# Patient Record
Sex: Female | Born: 1945
Health system: Southern US, Community
[De-identification: ages and names within clinical notes are randomized; demographics above are authoritative.]

## PROBLEM LIST (undated history)

## (undated) DIAGNOSIS — F29 Unspecified psychosis not due to a substance or known physiological condition: Secondary | ICD-10-CM

## (undated) DIAGNOSIS — M1990 Unspecified osteoarthritis, unspecified site: Secondary | ICD-10-CM

## (undated) DIAGNOSIS — E079 Disorder of thyroid, unspecified: Secondary | ICD-10-CM

## (undated) DIAGNOSIS — F32A Depression, unspecified: Secondary | ICD-10-CM

## (undated) DIAGNOSIS — K219 Gastro-esophageal reflux disease without esophagitis: Secondary | ICD-10-CM

## (undated) DIAGNOSIS — F03911 Unspecified dementia, unspecified severity, with agitation: Secondary | ICD-10-CM

## (undated) DIAGNOSIS — I10 Essential (primary) hypertension: Secondary | ICD-10-CM

## (undated) DIAGNOSIS — J302 Other seasonal allergic rhinitis: Secondary | ICD-10-CM

## (undated) DIAGNOSIS — F0390 Unspecified dementia without behavioral disturbance: Secondary | ICD-10-CM

## (undated) DIAGNOSIS — F419 Anxiety disorder, unspecified: Secondary | ICD-10-CM

## (undated) HISTORY — PX: ABDOMINAL HYSTERECTOMY: SHX81

---

## 2006-05-14 ENCOUNTER — Inpatient Hospital Stay (HOSPITAL_COMMUNITY): Admission: EM | Admit: 2006-05-14 | Discharge: 2006-05-17 | Payer: Self-pay | Admitting: Emergency Medicine

## 2009-02-09 ENCOUNTER — Ambulatory Visit (HOSPITAL_COMMUNITY): Admission: RE | Admit: 2009-02-09 | Discharge: 2009-02-09 | Payer: Self-pay | Admitting: Family Medicine

## 2009-02-11 ENCOUNTER — Ambulatory Visit (HOSPITAL_COMMUNITY): Admission: RE | Admit: 2009-02-11 | Discharge: 2009-02-11 | Payer: Self-pay | Admitting: Family Medicine

## 2010-04-11 ENCOUNTER — Emergency Department (HOSPITAL_COMMUNITY)
Admission: EM | Admit: 2010-04-11 | Discharge: 2010-04-11 | Payer: Self-pay | Source: Home / Self Care | Admitting: Emergency Medicine

## 2010-04-11 ENCOUNTER — Encounter: Payer: Self-pay | Admitting: Orthopedic Surgery

## 2010-04-13 ENCOUNTER — Ambulatory Visit
Admission: RE | Admit: 2010-04-13 | Discharge: 2010-04-13 | Payer: Self-pay | Source: Home / Self Care | Attending: Orthopedic Surgery | Admitting: Orthopedic Surgery

## 2010-04-13 ENCOUNTER — Encounter: Payer: Self-pay | Admitting: Orthopedic Surgery

## 2010-04-13 DIAGNOSIS — S42209A Unspecified fracture of upper end of unspecified humerus, initial encounter for closed fracture: Secondary | ICD-10-CM | POA: Insufficient documentation

## 2010-04-20 ENCOUNTER — Ambulatory Visit
Admission: RE | Admit: 2010-04-20 | Discharge: 2010-04-20 | Payer: Self-pay | Source: Home / Self Care | Attending: Orthopedic Surgery | Admitting: Orthopedic Surgery

## 2010-04-24 ENCOUNTER — Encounter: Payer: Self-pay | Admitting: Family Medicine

## 2010-04-28 ENCOUNTER — Ambulatory Visit
Admission: RE | Admit: 2010-04-28 | Discharge: 2010-04-28 | Payer: Self-pay | Source: Home / Self Care | Attending: Orthopedic Surgery | Admitting: Orthopedic Surgery

## 2010-04-28 ENCOUNTER — Encounter: Payer: Self-pay | Admitting: Orthopedic Surgery

## 2010-05-03 ENCOUNTER — Encounter (HOSPITAL_COMMUNITY)
Admission: RE | Admit: 2010-05-03 | Discharge: 2010-05-03 | Payer: Self-pay | Source: Home / Self Care | Attending: Orthopedic Surgery | Admitting: Orthopedic Surgery

## 2010-05-05 NOTE — Letter (Signed)
Summary: history form   history form   Imported By: Eugenio Hoes 04/13/2010 15:43:46  _____________________________________________________________________  External Attachment:    Type:   Image     Comment:   External Document

## 2010-05-05 NOTE — Medication Information (Signed)
Summary: Tax adviser   Imported By: Cammie Sickle 04/29/2010 10:39:08  _____________________________________________________________________  External Attachment:    Type:   Image     Comment:   External Document

## 2010-05-05 NOTE — Assessment & Plan Note (Signed)
Summary: 1 WK RECK LT SHOULDER XRAY/[facility]   Visit Type:  Follow-up Referring Provider:  [facility]  CC:  fx care shoulder.  History of Present Illness: I saw Paula Andrews in the office today for an initial visit.  She is a 65 years old woman with the complaint of:  left humerus fracture  DOI 04-11-10.  Xrays done at [facility] show left humeral neck and greater tuberosity fractures.  Medications:  Ibuprofen 600 mg, both given by the ER.  Today is one week recheck of the left shoulder with xrays.  Pain level around 2 with the medication.  Radiographs are taken today separately identifiable. X-ray report [facility], lateral, LEFT proximal humerus and proximal humerus fracture, which makes in the right ear for nonoperative treatment.  No change in position from previous x-ray.  Impression stable proximal humerus fracture.  Patient advised to continue sling. He'll come back in a week for x-rays if x-rays look good. Start physical therapy       Allergies: No Known Drug Allergies   Impression & Recommendations:  Problem # 1:  CLOSED FRACTURE UNSPEC PART UPPER END HUMERUS (ICD-812.00) Assessment Improved  Orders: Post-Op Check (16109) Shoulder x-ray,  minimum 2 views (60454)  Patient Instructions: 1)  Please schedule a follow-up appointment in 1 week. 2)  xrays in a week left shoulder    Orders Added: 1)  Post-Op Check [99024] 2)  Shoulder x-ray,  minimum 2 views [73030]

## 2010-05-05 NOTE — Assessment & Plan Note (Signed)
Summary: RE-CK/XRAY 1 WK LT SHOULDER/[facility]   Visit Type:  Follow-up Referring Provider:  [facility]  CC:  fx care left shoulder.  History of Present Illness: I saw Paula Andrews in the office today for a follow up visit.  She is a 65 years old woman with the complaint of:  left humerus fracture  DOI 04-11-10.  Xrays done at [facility] show left humeral neck and greater tuberosity fractures.  Medications:  Ibuprofen 600 mg  Today is one week recheck of the left shoulder with xrays after being in sling.  No pain other than with movement.  Separate and Identifiable X-Ray report      2 views, LEFT shoulder.  LEFT shoulder proximal humerus fracture.  Nondisplaced LEFT proximal humerus fractures in good position with excellent alignment.  Impression healing LEFT proximal humerus fracture, stable        Allergies (verified): No Known Drug Allergies   Impression & Recommendations:  Problem # 1:  CLOSED FRACTURE UNSPEC PART UPPER END HUMERUS (ICD-812.00) Assessment Improved  START PT   RETURN IN 6 WEEKS   Orders: Post-Op Check (14782) Shoulder x-ray,  minimum 2 views (95621)  Patient Instructions: 1)  START PT  2)  SLING AS NEEDED FOR PAIN CONTROL  3)  RETURN IN 6 WEEKS    Orders Added: 1)  Post-Op Check [99024] 2)  Shoulder x-ray,  minimum 2 views [73030]

## 2010-05-05 NOTE — Assessment & Plan Note (Signed)
Summary: [facility] FOL/UP/FX LT HUMERUS/XRAY [facility] 04/11/10/[facility]   Visit Type:  new patient Referring Provider:  [facility]  CC:  Left humeral fracture.  History of Present Illness: I saw Paula Andrews in the office today for an initial visit.  She is a 65 years old woman with the complaint of:  left humerus fracture  DOI 04-11-10.  Xrays done at [facility] show left humeral neck and greater tuberosity fractures.  Medications: Percocet 5 mg, Ibuprofen 600 mg, both given by the ER. She is scared to take the medication because the Morphine the hospital gave her made her sick.  Quality of pain, sharp, scale of 1-10 6. Timing intermittent onset secondary to injury. No specific improving factors, worse with movement. Other signs and symptoms of swelling and bruising  Allergies (verified): No Known Drug Allergies  Review of Systems Constitutional:  Denies weight loss, weight gain, fever, chills, and fatigue. Cardiovascular:  Denies chest pain, palpitations, fainting, and murmurs. Respiratory:  Complains of snoring; denies short of breath, wheezing, couch, tightness, pain on inspiration, and snoring . Gastrointestinal:  Denies heartburn, nausea, vomiting, diarrhea, constipation, and blood in your stools. Genitourinary:  Denies frequency, urgency, difficulty urinating, painful urination, flank pain, and bleeding in urine. Neurologic:  Denies numbness, tingling, unsteady gait, dizziness, tremors, and seizure. Musculoskeletal:  Complains of joint pain and swelling; denies instability, stiffness, redness, heat, and muscle pain. Endocrine:  Denies excessive thirst, exessive urination, and heat or cold intolerance. Psychiatric:  Complains of nervousness; denies depression, anxiety, and hallucinations. Skin:  Denies changes in the skin, poor healing, rash, itching, and redness. HEENT:  Denies blurred or double vision, eye pain, redness, and watering. Immunology:  Denies seasonal allergies, sinus problems,  and allergic to bee stings. Hemoatologic:  Denies easy bleeding and brusing.  Physical Exam  Additional Exam:  GEN: well developed, well nourished, normal grooming and hygiene, no deformity and normal body habitus.   CDV: pulses are normal, no edema, no erythema. no tenderness  Lymph: normal lymph nodes   Skin: no rashes, skin lesions or open sores   NEURO: normal coordination, reflexes, sensation.   Psyche: awake, alert and oriented. Mood normal   Gait: normal ambulation.  Inspection reveals normal hand function in terms of opening and closing the hand. Good capillary refill normal grip strength. The shoulder. Stability was deferred because of the fracture. She is tender in the proximal humeral area.  Her RIGHT upper extremity is normal with no malalignment normal range of motion muscle tone and no subluxation of the joint  Inspection    Impression & Recommendations:  Problem # 1:  CLOSED FRACTURE UNSPEC PART UPPER END HUMERUS (ICD-812.00)  The x-rays were done at [facility]. The report and the films have been reviewed. There is a proximal humerus fracture with a transverse fracture component, and a vertical fracture component of the greater tuberosity. Neither fracture fragment is greater than 1 cm displaced or 45 angulated. This meets the near criteria for nonoperative treatment.  The patient is encouraged to keep the sling on and keep the arm close to her body. No external rotation, abduction or forward elevation.  Return in one week repeat radiographs. If the radiographs show no change in the position of the fractured and open treatment internal fixation would be needed. This was explained  Orders: New Patient Level III (45409) Humeral Neck Fx (81191)  Patient Instructions: 1)  xrays left shoulder in 1 week [proximal humerus fracture]   Orders Added: 1)  New Patient  Level III [99203] 2)  Humeral Neck Fx [23600]

## 2010-05-05 NOTE — Miscellaneous (Signed)
Summary: Physical therapy order  Physical therapy order   Imported By: Cammie Sickle 04/29/2010 10:39:56  _____________________________________________________________________  External Attachment:    Type:   Image     Comment:   External Document

## 2010-05-06 ENCOUNTER — Ambulatory Visit (HOSPITAL_COMMUNITY)
Admission: RE | Admit: 2010-05-06 | Discharge: 2010-05-06 | Disposition: A | Discharge status: Home / Self Care | Payer: 59 | Source: Ambulatory Visit | Attending: Orthopedic Surgery | Admitting: Orthopedic Surgery

## 2010-05-06 DIAGNOSIS — M6281 Muscle weakness (generalized): Secondary | ICD-10-CM | POA: Insufficient documentation

## 2010-05-06 DIAGNOSIS — M25619 Stiffness of unspecified shoulder, not elsewhere classified: Secondary | ICD-10-CM | POA: Insufficient documentation

## 2010-05-06 DIAGNOSIS — M25629 Stiffness of unspecified elbow, not elsewhere classified: Secondary | ICD-10-CM | POA: Insufficient documentation

## 2010-05-06 DIAGNOSIS — IMO0001 Reserved for inherently not codable concepts without codable children: Secondary | ICD-10-CM | POA: Insufficient documentation

## 2010-05-06 DIAGNOSIS — M25519 Pain in unspecified shoulder: Secondary | ICD-10-CM | POA: Insufficient documentation

## 2010-05-09 ENCOUNTER — Ambulatory Visit (HOSPITAL_COMMUNITY)
Admission: RE | Admit: 2010-05-09 | Discharge: 2010-05-09 | Disposition: A | Discharge status: Home / Self Care | Payer: 59 | Source: Ambulatory Visit | Attending: Orthopedic Surgery | Admitting: Orthopedic Surgery

## 2010-05-09 DIAGNOSIS — M25519 Pain in unspecified shoulder: Secondary | ICD-10-CM | POA: Insufficient documentation

## 2010-05-09 DIAGNOSIS — M25629 Stiffness of unspecified elbow, not elsewhere classified: Secondary | ICD-10-CM | POA: Insufficient documentation

## 2010-05-09 DIAGNOSIS — M6281 Muscle weakness (generalized): Secondary | ICD-10-CM | POA: Insufficient documentation

## 2010-05-09 DIAGNOSIS — IMO0001 Reserved for inherently not codable concepts without codable children: Secondary | ICD-10-CM | POA: Insufficient documentation

## 2010-05-09 DIAGNOSIS — M25619 Stiffness of unspecified shoulder, not elsewhere classified: Secondary | ICD-10-CM | POA: Insufficient documentation

## 2010-05-11 ENCOUNTER — Ambulatory Visit (HOSPITAL_COMMUNITY)
Admission: RE | Admit: 2010-05-11 | Discharge: 2010-05-11 | Disposition: A | Discharge status: Home / Self Care | Payer: 59 | Source: Ambulatory Visit | Attending: Orthopedic Surgery | Admitting: Orthopedic Surgery

## 2010-05-11 DIAGNOSIS — M25529 Pain in unspecified elbow: Secondary | ICD-10-CM | POA: Insufficient documentation

## 2010-05-11 DIAGNOSIS — M25629 Stiffness of unspecified elbow, not elsewhere classified: Secondary | ICD-10-CM | POA: Insufficient documentation

## 2010-05-11 DIAGNOSIS — IMO0001 Reserved for inherently not codable concepts without codable children: Secondary | ICD-10-CM | POA: Insufficient documentation

## 2010-05-11 DIAGNOSIS — M25619 Stiffness of unspecified shoulder, not elsewhere classified: Secondary | ICD-10-CM | POA: Insufficient documentation

## 2010-05-11 DIAGNOSIS — M6281 Muscle weakness (generalized): Secondary | ICD-10-CM | POA: Insufficient documentation

## 2010-05-13 ENCOUNTER — Ambulatory Visit (HOSPITAL_COMMUNITY)
Admission: RE | Admit: 2010-05-13 | Discharge: 2010-05-13 | Disposition: A | Discharge status: Home / Self Care | Payer: 59 | Source: Ambulatory Visit | Admitting: Occupational Therapy

## 2010-05-16 ENCOUNTER — Encounter (HOSPITAL_COMMUNITY)
Admission: RE | Admit: 2010-05-16 | Discharge: 2010-05-16 | Disposition: A | Discharge status: Home / Self Care | Payer: 59 | Source: Ambulatory Visit | Attending: Orthopedic Surgery | Admitting: Orthopedic Surgery

## 2010-05-16 DIAGNOSIS — S42309D Unspecified fracture of shaft of humerus, unspecified arm, subsequent encounter for fracture with routine healing: Secondary | ICD-10-CM | POA: Insufficient documentation

## 2010-05-16 DIAGNOSIS — M6281 Muscle weakness (generalized): Secondary | ICD-10-CM | POA: Insufficient documentation

## 2010-05-16 DIAGNOSIS — IMO0001 Reserved for inherently not codable concepts without codable children: Secondary | ICD-10-CM | POA: Insufficient documentation

## 2010-05-16 DIAGNOSIS — M25519 Pain in unspecified shoulder: Secondary | ICD-10-CM | POA: Insufficient documentation

## 2010-05-18 ENCOUNTER — Ambulatory Visit (HOSPITAL_COMMUNITY)
Admission: RE | Admit: 2010-05-18 | Discharge: 2010-05-18 | Disposition: A | Discharge status: Home / Self Care | Payer: 59 | Source: Ambulatory Visit | Attending: Orthopedic Surgery | Admitting: Orthopedic Surgery

## 2010-05-20 ENCOUNTER — Ambulatory Visit (HOSPITAL_COMMUNITY)
Admission: RE | Admit: 2010-05-20 | Discharge: 2010-05-20 | Disposition: A | Discharge status: Home / Self Care | Payer: 59 | Source: Ambulatory Visit | Admitting: Specialist

## 2010-05-23 ENCOUNTER — Ambulatory Visit (HOSPITAL_COMMUNITY)
Admission: RE | Admit: 2010-05-23 | Discharge: 2010-05-23 | Disposition: A | Discharge status: Home / Self Care | Payer: 59 | Source: Ambulatory Visit | Attending: *Deleted | Admitting: *Deleted

## 2010-05-23 ENCOUNTER — Encounter: Payer: Self-pay | Admitting: Orthopedic Surgery

## 2010-05-25 ENCOUNTER — Ambulatory Visit (HOSPITAL_COMMUNITY)
Admission: RE | Admit: 2010-05-25 | Discharge: 2010-05-25 | Disposition: A | Discharge status: Home / Self Care | Payer: 59 | Source: Ambulatory Visit | Attending: *Deleted | Admitting: *Deleted

## 2010-05-27 ENCOUNTER — Ambulatory Visit (HOSPITAL_COMMUNITY)
Admission: RE | Admit: 2010-05-27 | Discharge: 2010-05-27 | Disposition: A | Discharge status: Home / Self Care | Payer: 59 | Source: Ambulatory Visit | Attending: *Deleted | Admitting: *Deleted

## 2010-05-30 ENCOUNTER — Ambulatory Visit (HOSPITAL_COMMUNITY)
Admission: RE | Admit: 2010-05-30 | Discharge: 2010-05-30 | Disposition: A | Discharge status: Home / Self Care | Payer: 59 | Source: Ambulatory Visit | Attending: *Deleted | Admitting: *Deleted

## 2010-05-31 NOTE — Miscellaneous (Signed)
Summary: OT clinical evaluation  OT clinical evaluation   Imported By: Jacklynn Ganong 05/24/2010 14:58:11  _____________________________________________________________________  External Attachment:    Type:   Image     Comment:   External Document

## 2010-06-01 ENCOUNTER — Encounter: Payer: Self-pay | Admitting: Orthopedic Surgery

## 2010-06-01 ENCOUNTER — Ambulatory Visit (HOSPITAL_COMMUNITY)
Admission: RE | Admit: 2010-06-01 | Discharge: 2010-06-01 | Disposition: A | Discharge status: Home / Self Care | Payer: 59 | Source: Ambulatory Visit | Attending: Orthopedic Surgery | Admitting: Orthopedic Surgery

## 2010-06-01 DIAGNOSIS — M6281 Muscle weakness (generalized): Secondary | ICD-10-CM | POA: Insufficient documentation

## 2010-06-01 DIAGNOSIS — IMO0001 Reserved for inherently not codable concepts without codable children: Secondary | ICD-10-CM | POA: Insufficient documentation

## 2010-06-01 DIAGNOSIS — M25619 Stiffness of unspecified shoulder, not elsewhere classified: Secondary | ICD-10-CM | POA: Insufficient documentation

## 2010-06-01 DIAGNOSIS — M25519 Pain in unspecified shoulder: Secondary | ICD-10-CM | POA: Insufficient documentation

## 2010-06-01 DIAGNOSIS — M25629 Stiffness of unspecified elbow, not elsewhere classified: Secondary | ICD-10-CM | POA: Insufficient documentation

## 2010-06-03 ENCOUNTER — Ambulatory Visit (HOSPITAL_COMMUNITY)
Admission: RE | Admit: 2010-06-03 | Discharge: 2010-06-03 | Disposition: A | Discharge status: Home / Self Care | Payer: 59 | Source: Ambulatory Visit | Attending: Orthopedic Surgery | Admitting: Orthopedic Surgery

## 2010-06-03 DIAGNOSIS — M25619 Stiffness of unspecified shoulder, not elsewhere classified: Secondary | ICD-10-CM | POA: Insufficient documentation

## 2010-06-03 DIAGNOSIS — M6281 Muscle weakness (generalized): Secondary | ICD-10-CM | POA: Insufficient documentation

## 2010-06-03 DIAGNOSIS — M25519 Pain in unspecified shoulder: Secondary | ICD-10-CM | POA: Insufficient documentation

## 2010-06-03 DIAGNOSIS — M25629 Stiffness of unspecified elbow, not elsewhere classified: Secondary | ICD-10-CM | POA: Insufficient documentation

## 2010-06-03 DIAGNOSIS — IMO0001 Reserved for inherently not codable concepts without codable children: Secondary | ICD-10-CM | POA: Insufficient documentation

## 2010-06-06 ENCOUNTER — Ambulatory Visit (HOSPITAL_COMMUNITY)
Admission: RE | Admit: 2010-06-06 | Discharge: 2010-06-06 | Disposition: A | Discharge status: Home / Self Care | Payer: 59 | Source: Ambulatory Visit | Attending: *Deleted | Admitting: *Deleted

## 2010-06-08 ENCOUNTER — Ambulatory Visit (HOSPITAL_COMMUNITY)
Admission: RE | Admit: 2010-06-08 | Discharge: 2010-06-08 | Disposition: A | Discharge status: Home / Self Care | Payer: 59 | Source: Ambulatory Visit | Attending: Orthopedic Surgery | Admitting: Orthopedic Surgery

## 2010-06-10 ENCOUNTER — Ambulatory Visit (HOSPITAL_COMMUNITY)
Admission: RE | Admit: 2010-06-10 | Discharge: 2010-06-10 | Disposition: A | Discharge status: Home / Self Care | Payer: 59 | Source: Ambulatory Visit | Admitting: Occupational Therapy

## 2010-06-13 ENCOUNTER — Ambulatory Visit (HOSPITAL_COMMUNITY)
Admission: RE | Admit: 2010-06-13 | Discharge: 2010-06-13 | Disposition: A | Discharge status: Home / Self Care | Payer: 59 | Source: Ambulatory Visit | Attending: Orthopedic Surgery | Admitting: Orthopedic Surgery

## 2010-06-14 ENCOUNTER — Encounter: Payer: Self-pay | Admitting: Orthopedic Surgery

## 2010-06-14 ENCOUNTER — Ambulatory Visit (INDEPENDENT_AMBULATORY_CARE_PROVIDER_SITE_OTHER): Payer: 59 | Admitting: Orthopedic Surgery

## 2010-06-14 DIAGNOSIS — S42209A Unspecified fracture of upper end of unspecified humerus, initial encounter for closed fracture: Secondary | ICD-10-CM

## 2010-06-14 NOTE — Miscellaneous (Signed)
Summary: OT Progress note  OT Progress note   Imported By: Jacklynn Ganong 06/07/2010 08:44:54  _____________________________________________________________________  External Attachment:    Type:   Image     Comment:   External Document

## 2010-06-15 ENCOUNTER — Ambulatory Visit (HOSPITAL_COMMUNITY): Payer: 59 | Admitting: Occupational Therapy

## 2010-06-17 ENCOUNTER — Ambulatory Visit (HOSPITAL_COMMUNITY): Payer: 59 | Admitting: Occupational Therapy

## 2010-06-21 NOTE — Assessment & Plan Note (Signed)
Summary: 6 WK RE-CK LT SHOULDER FOL'G PT/[facility]   Visit Type:  Follow-up Referring Provider:  [facility]  CC:  left shoulder pain.  History of Present Illness:   This is a 65 year old female who is following up for a fractured LEFT proximal humerus.  Fracture care followup chief complaint LEFT shoulder pain/fracture  Problem # 1:  CLOSED FRACTURE UNSPEC PART UPPER END HUMERUS (ICD-812.00) Assessment Improved  Date of injury:1.9.2012  Today is 6 week recheck shoulder after therapy.  She is doing better.  Has finished PT, ROM is better, still not full.  Pain level is 1 today.  No pain med needed.    Allergies: No Known Drug Allergies   Shoulder/Elbow Exam  General:    Well-developed, well-nourished, normal body habitus; no deformities, normal grooming.    Skin:    Intact, no scars, lesions, rashes, cafe au lait spots or bruising.    Inspection:    Inspection is normal.    Shoulder Exam:    Left:    Range of Motion:       Flexion-Active: 80       Flexion-Passive: 110       External Rotation : 20   Impression & Recommendations:  Problem # 1:  CLOSED FRACTURE UNSPEC PART UPPER END HUMERUS (ICD-812.00) Assessment Improved  Orders: Post-Op Check (57846)  Patient Instructions: 1)  Continue exercises at home. 2)  add pulley  3)  return in 6 weks for ROM check    Orders Added: 1)  Post-Op Check [96295]

## 2010-06-21 NOTE — Miscellaneous (Signed)
Summary: OT progress notes  OT progress notes   Imported By: Jacklynn Ganong 06/17/2010 11:05:02  _____________________________________________________________________  External Attachment:    Type:   Image     Comment:   External Document

## 2010-07-27 ENCOUNTER — Ambulatory Visit (INDEPENDENT_AMBULATORY_CARE_PROVIDER_SITE_OTHER): Payer: 59 | Admitting: Orthopedic Surgery

## 2010-07-27 DIAGNOSIS — S42209A Unspecified fracture of upper end of unspecified humerus, initial encounter for closed fracture: Secondary | ICD-10-CM

## 2010-07-27 NOTE — Patient Instructions (Signed)
continue pulley exercises

## 2010-07-27 NOTE — Progress Notes (Signed)
65 years old followup LEFT proximal humerus fracture.  Date of injury January 9  The patient is on a home exercise program after 6 weeks of physical therapy.  She exhibits 110 of forward elevation no pain  She is encouraged to continue pulley exercises and follow up as needed

## 2010-08-19 NOTE — H&P (Signed)
NAMEHARVEEN, FLESCH                ACCOUNT NO.:  [id number]   MEDICAL RECORD NO.:  [id number]          PATIENT TYPE:  INP   LOCATION:  A328                          FACILITY:  [facility]   PHYSICIAN:  Kingsley Callander. Ouida Sills, MD       DATE OF BIRTH:  Mar 05, 1946   DATE OF ADMISSION:  05/14/2006  DATE OF DISCHARGE:  LH                              HISTORY & PHYSICAL   CHIEF COMPLAINT:  Difficulty breathing.   HISTORY OF PRESENT ILLNESS:  This patient is a 65 year old white female  patient of Dr. Renard Matter who presented to the emergency room with  increased difficulty breathing today.  She had been experiencing a  nonproductive cough.  She had begun wheezing.  Her symptoms started 5  days ago.  She has developed a low-grade fever, she states.  On  presentation to the emergency room, she was found to be hypoxemic with  an oxygen saturation of 68%.  She was treated with albuterol and  prednisone prior to my arrival.  She denies any history of asthma or  emphysema.  She smokes a half-pack of cigarettes per day.   PAST MEDICAL HISTORY:  1. Hysterectomy.  2. Status post benign left breast lumpectomy.   MEDICATIONS:  Xanax 0.125 mg every night.   ALLERGIES:  None.   SOCIAL HISTORY:  She smokes a half pack of cigarettes per day.  She does  not consume alcohol.  She works in [place] and actually worked earlier  today.   FAMILY HISTORY:  Her mother had Alzheimer's disease.  Her father had  heart disease.   REVIEW OF SYSTEMS:  No chest pain, abdominal pain, change in bowel  habits, difficulty voiding.  She had an influenza vaccine this fall.   PHYSICAL EXAMINATION:  Temperature 98.4, pulse 102, respirations 20,  blood pressure 173/96.  GENERAL:  Minimally dyspneic white female.  HEENT:  TMs, eyes, nose and pharynx unremarkable.  NECK:  Supple without adenopathy or thyromegaly.  LUNGS:  Diminished breath sounds with bilateral wheezes.  HEART:  Regular with no murmurs.  ABDOMEN:  Nontender.  No  hepatosplenomegaly.  EXTREMITIES: No cyanosis, clubbing or edema.  NEUROLOGICAL:  Grossly intact.   LABORATORY DATA:  White count 8.9, hemoglobin 12.6, platelets 301,000,  73 segs, 17 lymphs.  Sodium 132, potassium 3.4, bicarb 35, glucose 142,  BUN 3, creatinine 0.46, calcium 8.6.  Her ABG on 2 liters of oxygen  revealed a pH of 7.44, pCO2 57 and pO2 of 61 with bicarb of 38.  Her  chest x-ray revealed moderate to severe changes of asthma and/or  bronchitis without localized consolidation per the radiologist.  Her EKG  revealed sinus tachycardia at 132 beats per minute with poor R-wave  progression.   IMPRESSION:  1. Chronic obstructive pulmonary disease exacerbation.  She has been      hypoxic.  She will require hospitalization for treatment with      Ventolin and Atrovent nebulizer treatments, IV Solu-Medrol,      Rocephin and supplemental oxygen.  2. Hypokalemia.  Will supplement orally.  3. Hypertension.  Will follow her  blood pressures and begin      antihypertensive therapy if systolic pressures remain above 140.  4. Hyponatremia.      Kingsley Callander. Ouida Sills, MD  Electronically Signed     ROF/MEDQ  D:  05/14/2006  T:  05/15/2006  Job:  [id number]   cc:   Angus G. Renard Matter, MD  Fax: [phone]

## 2010-08-19 NOTE — Group Therapy Note (Signed)
Paula Andrews                ACCOUNT NO.:  [id number]   MEDICAL RECORD NO.:  [id number]          PATIENT TYPE:  INP   LOCATION:  A328                          FACILITY:  [facility]   PHYSICIAN:  Angus G. Renard Matter, MD   DATE OF BIRTH:  1945-11-05   DATE OF PROCEDURE:  05/17/2006  DATE OF DISCHARGE:                                 PROGRESS NOTE   This patient was admitted with exacerbation of asthma, bronchitis, and  COPD.  She is still having some dyspnea and cough, but is feeling  generally better. She remains on IV antibiotics.   OBJECTIVE:  VITAL SIGNS:  Blood pressure 154/88, respirations 20, pulse  130, temperature 98.  LUNGS:  Rhonchi over lower lung field.  HEART:  Tachycardic.  ABDOMEN:  No palpable organs or masses.   ASSESSMENT:  The patient was admitted with bronchitis and chronic  obstructive pulmonary disease.   PLAN:  To continue current regimen.  Will change nebulizer treatments to  Xopenex.      Angus G. Renard Matter, MD  Electronically Signed     AGM/MEDQ  D:  05/17/2006  T:  05/17/2006  Job:  [id number]

## 2010-08-19 NOTE — Group Therapy Note (Signed)
NAMETACHE, BOBST                ACCOUNT NO.:  [id number]   MEDICAL RECORD NO.:  [id number]          PATIENT TYPE:  INP   LOCATION:  A328                          FACILITY:  [facility]   PHYSICIAN:  Angus G. Renard Matter, MD   DATE OF BIRTH:  [birth year]   DATE OF PROCEDURE:  DATE OF DISCHARGE:                                 PROGRESS NOTE   This patient was admitted through the ED with exacerbation of asthma and  bronchitis.  She is feeling some better but still having some dyspnea  and cough.   OBJECTIVE:  VITAL SIGNS:  Blood pressure 186/81, respirations 22, pulse  115, temp 97.7.  LUNGS:  Coarse breath sounds.  HEART:  Regular rhythm.  ABDOMEN:  No palpable organs or masses.   The patient's blood gas showed a pH of 7.440 with a pCO2 of 57, pO2 of  61.  Chemistry shows a sodium of 132, potassium 3.4, chloride 91, CO2  35, glucose 142, BUN 3.1.   X-ray of chest showed moderate severe changes of asthma.   ASSESSMENT:  The patient was admitted with exacerbation of chronic  obstructive pulmonary disease and asthma.   PLAN:  To continue current IV antibiotics, continue Solu-Medrol and neb  treatments.      Angus G. Renard Matter, MD  Electronically Signed     AGM/MEDQ  D:  05/15/2006  T:  05/15/2006  Job:  [id number]

## 2010-08-19 NOTE — Discharge Summary (Signed)
Paula Andrews, Paula Andrews                ACCOUNT NO.:  [id number]   MEDICAL RECORD NO.:  [id number]          PATIENT TYPE:  INP   LOCATION:  A328                          FACILITY:  [facility]   PHYSICIAN:  Angus G. Renard Matter, MD   DATE OF BIRTH:  09/02/1945   DATE OF ADMISSION:  05/14/2006  DATE OF DISCHARGE:  02/14/2008LH                               DISCHARGE SUMMARY   A 65 year old white female admitted 05/14/06 discharged 05/17/06, three  days' hospitalization.   DIAGNOSIS:  Exacerbation of COPD, hypokalemia, hypertension, hypoxemia.  The patient's condition stable at the time of her discharge.  This 3-  year-old white female presented to the ED with difficulty breathing. She  was experiencing nonproductive cough and some wheezing. Had developed a  low grade fever. On presentation to the emergency room she was found be  hypoxemic with oxygen saturation 68%. Was treated with albuterol and  prednisone prior to arrival. Does smoke half a pack of cigarettes per  day.   PHYSICAL EXAMINATION:  GENERAL:  Dyspneic white female with blood  pressure 173/96, respiration 20, pulse 102, temperature 98.4.  HEENT:  Eyes: PERRLA.  TMs negative.  Oropharynx benign.  NECK:  Supple.  No JVD  or thyroid abnormalities.  LUNGS:  Diminished breath sounds and wheezes  bilaterally.  HEART: Regular rhythm. No murmurs.  ABDOMEN: No palpable  organs or masses.  No hepatosplenomegaly.  EXTREMITIES:  Free of edema.  NEUROLOGICAL: Grossly intact.   LABORATORY DATA:  Admission CBC, WBC _____, hemoglobin 12.6, hematocrit  37.6, blood gases pH 7.440 with pCO2 57, pO2 61.0,  sodium 3.4, chloride  91, CO2 35, glucose 142, BUN 3, creatinine 0.46. X-rays, portable chest  x-ray moderate to severe changes of asthma, bronchitis without localized  consolidation.   HOSPITAL COURSE:  The patient at time of admission was placed on IV  Rocephin 1 gram daily, Solu-Medrol 125 mg every 6 hours. Neb treatments  every 4 hours, oxygen 2  liters. Regular diet.  Xanax 0.125 mg h.s.  [facility] standing orders. Habitrol patch was applied 21 mg daily.  The  patient slowly improved during hospitalization with neb treatments and  antibiotics.  She was less dyspneic by 05/17/06.  X-rays did show  evidence of asthma, chronic bronchitis. The patient was able to be  discharged on the third hospital day. The patient was discharged on  Xopenex HFA inhaler 2 puffs every 4 hours p.r.n., Levaquin 750 mg daily,  Medrol Dosepak 4 mg daily for 6 days. Mucinex 1200 mg b.i.d.  Codimol DH  1 teaspoon every 4 h p.r.n. for cough.  Xanax 0.125 mg p.r.n. The  patient was asked to return to the doctor's office for follow-up.      Angus G. Renard Matter, MD  Electronically Signed     AGM/MEDQ  D:  06/08/2006  T:  06/08/2006  Job:  [id number]

## 2010-08-19 NOTE — Group Therapy Note (Signed)
NAMESIERRAH, Paula Andrews                ACCOUNT NO.:  [id number]   MEDICAL RECORD NO.:  [id number]          PATIENT TYPE:  INP   LOCATION:  A328                          FACILITY:  [facility]   PHYSICIAN:  Angus G. Renard Matter, MD   DATE OF BIRTH:  1945-04-23   DATE OF PROCEDURE:  05/16/2006  DATE OF DISCHARGE:                                 PROGRESS NOTE   This patient was admitted through ED with exacerbation of asthma and  bronchitis, COPD.  She is feeling some better but still has some dyspnea  and cough.   OBJECTIVE:  VITAL SIGNS:  Blood pressure 166/83, respirations 22, pulse  103, temp 98.4, O2 sats 87%.  HEART:  Tachycardia intermittently.  LUNGS:  Diminished breath sounds.  Occasional rhonchus.  ABDOMEN:  No palpable organs or masses.   ASSESSMENT:  Patient was admitted with bronchitis, asthma, chronic  obstructive pulmonary disease.   PLAN:  To continue current IV antibiotics.  Continue neb treatments.      Angus G. Renard Matter, MD  Electronically Signed     AGM/MEDQ  D:  05/16/2006  T:  05/16/2006  Job:  [id number]

## 2011-07-12 ENCOUNTER — Emergency Department (HOSPITAL_COMMUNITY)
Admission: EM | Admit: 2011-07-12 | Discharge: 2011-07-12 | Disposition: A | Discharge status: Home / Self Care | Payer: Medicare Other | Attending: Emergency Medicine | Admitting: Emergency Medicine

## 2011-07-12 ENCOUNTER — Encounter (HOSPITAL_COMMUNITY): Payer: Self-pay | Admitting: *Deleted

## 2011-07-12 ENCOUNTER — Emergency Department (HOSPITAL_COMMUNITY): Payer: Medicare Other

## 2011-07-12 DIAGNOSIS — L02419 Cutaneous abscess of limb, unspecified: Secondary | ICD-10-CM | POA: Insufficient documentation

## 2011-07-12 DIAGNOSIS — J3489 Other specified disorders of nose and nasal sinuses: Secondary | ICD-10-CM | POA: Insufficient documentation

## 2011-07-12 DIAGNOSIS — R21 Rash and other nonspecific skin eruption: Secondary | ICD-10-CM | POA: Insufficient documentation

## 2011-07-12 DIAGNOSIS — M542 Cervicalgia: Secondary | ICD-10-CM | POA: Insufficient documentation

## 2011-07-12 DIAGNOSIS — R609 Edema, unspecified: Secondary | ICD-10-CM | POA: Insufficient documentation

## 2011-07-12 DIAGNOSIS — L0390 Cellulitis, unspecified: Secondary | ICD-10-CM

## 2011-07-12 DIAGNOSIS — L989 Disorder of the skin and subcutaneous tissue, unspecified: Secondary | ICD-10-CM | POA: Diagnosis not present

## 2011-07-12 DIAGNOSIS — M7989 Other specified soft tissue disorders: Secondary | ICD-10-CM | POA: Diagnosis not present

## 2011-07-12 DIAGNOSIS — L03119 Cellulitis of unspecified part of limb: Secondary | ICD-10-CM | POA: Diagnosis not present

## 2011-07-12 HISTORY — DX: Disorder of thyroid, unspecified: E07.9

## 2011-07-12 LAB — BASIC METABOLIC PANEL
BUN: 12 mg/dL (ref 6–23)
Chloride: 99 mEq/L (ref 96–112)
GFR calc Af Amer: >90 mL/min (ref >90)
Glucose, Bld: 112 mg/dL — ABNORMAL HIGH (ref 70–99)

## 2011-07-12 LAB — CBC
HCT: 54.1 % — ABNORMAL HIGH (ref 36.0–46.0)
Hemoglobin: 16.1 g/dL — ABNORMAL HIGH (ref 12.0–15.0)
MCH: 25.4 pg — ABNORMAL LOW (ref 26.0–34.0)
MCHC: 29.8 g/dL — ABNORMAL LOW (ref 30.0–36.0)
MCV: 85.5 fL (ref 78.0–100.0)
Platelets: 269 10*3/uL (ref 150–400)
RBC: 6.33 MIL/uL — ABNORMAL HIGH (ref 3.87–5.11)
WBC: 9 10*3/uL (ref 4.0–10.5)

## 2011-07-12 MED ORDER — SODIUM CHLORIDE 0.9 % IV BOLUS (SEPSIS)
250.0000 mL | Freq: Once | INTRAVENOUS | Status: AC
  Administered 2011-07-12: 250 mL via INTRAVENOUS

## 2011-07-12 MED ORDER — VANCOMYCIN HCL IN DEXTROSE 1-5 GM/200ML-% IV SOLN
1000.0000 mg | Freq: Once | INTRAVENOUS | Status: AC
  Administered 2011-07-12: 1000 mg via INTRAVENOUS
  Filled 2011-07-12: qty 200

## 2011-07-12 MED ORDER — DOXYCYCLINE HYCLATE 100 MG PO CAPS: 100.0000 mg | ORAL_CAPSULE | Freq: Two times a day (BID) | ORAL | Status: AC

## 2011-07-12 MED ORDER — SODIUM CHLORIDE 0.9 % IV SOLN: INTRAVENOUS | Status: DC

## 2011-07-12 NOTE — Discharge Instructions (Signed)
Cellulitis Cellulitis is an infection of the tissue under the skin. The infected area is usually red and tender. This is caused by germs. These germs enter the body through cuts or sores. This usually happens in the arms or lower legs. HOME CARE   Take your medicine as told. Finish it even if you start to feel better.   If the infection is on the arm or leg, keep it raised (elevated).   Use a warm cloth on the infected area several times a day.   See your doctor for a follow-up visit as told.  GET HELP RIGHT AWAY IF:   You are tired or confused.   You throw up (vomit).   You have watery poop (diarrhea).   You feel ill and have muscle aches.   You have a fever.  MAKE SURE YOU:   Understand these instructions.   Will watch your condition.   Will get help right away if you are not doing well or get worse.  Document Released: 09/06/2007 Document Revised: 03/09/2011 Document Reviewed: 02/19/2009 [facility] Patient Information 2012 [place], [place].  Elevate legs as much as possible. Take antibiotic as directed. Make appointment to followup with Dr. Megan Mans in the next 2 days. Return if worse. Return for any new or worse symptoms.

## 2011-07-12 NOTE — ED Notes (Signed)
Swelling of both legs for over a week and states she is sleeping more than usual

## 2011-07-12 NOTE — ED Provider Notes (Signed)
History    This chart was scribed for Paula Jakes, MD by Brooks Sailors. The patient was seen in room APA04/APA04. Patient's care was started at 1201.  CSN: [id number]  Arrival date & time 07/12/11  1201   First MD Initiated Contact with Patient 07/12/11 1244      Chief Complaint  Patient presents with  . Leg Swelling    (Consider location/radiation/quality/duration/timing/severity/associated sxs/prior treatment) HPI  Paula Andrews is a 66 y.o. female who presents to the Emergency Department complaining of constant severe swelling and redness of the BLE and persistent since. Onset of swelling was a 3 weeks ago and redness a few days ago. Patient notes multiple abrasions on both legs from new puppy, before it was up to date on shots. Patient denies a similar history of pain. Patient with h/o of thyroid disease.     Past Medical History  Diagnosis Date  . Thyroid disease     Past Surgical History  Procedure Date  . Abdominal hysterectomy     No family history on file.  History  Substance Use Topics  . Smoking status: Never Smoker   . Smokeless tobacco: Not on file  . Alcohol Use: No    OB History    Grav Para Term Preterm Abortions TAB SAB Ect Mult Living                  Review of Systems  Constitutional: Negative for fever and chills.  HENT: Positive for congestion and neck pain. Negative for sore throat.   Respiratory: Positive for cough. Negative for shortness of breath.   Cardiovascular: Positive for leg swelling (bilateral). Negative for chest pain.  Gastrointestinal: Negative for nausea, vomiting, abdominal pain and diarrhea.  Genitourinary: Negative for frequency.  Musculoskeletal: Negative for back pain.  Skin: Positive for rash (Lower extremities).  All other systems reviewed and are negative.    Allergies  Review of patient's allergies indicates no known allergies.  Home Medications   Current Outpatient Rx  Name Route Sig Dispense  Refill  . VITAMIN C 1000 MG PO TABS Oral Take 1,000 mg by mouth daily.    Marland Kitchen CALCIUM-VITAMIN D 600-200 MG-UNIT PO TABS Oral Take 1 tablet by mouth daily.    . FENOFIBRATE 145 MG PO TABS Oral Take 145 mg by mouth daily.    . IBUPROFEN 200 MG PO TABS Oral Take 200 mg by mouth every 6 (six) hours as needed. Pain    . LEVOTHYROXINE SODIUM 25 MCG PO TABS Oral Take 25 mcg by mouth daily.    Marland Kitchen OVER THE COUNTER MEDICATION Both Eyes Place 1 drop into both eyes daily as needed. Eye Drop used for allergies    . DOXYCYCLINE HYCLATE 100 MG PO CAPS Oral Take 1 capsule (100 mg total) by mouth 2 (two) times daily. 14 capsule 0    BP 168/75  Pulse 74  Temp(Src) 97.8 F (36.6 C) (Oral)  Resp 18  Ht 5\' 4"  (1.626 m)  Wt 169 lb (76.658 kg)  BMI 29.01 kg/m2  Physical Exam  Nursing note and vitals reviewed. Constitutional: She is oriented to person, place, and time. She appears well-developed and well-nourished. No distress.  HENT:  Head: Normocephalic and atraumatic.  Eyes: EOM are normal. Pupils are equal, round, and reactive to light.  Neck: Neck supple. No tracheal deviation present.  Cardiovascular: Normal rate, regular rhythm and normal heart sounds.  Exam reveals no gallop and no friction rub.   No murmur  heard. Pulses:      Dorsalis pedis pulses are 2+ on the right side, and 2+ on the left side.  Pulmonary/Chest: Effort normal. No respiratory distress. She has no wheezes. She has no rales.  Abdominal: Soft. Bowel sounds are normal. She exhibits no distension. There is no tenderness.  Musculoskeletal: Normal range of motion.  Neurological: She is alert and oriented to person, place, and time. No cranial nerve deficit or sensory deficit. She exhibits normal muscle tone. Coordination normal.  Skin: Skin is warm and dry. Rash noted.       Cellulitis of the lower extremities 2/3s up the leg with swelling, Multiple bilateral skin lesions. Cap refill strong, 1+ pitting edema, Increased warmness.     Psychiatric: She has a normal mood and affect. Her behavior is normal.    ED Course  Procedures (including critical care time) DIAGNOSTIC STUDIES:   COORDINATION OF CARE: 1:15PM- Patient informed of current plan for treatment and evaluation and agrees with plan at this time.     Labs Reviewed  CBC - Abnormal; Notable for the following:    RBC 6.33 (*)    Hemoglobin 16.1 (*)    HCT 54.1 (*)    MCH 25.4 (*)    MCHC 29.8 (*)    RDW 16.0 (*)    All other components within normal limits  BASIC METABOLIC PANEL - Abnormal; Notable for the following:    Glucose, Bld 112 (*)    All other components within normal limits   Dg Chest 2 View  07/12/2011  *RADIOLOGY REPORT*  Clinical Data: Swelling of the extremities  CHEST - 2 VIEW  Comparison: 05/14/2006  Findings: The heart is enlarged.  There is calcification of the thoracic aorta.  There is bronchial thickening and there are prominent interstitial markings in the lower lungs, probably related chronic bronchitis.  There is no evidence of pulmonary edema or pleural effusion.  No focal lesion.  No significant bony finding.  IMPRESSION: No evidence of heart failure by chest radiography.  Prominent bronchial thickening and lower lobe interstitial markings probably indicating chronic bronchitis.  Original Report Authenticated By: Thomasenia Sales, M.D.     1. Cellulitis     Results for orders placed during the hospital encounter of 07/12/11  CBC      Component Value Range   WBC 9.0  4.0 - 10.5 (K/uL)   RBC 6.33 (*) 3.87 - 5.11 (MIL/uL)   Hemoglobin 16.1 (*) 12.0 - 15.0 (g/dL)   HCT 16.1 (*) 09.6 - 46.0 (%)   MCV 85.5  78.0 - 100.0 (fL)   MCH 25.4 (*) 26.0 - 34.0 (pg)   MCHC 29.8 (*) 30.0 - 36.0 (g/dL)   RDW 04.5 (*) 40.9 - 15.5 (%)   Platelets 269  150 - 400 (K/uL)  BASIC METABOLIC PANEL      Component Value Range   Sodium 139  135 - 145 (mEq/L)   Potassium 4.1  3.5 - 5.1 (mEq/L)   Chloride 99  96 - 112 (mEq/L)   CO2 32  19 - 32  (mEq/L)   Glucose, Bld 112 (*) 70 - 99 (mg/dL)   BUN 12  6 - 23 (mg/dL)   Creatinine, Ser 8.11  0.50 - 1.10 (mg/dL)   Calcium 9.2  8.4 - 91.4 (mg/dL)   GFR calc non Af Amer >90  >90 (mL/min)   GFR calc Af Amer >90  >90 (mL/min)      MDM  Cellulitis of both lower legs  these being treated with vancomycin IV in the emergency apartment based on patient's labs chest x-ray no other significant findings no evidence of any renal insufficiency or sniffing and leukocytosis patient will follow primary care provider next 2 days will take doxycycline antibiotic at home. Patient will return for any new or worse symptoms.      I personally performed the services described in this documentation, which was scribed in my presence. The recorded information has been reviewed and considered.     Paula Jakes, MD 07/12/11 1425

## 2011-07-12 NOTE — ED Notes (Signed)
States she stopped taking her medication recently. States it was not helping her

## 2011-07-12 NOTE — ED Notes (Signed)
Pt not ready to discharge d/t med administration of Vanc x1 hour.

## 2011-07-12 NOTE — ED Notes (Signed)
Pt c/o swelling and redness to lower ext bilat. Pt states she was placed on cholesterol and thyroid meds about 3 months ago and a month ago her feet began to swell. Pt states she stopped taking her meds about a week later and has seen no improvement with swelling. Pt states she has also been having trouble staying awake. She says this has been going on for a year with no improvement.

## 2011-07-14 DIAGNOSIS — R0902 Hypoxemia: Secondary | ICD-10-CM | POA: Diagnosis not present

## 2011-07-14 DIAGNOSIS — L0291 Cutaneous abscess, unspecified: Secondary | ICD-10-CM | POA: Diagnosis not present

## 2011-07-15 ENCOUNTER — Encounter (HOSPITAL_COMMUNITY): Payer: Self-pay | Admitting: *Deleted

## 2011-07-15 ENCOUNTER — Ambulatory Visit (HOSPITAL_COMMUNITY)
Admission: RE | Admit: 2011-07-15 | Discharge: 2011-07-15 | Disposition: A | Discharge status: Home / Self Care | Payer: Medicare Other | Source: Ambulatory Visit | Attending: Family Medicine | Admitting: Family Medicine

## 2011-07-15 ENCOUNTER — Emergency Department (HOSPITAL_COMMUNITY)
Admission: EM | Admit: 2011-07-15 | Discharge: 2011-07-15 | Disposition: A | Discharge status: Home / Self Care | Payer: Medicare Other | Attending: Emergency Medicine | Admitting: Emergency Medicine

## 2011-07-15 ENCOUNTER — Emergency Department (HOSPITAL_COMMUNITY): Payer: Medicare Other

## 2011-07-15 DIAGNOSIS — E7800 Pure hypercholesterolemia, unspecified: Secondary | ICD-10-CM | POA: Diagnosis not present

## 2011-07-15 DIAGNOSIS — J984 Other disorders of lung: Secondary | ICD-10-CM | POA: Diagnosis not present

## 2011-07-15 DIAGNOSIS — Z79899 Other long term (current) drug therapy: Secondary | ICD-10-CM | POA: Diagnosis not present

## 2011-07-15 DIAGNOSIS — R0902 Hypoxemia: Secondary | ICD-10-CM | POA: Insufficient documentation

## 2011-07-15 DIAGNOSIS — I2510 Atherosclerotic heart disease of native coronary artery without angina pectoris: Secondary | ICD-10-CM | POA: Diagnosis not present

## 2011-07-15 DIAGNOSIS — E079 Disorder of thyroid, unspecified: Secondary | ICD-10-CM | POA: Insufficient documentation

## 2011-07-15 LAB — BLOOD GAS, ARTERIAL
Bicarbonate: 30.7 mEq/L — ABNORMAL HIGH (ref 20.0–24.0)
O2 Saturation: 82.9 %
O2 Saturation: 80.5 %
Patient temperature: 37
Patient temperature: 37
TCO2: 26.4 mmol/L (ref 0–100)
TCO2: 27.3 mmol/L (ref 0–100)
pCO2 arterial: 53.6 mmHg — ABNORMAL HIGH (ref 35.0–45.0)

## 2011-07-15 MED ORDER — ALBUTEROL SULFATE HFA 108 (90 BASE) MCG/ACT IN AERS
2.0000 | INHALATION_SPRAY | Freq: Four times a day (QID) | RESPIRATORY_TRACT | Status: DC
  Administered 2011-07-15: 2 via RESPIRATORY_TRACT
  Filled 2011-07-15: qty 6.7

## 2011-07-15 MED ORDER — CLONIDINE HCL 0.1 MG PO TABS
0.1000 mg | ORAL_TABLET | Freq: Once | ORAL | Status: AC
  Administered 2011-07-15: 0.1 mg via ORAL
  Filled 2011-07-15: qty 1

## 2011-07-15 MED ORDER — SODIUM CHLORIDE 0.9 % IV SOLN
INTRAVENOUS | Status: DC
  Administered 2011-07-15: 14:00:00 via INTRAVENOUS

## 2011-07-15 MED ORDER — IOHEXOL 350 MG/ML SOLN
100.0000 mL | Freq: Once | INTRAVENOUS | Status: AC | PRN
  Administered 2011-07-15: 100 mL via INTRAVENOUS

## 2011-07-15 MED ORDER — AEROCHAMBER Z-STAT PLUS/MEDIUM MISC
1.0000 | Freq: Once | Status: AC
  Administered 2011-07-15: 1

## 2011-07-15 NOTE — ED Notes (Signed)
Pt came in for outpatient blood gas this afternoon. Pt sent to ED to be evaluated for PO2 50.

## 2011-07-15 NOTE — ED Notes (Signed)
[facility] to come and set pt up with portable o2, family aware and no complaints voiced at this time.

## 2011-07-15 NOTE — Discharge Instructions (Signed)
Wear the oxygen at 2 liters per minute nasal canula. Let Dr Renard Matter know on Monday that you were put on the oxygen this weekend. Return to the ED if you feel worse.   Hypoxemia Hypoxemia occurs when your blood does not have enough oxygen. The body cannot work well when it does not have enough oxygen because every part of your body needs oxygen. Oxygen travels to all parts of the body through your blood. Hypoxemia can develop suddenly or can come on slowly. CAUSES  Long-term (chronic) lung diseases (chronic obstructive pulmonary disease [COPD], pulmonary fibrosis, or interstitial lung disease).   A condition in which there is a pause in your breathing (sleep apnea).   Fluid buildup in your lungs (pulmonary edema).   Lung infection (pneumonia).   Lung or throat cancer.   Certain diseasesthat affect nerves or muscles.   A collapsed lung (pneumothorax).   A blood clot in the lungs (pulmonary embolus).   Low levels of red blood cells (anemia).   Poor circulation.   Slow or shallow breathing (hypoventilation).   Certain medicines.   High altitudes.   Toxic chemicals and gases.  SYMPTOMS Symptoms vary greatly and depend on the cause. How fast the hypoxemia develops matters, too. Symptoms are often very clear when it comes on quickly. It can be hard to notice symptoms when hypoxemia develops very slowly. Symptoms can include:  Shortness of breath (dyspnea).   Bluish color of the skin, lips, or nail beds.   Breathing that is fast, noisy, or shallow.   A fast heartbeat.   Feeling tired or sleepy.   Being confused or feeling anxious.  DIAGNOSIS To decide if you have hypoxemia, your caregiver may perform:  A physical exam.   Blood tests.   A pulse oximetry. A sensor will be put on your finger, toe, or earlobe to measure the percent of oxygen in your blood.   Imaging tests (X-rays, CT scans).   An electrocardiogram (EKG).   An echocardiogram.  TREATMENT Treatment  depends on what is causing your condition. You may be put on oxygen therapy which gives you extra oxygen. Oxygen therapy may last just until the cause can be found and then other treatment would begin. For some people, however, oxygen is needed for a long time. HOME CARE INSTRUCTIONS What you need to do at home will vary from person to person. It will depend on your treatment plan, but everyone with hypoxemia should follow these directions.  Take all medicines directed by your caregiver. Only take medicines that are approved by your caregiver.   Follow oxygen safety measures.   Always have a backup supply of oxygen.   Do not smoke around oxygen.   Handle the oxygen tanks carefully and as instructed.   If you smoke, quit. Stay away from people who smoke.   Eat a healthy diet. Try eating more times a day, but eat less each time.   Prevent infections by getting routine vaccinations, avoiding those who are ill, and following good hygiene practices.   Get plenty of sleep.   Look for ways to save your energy.   Plan activities for the time of day when your energy level is high.   Arrange for help with daily activities as needed.   Pay attention to your mental health. Manage stress and get help if you feel anxious or depressed.   Keep all follow-up appointments with your caregivers.  SEEK MEDICAL CARE IF:  You have any questions or concerns  about your oxygen therapy.   You have questions about your treatment.   You still have trouble breathing.   You become short of breath when you exercise.   You are tired when you wake up.   You have a headache when you wake up.  SEEK IMMEDIATE MEDICAL CARE IF:   Your breathing gets worse.   You have shortness of breath with normal activity.   You have a bluish color of the skin, lips, or nail beds.   You feel very tired.   You become confused.   You cough up dark mucus.   You have chest pain.   You have a fever.  Document  Released: 10/03/2010 Document Revised: 03/09/2011 Document Reviewed: 10/03/2010 [facility] Patient Information 2012 [place], [place].

## 2011-07-15 NOTE — ED Notes (Signed)
Resp called and advised critical ABG values as follows PH 7.357, PCO2 57.9, PO2 46.6, BiCarb 31.7, O2 Sat 80.5%.   MD aware

## 2011-07-15 NOTE — ED Notes (Signed)
Family at bedside. Patient is comfortable at this time. 

## 2011-07-15 NOTE — ED Provider Notes (Cosign Needed)
History     CSN: [id number]  Arrival date & time 07/15/11  1302   First MD Initiated Contact with Patient 07/15/11 1303      Chief Complaint  Patient presents with  . Abnormal Lab    (Consider location/radiation/quality/duration/timing/severity/associated sxs/prior treatment) HPI  Patient reports she was seen in the ER 3 days ago for cellulitis of her legs. They relate to have a new puppy and believe he scratched her legs which started the cellulitis.  She relates at that time they were in the ED they noted her pulse ox was 88% on room air. She denies shortness of breath, dyspnea on exertion, cough, or chest pain. She was seen yesterday by Dr. Renard Matter for followup and he ordered a arterial blood gas to be done today. When those results returned she was told to come to the emergency department.  PCP Dr. Renard Matter  Past Medical History  Diagnosis Date  . Thyroid disease   high cholesterol  Past Surgical History  Procedure Date  . Abdominal hysterectomy     History reviewed. No pertinent family history.  History  Substance Use Topics  . Smoking status: Quit smoking in 2006   . Smokeless tobacco: Not on file  . Alcohol Use: No  lives at home Lives with spouse  OB History    Grav Para Term Preterm Abortions TAB SAB Ect Mult Living                  Review of Systems  All other systems reviewed and are negative.    Allergies  Review of patient's allergies indicates no known allergies.  Home Medications   Current Outpatient Rx  Name Route Sig Dispense Refill  . VITAMIN C 1000 MG PO TABS Oral Take 1,000 mg by mouth daily.    Marland Kitchen CALCIUM-VITAMIN D 600-200 MG-UNIT PO TABS Oral Take 1 tablet by mouth daily.    Marland Kitchen DOXYCYCLINE HYCLATE 100 MG PO CAPS Oral Take 1 capsule (100 mg total) by mouth 2 (two) times daily. 14 capsule 0  . FENOFIBRATE 145 MG PO TABS Oral Take 145 mg by mouth daily.    . IBUPROFEN 200 MG PO TABS Oral Take 200 mg by mouth every 6 (six) hours as needed.  Pain    . LEVOTHYROXINE SODIUM 25 MCG PO TABS Oral Take 25 mcg by mouth daily.    Marland Kitchen OVER THE COUNTER MEDICATION Both Eyes Place 1 drop into both eyes daily as needed. Eye Drop used for allergies      BP 187/97  Pulse 72  Temp(Src) 97.5 F (36.4 C) (Oral)  Resp 22  Ht 5' 4.5" (1.638 m)  Wt 159 lb (72.122 kg)  BMI 26.87 kg/m2  SpO2 88%  Vital signs normal except hypotensive, hypoxic   Physical Exam  Nursing note and vitals reviewed. Constitutional: She is oriented to person, place, and time. She appears well-developed and well-nourished.  Non-toxic appearance. She does not appear ill. No distress.  HENT:  Head: Normocephalic and atraumatic.  Right Ear: External ear normal.  Left Ear: External ear normal.  Nose: Nose normal. No mucosal edema or rhinorrhea.  Mouth/Throat: Oropharynx is clear and moist and mucous membranes are normal. No dental abscesses or uvula swelling.  Eyes: Conjunctivae and EOM are normal. Pupils are equal, round, and reactive to light.  Neck: Normal range of motion and full passive range of motion without pain. Neck supple.  Cardiovascular: Normal rate, regular rhythm and normal heart sounds.  Exam reveals no  gallop and no friction rub.   No murmur heard. Pulmonary/Chest: Effort normal and breath sounds normal. No respiratory distress. She has no wheezes. She has no rhonchi. She has no rales. She exhibits no tenderness and no crepitus.  Abdominal: Soft. Normal appearance and bowel sounds are normal. She exhibits no distension. There is no tenderness. There is no rebound and no guarding.  Musculoskeletal: Normal range of motion. She exhibits edema. She exhibits no tenderness.       Moves all extremities well. Patient has firm edema of both lower legs. She has mild diffuse redness of the left lower leg. There is mild warmness of her legs.  Neurological: She is alert and oriented to person, place, and time. She has normal strength. No cranial nerve deficit.  Skin:  Skin is warm, dry and intact. No rash noted. No erythema. No pallor.       Patient's color is normal with redness of her palms, her cheeks are reddened and her lips are red they are not dusky or bluish tinted.  Psychiatric: She has a normal mood and affect. Her speech is normal and behavior is normal. Her mood appears not anxious.    ED Course  Procedures (including critical care time)  I checked patient's pulse ox and it was 86-88% even when I checked it on her toes. I used myself as a control and I was 99-100%. With the patient's swollen legs and hypoxia I felt we needed to pursue a CT scan of her chest to rule out PE  Tech reports her Pulse ox was 91% and when she ambulated it dropped to 89%.  When I went into the room after her using the albuterol inhaler her pulse ox dropped to 79% on RA and her BP elevated to 200 systolic. She was given clonidine for her BP.   16:49 Dr Janna Arch, states she doesn't meet criteria for home oxygen, pulse ox needs to be below 88%. Put on inhaler and have her follow-up with Dr Renard Matter this week.   18:30 Dr Janna Arch states to contact [facility] or [facility] to get her started on oxygen this weekend.   PT received portable oxygen in the ED and she will get oxygen in her home tonight.   Results for orders placed during the hospital encounter of 07/15/11  BLOOD GAS, ARTERIAL      Component Value Range   FIO2 0.21     pH, Arterial 7.357  7.350 - 7.400    pCO2 arterial 57.9 (*) 35.0 - 45.0 (mmHg)   pO2, Arterial 46.6 (*) 80.0 - 100.0 (mmHg)   Bicarbonate 31.7 (*) 20.0 - 24.0 (mEq/L)   TCO2 27.3  0 - 100 (mmol/L)   Acid-Base Excess 6.3 (*) 0.0 - 2.0 (mmol/L)   O2 Saturation 80.5     Patient temperature 37.0     Collection site RIGHT RADIAL     Drawn by COLLECTED BY RT     Sample type ARTERIAL     Allens test (pass/fail) PASS  PASS    Laboratory interpretation hypoxia.  ABG done as outpatient showed pH 7.377, pCP253.6, p02 50.5, pO2  50.5   Dg Chest 2 View  07/12/2011  *RADIOLOGY REPORT*  Clinical Data: Swelling of the extremities  CHEST - 2 VIEW  Comparison: 05/14/2006  Findings: The heart is enlarged.  There is calcification of the thoracic aorta.  There is bronchial thickening and there are prominent interstitial markings in the lower lungs, probably related chronic bronchitis.  There is no evidence of pulmonary edema or pleural effusion.  No focal lesion.  No significant bony finding.  IMPRESSION: No evidence of heart failure by chest radiography.  Prominent bronchial thickening and lower lobe interstitial markings probably indicating chronic bronchitis.  Original Report Authenticated By: Thomasenia Sales, M.D.      1. Hypoxia    Plan discharge to get home oxygen  Devoria Albe, MD, FACEP    MDM          Ward Givens, MD 07/15/11 2133

## 2011-07-17 ENCOUNTER — Encounter (HOSPITAL_COMMUNITY): Payer: Self-pay | Admitting: *Deleted

## 2011-07-17 ENCOUNTER — Emergency Department (HOSPITAL_COMMUNITY)
Admission: EM | Admit: 2011-07-17 | Discharge: 2011-07-17 | Disposition: A | Discharge status: Home / Self Care | Payer: Medicare Other | Attending: Emergency Medicine | Admitting: Emergency Medicine

## 2011-07-17 DIAGNOSIS — L539 Erythematous condition, unspecified: Secondary | ICD-10-CM | POA: Insufficient documentation

## 2011-07-17 DIAGNOSIS — R0902 Hypoxemia: Secondary | ICD-10-CM | POA: Insufficient documentation

## 2011-07-17 DIAGNOSIS — R5381 Other malaise: Secondary | ICD-10-CM | POA: Insufficient documentation

## 2011-07-17 DIAGNOSIS — R0689 Other abnormalities of breathing: Secondary | ICD-10-CM

## 2011-07-17 DIAGNOSIS — R5383 Other fatigue: Secondary | ICD-10-CM | POA: Insufficient documentation

## 2011-07-17 DIAGNOSIS — J449 Chronic obstructive pulmonary disease, unspecified: Secondary | ICD-10-CM | POA: Diagnosis not present

## 2011-07-17 DIAGNOSIS — R0609 Other forms of dyspnea: Secondary | ICD-10-CM | POA: Diagnosis not present

## 2011-07-17 LAB — BLOOD GAS, ARTERIAL
Patient temperature: 37
TCO2: 30.8 mmol/L (ref 0–100)
pCO2 arterial: 66.4 mmHg (ref 35.0–45.0)
pH, Arterial: 7.348 — ABNORMAL LOW (ref 7.350–7.400)

## 2011-07-17 NOTE — ED Notes (Signed)
Pt brought to er today by family members, from pmd office, has been weak for several months, increasingly worse over the last week, was in er sat. Night and started on home oxygen, is on 2l, sats at that time were in the 70"s, currently 94%. Pt alert and able to answer all ?'s, mild swelling noted to bil  Lower legs, redness noted to below knees.

## 2011-07-17 NOTE — ED Notes (Signed)
Pt resting in bed, awaiting return call from pmd office.

## 2011-07-17 NOTE — ED Provider Notes (Addendum)
History   This chart was scribed for Donnetta Hutching, MD by Brooks Sailors. The patient was seen in room APA01/APA01. Patient's care was started at 1128.   CSN: [id number]  Arrival date & time 07/17/11  1128   First MD Initiated Contact with Patient 07/17/11 1204      Chief Complaint  Patient presents with  . Weakness    (Consider location/radiation/quality/duration/timing/severity/associated sxs/prior treatment) HPI Paula Andrews is a 66 y.o. female who presents to the Emergency Department sent in to the ER by Dr. Renard Matter for weakness and low O2 onset this weekend and persistent since. Patient says her oxygen saturation was 84% at the office. Patient was put on oxygen this weekend and is currently at 2L. Patient's husband additionally notes she has had significantly increased sleep for the past month and has been lethargic and nonresponsive around the house.  Denies injury, chest pain, SOB.   Past Medical History  Diagnosis Date  . Thyroid disease     Past Surgical History  Procedure Date  . Abdominal hysterectomy     History reviewed. No pertinent family history.  History  Substance Use Topics  . Smoking status: Never Smoker   . Smokeless tobacco: Not on file  . Alcohol Use: No    OB History    Grav Para Term Preterm Abortions TAB SAB Ect Mult Living                  Review of Systems  All other systems reviewed and are negative.    Allergies  Review of patient's allergies indicates no known allergies.  Home Medications   Current Outpatient Rx  Name Route Sig Dispense Refill  . VITAMIN C 1000 MG PO TABS Oral Take 1,000 mg by mouth daily.    Marland Kitchen CALCIUM-VITAMIN D 600-200 MG-UNIT PO TABS Oral Take 1 tablet by mouth daily.    Marland Kitchen DOXYCYCLINE HYCLATE 100 MG PO CAPS Oral Take 1 capsule (100 mg total) by mouth 2 (two) times daily. 14 capsule 0  . IBUPROFEN 200 MG PO TABS Oral Take 200 mg by mouth every 6 (six) hours as needed. Pain    . OVER THE COUNTER MEDICATION  Both Eyes Place 1 drop into both eyes daily as needed. Eye Drop used for allergies      BP 153/82  Pulse 76  Temp(Src) 99.3 F (37.4 C) (Oral)  Resp 20  Ht 5\' 4"  (1.626 m)  Wt 149 lb (67.586 kg)  BMI 25.58 kg/m2  SpO2 74%  Physical Exam  Nursing note and vitals reviewed. Constitutional: She is oriented to person, place, and time. She appears well-developed and well-nourished.  HENT:  Head: Normocephalic and atraumatic.  Eyes: Conjunctivae and EOM are normal. Pupils are equal, round, and reactive to light.  Neck: Normal range of motion. Neck supple.  Cardiovascular: Normal rate and regular rhythm.   Pulmonary/Chest: Effort normal and breath sounds normal.  Abdominal: Soft. Bowel sounds are normal.  Musculoskeletal: Normal range of motion.       Area of erythema, 2.5cm diameter distal medical ankle.   Neurological: She is alert and oriented to person, place, and time.  Skin: Skin is warm and dry.  Psychiatric: She has a normal mood and affect.    ED Course  Procedures (including critical care time) DIAGNOSTIC STUDIES: Pulse ox on 2 L 95%. Interpretation normal  COORDINATION OF CARE: 1:02PM Going to take ABG, consult Dr. Megan Mans for care.    Labs Reviewed - No data  to display Ct Angio Chest W/cm &/or Wo Cm  07/15/2011  *RADIOLOGY REPORT*  Clinical Data: Leg swelling and hypoxia.  Question pulmonary embolism.  CT ANGIOGRAPHY CHEST  Technique:  Multidetector CT imaging of the chest using the standard protocol during bolus administration of intravenous contrast. Multiplanar reconstructed images including MIPs were obtained and reviewed to evaluate the vascular anatomy.  Contrast: OMNIPAQUE IOHEXOL 350 MG/ML SOLN  Comparison: Chest radiographs 07/12/2011.  Findings: The pulmonary arteries are well opacified with contrast. There is no evidence of acute pulmonary embolism.  There is aortic and coronary artery atherosclerosis.  No aneurysm or dissection is demonstrated.  No  enlarged mediastinal or hilar lymph nodes are seen.  There is no pleural or pericardial effusion.  There is mild chronic lung disease with minimal emphysema.  No confluent airspace opacity, endobronchial lesion or suspicious pulmonary nodule is identified.  The visualized upper abdomen is notable for probable small cysts anteriorly in the liver.  There is no adrenal mass.  There are no acute or suspicious osseous findings.  IMPRESSION:  1.  No evidence of acute pulmonary embolism or other acute chest process. 2.  Aortic and coronary artery atherosclerosis.  Original Report Authenticated By: Gerrianne Scale, M.D.     No diagnosis found.  Results for orders placed during the hospital encounter of 07/15/11  BLOOD GAS, ARTERIAL      Component Value Range   FIO2 0.21     pH, Arterial 7.357  7.350 - 7.400    pCO2 arterial 57.9 (*) 35.0 - 45.0 (mmHg)   pO2, Arterial 46.6 (*) 80.0 - 100.0 (mmHg)   Bicarbonate 31.7 (*) 20.0 - 24.0 (mEq/L)   TCO2 27.3  0 - 100 (mmol/L)   Acid-Base Excess 6.3 (*) 0.0 - 2.0 (mmol/L)   O2 Saturation 80.5     Patient temperature 37.0     Collection site RIGHT RADIAL     Drawn by COLLECTED BY RT     Sample type ARTERIAL     Allens test (pass/fail) PASS  PASS      MDM  Patient is in no acute distress. Hypoxemia and hypercarbia noted. Good color. Pulse ox 95% in ED.  Do not agree with noted SpO2.  Discussed with Dr. Renard Matter.  Referral made to Dr. Juanetta Gosling. Appointment made for this Thursday.   Discussed with family and patient      I personally performed the services described in this documentation, which was scribed in my presence. The recorded information has been reviewed and considered.    Donnetta Hutching, MD 07/17/11 1553  Donnetta Hutching, MD 07/17/11 [phone]

## 2011-07-17 NOTE — ED Notes (Signed)
Seen by Dr Renard Matter this am and told to come here to have ABG.  Recent cellulitis.  Weakness, swelling of  Legs,  Sleeping more than usual.

## 2011-07-17 NOTE — Discharge Instructions (Signed)
Continue home oxygen. Appointment with Dr. Juanetta Gosling on Thursday, April 18 at 10 AM.  Call office to confirm.  I discussed your case with Dr. Megan Mans.  CT scan of your chest was reviewed from the other day. No acute findings were noted.

## 2011-08-22 DIAGNOSIS — I10 Essential (primary) hypertension: Secondary | ICD-10-CM | POA: Diagnosis not present

## 2011-08-22 DIAGNOSIS — E039 Hypothyroidism, unspecified: Secondary | ICD-10-CM | POA: Diagnosis not present

## 2011-08-22 DIAGNOSIS — R5383 Other fatigue: Secondary | ICD-10-CM | POA: Diagnosis not present

## 2011-08-22 DIAGNOSIS — R5381 Other malaise: Secondary | ICD-10-CM | POA: Diagnosis not present

## 2011-09-19 DIAGNOSIS — R7301 Impaired fasting glucose: Secondary | ICD-10-CM | POA: Diagnosis not present

## 2011-09-19 DIAGNOSIS — E785 Hyperlipidemia, unspecified: Secondary | ICD-10-CM | POA: Diagnosis not present

## 2011-09-19 DIAGNOSIS — I10 Essential (primary) hypertension: Secondary | ICD-10-CM | POA: Diagnosis not present

## 2011-10-29 ENCOUNTER — Emergency Department (HOSPITAL_COMMUNITY)
Admission: EM | Admit: 2011-10-29 | Discharge: 2011-10-29 | Disposition: A | Discharge status: Home / Self Care | Payer: Medicare Other | Attending: Emergency Medicine | Admitting: Emergency Medicine

## 2011-10-29 ENCOUNTER — Encounter (HOSPITAL_COMMUNITY): Payer: Self-pay | Admitting: *Deleted

## 2011-10-29 DIAGNOSIS — E119 Type 2 diabetes mellitus without complications: Secondary | ICD-10-CM | POA: Insufficient documentation

## 2011-10-29 DIAGNOSIS — E079 Disorder of thyroid, unspecified: Secondary | ICD-10-CM | POA: Insufficient documentation

## 2011-10-29 DIAGNOSIS — L0390 Cellulitis, unspecified: Secondary | ICD-10-CM

## 2011-10-29 DIAGNOSIS — I10 Essential (primary) hypertension: Secondary | ICD-10-CM | POA: Diagnosis not present

## 2011-10-29 DIAGNOSIS — IMO0002 Reserved for concepts with insufficient information to code with codable children: Secondary | ICD-10-CM | POA: Diagnosis not present

## 2011-10-29 HISTORY — DX: Essential (primary) hypertension: I10

## 2011-10-29 LAB — CBC WITH DIFFERENTIAL/PLATELET
Basophils Absolute: 0 10*3/uL (ref 0.0–0.1)
Basophils Relative: 0 % (ref 0–1)
Eosinophils Relative: 0 % (ref 0–5)
HCT: 53.8 % — ABNORMAL HIGH (ref 36.0–46.0)
MCHC: 31.2 g/dL (ref 30.0–36.0)
MCV: 81.8 fL (ref 78.0–100.0)
Monocytes Absolute: 1 10*3/uL (ref 0.1–1.0)
Neutro Abs: 14.1 10*3/uL — ABNORMAL HIGH (ref 1.7–7.7)
Platelets: 232 10*3/uL (ref 150–400)
RDW: 17.1 % — ABNORMAL HIGH (ref 11.5–15.5)

## 2011-10-29 LAB — BASIC METABOLIC PANEL
Calcium: 9.5 mg/dL (ref 8.4–10.5)
Creatinine, Ser: 0.71 mg/dL (ref 0.50–1.10)
GFR calc Af Amer: >90 mL/min (ref >90)
Sodium: 132 mEq/L — ABNORMAL LOW (ref 135–145)

## 2011-10-29 MED ORDER — CLINDAMYCIN HCL 150 MG PO CAPS: 300.0000 mg | ORAL_CAPSULE | Freq: Four times a day (QID) | ORAL | Status: AC

## 2011-10-29 MED ORDER — CLINDAMYCIN PHOSPHATE 600 MG/50ML IV SOLN
600.0000 mg | Freq: Once | INTRAVENOUS | Status: AC
  Administered 2011-10-29: 600 mg via INTRAVENOUS
  Filled 2011-10-29: qty 50

## 2011-10-29 MED ORDER — TETANUS-DIPHTH-ACELL PERTUSSIS 5-2.5-18.5 LF-MCG/0.5 IM SUSP
0.5000 mL | Freq: Once | INTRAMUSCULAR | Status: AC
  Administered 2011-10-29: 0.5 mL via INTRAMUSCULAR
  Filled 2011-10-29: qty 0.5

## 2011-10-29 MED ORDER — CLINDAMYCIN HCL 150 MG PO CAPS
300.0000 mg | ORAL_CAPSULE | Freq: Four times a day (QID) | ORAL | Status: DC
  Administered 2011-10-29: 300 mg via ORAL
  Filled 2011-10-29 (×2): qty 2

## 2011-10-29 MED ORDER — SODIUM CHLORIDE 0.9 % IV BOLUS (SEPSIS)
500.0000 mL | Freq: Once | INTRAVENOUS | Status: AC
  Administered 2011-10-29: 17:00:00 via INTRAVENOUS

## 2011-10-29 NOTE — ED Notes (Signed)
Marked around the redness of the rt forearm.

## 2011-10-29 NOTE — ED Provider Notes (Addendum)
History     CSN: [id number]  Arrival date & time 10/29/11  1534   First MD Initiated Contact with Patient 10/29/11 1650      Chief Complaint  Patient presents with  . Insect Bite    (Consider location/radiation/quality/duration/timing/severity/associated sxs/prior treatment) HPI Pt reports she has noticed some increasing redness and swelling to her R arm for the last 2 days. Thinks she might have been bitten by something but not sure. She has not had any fevers or drainage. She is a diabetic.   Past Medical History  Diagnosis Date  . Thyroid disease   . Hypertension   . Diabetes mellitus     Past Surgical History  Procedure Date  . Abdominal hysterectomy     No family history on file.  History  Substance Use Topics  . Smoking status: Never Smoker   . Smokeless tobacco: Not on file  . Alcohol Use: No    OB History    Grav Para Term Preterm Abortions TAB SAB Ect Mult Living                  Review of Systems All other systems reviewed and are negative except as noted in HPI.   Allergies  Review of patient's allergies indicates no known allergies.  Home Medications   Current Outpatient Rx  Name Route Sig Dispense Refill  . VITAMIN C 1000 MG PO TABS Oral Take 1,000 mg by mouth daily.    Marland Kitchen CALCIUM-VITAMIN D 600-200 MG-UNIT PO TABS Oral Take 1 tablet by mouth daily.    . IBUPROFEN 200 MG PO TABS Oral Take 200 mg by mouth every 6 (six) hours as needed. Pain    . LEVOTHYROXINE SODIUM 25 MCG PO TABS Oral Take 25 mcg by mouth daily.    Marland Kitchen LISINOPRIL-HYDROCHLOROTHIAZIDE 20-25 MG PO TABS Oral Take 1 tablet by mouth daily.    Marland Kitchen METFORMIN HCL 500 MG PO TABS Oral Take 500 mg by mouth 2 (two) times daily.    Marland Kitchen OVER THE COUNTER MEDICATION Both Eyes Place 1 drop into both eyes daily as needed. Eye Drop used for allergies      BP 136/58  Pulse 77  Temp 98.1 F (36.7 C) (Oral)  Resp 18  Ht 5' (1.524 m)  Wt 150 lb (68.04 kg)  BMI 29.30 kg/m2  SpO2 94%  Physical  Exam  Nursing note and vitals reviewed. Constitutional: She is oriented to person, place, and time. She appears well-developed and well-nourished.  HENT:  Head: Normocephalic and atraumatic.  Eyes: EOM are normal. Pupils are equal, round, and reactive to light.  Neck: Normal range of motion. Neck supple.  Cardiovascular: Normal rate, normal heart sounds and intact distal pulses.   Pulmonary/Chest: Effort normal and breath sounds normal.  Abdominal: Bowel sounds are normal. She exhibits no distension. There is no tenderness.  Musculoskeletal: Normal range of motion. She exhibits no edema and no tenderness.  Neurological: She is alert and oriented to person, place, and time. She has normal strength. No cranial nerve deficit or sensory deficit.  Skin: Skin is warm and dry. Rash noted. There is erythema.       Cellulitis of R forearm, extends to medial R elbow and upper arm, no abscess; pt also has abrasions on L forearm from dog bite that is unrelated. Dog is family's and has had rabies vaccinations  Psychiatric: She has a normal mood and affect.    ED Course  Procedures (including critical care time)  Labs Reviewed  CBC WITH DIFFERENTIAL - Abnormal; Notable for the following:    WBC 16.4 (*)     RBC 6.58 (*)     Hemoglobin 16.8 (*)     HCT 53.8 (*)     MCH 25.5 (*)     RDW 17.1 (*)     Neutrophils Relative 86 (*)     Neutro Abs 14.1 (*)     Lymphocytes Relative 8 (*)     All other components within normal limits  BASIC METABOLIC PANEL - Abnormal; Notable for the following:    Sodium 132 (*)     Chloride 92 (*)     Glucose, Bld 130 (*)     GFR calc non Af Amer 89 (*)     All other components within normal limits   No results found.   No diagnosis found.    MDM  Diabetic with cellulitis. Will check labs, give a dose of IV Clindamycin and reassess.   6:26 PM Pt sleeping in room. Labs as above. Given 2 doses of Clindamycin PO to take home to get through the night and then  get Rx filled tomorrow. PCP or ED followup in 24hrs for recheck.       Charles B. Bernette Mayers, MD 10/29/11 1826  Note, patient's SpO2 on discharge is low. She is asymptomatic with walking. She states has had similar in the past without etiology determined by PCP. She is comfortably going home and following with PCP tomorrow.   Charles B. Bernette Mayers, MD 10/29/11 1191

## 2011-10-29 NOTE — ED Notes (Signed)
Pt o2 sat was 87% at discharge and EDP aware. Pt was given 2 doses of clindamycin per EDP for use tonight and in the am.

## 2011-10-29 NOTE — ED Notes (Signed)
C/o insect bite to middle of right arm on Friday, arm has started swelling from the hand area to right elbow area. Redness noted to same extent. Pt states that the arm has gotten worse over the past day or so. Cms intact distal

## 2011-10-30 DIAGNOSIS — IMO0002 Reserved for concepts with insufficient information to code with codable children: Secondary | ICD-10-CM | POA: Diagnosis not present

## 2011-10-31 DIAGNOSIS — IMO0002 Reserved for concepts with insufficient information to code with codable children: Secondary | ICD-10-CM | POA: Diagnosis not present

## 2011-10-31 DIAGNOSIS — I10 Essential (primary) hypertension: Secondary | ICD-10-CM | POA: Diagnosis not present

## 2011-10-31 DIAGNOSIS — E039 Hypothyroidism, unspecified: Secondary | ICD-10-CM | POA: Diagnosis not present

## 2011-10-31 DIAGNOSIS — Z79899 Other long term (current) drug therapy: Secondary | ICD-10-CM | POA: Diagnosis not present

## 2011-12-05 DIAGNOSIS — Z23 Encounter for immunization: Secondary | ICD-10-CM | POA: Diagnosis not present

## 2011-12-20 DIAGNOSIS — E119 Type 2 diabetes mellitus without complications: Secondary | ICD-10-CM | POA: Diagnosis not present

## 2011-12-20 DIAGNOSIS — I10 Essential (primary) hypertension: Secondary | ICD-10-CM | POA: Diagnosis not present

## 2011-12-20 DIAGNOSIS — E039 Hypothyroidism, unspecified: Secondary | ICD-10-CM | POA: Diagnosis not present

## 2011-12-20 DIAGNOSIS — E785 Hyperlipidemia, unspecified: Secondary | ICD-10-CM | POA: Diagnosis not present

## 2011-12-28 DIAGNOSIS — I10 Essential (primary) hypertension: Secondary | ICD-10-CM | POA: Diagnosis not present

## 2012-01-16 DIAGNOSIS — I10 Essential (primary) hypertension: Secondary | ICD-10-CM | POA: Diagnosis not present

## 2012-01-16 DIAGNOSIS — K219 Gastro-esophageal reflux disease without esophagitis: Secondary | ICD-10-CM | POA: Diagnosis not present

## 2012-01-28 ENCOUNTER — Encounter (HOSPITAL_COMMUNITY): Payer: Self-pay | Admitting: Emergency Medicine

## 2012-01-28 ENCOUNTER — Emergency Department (HOSPITAL_COMMUNITY): Payer: Medicare Other

## 2012-01-28 ENCOUNTER — Emergency Department (HOSPITAL_COMMUNITY)
Admission: EM | Admit: 2012-01-28 | Discharge: 2012-01-28 | Disposition: A | Discharge status: Home / Self Care | Payer: Medicare Other | Attending: Emergency Medicine | Admitting: Emergency Medicine

## 2012-01-28 DIAGNOSIS — S52133A Displaced fracture of neck of unspecified radius, initial encounter for closed fracture: Secondary | ICD-10-CM | POA: Insufficient documentation

## 2012-01-28 DIAGNOSIS — S42409A Unspecified fracture of lower end of unspecified humerus, initial encounter for closed fracture: Secondary | ICD-10-CM

## 2012-01-28 DIAGNOSIS — I10 Essential (primary) hypertension: Secondary | ICD-10-CM | POA: Insufficient documentation

## 2012-01-28 DIAGNOSIS — E079 Disorder of thyroid, unspecified: Secondary | ICD-10-CM | POA: Insufficient documentation

## 2012-01-28 DIAGNOSIS — Y9301 Activity, walking, marching and hiking: Secondary | ICD-10-CM | POA: Insufficient documentation

## 2012-01-28 DIAGNOSIS — E119 Type 2 diabetes mellitus without complications: Secondary | ICD-10-CM | POA: Insufficient documentation

## 2012-01-28 DIAGNOSIS — Y92410 Unspecified street and highway as the place of occurrence of the external cause: Secondary | ICD-10-CM | POA: Insufficient documentation

## 2012-01-28 DIAGNOSIS — Z79899 Other long term (current) drug therapy: Secondary | ICD-10-CM | POA: Insufficient documentation

## 2012-01-28 DIAGNOSIS — R296 Repeated falls: Secondary | ICD-10-CM | POA: Insufficient documentation

## 2012-01-28 MED ORDER — HYDROCODONE-ACETAMINOPHEN 5-325 MG PO TABS: 1.0000 | ORAL_TABLET | Freq: Four times a day (QID) | ORAL | Status: DC | PRN

## 2012-01-28 NOTE — ED Notes (Signed)
Pt c/o left arm pain after falling while walking her dog on Friday.

## 2012-01-28 NOTE — ED Provider Notes (Signed)
History   This chart was scribed for Benny Lennert, MD by Melba Coon. The patient was seen in room APAH2/APAH2 and the patient's care was started at 3:43PM.    CSN: [id number]  Arrival date & time 01/28/12  1427   First MD Initiated Contact with Patient 01/28/12 1531      Chief Complaint  Patient presents with  . Arm Pain    (Consider location/radiation/quality/duration/timing/severity/associated sxs/prior treatment) Patient is a 66 y.o. female presenting with arm injury. The history is provided by the patient. No language interpreter was used.  Arm Injury  The incident occurred more than 2 days ago. The incident occurred in the street (walking her dog). The injury mechanism was a fall. Context: fell on left elbow. The wounds were not self-inflicted. No protective equipment was used. She came to the ER via personal transport. There is an injury to the left elbow. The pain is moderate. It is unlikely that a foreign body is present. Pertinent negatives include no chest pain, no abdominal pain, no headaches, no loss of consciousness, no seizures and no cough. She has been behaving normally.  Moving her left arm aggravates the pain. No known allergies. No other pertinent medical symptoms.   Past Medical History  Diagnosis Date  . Thyroid disease   . Hypertension   . Diabetes mellitus     Past Surgical History  Procedure Date  . Abdominal hysterectomy     No family history on file.  History  Substance Use Topics  . Smoking status: Never Smoker   . Smokeless tobacco: Not on file  . Alcohol Use: No    OB History    Grav Para Term Preterm Abortions TAB SAB Ect Mult Living                  Review of Systems  Constitutional: Negative for fatigue.  HENT: Negative for congestion, sinus pressure and ear discharge.   Eyes: Negative for discharge.  Respiratory: Negative for cough.   Cardiovascular: Negative for chest pain.  Gastrointestinal: Negative for abdominal pain  and diarrhea.  Genitourinary: Negative for frequency and hematuria.  Musculoskeletal: Positive for arthralgias (left elbow). Negative for back pain.  Skin: Negative for rash.  Neurological: Negative for seizures, loss of consciousness and headaches.  Hematological: Negative.   Psychiatric/Behavioral: Negative for hallucinations.   10 Systems reviewed and all are negative for acute change except as noted in the HPI.   Allergies  Review of patient's allergies indicates no known allergies.  Home Medications   Current Outpatient Rx  Name Route Sig Dispense Refill  . VITAMIN C 1000 MG PO TABS Oral Take 1,000 mg by mouth daily.    Marland Kitchen CALCIUM-VITAMIN D 600-200 MG-UNIT PO TABS Oral Take 1 tablet by mouth daily.    . IBUPROFEN 200 MG PO TABS Oral Take 200 mg by mouth every 6 (six) hours as needed. Pain    . LEVOTHYROXINE SODIUM 25 MCG PO TABS Oral Take 25 mcg by mouth daily.    Marland Kitchen LISINOPRIL-HYDROCHLOROTHIAZIDE 20-25 MG PO TABS Oral Take 1 tablet by mouth daily.    Marland Kitchen METFORMIN HCL 500 MG PO TABS Oral Take 500 mg by mouth 2 (two) times daily.    Marland Kitchen OVER THE COUNTER MEDICATION Both Eyes Place 1 drop into both eyes daily as needed. Eye Drop used for allergies      BP 160/68  Pulse 62  Temp 98.4 F (36.9 C)  Resp 18  Ht 5\' 4"  (1.626 m)  Wt 152 lb (68.947 kg)  BMI 26.09 kg/m2  SpO2 93%  Physical Exam  Nursing note and vitals reviewed. Constitutional:       Awake, alert, nontoxic appearance.  HENT:  Head: Atraumatic.  Eyes: Right eye exhibits no discharge. Left eye exhibits no discharge.  Neck: Neck supple.  Cardiovascular: Normal rate, regular rhythm and normal heart sounds.   Pulmonary/Chest: Effort normal. She exhibits no tenderness.  Abdominal: Soft. There is no tenderness. There is no rebound.  Musculoskeletal: She exhibits tenderness (left elbow pain that is worse with extension or external rotation).       Baseline ROM, no obvious new focal weakness.  Neurological:        Mental status and motor strength appears baseline for patient and situation.  Skin: No rash noted.  Psychiatric: She has a normal mood and affect.    ED Course  Procedures (including critical care time)  DIAGNOSTIC STUDIES: Oxygen Saturation is 93% on room air, low by my interpretation.    COORDINATION OF CARE:  3:47PM - imaging reviewed; she has a nondisplaced radial neck fracture in her left arm.   Labs Reviewed - No data to display Dg Elbow Complete Left  01/28/2012  *RADIOLOGY REPORT*  Clinical Data: 66 year old female with left elbow pain and swelling following injury.  LEFT ELBOW - COMPLETE 3+ VIEW  Comparison: 04/11/2010 humerus radiographs  Findings: A nondisplaced radial neck fracture is present with joint effusion. No other fractures are identified. There is no evidence of subluxation or dislocation. No focal bony lesions are present.  IMPRESSION: Nondisplaced radial neck fracture with elbow effusion.   Original Report Authenticated By: Rosendo Gros, M.D.      No diagnosis found.    MDM    The chart was scribed for me under my direct supervision.  I personally performed the history, physical, and medical decision making and all procedures in the evaluation of this patient.Benny Lennert, MD 01/28/12 2249

## 2012-01-29 ENCOUNTER — Ambulatory Visit (INDEPENDENT_AMBULATORY_CARE_PROVIDER_SITE_OTHER): Payer: Medicare Other | Admitting: Orthopedic Surgery

## 2012-01-29 ENCOUNTER — Encounter: Payer: Self-pay | Admitting: Orthopedic Surgery

## 2012-01-29 VITALS — BP 112/60 | Ht 64.0 in | Wt 152.0 lb

## 2012-01-29 DIAGNOSIS — S52133A Displaced fracture of neck of unspecified radius, initial encounter for closed fracture: Secondary | ICD-10-CM

## 2012-01-29 DIAGNOSIS — S52132A Displaced fracture of neck of left radius, initial encounter for closed fracture: Secondary | ICD-10-CM | POA: Insufficient documentation

## 2012-01-29 NOTE — Progress Notes (Signed)
  Subjective:    Patient ID: Paula Andrews, female    DOB: 04-16-1945, 66 y.o.   MRN: [id number]  Chief Complaint  Patient presents with  . Elbow Injury    left radial neck fracture, DOI 01/26/12    HPI this 22 female fell while attending to her dog on October 25 area she had x-rays which showed radial neck fracture nondisplaced her pain level is 1/10 she has no numbness or tingling she does have some swelling and stiffness with pain worsening with attempted motion and better with swelling. Review of systems negative except for musculoskeletal system  Medical history recorded and reviewed    Review of Systems Normal see history and physical    Objective:   Physical Exam  Constitutional: She is oriented to person, place, and time. She appears well-developed and well-nourished.  HENT:  Head: Normocephalic.  Eyes: Pupils are equal, round, and reactive to light.  Cardiovascular: Normal rate and intact distal pulses.   Musculoskeletal:       Right elbow: Normal.      Left elbow: Radial head tenderness noted.       Lower extremities without contracture subluxation atrophy or tremor.  Lymphadenopathy:    She has no cervical adenopathy.  Neurological: She is alert and oriented to person, place, and time. She has normal reflexes.  Skin: Skin is warm and dry. No rash noted. No erythema. No pallor.  Psychiatric: She has a normal mood and affect. Her behavior is normal. Judgment and thought content normal.   Left Elbow Exam   Tenderness  The patient is experiencing tenderness in the radial head and radiocapitellar joint.  Range of Motion  Extension:  20 Flexion:      110 Pronation:   20 Supination: 30  Right Elbow Exam  Right elbow exam is normal.     X-rays show a radial neck fracture     Assessment & Plan:  Nondisplaced radial neck fracture recommend active assisted range of motion exercises with an x-ray in 3 weeks

## 2012-01-29 NOTE — Patient Instructions (Signed)
3 weeks follow up

## 2012-02-20 ENCOUNTER — Encounter: Payer: Self-pay | Admitting: Orthopedic Surgery

## 2012-02-20 ENCOUNTER — Ambulatory Visit (INDEPENDENT_AMBULATORY_CARE_PROVIDER_SITE_OTHER): Payer: Medicare Other

## 2012-02-20 ENCOUNTER — Ambulatory Visit (INDEPENDENT_AMBULATORY_CARE_PROVIDER_SITE_OTHER): Payer: Medicare Other | Admitting: Orthopedic Surgery

## 2012-02-20 DIAGNOSIS — S42409A Unspecified fracture of lower end of unspecified humerus, initial encounter for closed fracture: Secondary | ICD-10-CM | POA: Diagnosis not present

## 2012-02-20 NOTE — Patient Instructions (Signed)
activities as tolerated 

## 2012-02-20 NOTE — Progress Notes (Signed)
Patient ID: Paula Andrews, female   DOB: [birth year], 66 y.o.   MRN: [id number] Chief Complaint  Patient presents with  . Elbow Problem    radial head fracture 10/25/    Fracture care xrays   Left ebow ROM 20-125  xrays fracture healed anatomically   Shower her exercises to do   activities as tolerated

## 2012-04-16 DIAGNOSIS — I10 Essential (primary) hypertension: Secondary | ICD-10-CM | POA: Diagnosis not present

## 2012-04-16 DIAGNOSIS — E039 Hypothyroidism, unspecified: Secondary | ICD-10-CM | POA: Diagnosis not present

## 2012-04-16 DIAGNOSIS — E119 Type 2 diabetes mellitus without complications: Secondary | ICD-10-CM | POA: Diagnosis not present

## 2012-05-15 DIAGNOSIS — E785 Hyperlipidemia, unspecified: Secondary | ICD-10-CM | POA: Diagnosis not present

## 2012-05-15 DIAGNOSIS — I10 Essential (primary) hypertension: Secondary | ICD-10-CM | POA: Diagnosis not present

## 2012-05-15 DIAGNOSIS — E119 Type 2 diabetes mellitus without complications: Secondary | ICD-10-CM | POA: Diagnosis not present

## 2012-05-15 DIAGNOSIS — E039 Hypothyroidism, unspecified: Secondary | ICD-10-CM | POA: Diagnosis not present

## 2012-06-19 DIAGNOSIS — D45 Polycythemia vera: Secondary | ICD-10-CM | POA: Diagnosis not present

## 2012-09-06 DIAGNOSIS — E039 Hypothyroidism, unspecified: Secondary | ICD-10-CM | POA: Diagnosis not present

## 2012-09-06 DIAGNOSIS — E119 Type 2 diabetes mellitus without complications: Secondary | ICD-10-CM | POA: Diagnosis not present

## 2012-09-06 DIAGNOSIS — E785 Hyperlipidemia, unspecified: Secondary | ICD-10-CM | POA: Diagnosis not present

## 2012-09-06 DIAGNOSIS — I10 Essential (primary) hypertension: Secondary | ICD-10-CM | POA: Diagnosis not present

## 2012-10-29 DIAGNOSIS — R42 Dizziness and giddiness: Secondary | ICD-10-CM | POA: Diagnosis not present

## 2012-12-03 DIAGNOSIS — Z23 Encounter for immunization: Secondary | ICD-10-CM | POA: Diagnosis not present

## 2013-03-14 DIAGNOSIS — R7309 Other abnormal glucose: Secondary | ICD-10-CM | POA: Diagnosis not present

## 2013-03-14 DIAGNOSIS — E119 Type 2 diabetes mellitus without complications: Secondary | ICD-10-CM | POA: Diagnosis not present

## 2013-03-14 DIAGNOSIS — I10 Essential (primary) hypertension: Secondary | ICD-10-CM | POA: Diagnosis not present

## 2013-03-14 DIAGNOSIS — E039 Hypothyroidism, unspecified: Secondary | ICD-10-CM | POA: Diagnosis not present

## 2013-03-14 DIAGNOSIS — E785 Hyperlipidemia, unspecified: Secondary | ICD-10-CM | POA: Diagnosis not present

## 2013-07-16 DIAGNOSIS — E119 Type 2 diabetes mellitus without complications: Secondary | ICD-10-CM | POA: Diagnosis not present

## 2013-07-16 DIAGNOSIS — E039 Hypothyroidism, unspecified: Secondary | ICD-10-CM | POA: Diagnosis not present

## 2013-07-16 DIAGNOSIS — I10 Essential (primary) hypertension: Secondary | ICD-10-CM | POA: Diagnosis not present

## 2013-07-16 DIAGNOSIS — E785 Hyperlipidemia, unspecified: Secondary | ICD-10-CM | POA: Diagnosis not present

## 2013-07-18 DIAGNOSIS — E119 Type 2 diabetes mellitus without complications: Secondary | ICD-10-CM | POA: Diagnosis not present

## 2013-07-18 DIAGNOSIS — E039 Hypothyroidism, unspecified: Secondary | ICD-10-CM | POA: Diagnosis not present

## 2013-07-18 DIAGNOSIS — E785 Hyperlipidemia, unspecified: Secondary | ICD-10-CM | POA: Diagnosis not present

## 2013-07-18 DIAGNOSIS — I10 Essential (primary) hypertension: Secondary | ICD-10-CM | POA: Diagnosis not present

## 2013-11-13 DIAGNOSIS — E785 Hyperlipidemia, unspecified: Secondary | ICD-10-CM | POA: Diagnosis not present

## 2013-11-13 DIAGNOSIS — I10 Essential (primary) hypertension: Secondary | ICD-10-CM | POA: Diagnosis not present

## 2013-11-13 DIAGNOSIS — E039 Hypothyroidism, unspecified: Secondary | ICD-10-CM | POA: Diagnosis not present

## 2013-11-13 DIAGNOSIS — E119 Type 2 diabetes mellitus without complications: Secondary | ICD-10-CM | POA: Diagnosis not present

## 2013-11-17 DIAGNOSIS — E785 Hyperlipidemia, unspecified: Secondary | ICD-10-CM | POA: Diagnosis not present

## 2013-11-17 DIAGNOSIS — E039 Hypothyroidism, unspecified: Secondary | ICD-10-CM | POA: Diagnosis not present

## 2013-11-17 DIAGNOSIS — I10 Essential (primary) hypertension: Secondary | ICD-10-CM | POA: Diagnosis not present

## 2013-11-17 DIAGNOSIS — E119 Type 2 diabetes mellitus without complications: Secondary | ICD-10-CM | POA: Diagnosis not present

## 2013-12-17 DIAGNOSIS — Z23 Encounter for immunization: Secondary | ICD-10-CM | POA: Diagnosis not present

## 2014-02-17 DIAGNOSIS — I10 Essential (primary) hypertension: Secondary | ICD-10-CM | POA: Diagnosis not present

## 2014-02-17 DIAGNOSIS — E039 Hypothyroidism, unspecified: Secondary | ICD-10-CM | POA: Diagnosis not present

## 2014-02-17 DIAGNOSIS — E119 Type 2 diabetes mellitus without complications: Secondary | ICD-10-CM | POA: Diagnosis not present

## 2014-02-17 DIAGNOSIS — E785 Hyperlipidemia, unspecified: Secondary | ICD-10-CM | POA: Diagnosis not present

## 2014-02-20 DIAGNOSIS — E1165 Type 2 diabetes mellitus with hyperglycemia: Secondary | ICD-10-CM | POA: Diagnosis not present

## 2014-02-20 DIAGNOSIS — E782 Mixed hyperlipidemia: Secondary | ICD-10-CM | POA: Diagnosis not present

## 2014-02-20 DIAGNOSIS — I10 Essential (primary) hypertension: Secondary | ICD-10-CM | POA: Diagnosis not present

## 2014-02-20 DIAGNOSIS — E039 Hypothyroidism, unspecified: Secondary | ICD-10-CM | POA: Diagnosis not present

## 2014-06-24 DIAGNOSIS — E1165 Type 2 diabetes mellitus with hyperglycemia: Secondary | ICD-10-CM | POA: Diagnosis not present

## 2014-06-24 DIAGNOSIS — I10 Essential (primary) hypertension: Secondary | ICD-10-CM | POA: Diagnosis not present

## 2014-06-24 DIAGNOSIS — E039 Hypothyroidism, unspecified: Secondary | ICD-10-CM | POA: Diagnosis not present

## 2014-07-08 DIAGNOSIS — E1165 Type 2 diabetes mellitus with hyperglycemia: Secondary | ICD-10-CM | POA: Diagnosis not present

## 2014-07-08 DIAGNOSIS — E039 Hypothyroidism, unspecified: Secondary | ICD-10-CM | POA: Diagnosis not present

## 2014-07-08 DIAGNOSIS — I10 Essential (primary) hypertension: Secondary | ICD-10-CM | POA: Diagnosis not present

## 2014-07-08 DIAGNOSIS — R0902 Hypoxemia: Secondary | ICD-10-CM | POA: Diagnosis not present

## 2014-07-14 ENCOUNTER — Other Ambulatory Visit (HOSPITAL_COMMUNITY): Payer: Self-pay | Admitting: Radiology

## 2014-07-14 DIAGNOSIS — R0902 Hypoxemia: Secondary | ICD-10-CM

## 2014-10-06 DIAGNOSIS — L0390 Cellulitis, unspecified: Secondary | ICD-10-CM | POA: Diagnosis not present

## 2014-11-06 DIAGNOSIS — E782 Mixed hyperlipidemia: Secondary | ICD-10-CM | POA: Diagnosis not present

## 2014-11-06 DIAGNOSIS — I10 Essential (primary) hypertension: Secondary | ICD-10-CM | POA: Diagnosis not present

## 2014-11-06 DIAGNOSIS — E1165 Type 2 diabetes mellitus with hyperglycemia: Secondary | ICD-10-CM | POA: Diagnosis not present

## 2014-11-10 DIAGNOSIS — E1165 Type 2 diabetes mellitus with hyperglycemia: Secondary | ICD-10-CM | POA: Diagnosis not present

## 2014-11-10 DIAGNOSIS — G3184 Mild cognitive impairment, so stated: Secondary | ICD-10-CM | POA: Diagnosis not present

## 2014-11-10 DIAGNOSIS — E782 Mixed hyperlipidemia: Secondary | ICD-10-CM | POA: Diagnosis not present

## 2014-11-10 DIAGNOSIS — I10 Essential (primary) hypertension: Secondary | ICD-10-CM | POA: Diagnosis not present

## 2014-11-10 DIAGNOSIS — E039 Hypothyroidism, unspecified: Secondary | ICD-10-CM | POA: Diagnosis not present

## 2015-03-02 DIAGNOSIS — I10 Essential (primary) hypertension: Secondary | ICD-10-CM | POA: Diagnosis not present

## 2015-03-02 DIAGNOSIS — E1165 Type 2 diabetes mellitus with hyperglycemia: Secondary | ICD-10-CM | POA: Diagnosis not present

## 2015-03-02 DIAGNOSIS — E039 Hypothyroidism, unspecified: Secondary | ICD-10-CM | POA: Diagnosis not present

## 2015-03-08 ENCOUNTER — Other Ambulatory Visit (HOSPITAL_COMMUNITY): Payer: Self-pay | Admitting: Internal Medicine

## 2015-03-08 DIAGNOSIS — E119 Type 2 diabetes mellitus without complications: Secondary | ICD-10-CM | POA: Diagnosis not present

## 2015-03-08 DIAGNOSIS — J449 Chronic obstructive pulmonary disease, unspecified: Secondary | ICD-10-CM | POA: Diagnosis not present

## 2015-03-08 DIAGNOSIS — Z1231 Encounter for screening mammogram for malignant neoplasm of breast: Secondary | ICD-10-CM

## 2015-03-08 DIAGNOSIS — G3184 Mild cognitive impairment, so stated: Secondary | ICD-10-CM | POA: Diagnosis not present

## 2015-03-08 DIAGNOSIS — I10 Essential (primary) hypertension: Secondary | ICD-10-CM | POA: Diagnosis not present

## 2015-03-08 DIAGNOSIS — D582 Other hemoglobinopathies: Secondary | ICD-10-CM | POA: Diagnosis not present

## 2015-03-08 DIAGNOSIS — E782 Mixed hyperlipidemia: Secondary | ICD-10-CM | POA: Diagnosis not present

## 2015-03-12 ENCOUNTER — Ambulatory Visit (HOSPITAL_COMMUNITY)
Admission: RE | Admit: 2015-03-12 | Discharge: 2015-03-12 | Disposition: A | Discharge status: Home / Self Care | Payer: Medicare Other | Source: Ambulatory Visit | Attending: Internal Medicine | Admitting: Internal Medicine

## 2015-03-12 DIAGNOSIS — Z1231 Encounter for screening mammogram for malignant neoplasm of breast: Secondary | ICD-10-CM | POA: Diagnosis not present

## 2015-06-04 DIAGNOSIS — I10 Essential (primary) hypertension: Secondary | ICD-10-CM | POA: Diagnosis not present

## 2015-06-04 DIAGNOSIS — L600 Ingrowing nail: Secondary | ICD-10-CM | POA: Diagnosis not present

## 2015-06-17 DIAGNOSIS — M79674 Pain in right toe(s): Secondary | ICD-10-CM | POA: Diagnosis not present

## 2015-06-17 DIAGNOSIS — L03031 Cellulitis of right toe: Secondary | ICD-10-CM | POA: Diagnosis not present

## 2015-06-17 DIAGNOSIS — L600 Ingrowing nail: Secondary | ICD-10-CM | POA: Diagnosis not present

## 2015-06-17 DIAGNOSIS — M79671 Pain in right foot: Secondary | ICD-10-CM | POA: Diagnosis not present

## 2015-06-21 DIAGNOSIS — I10 Essential (primary) hypertension: Secondary | ICD-10-CM | POA: Diagnosis not present

## 2015-07-01 DIAGNOSIS — M79671 Pain in right foot: Secondary | ICD-10-CM | POA: Diagnosis not present

## 2015-07-01 DIAGNOSIS — L600 Ingrowing nail: Secondary | ICD-10-CM | POA: Diagnosis not present

## 2015-07-01 DIAGNOSIS — M79674 Pain in right toe(s): Secondary | ICD-10-CM | POA: Diagnosis not present

## 2015-07-01 DIAGNOSIS — L03031 Cellulitis of right toe: Secondary | ICD-10-CM | POA: Diagnosis not present

## 2015-07-08 DIAGNOSIS — E039 Hypothyroidism, unspecified: Secondary | ICD-10-CM | POA: Diagnosis not present

## 2015-07-08 DIAGNOSIS — E1165 Type 2 diabetes mellitus with hyperglycemia: Secondary | ICD-10-CM | POA: Diagnosis not present

## 2015-07-08 DIAGNOSIS — I10 Essential (primary) hypertension: Secondary | ICD-10-CM | POA: Diagnosis not present

## 2015-07-13 DIAGNOSIS — R4181 Age-related cognitive decline: Secondary | ICD-10-CM | POA: Diagnosis not present

## 2015-07-13 DIAGNOSIS — D582 Other hemoglobinopathies: Secondary | ICD-10-CM | POA: Diagnosis not present

## 2015-07-13 DIAGNOSIS — E1165 Type 2 diabetes mellitus with hyperglycemia: Secondary | ICD-10-CM | POA: Diagnosis not present

## 2015-07-13 DIAGNOSIS — E039 Hypothyroidism, unspecified: Secondary | ICD-10-CM | POA: Diagnosis not present

## 2015-12-13 DIAGNOSIS — Z23 Encounter for immunization: Secondary | ICD-10-CM | POA: Diagnosis not present

## 2016-01-13 DIAGNOSIS — E1165 Type 2 diabetes mellitus with hyperglycemia: Secondary | ICD-10-CM | POA: Diagnosis not present

## 2016-01-13 DIAGNOSIS — E039 Hypothyroidism, unspecified: Secondary | ICD-10-CM | POA: Diagnosis not present

## 2016-01-13 DIAGNOSIS — R7301 Impaired fasting glucose: Secondary | ICD-10-CM | POA: Diagnosis not present

## 2016-01-13 DIAGNOSIS — E785 Hyperlipidemia, unspecified: Secondary | ICD-10-CM | POA: Diagnosis not present

## 2016-01-13 DIAGNOSIS — I10 Essential (primary) hypertension: Secondary | ICD-10-CM | POA: Diagnosis not present

## 2016-01-13 DIAGNOSIS — E119 Type 2 diabetes mellitus without complications: Secondary | ICD-10-CM | POA: Diagnosis not present

## 2016-01-13 DIAGNOSIS — E782 Mixed hyperlipidemia: Secondary | ICD-10-CM | POA: Diagnosis not present

## 2016-01-13 DIAGNOSIS — I482 Chronic atrial fibrillation: Secondary | ICD-10-CM | POA: Diagnosis not present

## 2016-01-18 DIAGNOSIS — I10 Essential (primary) hypertension: Secondary | ICD-10-CM | POA: Diagnosis not present

## 2016-01-18 DIAGNOSIS — E782 Mixed hyperlipidemia: Secondary | ICD-10-CM | POA: Diagnosis not present

## 2016-01-18 DIAGNOSIS — Z23 Encounter for immunization: Secondary | ICD-10-CM | POA: Diagnosis not present

## 2016-01-18 DIAGNOSIS — Z0000 Encounter for general adult medical examination without abnormal findings: Secondary | ICD-10-CM | POA: Diagnosis not present

## 2016-01-18 DIAGNOSIS — Z6827 Body mass index (BMI) 27.0-27.9, adult: Secondary | ICD-10-CM | POA: Diagnosis not present

## 2016-01-18 DIAGNOSIS — E1165 Type 2 diabetes mellitus with hyperglycemia: Secondary | ICD-10-CM | POA: Diagnosis not present

## 2016-02-17 DIAGNOSIS — M79671 Pain in right foot: Secondary | ICD-10-CM | POA: Diagnosis not present

## 2016-02-17 DIAGNOSIS — M79674 Pain in right toe(s): Secondary | ICD-10-CM | POA: Diagnosis not present

## 2016-02-17 DIAGNOSIS — L600 Ingrowing nail: Secondary | ICD-10-CM | POA: Diagnosis not present

## 2016-02-17 DIAGNOSIS — L03031 Cellulitis of right toe: Secondary | ICD-10-CM | POA: Diagnosis not present

## 2016-03-02 DIAGNOSIS — L600 Ingrowing nail: Secondary | ICD-10-CM | POA: Diagnosis not present

## 2016-03-02 DIAGNOSIS — L03031 Cellulitis of right toe: Secondary | ICD-10-CM | POA: Diagnosis not present

## 2016-03-02 DIAGNOSIS — M79671 Pain in right foot: Secondary | ICD-10-CM | POA: Diagnosis not present

## 2016-03-02 DIAGNOSIS — M79674 Pain in right toe(s): Secondary | ICD-10-CM | POA: Diagnosis not present

## 2016-07-18 DIAGNOSIS — I10 Essential (primary) hypertension: Secondary | ICD-10-CM | POA: Diagnosis not present

## 2016-07-18 DIAGNOSIS — E039 Hypothyroidism, unspecified: Secondary | ICD-10-CM | POA: Diagnosis not present

## 2016-07-18 DIAGNOSIS — E1165 Type 2 diabetes mellitus with hyperglycemia: Secondary | ICD-10-CM | POA: Diagnosis not present

## 2016-07-21 DIAGNOSIS — I10 Essential (primary) hypertension: Secondary | ICD-10-CM | POA: Diagnosis not present

## 2016-07-21 DIAGNOSIS — E782 Mixed hyperlipidemia: Secondary | ICD-10-CM | POA: Diagnosis not present

## 2016-07-21 DIAGNOSIS — G3184 Mild cognitive impairment, so stated: Secondary | ICD-10-CM | POA: Diagnosis not present

## 2016-07-21 DIAGNOSIS — Z6827 Body mass index (BMI) 27.0-27.9, adult: Secondary | ICD-10-CM | POA: Diagnosis not present

## 2016-07-21 DIAGNOSIS — E1165 Type 2 diabetes mellitus with hyperglycemia: Secondary | ICD-10-CM | POA: Diagnosis not present

## 2016-10-10 DIAGNOSIS — R0902 Hypoxemia: Secondary | ICD-10-CM | POA: Diagnosis not present

## 2016-10-10 DIAGNOSIS — Z87891 Personal history of nicotine dependence: Secondary | ICD-10-CM | POA: Diagnosis not present

## 2016-10-10 DIAGNOSIS — Z23 Encounter for immunization: Secondary | ICD-10-CM | POA: Diagnosis not present

## 2016-10-10 DIAGNOSIS — R5383 Other fatigue: Secondary | ICD-10-CM | POA: Diagnosis not present

## 2016-10-24 ENCOUNTER — Other Ambulatory Visit: Payer: Self-pay

## 2016-12-20 DIAGNOSIS — Z23 Encounter for immunization: Secondary | ICD-10-CM | POA: Diagnosis not present

## 2017-01-18 DIAGNOSIS — E1165 Type 2 diabetes mellitus with hyperglycemia: Secondary | ICD-10-CM | POA: Diagnosis not present

## 2017-01-18 DIAGNOSIS — E039 Hypothyroidism, unspecified: Secondary | ICD-10-CM | POA: Diagnosis not present

## 2017-01-18 DIAGNOSIS — I10 Essential (primary) hypertension: Secondary | ICD-10-CM | POA: Diagnosis not present

## 2017-01-22 DIAGNOSIS — E039 Hypothyroidism, unspecified: Secondary | ICD-10-CM | POA: Diagnosis not present

## 2017-01-22 DIAGNOSIS — Z0000 Encounter for general adult medical examination without abnormal findings: Secondary | ICD-10-CM | POA: Diagnosis not present

## 2017-01-22 DIAGNOSIS — I10 Essential (primary) hypertension: Secondary | ICD-10-CM | POA: Diagnosis not present

## 2017-01-22 DIAGNOSIS — E119 Type 2 diabetes mellitus without complications: Secondary | ICD-10-CM | POA: Diagnosis not present

## 2017-01-22 DIAGNOSIS — E785 Hyperlipidemia, unspecified: Secondary | ICD-10-CM | POA: Diagnosis not present

## 2017-01-22 DIAGNOSIS — G3184 Mild cognitive impairment, so stated: Secondary | ICD-10-CM | POA: Diagnosis not present

## 2017-01-30 ENCOUNTER — Other Ambulatory Visit (HOSPITAL_COMMUNITY): Payer: Self-pay | Admitting: Internal Medicine

## 2017-01-30 DIAGNOSIS — Z1231 Encounter for screening mammogram for malignant neoplasm of breast: Secondary | ICD-10-CM

## 2017-01-30 DIAGNOSIS — Z780 Asymptomatic menopausal state: Secondary | ICD-10-CM

## 2017-02-08 ENCOUNTER — Ambulatory Visit (HOSPITAL_COMMUNITY): Payer: Medicare Other

## 2017-02-08 ENCOUNTER — Encounter (HOSPITAL_COMMUNITY): Payer: Self-pay

## 2017-02-08 ENCOUNTER — Other Ambulatory Visit (HOSPITAL_COMMUNITY): Payer: Medicare Other

## 2017-02-09 ENCOUNTER — Ambulatory Visit (HOSPITAL_COMMUNITY): Payer: Medicare Other

## 2017-05-15 DIAGNOSIS — F0391 Unspecified dementia with behavioral disturbance: Secondary | ICD-10-CM | POA: Diagnosis not present

## 2017-05-15 DIAGNOSIS — G3184 Mild cognitive impairment, so stated: Secondary | ICD-10-CM | POA: Diagnosis not present

## 2017-05-16 ENCOUNTER — Encounter (HOSPITAL_COMMUNITY): Payer: Self-pay

## 2017-05-16 ENCOUNTER — Inpatient Hospital Stay (HOSPITAL_COMMUNITY): Payer: Medicare Other

## 2017-05-16 ENCOUNTER — Inpatient Hospital Stay (HOSPITAL_COMMUNITY)
Admission: EM | Admit: 2017-05-16 | Discharge: 2017-05-18 | DRG: 641 | Disposition: A | Discharge status: Home / Self Care | Payer: Medicare Other | Attending: Family Medicine | Admitting: Family Medicine

## 2017-05-16 ENCOUNTER — Other Ambulatory Visit: Payer: Self-pay

## 2017-05-16 DIAGNOSIS — R531 Weakness: Secondary | ICD-10-CM | POA: Diagnosis not present

## 2017-05-16 DIAGNOSIS — R4182 Altered mental status, unspecified: Secondary | ICD-10-CM | POA: Diagnosis not present

## 2017-05-16 DIAGNOSIS — E860 Dehydration: Secondary | ICD-10-CM | POA: Diagnosis present

## 2017-05-16 DIAGNOSIS — R0683 Snoring: Secondary | ICD-10-CM | POA: Diagnosis present

## 2017-05-16 DIAGNOSIS — E039 Hypothyroidism, unspecified: Secondary | ICD-10-CM | POA: Diagnosis present

## 2017-05-16 DIAGNOSIS — F0390 Unspecified dementia without behavioral disturbance: Secondary | ICD-10-CM | POA: Diagnosis present

## 2017-05-16 DIAGNOSIS — R0602 Shortness of breath: Secondary | ICD-10-CM | POA: Diagnosis not present

## 2017-05-16 DIAGNOSIS — I10 Essential (primary) hypertension: Secondary | ICD-10-CM | POA: Diagnosis present

## 2017-05-16 DIAGNOSIS — D899 Disorder involving the immune mechanism, unspecified: Secondary | ICD-10-CM | POA: Diagnosis present

## 2017-05-16 DIAGNOSIS — D849 Immunodeficiency, unspecified: Secondary | ICD-10-CM | POA: Diagnosis not present

## 2017-05-16 DIAGNOSIS — Z79899 Other long term (current) drug therapy: Secondary | ICD-10-CM | POA: Diagnosis not present

## 2017-05-16 DIAGNOSIS — E871 Hypo-osmolality and hyponatremia: Principal | ICD-10-CM

## 2017-05-16 DIAGNOSIS — Z79890 Hormone replacement therapy (postmenopausal): Secondary | ICD-10-CM

## 2017-05-16 DIAGNOSIS — Z90710 Acquired absence of both cervix and uterus: Secondary | ICD-10-CM

## 2017-05-16 DIAGNOSIS — Z7984 Long term (current) use of oral hypoglycemic drugs: Secondary | ICD-10-CM | POA: Diagnosis not present

## 2017-05-16 DIAGNOSIS — E1151 Type 2 diabetes mellitus with diabetic peripheral angiopathy without gangrene: Secondary | ICD-10-CM | POA: Diagnosis present

## 2017-05-16 HISTORY — DX: Unspecified dementia, unspecified severity, without behavioral disturbance, psychotic disturbance, mood disturbance, and anxiety: F03.90

## 2017-05-16 LAB — URINALYSIS, ROUTINE W REFLEX MICROSCOPIC
Bilirubin Urine: NEGATIVE
GLUCOSE, UA: NEGATIVE mg/dL
Hgb urine dipstick: NEGATIVE
KETONES UR: NEGATIVE mg/dL
Leukocytes, UA: NEGATIVE
Nitrite: NEGATIVE
PROTEIN: NEGATIVE mg/dL
Specific Gravity, Urine: 1.008 (ref 1.005–1.030)
pH: 6 (ref 5.0–8.0)

## 2017-05-16 LAB — BASIC METABOLIC PANEL
Anion gap: 14 (ref 5–15)
BUN: 16 mg/dL (ref 6–20)
CHLORIDE: 82 mmol/L — AB (ref 101–111)
CO2: 26 mmol/L (ref 22–32)
CREATININE: 0.82 mg/dL (ref 0.44–1.00)
Calcium: 8.9 mg/dL (ref 8.9–10.3)
GFR calc Af Amer: >60 mL/min (ref >60)
GFR calc non Af Amer: >60 mL/min (ref >60)
GLUCOSE: 97 mg/dL (ref 65–99)
Potassium: 3.7 mmol/L (ref 3.5–5.1)
SODIUM: 122 mmol/L — AB (ref 135–145)

## 2017-05-16 LAB — HEPATIC FUNCTION PANEL
ALT: 14 U/L (ref 14–54)
AST: 18 U/L (ref 15–41)
Albumin: 4.4 g/dL (ref 3.5–5.0)
Alkaline Phosphatase: 71 U/L (ref 38–126)
BILIRUBIN DIRECT: 0.1 mg/dL (ref 0.1–0.5)
BILIRUBIN INDIRECT: 0.3 mg/dL (ref 0.3–0.9)
Total Bilirubin: 0.4 mg/dL (ref 0.3–1.2)
Total Protein: 7.1 g/dL (ref 6.5–8.1)

## 2017-05-16 LAB — CBC
HCT: 44.6 % (ref 36.0–46.0)
Hemoglobin: 14.4 g/dL (ref 12.0–15.0)
MCH: 30.2 pg (ref 26.0–34.0)
MCHC: 32.3 g/dL (ref 30.0–36.0)
MCV: 93.5 fL (ref 78.0–100.0)
PLATELETS: 282 10*3/uL (ref 150–400)
RBC: 4.77 MIL/uL (ref 3.87–5.11)
RDW: 12.5 % (ref 11.5–15.5)
WBC: 9.9 10*3/uL (ref 4.0–10.5)

## 2017-05-16 LAB — TSH: TSH: 2.224 u[IU]/mL (ref 0.350–4.500)

## 2017-05-16 LAB — TROPONIN I: Troponin I: <0.03 ng/mL (ref <0.03)

## 2017-05-16 MED ORDER — ACETAMINOPHEN 325 MG PO TABS
650.0000 mg | ORAL_TABLET | Freq: Four times a day (QID) | ORAL | Status: DC | PRN
  Administered 2017-05-17: 650 mg via ORAL
  Filled 2017-05-16: qty 2

## 2017-05-16 MED ORDER — METFORMIN HCL 500 MG PO TABS
500.0000 mg | ORAL_TABLET | Freq: Two times a day (BID) | ORAL | Status: DC
  Administered 2017-05-17 – 2017-05-18 (×3): 500 mg via ORAL
  Filled 2017-05-16 (×3): qty 1

## 2017-05-16 MED ORDER — HYDROCODONE-ACETAMINOPHEN 5-325 MG PO TABS: 1.0000 | ORAL_TABLET | Freq: Four times a day (QID) | ORAL | Status: DC | PRN

## 2017-05-16 MED ORDER — SODIUM CHLORIDE 0.9 % IV SOLN
Freq: Once | INTRAVENOUS | Status: AC
  Administered 2017-05-16: 22:00:00 via INTRAVENOUS

## 2017-05-16 MED ORDER — ACETAMINOPHEN 650 MG RE SUPP: 650.0000 mg | Freq: Four times a day (QID) | RECTAL | Status: DC | PRN

## 2017-05-16 MED ORDER — HYDRALAZINE HCL 20 MG/ML IJ SOLN: 10.0000 mg | Freq: Four times a day (QID) | INTRAMUSCULAR | Status: DC | PRN

## 2017-05-16 MED ORDER — ENOXAPARIN SODIUM 30 MG/0.3ML ~~LOC~~ SOLN: 30.0000 mg | SUBCUTANEOUS | Status: DC

## 2017-05-16 MED ORDER — NEBIVOLOL HCL 10 MG PO TABS
5.0000 mg | ORAL_TABLET | Freq: Every day | ORAL | Status: DC
  Administered 2017-05-17 – 2017-05-18 (×3): 5 mg via ORAL
  Filled 2017-05-16 (×6): qty 1

## 2017-05-16 MED ORDER — LEVOTHYROXINE SODIUM 25 MCG PO TABS
25.0000 ug | ORAL_TABLET | Freq: Every day | ORAL | Status: DC
  Administered 2017-05-17 – 2017-05-18 (×2): 25 ug via ORAL
  Filled 2017-05-16 (×2): qty 1

## 2017-05-16 MED ORDER — SODIUM CHLORIDE 0.9 % IV SOLN
INTRAVENOUS | Status: DC
  Administered 2017-05-16 – 2017-05-17 (×2): via INTRAVENOUS

## 2017-05-16 MED ORDER — ENOXAPARIN SODIUM 40 MG/0.4ML ~~LOC~~ SOLN
40.0000 mg | SUBCUTANEOUS | Status: DC
  Administered 2017-05-17: 40 mg via SUBCUTANEOUS
  Filled 2017-05-16 (×2): qty 0.4

## 2017-05-16 MED ORDER — LEVOTHYROXINE SODIUM 25 MCG PO TABS: 25.0000 ug | ORAL_TABLET | Freq: Every day | ORAL | Status: DC

## 2017-05-16 MED ORDER — METFORMIN HCL 500 MG PO TABS: 500.0000 mg | ORAL_TABLET | Freq: Two times a day (BID) | ORAL | Status: DC

## 2017-05-16 NOTE — ED Triage Notes (Addendum)
Patient reports of generalized weakness, increased sleeping and hypoxia per family. Patient has had medication changes per family. Sats 98% on RA. NAD noted.

## 2017-05-16 NOTE — H&P (Addendum)
TRH H&P   Patient Demographics:    Paula Andrews, is a 72 y.o. female  MRN: [id number]   DOB - 14-Oct-1945  Admit Date - 05/16/2017  Outpatient Primary MD for the patient is Celene Squibb, MD  Referring MD/NP/PA:  Rod Holler  Outpatient Specialists:      Patient coming from: home  Chief Complaint  Patient presents with  . Weakness      HPI:    Paula Andrews  is a 72 y.o. female, w dementia, hypertension, apparently has had generalized weakness per family. Increasing over the past few weeks.   Pt is unable to provide meaningful history due to dementia.    In Ed,  CXR pending  Na 122, K 3.7, Bun 0.82  Wbc 9.9, Hgb 14.4, Plt 282  Urinalysis pending Trop pending LFT pending Serum osm pending, cortisol pending, tsh pending Urine sodium and urine osm pending.   CT brain pending  Pt will be admitted for weakness and hyponatremia    Review of systems:    In addition to the HPI above,  Pt unable to provide due to dementia   With Past History of the following :    Past Medical History:  Diagnosis Date  . Dementia   . Diabetes mellitus   . Hypertension   . Thyroid disease       Past Surgical History:  Procedure Laterality Date  . ABDOMINAL HYSTERECTOMY        Social History:     Social History   Tobacco Use  . Smoking status: Never Smoker  . Smokeless tobacco: Never Used  Substance Use Topics  . Alcohol use: No     Lives - at home  Mobility - walks by self   Family History :     Family History  Problem Relation Age of Onset  . Diabetes Unknown       Home Medications:   Prior to Admission medications   Medication Sig Start Date End Date Taking? Authorizing Provider  Ascorbic Acid (VITAMIN C) 1000 MG tablet Take 1,000 mg by mouth daily.    [provider]  Calcium-Vitamin D 600-200 MG-UNIT per tablet Take 1 tablet by  mouth daily.    [provider]  HYDROcodone-acetaminophen (NORCO/VICODIN) 5-325 MG per tablet Take 1 tablet by mouth every 6 (six) hours as needed for pain. 01/28/12   Milton Ferguson, MD  ibuprofen (ADVIL,MOTRIN) 200 MG tablet Take 200 mg by mouth every 6 (six) hours as needed. Pain    [provider]  levothyroxine (SYNTHROID, LEVOTHROID) 25 MCG tablet Take 25 mcg by mouth daily.    [provider]  losartan-hydrochlorothiazide (HYZAAR) 100-25 MG per tablet Take 1 tablet by mouth daily.    [provider]  metFORMIN (GLUCOPHAGE) 500 MG tablet Take 500 mg by mouth 2 (two) times daily.    [provider]  nebivolol (BYSTOLIC) 5 MG tablet Take 5 mg by mouth daily.    [provider]  OVER THE COUNTER MEDICATION Place 1 drop into both eyes daily as needed. Eye Drop used for allergies    [provider]     Allergies:    No Known Allergies   Physical Exam:   Vitals  Blood pressure (!) 149/71, pulse 67, temperature 97.8 F (36.6 C), temperature source Oral, resp. rate 17, height 5\' 5"  (1.651 m), weight 68.9 kg (152 lb), SpO2 90 %.   1. General  lying in bed in NAD,   2. Normal affect and insight, Not Suicidal or Homicidal, Awake Alert, Oriented X 3.  3. No F.N deficits, ALL C.Nerves Intact, Strength 5/5 all 4 extremities, Sensation intact all 4 extremities, Plantars down going.  4. Ears and Eyes appear Normal, Conjunctivae clear, PERRLA. Moist Oral Mucosa.  5. Supple Neck, No JVD, No cervical lymphadenopathy appriciated, No Carotid Bruits.  6. Symmetrical Chest wall movement, Good air movement bilaterally, CTAB.  7. RRR, No Gallops, Rubs or Murmurs, No Parasternal Heave.  8. Positive Bowel Sounds, Abdomen Soft, No tenderness, No organomegaly appriciated,No rebound -guarding or rigidity.  9.  No Cyanosis, Normal Skin Turgor, No Skin Rash or Bruise.  10. Good muscle tone,  joints appear normal , no effusions, Normal  ROM.  11. No Palpable Lymph Nodes in Neck or Axillae     Data Review:    CBC Recent Labs  Lab 05/16/17 1747  WBC 9.9  HGB 14.4  HCT 44.6  PLT 282  MCV 93.5  MCH 30.2  MCHC 32.3  RDW 12.5   ------------------------------------------------------------------------------------------------------------------  Chemistries  Recent Labs  Lab 05/16/17 1747  NA 122*  K 3.7  CL 82*  CO2 26  GLUCOSE 97  BUN 16  CREATININE 0.82  CALCIUM 8.9   ------------------------------------------------------------------------------------------------------------------ estimated creatinine clearance is 61.4 mL/min (by C-G formula based on SCr of 0.82 mg/dL). ------------------------------------------------------------------------------------------------------------------ No results for input(s): TSH, T4TOTAL, T3FREE, THYROIDAB in the last 72 hours.  Invalid input(s): FREET3  Coagulation profile No results for input(s): INR, PROTIME in the last 168 hours. ------------------------------------------------------------------------------------------------------------------- No results for input(s): DDIMER in the last 72 hours. -------------------------------------------------------------------------------------------------------------------  Cardiac Enzymes No results for input(s): CKMB, TROPONINI, MYOGLOBIN in the last 168 hours.  Invalid input(s): CK ------------------------------------------------------------------------------------------------------------------ No results found for: BNP   ---------------------------------------------------------------------------------------------------------------  Urinalysis No results found for: COLORURINE, APPEARANCEUR, LABSPEC, [place], GLUCOSEU, HGBUR, BILIRUBINUR, KETONESUR, PROTEINUR, UROBILINOGEN, NITRITE, LEUKOCYTESUR  ----------------------------------------------------------------------------------------------------------------   Imaging  Results:    No results found.     Assessment & Plan:    Principal Problem:   Hyponatremia    Hyponatremia Check serum osm, cortisol, tsh Check urine osm, urine sodium STOP losartan/hydrochlorothiazide Hydrate with ns iv  Weakness Check CT brain Check CXR Check ua  ? Snoring Check sleep study as outpatient, suspect OSA  Hypertension Cont bystolic  Hypothyroidism Cont levothyroxine  Dm2 Cont Metformin fsbs ac and qhs, ISS   DVT Prophylaxis   Lovenox - SCDs  AM Labs Ordered, also please review Full Orders  Family Communication: Admission, patients condition and plan of care including tests being ordered have been discussed with the patient who indicate understanding and agree with the plan and Code Status.  Code Status FULL CODE  Likely DC to  home  Condition GUARDED  Consults called: none  Admission status: inpatient   Time spent in minutes : 45   Jani Gravel M.D on 05/16/2017 at 10:08 PM  Between 7am  to 7pm - Pager - [phone]  . After 7pm go to [email or web address] - password [facility]  [facility] - Office  [phone]

## 2017-05-16 NOTE — ED Notes (Signed)
Report given to 300 at this time.  

## 2017-05-16 NOTE — ED Provider Notes (Signed)
[facility] EMERGENCY DEPARTMENT Provider Note   CSN: [id number] Arrival date & time: 05/16/17  1659     History   Chief Complaint Chief Complaint  Patient presents with  . Weakness   Level 5 caveat dementia history is obtained from patient and from patient's daughter and husband who accompany her HPI Paula Andrews is a 72 y.o. female.  He has been sleeping more over the past 2 days.  Her dementia medicines were recently adjusted as she was somewhat agitated last week, getting in the car drive.  Patient is presently asymptomatic alert and states she is hungry.  She denies pain anywhere.  Denies shortness of breath.  Her daughter reports that her pulse oximetry was low at doctor's office earlier today.  No fever.  No other associated symptoms patient denies pain anywhere.  HPI  Past Medical History:  Diagnosis Date  . Dementia   . Diabetes mellitus   . Hypertension   . Thyroid disease     Patient Active Problem List   Diagnosis Date Noted  . Fracture of radial neck, left, closed 01/29/2012  . CLOSED FRACTURE UNSPEC PART UPPER END HUMERUS 04/13/2010    Past Surgical History:  Procedure Laterality Date  . ABDOMINAL HYSTERECTOMY      OB History    No data available       Home Medications    Prior to Admission medications   Medication Sig Start Date End Date Taking? Authorizing Provider  Ascorbic Acid (VITAMIN C) 1000 MG tablet Take 1,000 mg by mouth daily.    [provider]  Calcium-Vitamin D 600-200 MG-UNIT per tablet Take 1 tablet by mouth daily.    [provider]  HYDROcodone-acetaminophen (NORCO/VICODIN) 5-325 MG per tablet Take 1 tablet by mouth every 6 (six) hours as needed for pain. 01/28/12   Milton Ferguson, MD  ibuprofen (ADVIL,MOTRIN) 200 MG tablet Take 200 mg by mouth every 6 (six) hours as needed. Pain    [provider]  levothyroxine (SYNTHROID, LEVOTHROID) 25 MCG tablet Take 25 mcg by mouth daily.    [provider]  losartan-hydrochlorothiazide (HYZAAR) 100-25 MG per tablet Take 1 tablet by mouth daily.    [provider]  metFORMIN (GLUCOPHAGE) 500 MG tablet Take 500 mg by mouth 2 (two) times daily.    [provider]  nebivolol (BYSTOLIC) 5 MG tablet Take 5 mg by mouth daily.    [provider]  OVER THE COUNTER MEDICATION Place 1 drop into both eyes daily as needed. Eye Drop used for allergies    [provider]    Family History Family History  Problem Relation Age of Onset  . Diabetes Unknown     Social History Social History   Tobacco Use  . Smoking status: Never Smoker  . Smokeless tobacco: Never Used  Substance Use Topics  . Alcohol use: No  . Drug use: No     Allergies   Patient has no known allergies.   Review of Systems Review of Systems  Unable to perform ROS: Dementia  Allergic/Immunologic: Positive for immunocompromised state.       Diabetic  Neurological: Positive for weakness.       Generalized weakness, sleeping more     Physical Exam Updated Vital Signs BP (!) 149/71   Pulse 67   Temp 97.8 F (36.6 C) (Oral)   Resp 17   Ht 5\' 5"  (1.651 m)   Wt 68.9 kg (152 lb)   SpO2 90%  BMI 25.29 kg/m   Physical Exam  Constitutional: She appears well-developed and well-nourished. No distress.  HENT:  Head: Normocephalic and atraumatic.  No facial asymmetry  Eyes: Conjunctivae are normal. Pupils are equal, round, and reactive to light.  Neck: Neck supple. No tracheal deviation present. No thyromegaly present.  Cardiovascular: Normal rate and regular rhythm.  No murmur heard. Pulmonary/Chest: Effort normal and breath sounds normal.  Abdominal: Soft. Bowel sounds are normal. She exhibits no distension. There is no tenderness.  Musculoskeletal: Normal range of motion. She exhibits no edema or tenderness.  4 extremities without redness swelling or tenderness neurovascular intact  Neurological: She is alert. No cranial nerve  deficit. Coordination normal.  Alert awake moves all extremities well cranial nerves II through XII grossly intact gait normal motor motor strength 5/5 overall  Skin: Skin is warm and dry. No rash noted.  Psychiatric: She has a normal mood and affect.  Nursing note and vitals reviewed.    ED Treatments / Results  Labs (all labs ordered are listed, but only abnormal results are displayed) Labs Reviewed  BASIC METABOLIC PANEL - Abnormal; Notable for the following components:      Result Value   Sodium 122 (*)    Chloride 82 (*)    All other components within normal limits  CBC  URINALYSIS, ROUTINE W REFLEX MICROSCOPIC  CBC WITH DIFFERENTIAL/PLATELET  TROPONIN I    EKG  EKG Interpretation None       Radiology No results found.  Procedures Procedures (including critical care time)  Medications Ordered in ED Medications - No data to display Results for orders placed or performed during the hospital encounter of 82/95/62  Basic metabolic panel  Result Value Ref Range   Sodium 122 (L) 135 - 145 mmol/L   Potassium 3.7 3.5 - 5.1 mmol/L   Chloride 82 (L) 101 - 111 mmol/L   CO2 26 22 - 32 mmol/L   Glucose, Bld 97 65 - 99 mg/dL   BUN 16 6 - 20 mg/dL   Creatinine, Ser 0.82 0.44 - 1.00 mg/dL   Calcium 8.9 8.9 - 10.3 mg/dL   GFR calc non Af Amer >60 >60 mL/min   GFR calc Af Amer >60 >60 mL/min   Anion gap 14 5 - 15  CBC  Result Value Ref Range   WBC 9.9 4.0 - 10.5 K/uL   RBC 4.77 3.87 - 5.11 MIL/uL   Hemoglobin 14.4 12.0 - 15.0 g/dL   HCT 44.6 36.0 - 46.0 %   MCV 93.5 78.0 - 100.0 fL   MCH 30.2 26.0 - 34.0 pg   MCHC 32.3 30.0 - 36.0 g/dL   RDW 12.5 11.5 - 15.5 %   Platelets 282 150 - 400 K/uL   No results found.  Initial Impression / Assessment and Plan / ED Course  I have reviewed the triage vital signs and the nursing notes.  Pertinent labs & imaging results that were available during my care of the patient were reviewed by me and considered in my medical  decision making (see chart for details).     10:05 PM patient sitting up in bed eating a meal.  Resting comfortably.  Alert and appears in no distress. IV hydration with normal saline ordered by me to correct hyponatremia.  Dr. Maudie Mercury consulted and will arrange for overnight stay.  Final Clinical Impressions(s) / ED Diagnoses  Diagnoses #1 hyponatremia #2 generalized weakness Final diagnoses:  None    ED Discharge Orders  None       Orlie Dakin, MD 05/16/17 2213

## 2017-05-17 ENCOUNTER — Inpatient Hospital Stay (HOSPITAL_COMMUNITY): Payer: Medicare Other

## 2017-05-17 DIAGNOSIS — E860 Dehydration: Secondary | ICD-10-CM

## 2017-05-17 DIAGNOSIS — E871 Hypo-osmolality and hyponatremia: Principal | ICD-10-CM

## 2017-05-17 DIAGNOSIS — I10 Essential (primary) hypertension: Secondary | ICD-10-CM

## 2017-05-17 LAB — CBC
HCT: 43.4 % (ref 36.0–46.0)
Hemoglobin: 14.1 g/dL (ref 12.0–15.0)
MCH: 30.3 pg (ref 26.0–34.0)
MCHC: 32.5 g/dL (ref 30.0–36.0)
MCV: 93.3 fL (ref 78.0–100.0)
PLATELETS: 257 10*3/uL (ref 150–400)
RBC: 4.65 MIL/uL (ref 3.87–5.11)
RDW: 12.7 % (ref 11.5–15.5)
WBC: 7.8 10*3/uL (ref 4.0–10.5)

## 2017-05-17 LAB — COMPREHENSIVE METABOLIC PANEL
ALT: 12 U/L — AB (ref 14–54)
AST: 16 U/L (ref 15–41)
Albumin: 3.8 g/dL (ref 3.5–5.0)
Alkaline Phosphatase: 68 U/L (ref 38–126)
Anion gap: 13 (ref 5–15)
BUN: 16 mg/dL (ref 6–20)
CALCIUM: 8.9 mg/dL (ref 8.9–10.3)
CO2: 25 mmol/L (ref 22–32)
CREATININE: 0.75 mg/dL (ref 0.44–1.00)
Chloride: 92 mmol/L — ABNORMAL LOW (ref 101–111)
Glucose, Bld: 122 mg/dL — ABNORMAL HIGH (ref 65–99)
Potassium: 3.5 mmol/L (ref 3.5–5.1)
Sodium: 130 mmol/L — ABNORMAL LOW (ref 135–145)
Total Bilirubin: 0.4 mg/dL (ref 0.3–1.2)
Total Protein: 6.4 g/dL — ABNORMAL LOW (ref 6.5–8.1)

## 2017-05-17 LAB — OSMOLALITY: Osmolality: 275 mOsm/kg (ref 275–295)

## 2017-05-17 LAB — CORTISOL: CORTISOL PLASMA: 8.8 ug/dL

## 2017-05-17 MED ORDER — MEMANTINE HCL ER 7 MG PO CP24
21.0000 mg | ORAL_CAPSULE | Freq: Once | ORAL | Status: AC
  Administered 2017-05-17: 21 mg via ORAL
  Filled 2017-05-17: qty 1

## 2017-05-17 MED ORDER — AMLODIPINE BESYLATE 5 MG PO TABS
5.0000 mg | ORAL_TABLET | Freq: Every day | ORAL | Status: DC
  Administered 2017-05-17 – 2017-05-18 (×2): 5 mg via ORAL
  Filled 2017-05-17 (×2): qty 1

## 2017-05-17 NOTE — Progress Notes (Signed)
PROGRESS NOTE    Paula Andrews  FGH:[id number]  DOB: 04-01-1946  DOA: 05/16/2017 PCP: Celene Squibb, MD   Brief Admission Hx: Paula Andrews  is a 72 y.o. female, w dementia, hypertension, apparently has had generalized weakness per family. Increasing over the past few weeks.  The patient was admitted with dehydration and hyponatremia with a sodium of 122.  MDM/Assessment & Plan:   1. Hyponatremia-suspect secondary to dehydration and losartan hydrochlorothiazide which has been discontinued.  Continue to hydrate with normal saline IV.  The patient is clinically improving. 2. Generalized weakness-multifactorial but likely exacerbated by hyponatremia and dehydration.  She seems to be improving we will ask for a PT evaluation. 3. Essential hypertension-Bystolic and amlodipine, holding losartan HCT. 4. Hypothyroidism-continue home levothyroxine dosing. 5. Type 2 diabetes mellitus- she was restarted on metformin and sliding scale coverage as ordered as well. 6. Snoring- needs an outpatient sleep study to evaluate for OSA. 7. Dementia - family remaining at bedside to assist with orienting patient.   DVT prophylaxis: SCDs and Lovenox Code Status: Full Family Communication: Daughter bedside Disposition Plan: Home tomorrow if stable  Subjective: Patient sitting up and wanting to eat breakfast.  She says she is feeling better.  She is ambulating in the room.  Objective: Vitals:   05/16/17 2324 05/16/17 2326 05/17/17 0500 05/17/17 0609  BP:  (!) 139/49  (!) 155/75  Pulse:  80  77  Resp:  18  18  Temp:  (!) 97.5 F (36.4 C)  97.7 F (36.5 C)  TempSrc:  Oral  Oral  SpO2:  91%  92%  Weight: 73.2 kg (161 lb 6 oz)  73.2 kg (161 lb 6 oz)   Height: 5\' 5"  (1.651 m)       Intake/Output Summary (Last 24 hours) at 05/17/2017 1016 Last data filed at 05/17/2017 0301 Gross per 24 hour  Intake 353.75 ml  Output -  Net 353.75 ml   Filed Weights   05/16/17 1716 05/16/17 2324 05/17/17 0500    Weight: 68.9 kg (152 lb) 73.2 kg (161 lb 6 oz) 73.2 kg (161 lb 6 oz)     REVIEW OF SYSTEMS  As per history otherwise all reviewed and reported negative  Exam:  General exam: awake, alert, NAD, cooperative.  Respiratory system: Clear. No increased work of breathing. Cardiovascular system: S1 & S2 heard, RRR. No JVD, murmurs, gallops, clicks or pedal edema. Gastrointestinal system: Abdomen is nondistended, soft and nontender. Normal bowel sounds heard. Central nervous system: Alert and oriented x2. No focal neurological deficits. Extremities: no CCE.  Data Reviewed: Basic Metabolic Panel: Recent Labs  Lab 05/16/17 1747 05/17/17 0414  NA 122* 130*  K 3.7 3.5  CL 82* 92*  CO2 26 25  GLUCOSE 97 122*  BUN 16 16  CREATININE 0.82 0.75  CALCIUM 8.9 8.9   Liver Function Tests: Recent Labs  Lab 05/16/17 2150 05/17/17 0414  AST 18 16  ALT 14 12*  ALKPHOS 71 68  BILITOT 0.4 0.4  PROT 7.1 6.4*  ALBUMIN 4.4 3.8   No results for input(s): LIPASE, AMYLASE in the last 168 hours. No results for input(s): AMMONIA in the last 168 hours. CBC: Recent Labs  Lab 05/16/17 1747 05/17/17 0414  WBC 9.9 7.8  HGB 14.4 14.1  HCT 44.6 43.4  MCV 93.5 93.3  PLT 282 257   Cardiac Enzymes: Recent Labs  Lab 05/16/17 2150  TROPONINI <0.03   CBG (last 3)  No results for input(s): GLUCAP in  the last 72 hours. No results found for this or any previous visit (from the past 240 hour(s)).   Studies: Dg Chest 2 View  Result Date: 05/16/2017 CLINICAL DATA:  72 year old female with hyponatremia, weakness, shortness of breath. EXAM: CHEST  2 VIEW COMPARISON:  Chest CTA 07/15/2011 and earlier. FINDINGS: Upright AP and lateral views of the chest. Mediastinal contours remain normal. Calcified aortic atherosclerosis. Visualized tracheal air column is within normal limits. Stable lung volumes and mild diffuse increased pulmonary interstitial markings. No pneumothorax, pulmonary edema, pleural  effusion or acute pulmonary opacity. No acute osseous abnormality identified. Negative visible bowel gas pattern. IMPRESSION: No acute cardiopulmonary abnormality. Electronically Signed   By: Genevie Ann M.D.   On: 05/16/2017 23:13   Ct Head Wo Contrast  Result Date: 05/16/2017 CLINICAL DATA:  Generalized weakness and altered mental status for a few weeks. History of dementia, hypertension, diabetes. EXAM: CT HEAD WITHOUT CONTRAST TECHNIQUE: Contiguous axial images were obtained from the base of the skull through the vertex without intravenous contrast. COMPARISON:  None. FINDINGS: BRAIN: No intraparenchymal hemorrhage, mass effect nor midline shift. The ventricles and sulci are normal for age. Confluent supratentorial white matter hypodensities. Small area LEFT mesial occipital lobe encephalomalacia. No acute large vascular territory infarcts. No abnormal extra-axial fluid collections. Basal cisterns are patent. Empty sella. VASCULAR: Moderate calcific atherosclerosis of the carotid siphons. SKULL: No skull fracture. No significant scalp soft tissue swelling. SINUSES/ORBITS: The mastoid air-cells and included paranasal sinuses are well-aerated.The included ocular globes and orbital contents are non-suspicious. OTHER: None. IMPRESSION: 1. No acute intracranial process. 2. 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 3. Moderate to severe chronic small vessel ischemic disease. Electronically Signed   By: Elon Alas M.D.   On: 05/16/2017 22:53     Scheduled Meds: . amLODipine  5 mg Oral Daily  . enoxaparin (LOVENOX) injection  40 mg Subcutaneous Q24H  . levothyroxine  25 mcg Oral QAC breakfast  . metFORMIN  500 mg Oral BID WC  . nebivolol  5 mg Oral Daily   Continuous Infusions: . sodium chloride 50 mL/hr at 05/17/17 0932    Principal Problem:   Hyponatremia   Time spent:   Irwin Brakeman, MD, FAAFP [facility] Pager [phone]  If 7PM-7AM, please contact  night-coverage [email or web address] Password [email or web address] 05/17/2017, 10:16 AM    LOS: 1 day

## 2017-05-17 NOTE — Progress Notes (Signed)
Husband checked 02 sat with his pulse ox and said was 80%.  Dinapmap read 92% and paitent was alert and oriented at the time and denied sob.  Viitals 98  137/52  71  16  92% on room air

## 2017-05-17 NOTE — Evaluation (Signed)
Occupational Therapy Evaluation Patient Details Name: Paula Andrews MRN: [id number] DOB: 1945/09/23 Today's Date: 05/17/2017    History of Present Illness Paula Andrews  is a 72 y.o. female, w dementia, hypertension, apparently has had generalized weakness per family. Increasing over the past few weeks.    Clinical Impression   Pt received supine in bed, daughter present, agreeable to evaluation. Pt in early stages of dementia, daughter able to confirm pt reports for PLOF. PTA pt independent with B/IADLs and mobility, no DME at home. Pt demonstrates independence with B/ADLs during evaluation, as well as independence with bed mobility and transfers. Daughter lives close by and is able to assist if/when necessary. No further OT services required at this time.     Follow Up Recommendations  No OT follow up;Supervision - Intermittent    Equipment Recommendations  None recommended by OT       Precautions / Restrictions Precautions Precautions: None Restrictions Weight Bearing Restrictions: No      Mobility Bed Mobility Overal bed mobility: Modified Independent                Transfers Overall transfer level: Modified independent Equipment used: None                      ADL either performed or assessed with clinical judgement   ADL Overall ADL's : Modified independent;At baseline                                       General ADL Comments: No difficulty with seated or standing tasks     Vision Baseline Vision/History: Wears glasses Wears Glasses: At all times Patient Visual Report: No change from baseline Vision Assessment?: No apparent visual deficits            Pertinent Vitals/Pain Pain Assessment: No/denies pain     Hand Dominance Right   Extremity/Trunk Assessment Upper Extremity Assessment Upper Extremity Assessment: Overall WFL for tasks assessed   Lower Extremity Assessment Lower Extremity Assessment: Defer to PT  evaluation   Cervical / Trunk Assessment Cervical / Trunk Assessment: Normal   Communication Communication Communication: No difficulties   Cognition Arousal/Alertness: Awake/alert Behavior During Therapy: WFL for tasks assessed/performed Overall Cognitive Status: History of cognitive impairments - at baseline                                                Home Living Family/patient expects to be discharged to:: Private residence Living Arrangements: Spouse/significant other Available Help at Discharge: Family;Available 24 hours/day Type of Home: House Home Access: Stairs to enter [employer] of Steps: 3 Entrance Stairs-Rails: None Home Layout: One level     Bathroom Shower/Tub: Teacher, early years/pre: Standard     Home Equipment: None          Prior Functioning/Environment Level of Independence: Independent                 OT Problem List: Decreased cognition       End of Session    Activity Tolerance: Patient tolerated treatment well Patient left: in bed;with call bell/phone within reach;with family/visitor present  OT Visit Diagnosis: Muscle weakness (generalized) (M62.81)  Time: 5868-2574 OT Time Calculation (min): 14 min Charges:  OT General Charges $OT Visit: 1 Visit OT Evaluation $OT Eval Low Complexity: [place], OTR/L  [phone] 05/17/2017, 8:44 AM

## 2017-05-17 NOTE — Progress Notes (Signed)
Walked with PT and diid stairs.  Denied dizziness.  Alert and oriented x 3.  Daughter at bedside.

## 2017-05-17 NOTE — Evaluation (Signed)
Physical Therapy Evaluation Patient Details Name: Paula Andrews MRN: [id number] DOB: May 17, 1945 Today's Date: 05/17/2017   History of Present Illness  Paula Andrews  is a 72 y.o. female, w dementia, hypertension, apparently has had generalized weakness per family. Increasing over the past few weeks.     Clinical Impression  Patient functioning at baseline for functional mobility and gait.  PLAN: patient discharged from physical therapy to care of nursing for ambulation daily as tolerated for length of stay.    Follow Up Recommendations No PT follow up    Equipment Recommendations  None recommended by PT    Recommendations for Other Services       Precautions / Restrictions Precautions Precautions: None Restrictions Weight Bearing Restrictions: No      Mobility  Bed Mobility Overal bed mobility: Modified Independent                Transfers Overall transfer level: Modified independent Equipment used: None                Ambulation/Gait Ambulation/Gait assistance: Independent Ambulation Distance (Feet): 150 Feet Assistive device: None Gait Pattern/deviations: WFL(Within Functional Limits)   Gait velocity interpretation: at or above normal speed for age/gender    Stairs Stairs: Yes Stairs assistance: Supervision Stair Management: One rail Left;Alternating pattern Number of Stairs: 4 General stair comments: Patient demonstrates good return for going up/down 4 steps using left siderail without loss of balance  Wheelchair Mobility    Modified Rankin (Stroke Patients Only)       Balance Overall balance assessment: No apparent balance deficits (not formally assessed)                                           Pertinent Vitals/Pain Pain Assessment: No/denies pain    Home Living Family/patient expects to be discharged to:: Private residence Living Arrangements: Spouse/significant other Available Help at Discharge:  Family;Available 24 hours/day Type of Home: House Home Access: Stairs to enter Entrance Stairs-Rails: None Entrance Stairs-Number of Steps: 4 (back entrance)(has side entrace with 3 steps and bilateral siderails that patient can reach both) Home Layout: One level Home Equipment: Cane - single point;Other (comment) Additional Comments: has access to RW that her daughter owns    Prior Function Level of Independence: Independent               Hand Dominance   Dominant Hand: Right    Extremity/Trunk Assessment   Upper Extremity Assessment Upper Extremity Assessment: Defer to OT evaluation    Lower Extremity Assessment Lower Extremity Assessment: Overall WFL for tasks assessed    Cervical / Trunk Assessment Cervical / Trunk Assessment: Normal  Communication   Communication: No difficulties  Cognition Arousal/Alertness: Awake/alert Behavior During Therapy: WFL for tasks assessed/performed Overall Cognitive Status: Within Functional Limits for tasks assessed                                        General Comments      Exercises     Assessment/Plan    PT Assessment Patent does not need any further PT services  PT Problem List         PT Treatment Interventions      PT Goals (Current goals can be found in the Care Plan section)  Acute  Rehab PT Goals Patient Stated Goal: return home PT Goal Formulation: With patient/family Time For Goal Achievement: 05-21-2017 Potential to Achieve Goals: Good    Frequency     Barriers to discharge        Co-evaluation               AM-PAC PT "6 Clicks" Daily Activity  Outcome Measure Difficulty turning over in bed (including adjusting bedclothes, sheets and blankets)?: None Difficulty moving from lying on back to sitting on the side of the bed? : None Difficulty sitting down on and standing up from a chair with arms (e.g., wheelchair, bedside commode, etc,.)?: None Help needed moving to and from a  bed to chair (including a wheelchair)?: None Help needed walking in hospital room?: None Help needed climbing 3-5 steps with a railing? : None 6 Click Score: 24    End of Session   Activity Tolerance: Patient tolerated treatment well Patient left: in bed;with family/visitor present;with call bell/phone within reach;with nursing/sitter in room(seated at bedside) Nurse Communication: Mobility status PT Visit Diagnosis: Unsteadiness on feet (R26.81);Other abnormalities of gait and mobility (R26.89);Muscle weakness (generalized) (M62.81)    Time: 6333-5456 PT Time Calculation (min) (ACUTE ONLY): 24 min   Charges:   PT Evaluation $PT Eval Low Complexity: 1 Low PT Treatments $Therapeutic Activity: 23-37 mins   PT G Codes:        11:07 AM, 2017-05-21 Lonell Grandchild, MPT Physical Therapist with [facility] [phone] office [phone] mobile phone

## 2017-05-18 DIAGNOSIS — R531 Weakness: Secondary | ICD-10-CM

## 2017-05-18 DIAGNOSIS — F0390 Unspecified dementia without behavioral disturbance: Secondary | ICD-10-CM

## 2017-05-18 LAB — CBC
HCT: 43 % (ref 36.0–46.0)
Hemoglobin: 13.6 g/dL (ref 12.0–15.0)
MCH: 30 pg (ref 26.0–34.0)
MCHC: 31.6 g/dL (ref 30.0–36.0)
MCV: 94.7 fL (ref 78.0–100.0)
PLATELETS: 232 10*3/uL (ref 150–400)
RBC: 4.54 MIL/uL (ref 3.87–5.11)
RDW: 12.8 % (ref 11.5–15.5)
WBC: 5.8 10*3/uL (ref 4.0–10.5)

## 2017-05-18 LAB — BASIC METABOLIC PANEL
Anion gap: 9 (ref 5–15)
BUN: 13 mg/dL (ref 6–20)
CALCIUM: 9 mg/dL (ref 8.9–10.3)
CO2: 28 mmol/L (ref 22–32)
Chloride: 101 mmol/L (ref 101–111)
Creatinine, Ser: 0.67 mg/dL (ref 0.44–1.00)
GFR calc non Af Amer: >60 mL/min (ref >60)
Glucose, Bld: 126 mg/dL — ABNORMAL HIGH (ref 65–99)
Potassium: 4.1 mmol/L (ref 3.5–5.1)
SODIUM: 138 mmol/L (ref 135–145)

## 2017-05-18 MED ORDER — AMLODIPINE BESYLATE 5 MG PO TABS: 5.0000 mg | ORAL_TABLET | Freq: Every day | ORAL | 0 refills | Status: DC

## 2017-05-18 MED ORDER — LISINOPRIL 10 MG PO TABS: 10.0000 mg | ORAL_TABLET | Freq: Every day | ORAL | 0 refills | Status: DC

## 2017-05-18 NOTE — Discharge Summary (Signed)
Physician Discharge Summary  Paula Andrews TIR:[id number] DOB: 1945-10-07 DOA: 05/16/2017  PCP: Celene Squibb, MD  Admit date: 05/16/2017 Discharge date: 05/18/2017  Admitted From: Home  Disposition: Home  Recommendations for Outpatient Follow-up:  1. Follow up with PCP in 1 weeks 2. Please obtain BMP/CBC in one week  Discharge Condition: STABLE   CODE STATUS: FULL    Brief Hospitalization Summary: Please see all hospital notes, images, labs for full details of the hospitalization.  Brief Admission Hx: AnnieMilleris a22 y.o.female,w dementia, hypertension, apparently has had generalized weakness per family.Increasing over the past few weeks.  The patient was admitted with dehydration and hyponatremia with a sodium of 122.  MDM/Assessment & Plan:   1. Hyponatremia-suspect secondary to dehydration and lisinopril hydrochlorothiazide which has been discontinued.  Continue to hydrate with normal saline IV.  The patient is clinically improving. 2. Generalized weakness-multifactorial but likely exacerbated by hyponatremia and dehydration.  She seems to be improving we will ask for a PT evaluation and PT recommended no further follow up needs.  CT and MRI brain no acute findings.   3. Essential hypertension-added amlodipine 5 mg, lisinopril, DC HCTZ.  Follow up with PCP for recheck.  4. Hypothyroidism-continue home levothyroxine dosing. 5. Type 2 diabetes mellitus- she was restarted on metformin and sliding scale coverage as ordered as well. 6. Snoring- needs an outpatient sleep study to evaluate for OSA. 7. Dementia - family remaining at bedside to assist with orienting patient. Resume home medications.   DVT prophylaxis: SCDs and Lovenox Code Status: Full Family Communication: Daughter / Husband bedside Disposition Plan: Home   Discharge Diagnoses:  Principal Problem:   Hyponatremia  Discharge Instructions: Discharge Instructions    Call MD for:  extreme fatigue   Complete  by:  As directed    Call MD for:  persistant dizziness or light-headedness   Complete by:  As directed    Call MD for:  persistant nausea and vomiting   Complete by:  As directed    Increase activity slowly   Complete by:  As directed      Allergies as of 05/18/2017   No Known Allergies     Medication List    STOP taking these medications   lisinopril-hydrochlorothiazide 20-25 MG tablet Commonly known as:  PRINZIDE,ZESTORETIC     TAKE these medications   amLODipine 5 MG tablet Commonly known as:  NORVASC Take 1 tablet (5 mg total) by mouth daily. Start taking on:  05/19/2017   Calcium-Vitamin D 600-200 MG-UNIT tablet Take 1 tablet by mouth daily.   levothyroxine 75 MCG tablet Commonly known as:  SYNTHROID, LEVOTHROID Take 75 mcg by mouth daily.   lisinopril 10 MG tablet Commonly known as:  PRINIVIL,ZESTRIL Take 1 tablet (10 mg total) by mouth daily.   Memantine HCl ER 21 MG Cp24 Take 1 capsule by mouth daily.   metFORMIN 500 MG tablet Commonly known as:  GLUCOPHAGE Take 1,000 mg by mouth 2 (two) times daily.   metoprolol succinate 25 MG 24 hr tablet Commonly known as:  TOPROL-XL Take 25 mg by mouth every morning.   mirtazapine 15 MG tablet Commonly known as:  REMERON Take 7.5 mg by mouth at bedtime.   pravastatin 20 MG tablet Commonly known as:  PRAVACHOL Take 20 mg by mouth at bedtime.   vitamin C 1000 MG tablet Take 1,000 mg by mouth daily.      Follow-up Information    Celene Squibb, MD. Schedule an appointment as soon as  possible for a visit in 1 week(s).   Specialty:  Internal Medicine Contact information: [place] [address] [phone]          No Known Allergies Allergies as of 05/18/2017   No Known Allergies     Medication List    STOP taking these medications   lisinopril-hydrochlorothiazide 20-25 MG tablet Commonly known as:  PRINZIDE,ZESTORETIC     TAKE these medications   amLODipine 5 MG  tablet Commonly known as:  NORVASC Take 1 tablet (5 mg total) by mouth daily. Start taking on:  05/19/2017   Calcium-Vitamin D 600-200 MG-UNIT tablet Take 1 tablet by mouth daily.   levothyroxine 75 MCG tablet Commonly known as:  SYNTHROID, LEVOTHROID Take 75 mcg by mouth daily.   lisinopril 10 MG tablet Commonly known as:  PRINIVIL,ZESTRIL Take 1 tablet (10 mg total) by mouth daily.   Memantine HCl ER 21 MG Cp24 Take 1 capsule by mouth daily.   metFORMIN 500 MG tablet Commonly known as:  GLUCOPHAGE Take 1,000 mg by mouth 2 (two) times daily.   metoprolol succinate 25 MG 24 hr tablet Commonly known as:  TOPROL-XL Take 25 mg by mouth every morning.   mirtazapine 15 MG tablet Commonly known as:  REMERON Take 7.5 mg by mouth at bedtime.   pravastatin 20 MG tablet Commonly known as:  PRAVACHOL Take 20 mg by mouth at bedtime.   vitamin C 1000 MG tablet Take 1,000 mg by mouth daily.       Procedures/Studies: Dg Chest 2 View  Result Date: 05/16/2017 CLINICAL DATA:  72 year old female with hyponatremia, weakness, shortness of breath. EXAM: CHEST  2 VIEW COMPARISON:  Chest CTA 07/15/2011 and earlier. FINDINGS: Upright AP and lateral views of the chest. Mediastinal contours remain normal. Calcified aortic atherosclerosis. Visualized tracheal air column is within normal limits. Stable lung volumes and mild diffuse increased pulmonary interstitial markings. No pneumothorax, pulmonary edema, pleural effusion or acute pulmonary opacity. No acute osseous abnormality identified. Negative visible bowel gas pattern. IMPRESSION: No acute cardiopulmonary abnormality. Electronically Signed   By: Genevie Ann M.D.   On: 05/16/2017 23:13   Ct Head Wo Contrast  Result Date: 05/16/2017 CLINICAL DATA:  Generalized weakness and altered mental status for a few weeks. History of dementia, hypertension, diabetes. EXAM: CT HEAD WITHOUT CONTRAST TECHNIQUE: Contiguous axial images were obtained from the  base of the skull through the vertex without intravenous contrast. COMPARISON:  None. FINDINGS: BRAIN: No intraparenchymal hemorrhage, mass effect nor midline shift. The ventricles and sulci are normal for age. Confluent supratentorial white matter hypodensities. Small area LEFT mesial occipital lobe encephalomalacia. No acute large vascular territory infarcts. No abnormal extra-axial fluid collections. Basal cisterns are patent. Empty sella. VASCULAR: Moderate calcific atherosclerosis of the carotid siphons. SKULL: No skull fracture. No significant scalp soft tissue swelling. SINUSES/ORBITS: The mastoid air-cells and included paranasal sinuses are well-aerated.The included ocular globes and orbital contents are non-suspicious. OTHER: None. IMPRESSION: 1. No acute intracranial process. 2. 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 3. Moderate to severe chronic small vessel ischemic disease. Electronically Signed   By: Elon Alas M.D.   On: 05/16/2017 22:53   Mr Brain Wo Contrast  Result Date: 05/17/2017 CLINICAL DATA:  Altered mental status.  Dementia. EXAM: MRI HEAD WITHOUT CONTRAST TECHNIQUE: Multiplanar, multiecho pulse sequences of the brain and surrounding structures were obtained without intravenous contrast. COMPARISON:  CT brain 05/16/2017. FINDINGS: Brain: No evidence for acute infarction, hemorrhage, mass lesion, hydrocephalus,  or extra-axial fluid. Generalized atrophy. Advanced T2 and FLAIR hyperintensities throughout the white matter, consistent with small vessel disease. Chronic LEFT occipital infarct. Tiny chronic LEFT cerebellar infarcts. Vascular: Normal flow voids. Skull and upper cervical spine: Normal marrow signal. Partial empty sella. Sinuses/Orbits: Negative. Other: None. IMPRESSION: Atrophy and small vessel disease.  No acute intracranial findings. Electronically Signed   By: Staci Righter M.D.   On: 05/17/2017 14:32     Subjective: Pt says she feels much better, she  has been ambulating in room and feels much better.  No more confusion.   Discharge Exam: Vitals:   05/17/17 2159 05/18/17 0554  BP: (!) 120/50 (!) 154/73  Pulse: 76 79  Resp: 18 18  Temp: 98.2 F (36.8 C) 97.9 F (36.6 C)  SpO2: 92% 92%   Vitals:   05/17/17 1515 05/17/17 1617 05/17/17 2159 05/18/17 0554  BP: (!) 137/52 (!) 131/59 (!) 120/50 (!) 154/73  Pulse: 71 65 76 79  Resp: 16 18 18 18   Temp: 98 F (36.7 C) 98.5 F (36.9 C) 98.2 F (36.8 C) 97.9 F (36.6 C)  TempSrc: Oral Oral Oral Oral  SpO2: 92% 94% 92% 92%  Weight:    73.2 kg (161 lb 6.4 oz)  Height:       General: Pt is alert, awake, not in acute distress Cardiovascular: RRR, S1/S2 +, no rubs, no gallops Respiratory: CTA bilaterally, no wheezing, no rhonchi Abdominal: Soft, NT, ND, bowel sounds + Extremities: no edema, no cyanosis   The results of significant diagnostics from this hospitalization (including imaging, microbiology, ancillary and laboratory) are listed below for reference.     Microbiology: No results found for this or any previous visit (from the past 240 hour(s)).   Labs: BNP (last 3 results) No results for input(s): BNP in the last 8760 hours. Basic Metabolic Panel: Recent Labs  Lab 05/16/17 1747 05/17/17 0414 05/18/17 0620  NA 122* 130* 138  K 3.7 3.5 4.1  CL 82* 92* 101  CO2 26 25 28   GLUCOSE 97 122* 126*  BUN 16 16 13   CREATININE 0.82 0.75 0.67  CALCIUM 8.9 8.9 9.0   Liver Function Tests: Recent Labs  Lab 05/16/17 2150 05/17/17 0414  AST 18 16  ALT 14 12*  ALKPHOS 71 68  BILITOT 0.4 0.4  PROT 7.1 6.4*  ALBUMIN 4.4 3.8   No results for input(s): LIPASE, AMYLASE in the last 168 hours. No results for input(s): AMMONIA in the last 168 hours. CBC: Recent Labs  Lab 05/16/17 1747 05/17/17 0414 05/18/17 0620  WBC 9.9 7.8 5.8  HGB 14.4 14.1 13.6  HCT 44.6 43.4 43.0  MCV 93.5 93.3 94.7  PLT 282 257 232   Cardiac Enzymes: Recent Labs  Lab 05/16/17 2150  TROPONINI  <0.03   BNP: Invalid input(s): POCBNP CBG: No results for input(s): GLUCAP in the last 168 hours. D-Dimer No results for input(s): DDIMER in the last 72 hours. Hgb A1c No results for input(s): HGBA1C in the last 72 hours. Lipid Profile No results for input(s): CHOL, HDL, LDLCALC, TRIG, CHOLHDL, LDLDIRECT in the last 72 hours. Thyroid function studies Recent Labs    05/16/17 1746  TSH 2.224   Anemia work up No results for input(s): VITAMINB12, FOLATE, FERRITIN, TIBC, IRON, RETICCTPCT in the last 72 hours. Urinalysis    Component Value Date/Time   COLORURINE YELLOW 05/16/2017 1718   APPEARANCEUR CLEAR 05/16/2017 1718   LABSPEC 1.008 05/16/2017 1718   PHURINE 6.0 05/16/2017 1718  GLUCOSEU NEGATIVE 05/16/2017 1718   HGBUR NEGATIVE 05/16/2017 1718   BILIRUBINUR NEGATIVE 05/16/2017 1718   KETONESUR NEGATIVE 05/16/2017 1718   PROTEINUR NEGATIVE 05/16/2017 1718   NITRITE NEGATIVE 05/16/2017 1718   LEUKOCYTESUR NEGATIVE 05/16/2017 1718   Sepsis Labs Invalid input(s): PROCALCITONIN,  WBC,  LACTICIDVEN Microbiology No results found for this or any previous visit (from the past 240 hour(s)).  Time coordinating discharge: 33 minutes  SIGNED:  Irwin Brakeman, MD  [facility] 05/18/2017, 9:17 AM Pager [phone]  If 7PM-7AM, please contact night-coverage [email or web address] Password [email or web address]

## 2017-05-18 NOTE — Discharge Instructions (Signed)
Follow with Primary MD  Celene Squibb, MD  and other consultant's as instructed your Hospitalist MD  Please get a complete blood count and chemistry panel checked by your Primary MD at your next visit, and again as instructed by your Primary MD.  Get Medicines reviewed and adjusted: Please take all your medications with you for your next visit with your Primary MD  Laboratory/radiological data: Please request your Primary MD to go over all hospital tests and procedure/radiological results at the follow up, please ask your Primary MD to get all Hospital records sent to his/her office.  In some cases, they will be blood work, cultures and biopsy results pending at the time of your discharge. Please request that your primary care M.D. follows up on these results.  Also Note the following: If you experience worsening of your admission symptoms, develop shortness of breath, life threatening emergency, suicidal or homicidal thoughts you must seek medical attention immediately by calling 911 or calling your MD immediately  if symptoms less severe.  You must read complete instructions/literature along with all the possible adverse reactions/side effects for all the Medicines you take and that have been prescribed to you. Take any new Medicines after you have completely understood and accpet all the possible adverse reactions/side effects.   Do not drive when taking Pain medications or sleeping medications (Benzodaizepines)  Do not take more than prescribed Pain, Sleep and Anxiety Medications. It is not advisable to combine anxiety,sleep and pain medications without talking with your primary care practitioner  Special Instructions: If you have smoked or chewed Tobacco  in the last 2 yrs please stop smoking, stop any regular Alcohol  and or any Recreational drug use.  Wear Seat belts while driving.  Please note: You were cared for by a hospitalist during your hospital stay. Once you are discharged,  your primary care physician will handle any further medical issues. Please note that NO REFILLS for any discharge medications will be authorized once you are discharged, as it is imperative that you return to your primary care physician (or establish a relationship with a primary care physician if you do not have one) for your post hospital discharge needs so that they can reassess your need for medications and monitor your lab values.       Hyponatremia Hyponatremia is when the amount of salt (sodium) in your blood is too low. When salt levels are low, your cells absorb extra water and they swell. The swelling happens throughout the body, but it mostly affects the brain. Follow these instructions at home:  Take medicines only as told by your doctor. Many medicines can make this condition worse. Talk with your doctor about any medicines that you are currently taking.  Carefully follow a recommended diet as told by your doctor.  Carefully follow instructions from your doctor about fluid restrictions.  Keep all follow-up visits as told by your doctor. This is important.  Do not drink alcohol. Contact a doctor if:  You feel sicker to your stomach (nauseous).  You feel more confused.  You feel more tired (fatigued).  Your headache gets worse.  You feel weaker.  Your symptoms go away and then they come back.  You have trouble following the diet instructions. Get help right away if:  You start to twitch and shake (have a seizure).  You pass out (faint).  You keep having watery poop (diarrhea).  You keep throwing up (vomiting). This information is not intended to replace advice given  to you by your health care provider. Make sure you discuss any questions you have with your health care provider. Document Released: 11/30/2010 Document Revised: 08/26/2015 Document Reviewed: 03/16/2014 Elsevier Interactive Patient Education  2018 [place].  Sodium (Na) Test Sodium (Na) is  an important mineral (electrolyte) in your body. You need a normal amount of sodium to balance the amount of fluid you have in your body. If your sodium level is too high (hypernatremia), you can retain too much fluid. If your sodium level is too low (hyponatremia), you may lose too much fluid. Sodium is also important for proper nerve and muscle function. You get sodium from the salt in your diet. Chemical messengers (hormones) made in glands located above your kidneys (adrenal glands) control how much sodium your body keeps or gets rid of in your urine. This test requires a blood sample taken from a vein in your hand or arm. You may have this test along with tests for other electrolytes as part of routine blood work. You may also have a sodium test if:  You have signs of hypernatremia. These include: ? Being thirsty. ? Having reduced urination. ? Having muscle twitching.  You have signs of hyponatremia, such as weakness or confusion.  You are given fluids through a vein (intravenously).  You take water pills (diuretics).  Your health care provider needs to monitor a condition that can cause your fluids to be unbalanced, such as: ? High blood pressure. ? Heart disease. ? Kidney disease. ? Liver disease.  How do I prepare for this test? Many medicines can interfere with your sodium level. Tell your health care provider about all the medicines you take. You may need to stop taking some medicines before having this test. These can include:  Certain antibiotics.  Certain high blood pressure drugs.  Diuretics.  Birth control pills.  What do the results mean? It is your responsibility to obtain your test results. Ask the lab or department performing the test when and how you will get your results. Contact your health care provider to discuss any questions you have about your results. Results of a sodium test will be a range of values. Sodium is measured in milliequivalents per liter  (mEq/L). Range of Normal Values Ranges for normal values may vary among different labs and hospitals. You should always check with your health care provider after having lab work or other tests done to discuss whether your values are considered within normal limits. The normal ranges for sodium are:  Adult/elderly: 136-145 mEq/L or 136/145 mmol/L (SI units).  Child: 136-145 mEq/L.  Infant: 134-150 mEq/L.  Newborn: 134-144 mEq/L.  Meaning of Results Outside Normal Value Ranges Many different factors can cause a high or low sodium level. Common causes of hypernatremia include:  Making (secreting) too many hormones.  Not drinking enough fluids (dehydration).  Having too much salt in your diet.  Common causes of hyponatremia include:  Undersecreting certain hormones.  Drinking too much water.  Losing sodium through sweating, vomiting, or diarrhea.  Taking a diuretic.  Having kidney disease.  Discuss the meaning of your results with your health care provider. You may need to have additional tests to measure your sodium level. This information is not intended to replace advice given to you by your health care provider. Make sure you discuss any questions you have with your health care provider. Document Released: 04/22/2004 Document Revised: 08/26/2015 Document Reviewed: 07/10/2013 Elsevier Interactive Patient Education  2018 [employer].   Weakness  Weakness is a lack of strength. You may feel weak all over your body (generalized), or you may feel weak in one specific part of your body (focal). Common causes of weakness include:  Infection and immune system disorders.  Physical exhaustion.  Internal bleeding or other blood loss that results in a lack of red blood cells (anemia).  Dehydration.  An imbalance in mineral (electrolyte) levels, such as potassium.  Heart disease, circulation problems, or stroke.  Other causes include:  Some medicines or cancer  treatment.  Stress, anxiety, or depression.  Nervous system disorders.  Thyroid disorders.  Loss of muscle strength because of age or inactivity.  Poor sleep quality or sleep disorders.  The cause of your weakness may not be known. Some causes of weakness can be serious, so it is important to see your health care provider. Follow these instructions at home:  Rest as needed.  Try to get enough sleep. Talk to your health care provider about how much sleep you need each night.  Take over-the-counter and prescription medicines only as told by your health care provider.  Eat a healthy, well-balanced diet. This includes: ? Proteins to build muscles, such as lean meats and fish. ? Fresh fruits and vegetables. ? Carbohydrates to boost energy, such as whole grains.  Drink enough fluid to keep your urine clear or pale yellow.  Do strength exercises, such as arm curls and leg raises, for 30 minutes at least 2 days a week or as told by your health care provider.  Consider working with a physical therapist or trainer who can develop an exercise plan to help you gain muscle strength.  Keep all follow-up visits as told by your health care provider. This is important. Contact a health care provider if:  Your weakness does not improve or gets worse.  Your weakness affects your ability to think clearly.  Your weakness affects your ability to do your normal daily activities. Get help right away if:  You develop sudden weakness.  You have trouble breathing or shortness of breath.  You have problems with your vision.  You have trouble talking or swallowing.  You have trouble standing or walking.  You have chest pain.  You are light-headed or lose consciousness. This information is not intended to replace advice given to you by your health care provider. Make sure you discuss any questions you have with your health care provider. Document Released: 03/20/2005 Document Revised:  04/15/2015 Document Reviewed: 01/08/2015 Elsevier Interactive Patient Education  [employer].

## 2017-05-18 NOTE — Care Management Important Message (Signed)
Important Message  Patient Details  Name: Paula Andrews MRN: [id number] Date of Birth: [birth year]   Medicare Important Message Given:  N/A - LOS <3 / Initial given by admissions    Acel Natzke, Chauncey Reading, RN 05/18/2017, 10:19 AM

## 2017-05-18 NOTE — Progress Notes (Signed)
Removed IV-clean, dry, intact. Reviewed d/c paperwork with pt and family. Reviewed new and home meds. Answered all questions. Wheeled stable pt and belongings to main entrance where she got in her daughter's car.

## 2017-05-22 ENCOUNTER — Other Ambulatory Visit: Payer: Self-pay | Admitting: *Deleted

## 2017-05-22 DIAGNOSIS — E871 Hypo-osmolality and hyponatremia: Secondary | ICD-10-CM | POA: Diagnosis not present

## 2017-05-22 DIAGNOSIS — F0390 Unspecified dementia without behavioral disturbance: Secondary | ICD-10-CM | POA: Diagnosis not present

## 2017-05-22 DIAGNOSIS — R0681 Apnea, not elsewhere classified: Secondary | ICD-10-CM | POA: Diagnosis not present

## 2017-05-22 DIAGNOSIS — Z6827 Body mass index (BMI) 27.0-27.9, adult: Secondary | ICD-10-CM | POA: Diagnosis not present

## 2017-05-22 DIAGNOSIS — F0391 Unspecified dementia with behavioral disturbance: Secondary | ICD-10-CM | POA: Diagnosis not present

## 2017-05-22 DIAGNOSIS — Z712 Person consulting for explanation of examination or test findings: Secondary | ICD-10-CM | POA: Diagnosis not present

## 2017-05-22 DIAGNOSIS — N644 Mastodynia: Secondary | ICD-10-CM | POA: Diagnosis not present

## 2017-05-22 DIAGNOSIS — I10 Essential (primary) hypertension: Secondary | ICD-10-CM | POA: Diagnosis not present

## 2017-05-22 DIAGNOSIS — G3184 Mild cognitive impairment, so stated: Secondary | ICD-10-CM | POA: Diagnosis not present

## 2017-05-22 DIAGNOSIS — R5383 Other fatigue: Secondary | ICD-10-CM | POA: Diagnosis not present

## 2017-05-22 NOTE — Patient Outreach (Signed)
[place] [facility]) Care Management  05/22/2017  QIANA LANDGREBE 1946-01-03 [id number]  EMMI-General Discharge RED ON EMMI ALERT DAY#: 05/21/17 DATE 1: RED ALERT: Read discharge paperwork? No Know who to call about changes in condition? No Scheduled follow-up? No  Outreach attempt #1 to patient. No answer. RN CM left HIPAA compliant message along with contact info.    Plan: RN CM will contact patient within one week.    [name], RN, BSN, MHA/MSL, [place] Telephonic Care Manager Coordinator [facility] Direct Phone: [phone] Toll Free: [phone] Fax: [phone]

## 2017-05-23 ENCOUNTER — Encounter: Payer: Self-pay | Admitting: *Deleted

## 2017-05-23 NOTE — Patient Outreach (Signed)
[place] [facility]) Care Management  05/22/17  Paula Andrews 07-10-1945 [id number]  EMMI-General Discharge RED ON EMMI ALERT DAY#: 05/21/17 DATE 1: RED ALERT: Read discharge paperwork? No Know who to call about changes in condition? No Scheduled follow-up? No  Incoming telephone call from patient. HIPAA verified with patient. Patient reported, her husband received, read, and understood her hospital discharge instructions. Patient stated, she was in the hospital due to having problems with her memory. Patient stated her sodium level was low, which caused her confusion. Patient stated, her memory is better now. She stated, she is not sure to call about changes in her condition. Her husband knows who to contact if she has changes in her condition. Patient gave RN CM permission to speak with her speak with her spouse. Patient spouse Paula Andrews) stated, patient had a follow-up hospital discharge visit with her primary MD today. Per Paula Andrews, the MD visit was good. Per Paula Andrews, the MD discontinued patient's Namenda and started her on Namazaric. Paula Andrews reported, patient is taking her medications as prescribed  and she can afford her medications. Per spouse, patient is not active with any home health services. He stated, she doesn't need any home health services. Paula Andrews stated, patient DM is controlled and she takes Metformin for her DM.  Patient and spouse had no further questions or concerns. [facility] services and benefits explained to patient.   Plan: RN CM will send patient EMMI educational materials.  RM CM will send successful outreach letter to patient with introductory. RN CM will notify [facility] Case Management Assistant regarding case closure. RN CM advised patient to contact RNCM for any needs or concerns.  [name], RN, BSN, MHA/MSL, [place] Telephonic Care Manager Coordinator [facility] Direct Phone: [phone] Cell Phone: [phone] Toll Free: [phone] Fax:  [phone]

## 2017-05-25 ENCOUNTER — Other Ambulatory Visit (HOSPITAL_BASED_OUTPATIENT_CLINIC_OR_DEPARTMENT_OTHER): Payer: Self-pay

## 2017-05-25 ENCOUNTER — Other Ambulatory Visit (HOSPITAL_COMMUNITY): Payer: Self-pay | Admitting: Internal Medicine

## 2017-05-25 ENCOUNTER — Other Ambulatory Visit: Payer: Self-pay | Admitting: *Deleted

## 2017-05-25 DIAGNOSIS — Z1231 Encounter for screening mammogram for malignant neoplasm of breast: Secondary | ICD-10-CM

## 2017-05-25 DIAGNOSIS — G4730 Sleep apnea, unspecified: Secondary | ICD-10-CM

## 2017-05-25 DIAGNOSIS — R5383 Other fatigue: Secondary | ICD-10-CM

## 2017-05-25 NOTE — Patient Outreach (Signed)
[place] [facility]) Care Management  05/25/2017  Paula Andrews 09-04-1945 [id number]   EMMI- General Discharge RED ON EMMI ALERT DAY#: 4 DATE: 05/24/17 RED ALERT: Lost interest in things? Yes   Outreach attempt #1 to patient. No answer. RN CM left HIPAA compliant message along with contact info.    Plan: RN CM will contact patient within one week.    [name], RN, BSN, MHA/MSL, [place] Telephonic Care Manager Coordinator [facility] Direct Phone: [phone] Toll Free: [phone] Fax: [phone]

## 2017-05-26 DIAGNOSIS — G3184 Mild cognitive impairment, so stated: Secondary | ICD-10-CM | POA: Diagnosis not present

## 2017-05-26 DIAGNOSIS — I10 Essential (primary) hypertension: Secondary | ICD-10-CM | POA: Diagnosis not present

## 2017-05-26 DIAGNOSIS — N390 Urinary tract infection, site not specified: Secondary | ICD-10-CM | POA: Diagnosis not present

## 2017-05-26 DIAGNOSIS — R1111 Vomiting without nausea: Secondary | ICD-10-CM | POA: Diagnosis not present

## 2017-05-26 DIAGNOSIS — F0390 Unspecified dementia without behavioral disturbance: Secondary | ICD-10-CM | POA: Diagnosis not present

## 2017-05-28 ENCOUNTER — Ambulatory Visit: Payer: Self-pay | Admitting: *Deleted

## 2017-05-28 ENCOUNTER — Other Ambulatory Visit: Payer: Self-pay | Admitting: *Deleted

## 2017-05-28 NOTE — Patient Outreach (Signed)
[facility]) Care Management  05/28/2017  Paula Andrews 1945/06/11 [id number]   EMMI- General Discharge RED ON EMMI ALERT DAY#: 4 DATE: 05/24/17 RED ALERT: Lost interest in things? Yes  Outgoing telephone call to patient. HIPAA verified with patient's spouse, Paula Andrews. Patient gave permission to speak with Paula Andrews during previous telephone call. Spoke with caregiver. Reviewed and addressed red alert. Patient was not available. Her daughter took her to [employer], per [place]. Per Paula Andrews, he is having a difficult time assisting patient with her medical care due to her memory loss. Paula Andrews reported, he called the MD's office and attempted to speak with someone, but he had to leave a message. He stated, patient doesn't believe he is her husband any longer. She believes he is a caregiver. Paula Andrews reported, he suggested to the patient this morning about taking her medications, bathing, and washing clothes. Per Paula Andrews, patient became very upset with his suggestions and made several unpleasant remarks. One of the remarks was that she had been cleaning this house everyday, per spouse. Spouse stated, patient hasn't touch the house within the last 7 to 8 months.   Paula Andrews reported, "their son called and asked why does remember the rest of the family and doesn't remember you"? Paula Andrews stated, "I don't know why she doesn't remember me. I promise that I have always been good to this lady". RN CM and Paula Andrews discussed short term and long term memory  Paula Andrews discussed how patient has complained about his oxygen machine making to much noise. He stated, patient's parents had to be placed in a facility related to memory loss. Paula Andrews verbalized that patient said, she wouldn't do well in a facility. Paula Andrews stated, he doesn't want to have to go that route with the patient and he is seeking assistance. [facility] services and benefits explained to [place] and he agreed to services.   Plan: RN CM advised patient to contact RNCM  for any needs or concerns. RN CM advised patient to alert MD for any changes in conditions.  RN CM will send [facility] SW referral for possible assistance with possible depression, memory, and psychological evaluation.  [name], RN, BSN, MHA/MSL, [place] Telephonic Care Manager Coordinator [facility] Direct Phone: [phone] Cell Phone: [phone] Toll Free: [phone] Fax: [phone]

## 2017-05-29 ENCOUNTER — Emergency Department (HOSPITAL_COMMUNITY): Payer: Medicare Other

## 2017-05-29 ENCOUNTER — Encounter (HOSPITAL_COMMUNITY): Payer: Self-pay | Admitting: Emergency Medicine

## 2017-05-29 ENCOUNTER — Inpatient Hospital Stay (HOSPITAL_COMMUNITY)
Admission: EM | Admit: 2017-05-29 | Discharge: 2017-06-01 | DRG: 189 | Disposition: A | Discharge status: Home Health | Payer: Medicare Other | Attending: Internal Medicine | Admitting: Internal Medicine

## 2017-05-29 DIAGNOSIS — G4733 Obstructive sleep apnea (adult) (pediatric): Secondary | ICD-10-CM | POA: Diagnosis present

## 2017-05-29 DIAGNOSIS — Z79890 Hormone replacement therapy (postmenopausal): Secondary | ICD-10-CM

## 2017-05-29 DIAGNOSIS — E871 Hypo-osmolality and hyponatremia: Secondary | ICD-10-CM | POA: Diagnosis present

## 2017-05-29 DIAGNOSIS — R0902 Hypoxemia: Secondary | ICD-10-CM | POA: Diagnosis not present

## 2017-05-29 DIAGNOSIS — G3184 Mild cognitive impairment, so stated: Secondary | ICD-10-CM | POA: Diagnosis not present

## 2017-05-29 DIAGNOSIS — G9349 Other encephalopathy: Secondary | ICD-10-CM | POA: Diagnosis not present

## 2017-05-29 DIAGNOSIS — F4325 Adjustment 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: Secondary | ICD-10-CM

## 2017-05-29 DIAGNOSIS — R0602 Shortness of breath: Secondary | ICD-10-CM

## 2017-05-29 DIAGNOSIS — J9611 Chronic respiratory failure with hypoxia: Secondary | ICD-10-CM

## 2017-05-29 DIAGNOSIS — R4182 Altered mental status, unspecified: Secondary | ICD-10-CM | POA: Diagnosis not present

## 2017-05-29 DIAGNOSIS — E039 Hypothyroidism, unspecified: Secondary | ICD-10-CM | POA: Diagnosis present

## 2017-05-29 DIAGNOSIS — R1111 Vomiting without nausea: Secondary | ICD-10-CM | POA: Diagnosis not present

## 2017-05-29 DIAGNOSIS — F0390 Unspecified dementia without behavioral disturbance: Secondary | ICD-10-CM | POA: Diagnosis not present

## 2017-05-29 DIAGNOSIS — Z8673 Personal history of transient ischemic attack (TIA), and cerebral infarction without residual deficits: Secondary | ICD-10-CM

## 2017-05-29 DIAGNOSIS — R0681 Apnea, not elsewhere classified: Secondary | ICD-10-CM | POA: Diagnosis not present

## 2017-05-29 DIAGNOSIS — J9621 Acute and chronic respiratory failure with hypoxia: Secondary | ICD-10-CM | POA: Diagnosis present

## 2017-05-29 DIAGNOSIS — N644 Mastodynia: Secondary | ICD-10-CM | POA: Diagnosis not present

## 2017-05-29 DIAGNOSIS — E119 Type 2 diabetes mellitus without complications: Secondary | ICD-10-CM

## 2017-05-29 DIAGNOSIS — I10 Essential (primary) hypertension: Secondary | ICD-10-CM | POA: Diagnosis present

## 2017-05-29 DIAGNOSIS — R410 Disorientation, unspecified: Secondary | ICD-10-CM

## 2017-05-29 DIAGNOSIS — Z712 Person consulting for explanation of examination or test findings: Secondary | ICD-10-CM | POA: Diagnosis not present

## 2017-05-29 DIAGNOSIS — J9602 Acute respiratory failure with hypercapnia: Secondary | ICD-10-CM | POA: Diagnosis not present

## 2017-05-29 DIAGNOSIS — J439 Emphysema, unspecified: Secondary | ICD-10-CM

## 2017-05-29 DIAGNOSIS — Z87891 Personal history of nicotine dependence: Secondary | ICD-10-CM

## 2017-05-29 DIAGNOSIS — J449 Chronic obstructive pulmonary disease, unspecified: Secondary | ICD-10-CM | POA: Diagnosis present

## 2017-05-29 DIAGNOSIS — N390 Urinary tract infection, site not specified: Secondary | ICD-10-CM | POA: Diagnosis not present

## 2017-05-29 DIAGNOSIS — Z7984 Long term (current) use of oral hypoglycemic drugs: Secondary | ICD-10-CM

## 2017-05-29 DIAGNOSIS — E1151 Type 2 diabetes mellitus with diabetic peripheral angiopathy without gangrene: Secondary | ICD-10-CM | POA: Diagnosis present

## 2017-05-29 DIAGNOSIS — Z833 Family history of diabetes mellitus: Secondary | ICD-10-CM

## 2017-05-29 DIAGNOSIS — J9601 Acute respiratory failure with hypoxia: Secondary | ICD-10-CM | POA: Diagnosis present

## 2017-05-29 DIAGNOSIS — J9622 Acute and chronic respiratory failure with hypercapnia: Secondary | ICD-10-CM | POA: Diagnosis not present

## 2017-05-29 DIAGNOSIS — I119 Hypertensive heart disease without heart failure: Secondary | ICD-10-CM | POA: Diagnosis present

## 2017-05-29 DIAGNOSIS — E785 Hyperlipidemia, unspecified: Secondary | ICD-10-CM | POA: Diagnosis present

## 2017-05-29 DIAGNOSIS — Z6827 Body mass index (BMI) 27.0-27.9, adult: Secondary | ICD-10-CM | POA: Diagnosis not present

## 2017-05-29 DIAGNOSIS — J9612 Chronic respiratory failure with hypercapnia: Secondary | ICD-10-CM

## 2017-05-29 DIAGNOSIS — Z90710 Acquired absence of both cervix and uterus: Secondary | ICD-10-CM

## 2017-05-29 DIAGNOSIS — Z79899 Other long term (current) drug therapy: Secondary | ICD-10-CM

## 2017-05-29 DIAGNOSIS — F0391 Unspecified dementia with behavioral disturbance: Secondary | ICD-10-CM | POA: Diagnosis not present

## 2017-05-29 DIAGNOSIS — R5383 Other fatigue: Secondary | ICD-10-CM | POA: Diagnosis not present

## 2017-05-29 DIAGNOSIS — E118 Type 2 diabetes mellitus with unspecified complications: Secondary | ICD-10-CM | POA: Diagnosis not present

## 2017-05-29 LAB — CBC
HEMATOCRIT: 43.4 % (ref 36.0–46.0)
Hemoglobin: 13.9 g/dL (ref 12.0–15.0)
MCH: 30.7 pg (ref 26.0–34.0)
MCHC: 32 g/dL (ref 30.0–36.0)
MCV: 95.8 fL (ref 78.0–100.0)
Platelets: 300 10*3/uL (ref 150–400)
RBC: 4.53 MIL/uL (ref 3.87–5.11)
RDW: 13 % (ref 11.5–15.5)
WBC: 8.1 10*3/uL (ref 4.0–10.5)

## 2017-05-29 LAB — URINALYSIS, ROUTINE W REFLEX MICROSCOPIC
BILIRUBIN URINE: NEGATIVE
Glucose, UA: NEGATIVE mg/dL
Hgb urine dipstick: NEGATIVE
KETONES UR: 5 mg/dL — AB
Nitrite: NEGATIVE
PH: 6 (ref 5.0–8.0)
PROTEIN: NEGATIVE mg/dL
Specific Gravity, Urine: 1.014 (ref 1.005–1.030)

## 2017-05-29 LAB — RAPID URINE DRUG SCREEN, HOSP PERFORMED
Amphetamines: NOT DETECTED
BARBITURATES: NOT DETECTED
BENZODIAZEPINES: NOT DETECTED
COCAINE: NOT DETECTED
OPIATES: NOT DETECTED
Tetrahydrocannabinol: NOT DETECTED

## 2017-05-29 LAB — COMPREHENSIVE METABOLIC PANEL
ALBUMIN: 4.6 g/dL (ref 3.5–5.0)
ALT: 16 U/L (ref 14–54)
ANION GAP: 11 (ref 5–15)
AST: 21 U/L (ref 15–41)
Alkaline Phosphatase: 60 U/L (ref 38–126)
BUN: 10 mg/dL (ref 6–20)
CO2: 30 mmol/L (ref 22–32)
Calcium: 9.5 mg/dL (ref 8.9–10.3)
Chloride: 97 mmol/L — ABNORMAL LOW (ref 101–111)
Creatinine, Ser: 0.7 mg/dL (ref 0.44–1.00)
GFR calc Af Amer: >60 mL/min (ref >60)
GFR calc non Af Amer: >60 mL/min (ref >60)
GLUCOSE: 92 mg/dL (ref 65–99)
Potassium: 4 mmol/L (ref 3.5–5.1)
SODIUM: 138 mmol/L (ref 135–145)
TOTAL PROTEIN: 7.2 g/dL (ref 6.5–8.1)
Total Bilirubin: 0.7 mg/dL (ref 0.3–1.2)

## 2017-05-29 LAB — CBG MONITORING, ED
GLUCOSE-CAPILLARY: 128 mg/dL — AB (ref 65–99)
Glucose-Capillary: 89 mg/dL (ref 65–99)

## 2017-05-29 LAB — ACETAMINOPHEN LEVEL

## 2017-05-29 LAB — ETHANOL: Alcohol, Ethyl (B): <10 mg/dL (ref <10)

## 2017-05-29 LAB — SALICYLATE LEVEL

## 2017-05-29 MED ORDER — ONDANSETRON HCL 4 MG PO TABS: 4.0000 mg | ORAL_TABLET | Freq: Four times a day (QID) | ORAL | Status: DC | PRN

## 2017-05-29 MED ORDER — INSULIN ASPART 100 UNIT/ML ~~LOC~~ SOLN
0.0000 [IU] | Freq: Three times a day (TID) | SUBCUTANEOUS | Status: DC
  Administered 2017-05-30: 1 [IU] via SUBCUTANEOUS

## 2017-05-29 MED ORDER — MEMANTINE HCL ER 7 MG PO CP24
21.0000 mg | ORAL_CAPSULE | Freq: Every day | ORAL | Status: DC
  Administered 2017-05-30 – 2017-06-01 (×3): 21 mg via ORAL
  Filled 2017-05-29 (×3): qty 3

## 2017-05-29 MED ORDER — METOPROLOL SUCCINATE ER 25 MG PO TB24
25.0000 mg | ORAL_TABLET | Freq: Every morning | ORAL | Status: DC
  Administered 2017-05-30 – 2017-06-01 (×3): 25 mg via ORAL
  Filled 2017-05-29 (×3): qty 1

## 2017-05-29 MED ORDER — SODIUM CHLORIDE 0.9 % IJ SOLN
INTRAMUSCULAR | Status: AC
  Filled 2017-05-29: qty 50

## 2017-05-29 MED ORDER — ACETAMINOPHEN 650 MG RE SUPP: 650.0000 mg | Freq: Four times a day (QID) | RECTAL | Status: DC | PRN

## 2017-05-29 MED ORDER — PRAVASTATIN SODIUM 20 MG PO TABS
20.0000 mg | ORAL_TABLET | Freq: Every day | ORAL | Status: DC
  Administered 2017-05-30 – 2017-05-31 (×2): 20 mg via ORAL
  Filled 2017-05-29 (×3): qty 1

## 2017-05-29 MED ORDER — IOPAMIDOL (ISOVUE-370) INJECTION 76%
INTRAVENOUS | Status: AC
  Administered 2017-05-29: 100 mL
  Filled 2017-05-29: qty 100

## 2017-05-29 MED ORDER — IPRATROPIUM-ALBUTEROL 0.5-2.5 (3) MG/3ML IN SOLN: 3.0000 mL | Freq: Four times a day (QID) | RESPIRATORY_TRACT | Status: DC | PRN

## 2017-05-29 MED ORDER — LISINOPRIL 5 MG PO TABS
5.0000 mg | ORAL_TABLET | Freq: Every day | ORAL | Status: DC
  Administered 2017-05-30 – 2017-06-01 (×3): 5 mg via ORAL
  Filled 2017-05-29 (×3): qty 1

## 2017-05-29 MED ORDER — LEVOTHYROXINE SODIUM 75 MCG PO TABS
75.0000 ug | ORAL_TABLET | Freq: Every day | ORAL | Status: DC
  Administered 2017-05-30 – 2017-06-01 (×3): 75 ug via ORAL
  Filled 2017-05-29 (×3): qty 1

## 2017-05-29 MED ORDER — ONDANSETRON HCL 4 MG/2ML IJ SOLN: 4.0000 mg | Freq: Four times a day (QID) | INTRAMUSCULAR | Status: DC | PRN

## 2017-05-29 MED ORDER — ACETAMINOPHEN 325 MG PO TABS: 650.0000 mg | ORAL_TABLET | Freq: Four times a day (QID) | ORAL | Status: DC | PRN

## 2017-05-29 MED ORDER — ENOXAPARIN SODIUM 40 MG/0.4ML ~~LOC~~ SOLN
40.0000 mg | Freq: Every day | SUBCUTANEOUS | Status: DC
  Administered 2017-05-30 – 2017-05-31 (×3): 40 mg via SUBCUTANEOUS
  Filled 2017-05-29 (×3): qty 0.4

## 2017-05-29 NOTE — ED Notes (Signed)
ED Provider at bedside.  Sofia pa

## 2017-05-29 NOTE — ED Provider Notes (Signed)
[place] DEPT Provider Note   CSN: [id number] Arrival date & time: 05/29/17  1641     History   Chief Complaint Chief Complaint  Patient presents with  . Altered Mental Status  . Low Oxygen    HPI Paula Andrews is a 72 y.o. female.  The history is provided by the patient. No language interpreter was used.  Altered Mental Status   This is a new problem. The current episode started more than 1 week ago. The problem has not changed since onset.Associated symptoms include confusion. Her past medical history is significant for hypertension. Her past medical history does not include seizures.  Pt has had recent increase in confusion.  Pt's daughters report pt does not know her husband.  Pt was hospitalized at [facility] recently had had some improvement while in the hospital.  Pt has been having increasing shortness of breath at home.  Pt has been using her husbands oxygen.    Past Medical History:  Diagnosis Date  . Dementia   . Diabetes mellitus   . Hypertension   . Thyroid disease     Patient Active Problem List   Diagnosis Date Noted  . Hyponatremia 05/16/2017  . Weakness   . Fracture of radial neck, left, closed 01/29/2012  . CLOSED FRACTURE UNSPEC PART UPPER END HUMERUS 04/13/2010    Past Surgical History:  Procedure Laterality Date  . ABDOMINAL HYSTERECTOMY      OB History    No data available       Home Medications    Prior to Admission medications   Medication Sig Start Date End Date Taking? Authorizing Provider  Ascorbic Acid (VITAMIN C) 1000 MG tablet Take 1,000 mg by mouth daily.   Yes [provider]  Calcium-Vitamin D 600-200 MG-UNIT per tablet Take 1 tablet by mouth daily.   Yes [provider]  levothyroxine (SYNTHROID, LEVOTHROID) 75 MCG tablet Take 75 mcg by mouth daily.    Yes [provider]  lisinopril (PRINIVIL,ZESTRIL) 5 MG tablet Take 5 mg by mouth daily.  05/26/17  Yes [provider]  Memantine HCl ER 21 MG CP24 Take 1 capsule by mouth daily.   Yes [provider]  metFORMIN (GLUCOPHAGE) 500 MG tablet Take 250 mg by mouth 2 (two) times daily with a meal.    Yes [provider]  metoprolol succinate (TOPROL-XL) 25 MG 24 hr tablet Take 25 mg by mouth every morning.   Yes [provider]  nitrofurantoin, macrocrystal-monohydrate, (MACROBID) 100 MG capsule Take 100 mg by mouth 2 (two) times daily.  05/26/17  Yes [provider]  ondansetron (ZOFRAN) 4 MG tablet Take 4 mg by mouth every 8 (eight) hours as needed for nausea or vomiting.  05/26/17  Yes [provider]  pravastatin (PRAVACHOL) 20 MG tablet Take 20 mg by mouth daily.    Yes [provider]  amLODipine (NORVASC) 5 MG tablet Take 1 tablet (5 mg total) by mouth daily. Patient not taking: Reported on 05/29/2017 05/19/17 06/18/17  Irwin Brakeman L, MD  lisinopril (PRINIVIL,ZESTRIL) 10 MG tablet Take 1 tablet (10 mg total) by mouth daily. Patient not taking: Reported on 05/29/2017 05/18/17 06/17/17  Murlean Iba, MD    Family History Family History  Problem Relation Age of Onset  . Diabetes Unknown     Social History Social History   Tobacco Use  . Smoking status: Never Smoker  . Smokeless tobacco: Never Used  Substance Use  Topics  . Alcohol use: No  . Drug use: No     Allergies   Patient has no known allergies.   Review of Systems Review of Systems  Psychiatric/Behavioral: Positive for confusion.  All other systems reviewed and are negative.    Physical Exam Updated Vital Signs BP (!) 158/68 (BP Location: Left Arm)   Pulse 60   Temp 98.4 F (36.9 C) (Oral)   Resp 20   SpO2 92%   Physical Exam  Constitutional: She appears well-developed and well-nourished. No distress.  HENT:  Head: Normocephalic and atraumatic.  Eyes: Conjunctivae are normal.  Neck: Neck supple.  Cardiovascular: Normal rate and regular rhythm.  No  murmur heard. Pulmonary/Chest: Effort normal and breath sounds normal. No respiratory distress.  Abdominal: Soft. There is no tenderness.  Musculoskeletal: She exhibits no edema.  Neurological: She is alert.  Skin: Skin is warm and dry.  Psychiatric:  Confused pleasant  Nursing note and vitals reviewed.    ED Treatments / Results  Labs (all labs ordered are listed, but only abnormal results are displayed) Labs Reviewed  COMPREHENSIVE METABOLIC PANEL - Abnormal; Notable for the following components:      Result Value   Chloride 97 (*)    All other components within normal limits  ACETAMINOPHEN LEVEL - Abnormal; Notable for the following components:   Acetaminophen (Tylenol), Serum <10 (*)    All other components within normal limits  URINALYSIS, ROUTINE W REFLEX MICROSCOPIC - Abnormal; Notable for the following components:   Ketones, ur 5 (*)    Leukocytes, UA SMALL (*)    Bacteria, UA RARE (*)    Squamous Epithelial / LPF 0-5 (*)    All other components within normal limits  ETHANOL  SALICYLATE LEVEL  CBC  RAPID URINE DRUG SCREEN, HOSP PERFORMED  CBG MONITORING, ED    EKG  EKG Interpretation None       Radiology Dg Chest 2 View  Result Date: 05/29/2017 CLINICAL DATA:  Altered mental status. EXAM: CHEST  2 VIEW COMPARISON:  PA and lateral chest 05/16/2017. CT chest 07/15/2011. PA and lateral chest 07/12/2011. FINDINGS: There is cardiomegaly without edema. Aortic atherosclerosis is noted. No pneumothorax or pleural effusion. No acute bony abnormality. IMPRESSION: Cardiomegaly without acute disease. Atherosclerosis. Electronically Signed   By: Inge Rise M.D.   On: 05/29/2017 19:02   Ct Angio Chest Pe W Or Wo Contrast  Result Date: 05/29/2017 CLINICAL DATA:  72 year old female with altered mental status. PE is suspected. EXAM: CT ANGIOGRAPHY CHEST WITH CONTRAST TECHNIQUE: Multidetector CT imaging of the chest was performed using the standard protocol during bolus  administration of intravenous contrast. Multiplanar CT image reconstructions and MIPs were obtained to evaluate the vascular anatomy. CONTRAST:  115mL ISOVUE-370 IOPAMIDOL (ISOVUE-370) INJECTION 76% COMPARISON:  Chest radiograph dated 05/29/2017 FINDINGS: Cardiovascular: Borderline cardiomegaly. No pericardial effusion. Coronary vascular calcification. There is advanced calcified and noncalcified plaque of the thoracic aorta. No aneurysmal dilatation or evidence of dissection. No CT evidence of pulmonary embolism. Mediastinum/Nodes: No hilar or mediastinal adenopathy. The esophagus and the thyroid gland are grossly unremarkable. No mediastinal fluid collection. Lungs/Pleura: Mild centrilobular emphysema. No focal consolidation, pleural effusion, or pneumothorax. The central airways are patent. Upper Abdomen: 2 adjacent small hypodense lesions in the dome of the liver measure up to 9 mm are not characterized on this CT. The visualized upper abdomen is otherwise unremarkable. Musculoskeletal: Degenerative changes of the spine. No acute osseous pathology. Review of the MIP images confirms the above  findings. IMPRESSION: 1. No acute intrathoracic pathology. No CT evidence of pulmonary embolism. 2. Mild cardiomegaly. 3. Aortic Atherosclerosis (ICD10-I70.0) and Emphysema (ICD10-J43.9). Electronically Signed   By: Anner Crete M.D.   On: 05/29/2017 21:48    Procedures Procedures (including critical care time)  Medications Ordered in ED Medications  sodium chloride 0.9 % injection (not administered)  iopamidol (ISOVUE-370) 76 % injection (100 mLs  Contrast Given 05/29/17 2131)     Initial Impression / Assessment and Plan / ED Course  I have reviewed the triage vital signs and the nursing notes.  Pertinent labs & imaging results that were available during my care of the patient were reviewed by me and considered in my medical decision making (see chart for details).     MDM:  Ct chest reviewed, no pe.  Emphysema,  Pt ambulated without 02.  Pt had 02 sats 84-87%.  Pt placed on 02.  Pt sats 94%.  Nurse reports she has significant confusion.   MRI from recent visit reviewed, Pt has chronic changes no acute abnormality    Final Clinical Impressions(s) / ED Diagnoses   Final diagnoses:  Confusion  Hypoxia  Pulmonary emphysema, unspecified emphysema type [facility])    ED Discharge Orders    None    I spoke to Hospitalist who will see for admission.    Sidney Ace 05/29/17 2326    Dorie Rank, MD 06/02/17 [phone]

## 2017-05-29 NOTE — H&P (Addendum)
History and Physical    Paula Andrews PJK:[id number] DOB: 05-06-1945 DOA: 05/29/2017  Referring MD/NP/PA: Marcene Brawn PA-C PCP: Celene Squibb, MD  Patient coming from: home via private vehicle   Chief Complaint: Confusion   I have personally briefly reviewed patient's old medical records in [place]   HPI: Paula Andrews is a 72 y.o. female with medical history significant of HTN, DM type II, dementia, hypothyroidism; who presents with complaints of worsening confusion and shortness of breath. Just recently admitted into [facility] from 2/13-2/15 after presenting with generalized weakness found to have significant hyponatremia secondary to dehydration.  At discharge it appears patient was taken off of her lisinopril/hydrochlorothiazide. Patient was seen at urgent care 3 days ago and diagnosed with a urinary tract infection for which she has been on nitrofurantoin. Since being home family notes that the patient's been more confused, and was unable to recognize her husband at times.  She is more lethargic and reportedly has been sleeping much of the day.  Husband reported O2 saturations as low as 67% 2 days ago while the patient was sleeping.  She has been using her husband's oxygen due to O2 sat dropping and feeling short of breath.  She was scheduled to have a sleep study done at [facility] today,  but presented to the hospital due to worsening symptoms.    ED Course: Upon admission into the emergency department patient was seen to be afebrile, pulse 57-65, respirations 20, blood pressure 158/68,  And O2 saturations 84% on room air improved to 95% on 2 L nasal cannula oxygen.  CBC, CMP, salicylate level, Tylenol level, and UDS were unremarkable.  Chest x-ray showed signs of cardiomegaly without acute disease.  Urinalysis showed rare bacteria, small leukocytes, and 6-30 WBCs.  CT angiogram of the chest was obtained due to patient's hypoxia, but did not reveal any signs of  pulmonary embolus and mild cardiomegaly.  TRH called to admit.  Asked PA-C to obtain ABG and BNP.  Review of Systems  Constitutional: Negative for chills and fever.       Positive for increased lethargy  HENT: Negative for congestion and nosebleeds.   Eyes: Negative for photophobia and pain.  Respiratory: Positive for shortness of breath. Negative for cough.   Cardiovascular: Positive for leg swelling. Negative for palpitations.  Gastrointestinal: Negative for abdominal pain, nausea and vomiting.  Genitourinary: Negative for hematuria and urgency.  Musculoskeletal: Negative for falls and myalgias.  Skin: Negative for itching and rash.  Neurological: Positive for weakness. Negative for focal weakness and loss of consciousness.  Psychiatric/Behavioral: Positive for memory loss. Negative for substance abuse.    Past Medical History:  Diagnosis Date  . Dementia   . Diabetes mellitus   . Hypertension   . Thyroid disease     Past Surgical History:  Procedure Laterality Date  . ABDOMINAL HYSTERECTOMY       reports that  has never smoked. she has never used smokeless tobacco. She reports that she does not drink alcohol or use drugs.  No Known Allergies  Family History  Problem Relation Age of Onset  . Diabetes Unknown     Prior to Admission medications   Medication Sig Start Date End Date Taking? Authorizing Provider  Ascorbic Acid (VITAMIN C) 1000 MG tablet Take 1,000 mg by mouth daily.   Yes [provider]  Calcium-Vitamin D 600-200 MG-UNIT per tablet Take 1 tablet by mouth daily.   Yes [provider]  levothyroxine (SYNTHROID, LEVOTHROID) 75 MCG tablet Take 75 mcg by mouth daily.    Yes [provider]  lisinopril (PRINIVIL,ZESTRIL) 5 MG tablet Take 5 mg by mouth daily.  05/26/17  Yes [provider]  Memantine HCl ER 21 MG CP24 Take 1 capsule by mouth daily.   Yes [provider]  metFORMIN (GLUCOPHAGE) 500 MG tablet Take 250 mg  by mouth 2 (two) times daily with a meal.    Yes [provider]  metoprolol succinate (TOPROL-XL) 25 MG 24 hr tablet Take 25 mg by mouth every morning.   Yes [provider]  nitrofurantoin, macrocrystal-monohydrate, (MACROBID) 100 MG capsule Take 100 mg by mouth 2 (two) times daily.  05/26/17  Yes [provider]  ondansetron (ZOFRAN) 4 MG tablet Take 4 mg by mouth every 8 (eight) hours as needed for nausea or vomiting.  05/26/17  Yes [provider]  pravastatin (PRAVACHOL) 20 MG tablet Take 20 mg by mouth daily.    Yes [provider]  amLODipine (NORVASC) 5 MG tablet Take 1 tablet (5 mg total) by mouth daily. Patient not taking: Reported on 05/29/2017 05/19/17 06/18/17  Irwin Brakeman L, MD  lisinopril (PRINIVIL,ZESTRIL) 10 MG tablet Take 1 tablet (10 mg total) by mouth daily. Patient not taking: Reported on 05/29/2017 05/18/17 06/17/17  Murlean Iba, MD    Physical Exam:  Constitutional: Elderly female in NAD, calm, comfortable Vitals:   05/29/17 1903 05/29/17 2212 05/29/17 2213 05/29/17 2215  BP:      Pulse:  (!) 57 65 61  Resp:      Temp:      TempSrc:      SpO2: 92% 91% (!) 87% 94%   Eyes: PERRL, lids and conjunctivae normal ENMT: Mucous membranes are moist. Posterior pharynx clear of any exudate or lesions. Neck: normal, supple, no masses, no thyromegaly Respiratory: Mildly decreased aeration.  No wheezes or rhonchi appreciated.  Normal respiratory effort. No accessory muscle use.  Cardiovascular: Regular rate and rhythm, no murmurs / rubs / gallops. No extremity edema. 2+ pedal pulses. No carotid bruits.  Abdomen: no tenderness, no masses palpated. No hepatosplenomegaly. Bowel sounds positive.  Musculoskeletal: no clubbing / cyanosis. No joint deformity upper and lower extremities. Good ROM, no contractures. Normal muscle tone.  Skin: no rashes, lesions, ulcers. No induration Neurologic: CN 2-12 grossly intact. Sensation intact,  DTR normal. Strength 5/5 in all 4.  Psychiatric: Dementia alert and oriented x 2.  Anxious mood.     Labs on Admission: I have personally reviewed following labs and imaging studies  CBC: Recent Labs  Lab 05/29/17 1727  WBC 8.1  HGB 13.9  HCT 43.4  MCV 95.8  PLT 093   Basic Metabolic Panel: Recent Labs  Lab 05/29/17 1727  NA 138  K 4.0  CL 97*  CO2 30  GLUCOSE 92  BUN 10  CREATININE 0.70  CALCIUM 9.5   GFR: Estimated Creatinine Clearance: 64.7 mL/min (by C-G formula based on SCr of 0.7 mg/dL). Liver Function Tests: Recent Labs  Lab 05/29/17 1727  AST 21  ALT 16  ALKPHOS 60  BILITOT 0.7  PROT 7.2  ALBUMIN 4.6   No results for input(s): LIPASE, AMYLASE in the last 168 hours. No results for input(s): AMMONIA in the last 168 hours. Coagulation Profile: No results for input(s): INR, PROTIME in the last 168 hours. Cardiac Enzymes: No results for input(s): CKTOTAL, CKMB, CKMBINDEX, TROPONINI in the last 168 hours. BNP (last 3  results) No results for input(s): PROBNP in the last 8760 hours. HbA1C: No results for input(s): HGBA1C in the last 72 hours. CBG: Recent Labs  Lab 05/29/17 1732  GLUCAP 89   Lipid Profile: No results for input(s): CHOL, HDL, LDLCALC, TRIG, CHOLHDL, LDLDIRECT in the last 72 hours. Thyroid Function Tests: No results for input(s): TSH, T4TOTAL, FREET4, T3FREE, THYROIDAB in the last 72 hours. Anemia Panel: No results for input(s): VITAMINB12, FOLATE, FERRITIN, TIBC, IRON, RETICCTPCT in the last 72 hours. Urine analysis:    Component Value Date/Time   COLORURINE YELLOW 05/29/2017 1728   APPEARANCEUR CLEAR 05/29/2017 1728   LABSPEC 1.014 05/29/2017 1728   PHURINE 6.0 05/29/2017 1728   GLUCOSEU NEGATIVE 05/29/2017 1728   HGBUR NEGATIVE 05/29/2017 1728   BILIRUBINUR NEGATIVE 05/29/2017 1728   KETONESUR 5 (A) 05/29/2017 1728   PROTEINUR NEGATIVE 05/29/2017 1728   NITRITE NEGATIVE 05/29/2017 1728   LEUKOCYTESUR SMALL (A) 05/29/2017  1728   Sepsis Labs: No results found for this or any previous visit (from the past 240 hour(s)).   Radiological Exams on Admission: Dg Chest 2 View  Result Date: 05/29/2017 CLINICAL DATA:  Altered mental status. EXAM: CHEST  2 VIEW COMPARISON:  PA and lateral chest 05/16/2017. CT chest 07/15/2011. PA and lateral chest 07/12/2011. FINDINGS: There is cardiomegaly without edema. Aortic atherosclerosis is noted. No pneumothorax or pleural effusion. No acute bony abnormality. IMPRESSION: Cardiomegaly without acute disease. Atherosclerosis. Electronically Signed   By: Inge Rise M.D.   On: 05/29/2017 19:02   Ct Angio Chest Pe W Or Wo Contrast  Result Date: 05/29/2017 CLINICAL DATA:  72 year old female with altered mental status. PE is suspected. EXAM: CT ANGIOGRAPHY CHEST WITH CONTRAST TECHNIQUE: Multidetector CT imaging of the chest was performed using the standard protocol during bolus administration of intravenous contrast. Multiplanar CT image reconstructions and MIPs were obtained to evaluate the vascular anatomy. CONTRAST:  174mL ISOVUE-370 IOPAMIDOL (ISOVUE-370) INJECTION 76% COMPARISON:  Chest radiograph dated 05/29/2017 FINDINGS: Cardiovascular: Borderline cardiomegaly. No pericardial effusion. Coronary vascular calcification. There is advanced calcified and noncalcified plaque of the thoracic aorta. No aneurysmal dilatation or evidence of dissection. No CT evidence of pulmonary embolism. Mediastinum/Nodes: No hilar or mediastinal adenopathy. The esophagus and the thyroid gland are grossly unremarkable. No mediastinal fluid collection. Lungs/Pleura: Mild centrilobular emphysema. No focal consolidation, pleural effusion, or pneumothorax. The central airways are patent. Upper Abdomen: 2 adjacent small hypodense lesions in the dome of the liver measure up to 9 mm are not characterized on this CT. The visualized upper abdomen is otherwise unremarkable. Musculoskeletal: Degenerative changes of the  spine. No acute osseous pathology. Review of the MIP images confirms the above findings. IMPRESSION: 1. No acute intrathoracic pathology. No CT evidence of pulmonary embolism. 2. Mild cardiomegaly. 3. Aortic Atherosclerosis (ICD10-I70.0) and Emphysema (ICD10-J43.9). Electronically Signed   By: Anner Crete M.D.   On: 05/29/2017 21:48    Chest x-ray: Independently reviewed.  Chest x-ray showing mild cardiomegaly no clear signs of edema  Assessment/Plan Acute respiratory failure with hypoxia and hypercapnia, history of emphysema: acute.  Patient presents with complaints of shortness of breath.  Past medical history significant for remote history of tobacco abuse.  O2 saturations noted to be as low as 84% on arrival on room air.   ABG obtained showing normal pH 7.398 with PCO2 of 50.5 and PO2 of 82.7 reported on room air, but patient had been on 2 L of nasal cannula oxygen during most of her time in the ED. On differential  included possibility of pulmonary edema although chest x-ray appeared clear with mild cardiomegaly since patient recently stopped taking hydrochlorothiazide. - Admit to a telemetry bed  - Continuous pulse oximetry overnight with nasal cannula oxygen as needed - DuoNebs prn sob/wheezing - Follow-up ABG and BNP - Strict I&O's and daily weights - May warrant echocardiogram  - Social work consult for need of home oxygen  Acute encephalopathy, history of dementia: Patient presents with worsening confusion.  Initial workup unremarkable.   Worsening periods of hypercapnia and hypoxia likely cause of patient's increased lethargy and confusion.  Also question possibility of underlying infection although patient has been taking nitrofurantoin for UTI vs. recent medication changes vs other.  Patient had been scheduled to have sleep study in [facility] today, but did not make appointment. - Continue Memantine - Patient will benefit from rescheduling sleep study  Urinary tract infection:  Patient has been on nitrofurantoin for the last 4 days.  Urinalysis appears abnormal. - Check urine culture - Hold nitrofurantoin - Rocephin IV x1 dose  Essential hypertension - Continue metoprolol and lisinopril  Hypothyroidism: Last TSH noted to be 2.24 on 05/16/2017. - Continue levothyroxine   Diabetes mellitus type 2: Appears well controlled on home medications of metformin. - Hypoglycemic protocols - Hold metformin   - CBGs with sensitive SSI  Hyperlipidemia - Continue pravastatin    DVT prophylaxis: Lovenox Code Status: Full  Family Communication: Discussed plan of care with the patient and family present at bedside Disposition Plan: TBD Consults called: None  Admission status: Observation  Norval Morton MD [facility] Pager [phone]   If 7PM-7AM, please contact night-coverage [email or web address] Password [facility]  05/29/2017, 11:07 PM

## 2017-05-29 NOTE — ED Notes (Signed)
Spoke with lab technician about performing urinalysis from urine already in lab. Per technician,test will be performed since urine available is sufficient to perform test.

## 2017-05-29 NOTE — ED Notes (Signed)
Bed: WA27 Expected date:  Expected time:  Means of arrival:  Comments: Hold for 15

## 2017-05-29 NOTE — ED Triage Notes (Addendum)
Patient here from home with complaints of altered mental status. Reports increased dementia. Admitted for hyponatremia, family feels that it may be the same. Normally does not wear oxygen. 88% RA.  Family reports that patient has been suicidal and threatening to kill her husband. Walking out in the middle of the road. Patient does not report SI/HI at this moment.

## 2017-05-30 ENCOUNTER — Ambulatory Visit (HOSPITAL_COMMUNITY): Payer: Medicare Other

## 2017-05-30 ENCOUNTER — Other Ambulatory Visit: Payer: Self-pay

## 2017-05-30 DIAGNOSIS — I119 Hypertensive heart disease without heart failure: Secondary | ICD-10-CM | POA: Diagnosis present

## 2017-05-30 DIAGNOSIS — Z8673 Personal history of transient ischemic attack (TIA), and cerebral infarction without residual deficits: Secondary | ICD-10-CM | POA: Diagnosis not present

## 2017-05-30 DIAGNOSIS — R450 Nervousness: Secondary | ICD-10-CM | POA: Diagnosis not present

## 2017-05-30 DIAGNOSIS — E785 Hyperlipidemia, unspecified: Secondary | ICD-10-CM

## 2017-05-30 DIAGNOSIS — R0602 Shortness of breath: Secondary | ICD-10-CM | POA: Diagnosis not present

## 2017-05-30 DIAGNOSIS — I10 Essential (primary) hypertension: Secondary | ICD-10-CM | POA: Diagnosis present

## 2017-05-30 DIAGNOSIS — Z79890 Hormone replacement therapy (postmenopausal): Secondary | ICD-10-CM | POA: Diagnosis not present

## 2017-05-30 DIAGNOSIS — F4325 Adjustment 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: Secondary | ICD-10-CM | POA: Diagnosis not present

## 2017-05-30 DIAGNOSIS — Z810 Family history of intellectual disabilities: Secondary | ICD-10-CM | POA: Diagnosis not present

## 2017-05-30 DIAGNOSIS — E119 Type 2 diabetes mellitus without complications: Secondary | ICD-10-CM

## 2017-05-30 DIAGNOSIS — J9622 Acute and chronic respiratory failure with hypercapnia: Secondary | ICD-10-CM | POA: Diagnosis present

## 2017-05-30 DIAGNOSIS — J9611 Chronic respiratory failure with hypoxia: Secondary | ICD-10-CM | POA: Diagnosis not present

## 2017-05-30 DIAGNOSIS — F0390 Unspecified dementia without behavioral disturbance: Secondary | ICD-10-CM | POA: Diagnosis present

## 2017-05-30 DIAGNOSIS — G4733 Obstructive sleep apnea (adult) (pediatric): Secondary | ICD-10-CM | POA: Diagnosis present

## 2017-05-30 DIAGNOSIS — J9612 Chronic respiratory failure with hypercapnia: Secondary | ICD-10-CM | POA: Diagnosis not present

## 2017-05-30 DIAGNOSIS — Z833 Family history of diabetes mellitus: Secondary | ICD-10-CM | POA: Diagnosis not present

## 2017-05-30 DIAGNOSIS — E039 Hypothyroidism, unspecified: Secondary | ICD-10-CM | POA: Diagnosis present

## 2017-05-30 DIAGNOSIS — Z79899 Other long term (current) drug therapy: Secondary | ICD-10-CM | POA: Diagnosis not present

## 2017-05-30 DIAGNOSIS — Z87891 Personal history of nicotine dependence: Secondary | ICD-10-CM | POA: Diagnosis not present

## 2017-05-30 DIAGNOSIS — E118 Type 2 diabetes mellitus with unspecified complications: Secondary | ICD-10-CM

## 2017-05-30 DIAGNOSIS — R0902 Hypoxemia: Secondary | ICD-10-CM

## 2017-05-30 DIAGNOSIS — N390 Urinary tract infection, site not specified: Secondary | ICD-10-CM | POA: Diagnosis present

## 2017-05-30 DIAGNOSIS — J9602 Acute respiratory failure with hypercapnia: Secondary | ICD-10-CM | POA: Diagnosis not present

## 2017-05-30 DIAGNOSIS — J439 Emphysema, unspecified: Secondary | ICD-10-CM | POA: Diagnosis not present

## 2017-05-30 DIAGNOSIS — R0600 Dyspnea, unspecified: Secondary | ICD-10-CM | POA: Diagnosis not present

## 2017-05-30 DIAGNOSIS — J449 Chronic obstructive pulmonary disease, unspecified: Secondary | ICD-10-CM | POA: Diagnosis present

## 2017-05-30 DIAGNOSIS — E1151 Type 2 diabetes mellitus with diabetic peripheral angiopathy without gangrene: Secondary | ICD-10-CM | POA: Diagnosis present

## 2017-05-30 DIAGNOSIS — J9601 Acute respiratory failure with hypoxia: Secondary | ICD-10-CM | POA: Diagnosis not present

## 2017-05-30 DIAGNOSIS — F419 Anxiety disorder, unspecified: Secondary | ICD-10-CM | POA: Diagnosis not present

## 2017-05-30 DIAGNOSIS — Z7984 Long term (current) use of oral hypoglycemic drugs: Secondary | ICD-10-CM | POA: Diagnosis not present

## 2017-05-30 DIAGNOSIS — J9621 Acute and chronic respiratory failure with hypoxia: Secondary | ICD-10-CM | POA: Diagnosis present

## 2017-05-30 DIAGNOSIS — R410 Disorientation, unspecified: Secondary | ICD-10-CM

## 2017-05-30 DIAGNOSIS — G9349 Other encephalopathy: Secondary | ICD-10-CM | POA: Diagnosis present

## 2017-05-30 DIAGNOSIS — Z90710 Acquired absence of both cervix and uterus: Secondary | ICD-10-CM | POA: Diagnosis not present

## 2017-05-30 DIAGNOSIS — E871 Hypo-osmolality and hyponatremia: Secondary | ICD-10-CM | POA: Diagnosis present

## 2017-05-30 LAB — CBC WITH DIFFERENTIAL/PLATELET
BASOS ABS: 0.1 10*3/uL (ref 0.0–0.1)
BASOS PCT: 1 %
EOS ABS: 0.1 10*3/uL (ref 0.0–0.7)
EOS PCT: 1 %
HCT: 41.7 % (ref 36.0–46.0)
Hemoglobin: 13.4 g/dL (ref 12.0–15.0)
Lymphocytes Relative: 18 %
Lymphs Abs: 1.2 10*3/uL (ref 0.7–4.0)
MCH: 31.2 pg (ref 26.0–34.0)
MCHC: 32.1 g/dL (ref 30.0–36.0)
MCV: 97 fL (ref 78.0–100.0)
Monocytes Absolute: 0.4 10*3/uL (ref 0.1–1.0)
Monocytes Relative: 5 %
Neutro Abs: 5.1 10*3/uL (ref 1.7–7.7)
Neutrophils Relative %: 75 %
PLATELETS: 267 10*3/uL (ref 150–400)
RBC: 4.3 MIL/uL (ref 3.87–5.11)
RDW: 13.1 % (ref 11.5–15.5)
WBC: 6.8 10*3/uL (ref 4.0–10.5)

## 2017-05-30 LAB — BLOOD GAS, ARTERIAL
Acid-Base Excess: 5 mmol/L — ABNORMAL HIGH (ref 0.0–2.0)
Bicarbonate: 30.7 mmol/L — ABNORMAL HIGH (ref 20.0–28.0)
DRAWN BY: 11249
FIO2: 21
O2 SAT: 95.8 %
PO2 ART: 82.7 mmHg — AB (ref 83.0–108.0)
Patient temperature: 97.7
pCO2 arterial: 50.5 mmHg — ABNORMAL HIGH (ref 32.0–48.0)
pH, Arterial: 7.398 (ref 7.350–7.450)

## 2017-05-30 LAB — COMPREHENSIVE METABOLIC PANEL
ALT: 14 U/L (ref 14–54)
AST: 20 U/L (ref 15–41)
Albumin: 3.8 g/dL (ref 3.5–5.0)
Alkaline Phosphatase: 60 U/L (ref 38–126)
Anion gap: 9 (ref 5–15)
BUN: 8 mg/dL (ref 6–20)
CHLORIDE: 96 mmol/L — AB (ref 101–111)
CO2: 31 mmol/L (ref 22–32)
Calcium: 9 mg/dL (ref 8.9–10.3)
Creatinine, Ser: 0.74 mg/dL (ref 0.44–1.00)
GFR calc Af Amer: >60 mL/min (ref >60)
Glucose, Bld: 170 mg/dL — ABNORMAL HIGH (ref 65–99)
POTASSIUM: 4.1 mmol/L (ref 3.5–5.1)
SODIUM: 136 mmol/L (ref 135–145)
Total Bilirubin: 0.6 mg/dL (ref 0.3–1.2)
Total Protein: 6.3 g/dL — ABNORMAL LOW (ref 6.5–8.1)

## 2017-05-30 LAB — CBG MONITORING, ED
GLUCOSE-CAPILLARY: 122 mg/dL — AB (ref 65–99)
Glucose-Capillary: 114 mg/dL — ABNORMAL HIGH (ref 65–99)

## 2017-05-30 LAB — GLUCOSE, CAPILLARY
GLUCOSE-CAPILLARY: 98 mg/dL (ref 65–99)
Glucose-Capillary: 80 mg/dL (ref 65–99)

## 2017-05-30 LAB — PHOSPHORUS: PHOSPHORUS: 4.2 mg/dL (ref 2.5–4.6)

## 2017-05-30 LAB — MAGNESIUM: MAGNESIUM: 1.4 mg/dL — AB (ref 1.7–2.4)

## 2017-05-30 MED ORDER — GUAIFENESIN ER 600 MG PO TB12
1200.0000 mg | ORAL_TABLET | Freq: Two times a day (BID) | ORAL | Status: DC
  Administered 2017-05-31 – 2017-06-01 (×3): 1200 mg via ORAL
  Filled 2017-05-30 (×3): qty 2

## 2017-05-30 MED ORDER — HYDROXYZINE HCL 25 MG PO TABS: 25.0000 mg | ORAL_TABLET | ORAL | Status: AC

## 2017-05-30 MED ORDER — SODIUM CHLORIDE 0.9 % IV SOLN
2.0000 g | INTRAVENOUS | Status: DC
  Administered 2017-05-30 – 2017-05-31 (×2): 2 g via INTRAVENOUS
  Filled 2017-05-30: qty 2
  Filled 2017-05-30: qty 20
  Filled 2017-05-30: qty 2
  Filled 2017-05-30: qty 20

## 2017-05-30 MED ORDER — HYDROXYZINE HCL 10 MG PO TABS
10.0000 mg | ORAL_TABLET | Freq: Once | ORAL | Status: DC
  Administered 2017-05-30: 10 mg via ORAL
  Filled 2017-05-30: qty 1

## 2017-05-30 MED ORDER — HYDROXYZINE HCL 10 MG PO TABS
15.0000 mg | ORAL_TABLET | Freq: Once | ORAL | Status: AC
  Administered 2017-05-30: 15 mg via ORAL
  Filled 2017-05-30: qty 2

## 2017-05-30 MED ORDER — SODIUM CHLORIDE 0.9 % IV SOLN
2.0000 g | Freq: Once | INTRAVENOUS | Status: AC
  Administered 2017-05-30: 2 g via INTRAVENOUS
  Filled 2017-05-30: qty 2

## 2017-05-30 MED ORDER — MAGNESIUM SULFATE 50 % IJ SOLN
3.0000 g | Freq: Once | INTRAMUSCULAR | Status: AC
  Administered 2017-05-30: 3 g via INTRAVENOUS
  Filled 2017-05-30: qty 6

## 2017-05-30 NOTE — Progress Notes (Signed)
Order placed for 15 mg of hydroxyzine (Vistaril) per pharmacy's suggestion since pt had only had 10 mg of a 25 dose ordered by the MD. Hale Bogus.

## 2017-05-30 NOTE — Progress Notes (Signed)
PROGRESS NOTE    Paula Andrews  QQV:[id number] DOB: 1945/11/25 DOA: 05/29/2017 PCP: Celene Squibb, MD  Brief Narrative:   Paula Andrews is a 72 y.o. female with medical history significant of HTN, DM type II, dementia, hypothyroidism; who presented with complaints of worsening confusion and shortness of breath. Just recently admitted into [facility] from 2/13-2/15 after presenting with generalized weakness and found to have significant hyponatremia secondary to dehydration.  At discharge it appears patient was taken off of her lisinopril/hydrochlorothiazide. Patient was seen at urgent care 3 days ago and diagnosed with a urinary tract infection for which she has been on nitrofurantoin. Since being home family notes that the patient's been more confused, and was unable to recognize her husband at times.  She is more lethargic and reportedly has been sleeping much of the day.  Husband reported O2 saturations as low as 67% 2 days ago while the patient was sleeping.  She has been using her husband's oxygen due to O2 sat dropping and feeling short of breath.  She was scheduled to have a sleep study done at [facility] on the day of admission,  but presented to the hospital due to worsening symptoms. Admitted for Acute Respiratory Failure with Hypoxia and Hypercapnia and currently undergoing further workup and evaluation.    Assessment & Plan:   Principal Problem:   Acute respiratory failure with hypoxia and hypercapnia (HCC) Active Problems:   Acute lower UTI   Essential hypertension   Diabetes mellitus type II, controlled ([place])   Hyperlipemia   UTI (urinary tract infection)  Acute Respiratory Failure with hypoxia and hypercapnia in a patient with a History of Emphysema:  -Acute.  Patient presents with complaints of shortness of breath.   -Past medical history significant for remote history of tobacco abuse.   -O2 saturations noted to be as low as 84% on arrival on room air.     -ABG obtained showing normal pH 7.398 with PCO2 of 50.5 and PO2 of 82.7 reported on room air, but patient had been on 2 L of nasal cannula oxygen during most of her time in the ED. On differential included possibility of pulmonary edema, although chest x-ray appeared clear with mild cardiomegaly since patient recently stopped taking hydrochlorothiazide. -Admitted to a Telemetry bed  -Continuous pulse oximetry overnight with nasal cannula oxygen as needed -Maintain O2 Saturations >92%  -DuoNebs q6hprn sob/wheezing -Strict I&O's and Daily Weights -Will Check Echocardiogram and obtain BNP -C/w IV Abx with Ceftriaxone; May escalate if not improving  -CT Scan of Chest to r/o PE showed No acute intrathoracic pathology. No CT evidence of pulmonary embolism, Mild cardiomegaly. Aortic Atherosclerosis and Mild Centrilobular Emphysema, and no focal consolidation, pleural effusion, or Pneumothorax   -Will add Flutter Valve, Incentive Spirometry, and add Guaifenesin 1200 mg po daily -Will do Ambulatory Home O2 Screen in AM  -If not improving will discuss with Pulmonary   Acute Encephalopathy in a Patient with a History of Dementia, improving  -Patient presents with worsening confusion.  Initial workup unremarkable.  -Worsening periods of hypercapnia and hypoxia likely cause of patient's increased lethargy and confusion.  -Also question possibility of underlying infection although patient has been taking nitrofurantoin for UTI vs. recent medication changes vs other.   -Patient had been scheduled to have sleep study in [facility] today, but did not make appointment. -C/w Memantine 21 mg po Daily  -Delirium Precautions  -Recent Head CT 05/16/17 showed No acute intracranial process. 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  Moderate to severe chronic small vessel ischemic disease -Patient will benefit from rescheduling sleep study  Urinary Tract Infection  -Patient has been on nitrofurantoin  for the last 4 days.  Urinalysis appears abnormal. -Check urine culture -Hold nitrofurantoin -Started Empirical IV Ceftriaxone   Essential Hypertension -C/w Metoprolol 25 mg po Daily and Lisinopril 5 mg po Daily   Hypothyroidism -Last TSH noted to be 2.24 on 05/16/2017. -Continue Levothyroxine 75 mcg po Daily    Diabetes Mellitus Type 2 -Appears well controlled on home medications of metformin. -Hypoglycemic protocols -Hold Home Metformin   -C/w Sensitive Novolog SSI AC -CBG's ranging from 80-128  Hyperlipidemia -Continue Pravastatin 20 mg po qHS  Hypomagnesemia -Patient's Mag Level was 1.4 -Replete with IV Mag Sulfate 3 grams -Continue to Monitor and Replete Mag in AM  -Repeat Mag Level in AM   DVT prophylaxis: Enoxaparin 40 mg sq Daily qHS Code Status: FULL CODE Family Communication: No family present at bedside  Disposition Plan: Obtain PT/OT Evaluations and Anticipate D/C in next 24-48 hours if improved   Consultants:   None   Procedures: None  Antimicrobials:  Anti-infectives (From admission, onward)   Start     Dose/Rate Route Frequency Ordered Stop   05/30/17 2200  cefTRIAXone (ROCEPHIN) 2 g in sodium chloride 0.9 % 100 mL IVPB     2 g 200 mL/hr over 30 Minutes Intravenous Every 24 hours 05/30/17 0538     05/30/17 0100  cefTRIAXone (ROCEPHIN) 2 g in sodium chloride 0.9 % 100 mL IVPB     2 g 200 mL/hr over 30 Minutes Intravenous  Once 05/30/17 0057 05/30/17 0243     Subjective: Seen and examined and was feeling a little bit better than yesterday. No CP. SOB stable. No lightheadedness or dizziness.  Objective: Vitals:   05/30/17 0607 05/30/17 0640 05/30/17 1154 05/30/17 1539  BP: (!) 139/55  (!) 130/57 (!) 139/59  Pulse: 74  64 63  Resp: 20 20 18 18   Temp: 97.9 F (36.6 C)  100 F (37.8 C) 98.5 F (36.9 C)  TempSrc: Oral  Oral Oral  SpO2: (!) 87%  90% 91%  Weight:    72.4 kg (159 lb 9.8 oz)  Height:    5\' 5"  (1.651 m)    Intake/Output  Summary (Last 24 hours) at 05/30/2017 1707 Last data filed at 05/30/2017 1029 Gross per 24 hour  Intake 960 ml  Output -  Net 960 ml   Filed Weights   05/30/17 1539  Weight: 72.4 kg (159 lb 9.8 oz)   Examination: Physical Exam:  Constitutional: WN/WD Caucasian female in NAD and appears calm and comfortable Eyes: Lids and conjunctivae normal, sclerae anicteric  ENMT: External Ears, Nose appear normal. Grossly normal hearing. Mucous membranes are moist. Neck: Appears normal, supple, no cervical masses, normal ROM, no appreciable thyromegaly, no JVD Respiratory: Diminished to auscultation bilaterally, no wheezing, rales, rhonchi or crackles appreciated. Normal respiratory effort and patient is not tachypenic. No accessory muscle use.  Cardiovascular: RRR, no murmurs / rubs / gallops. Had no LE edema.  Abdomen: Soft, non-tender, non-distended. No masses palpated. No appreciable hepatosplenomegaly. Bowel sounds positive x4.  GU: Deferred. Musculoskeletal: No clubbing / cyanosis of digits/nails. No joint deformity upper and lower extremities. Good ROM, no contractures. Normal strength and muscle tone.  Skin: No rashes, lesions, ulcers on a limited skin eval. No induration; Warm and dry.  Neurologic: CN 2-12 grossly intact with no focal deficits. Romberg  sign and cerebellar reflexes not assessed.  Psychiatric: Normal judgment and insight. Alert and oriented x 2. Normal and pleasant mood and appropriate affect.   Data Reviewed: I have personally reviewed following labs and imaging studies  CBC: Recent Labs  Lab 05/29/17 1727 05/30/17 1037  WBC 8.1 6.8  NEUTROABS  --  5.1  HGB 13.9 13.4  HCT 43.4 41.7  MCV 95.8 97.0  PLT 300 382   Basic Metabolic Panel: Recent Labs  Lab 05/29/17 1727 05/30/17 1037  NA 138 136  K 4.0 4.1  CL 97* 96*  CO2 30 31  GLUCOSE 92 170*  BUN 10 8  CREATININE 0.70 0.74  CALCIUM 9.5 9.0  MG  --  1.4*  PHOS  --  4.2   GFR: Estimated Creatinine  Clearance: 64.4 mL/min (by C-G formula based on SCr of 0.74 mg/dL). Liver Function Tests: Recent Labs  Lab 05/29/17 1727 05/30/17 1037  AST 21 20  ALT 16 14  ALKPHOS 60 60  BILITOT 0.7 0.6  PROT 7.2 6.3*  ALBUMIN 4.6 3.8   No results for input(s): LIPASE, AMYLASE in the last 168 hours. No results for input(s): AMMONIA in the last 168 hours. Coagulation Profile: No results for input(s): INR, PROTIME in the last 168 hours. Cardiac Enzymes: No results for input(s): CKTOTAL, CKMB, CKMBINDEX, TROPONINI in the last 168 hours. BNP (last 3 results) No results for input(s): PROBNP in the last 8760 hours. HbA1C: No results for input(s): HGBA1C in the last 72 hours. CBG: Recent Labs  Lab 05/29/17 1732 05/30/17 0004 05/30/17 0919 05/30/17 1146 05/30/17 1636  GLUCAP 89 128* 114* 122* 80   Lipid Profile: No results for input(s): CHOL, HDL, LDLCALC, TRIG, CHOLHDL, LDLDIRECT in the last 72 hours. Thyroid Function Tests: No results for input(s): TSH, T4TOTAL, FREET4, T3FREE, THYROIDAB in the last 72 hours. Anemia Panel: No results for input(s): VITAMINB12, FOLATE, FERRITIN, TIBC, IRON, RETICCTPCT in the last 72 hours. Sepsis Labs: No results for input(s): PROCALCITON, LATICACIDVEN in the last 168 hours.  No results found for this or any previous visit (from the past 240 hour(s)).   Radiology Studies: Dg Chest 2 View  Result Date: 05/29/2017 CLINICAL DATA:  Altered mental status. EXAM: CHEST  2 VIEW COMPARISON:  PA and lateral chest 05/16/2017. CT chest 07/15/2011. PA and lateral chest 07/12/2011. FINDINGS: There is cardiomegaly without edema. Aortic atherosclerosis is noted. No pneumothorax or pleural effusion. No acute bony abnormality. IMPRESSION: Cardiomegaly without acute disease. Atherosclerosis. Electronically Signed   By: Inge Rise M.D.   On: 05/29/2017 19:02   Ct Angio Chest Pe W Or Wo Contrast  Result Date: 05/29/2017 CLINICAL DATA:  72 year old female with altered  mental status. PE is suspected. EXAM: CT ANGIOGRAPHY CHEST WITH CONTRAST TECHNIQUE: Multidetector CT imaging of the chest was performed using the standard protocol during bolus administration of intravenous contrast. Multiplanar CT image reconstructions and MIPs were obtained to evaluate the vascular anatomy. CONTRAST:  189mL ISOVUE-370 IOPAMIDOL (ISOVUE-370) INJECTION 76% COMPARISON:  Chest radiograph dated 05/29/2017 FINDINGS: Cardiovascular: Borderline cardiomegaly. No pericardial effusion. Coronary vascular calcification. There is advanced calcified and noncalcified plaque of the thoracic aorta. No aneurysmal dilatation or evidence of dissection. No CT evidence of pulmonary embolism. Mediastinum/Nodes: No hilar or mediastinal adenopathy. The esophagus and the thyroid gland are grossly unremarkable. No mediastinal fluid collection. Lungs/Pleura: Mild centrilobular emphysema. No focal consolidation, pleural effusion, or pneumothorax. The central airways are patent. Upper Abdomen: 2 adjacent small hypodense lesions in the dome of the liver  measure up to 9 mm are not characterized on this CT. The visualized upper abdomen is otherwise unremarkable. Musculoskeletal: Degenerative changes of the spine. No acute osseous pathology. Review of the MIP images confirms the above findings. IMPRESSION: 1. No acute intrathoracic pathology. No CT evidence of pulmonary embolism. 2. Mild cardiomegaly. 3. Aortic Atherosclerosis (ICD10-I70.0) and Emphysema (ICD10-J43.9). Electronically Signed   By: Anner Crete M.D.   On: 05/29/2017 21:48   Scheduled Meds: . enoxaparin (LOVENOX) injection  40 mg Subcutaneous QHS  . hydrOXYzine  25 mg Oral STAT  . insulin aspart  0-9 Units Subcutaneous TID WC  . levothyroxine  75 mcg Oral QAC breakfast  . lisinopril  5 mg Oral Daily  . memantine  21 mg Oral Daily  . metoprolol succinate  25 mg Oral q morning - 10a  . pravastatin  20 mg Oral q1800   Continuous Infusions: . cefTRIAXone  (ROCEPHIN)  IV      LOS: 0 days   Kerney Elbe, DO [facility] Pager [phone]  If 7PM-7AM, please contact night-coverage [email or web address] Password [facility] 05/30/2017, 5:07 PM

## 2017-05-30 NOTE — Progress Notes (Signed)
RN paged NP because pt desatted into the 70s while sleeping on RA, quickly up to 95% on 3L. However, her mental status is more lethargic and difficult to arouse. NP ordered ABG and went to bedside. S: pt unable to participate in ROS due to mental status. Per NT, pt found to have desatted during regular vitals. Pt was sleeping at time and snoring.  Earlier tonight, per RN, pt was talking, alert and watching TV.  O: Chronically ill appearing WF in NAD. Eyes closed and forces them shut when attempt is made to open them by NP. MOE x 4 to tactile stimulation/painful stimuli. When arms lifted, she keeps them from flailing to the bed. Will not follow commands at first, says "uh huh". Says "I don't know" when asked orientation questions. Says "I can't" when asked to follow commands. No facial droop noted. Speech is clear. Quickly goes back to sleep after awake for a short time.  A/P: 1. Desaturation/resp failure-very acute and quickly resolved. pt has empysema and came in with acute resp failure/hypoxia and hypercarbia. NP doubts pt would develop hypercarbia over a 10 min period of time. This presents like sleep apnea. Will leave her on just 2L per [place] and do continuous pulse ox. If desats again, try CPAP.  2. Lethargy-do not see anything that is focal neuro deficit at present. Will watch for now. Probably sleep apnea presentation.  CRITICAL CARE Performed by: Clance Boll   Total critical care time: 35 minutes  Critical care time was exclusive of separately billable procedures and treating other patients.  Critical care was necessary to treat or prevent imminent or life-threatening deterioration.  Critical care was time spent personally by me on the following activities: development of treatment plan with patient and/or surrogate as well as nursing, discussions with consultants, evaluation of patient's response to treatment, examination of patient, obtaining history from patient or surrogate,  ordering and performing treatments and interventions, ordering and review of laboratory studies, ordering and review of radiographic studies, pulse oximetry and re-evaluation of patient's condition.

## 2017-05-30 NOTE — Progress Notes (Signed)
PHARMACY NOTE -  ANTIBIOTIC RENAL DOSE ADJUSTMENT   Request received for Pharmacy to assist with antibiotic renal dose adjustment.  Patient has been initiated on Ceftriaxone 2gm iv q24hr for UTI. SCr 0.7, estimated CrCl 64 ml/min Current dosage is appropriate and need for further dosage adjustment appears unlikely at present. Will sign off at this time.  Please reconsult if a change in clinical status warrants re-evaluation of dosage.

## 2017-05-30 NOTE — ED Notes (Signed)
Report given to Floor RN 

## 2017-05-30 NOTE — Progress Notes (Signed)
Order for suicide sitter placed by admitting MD but no mention in H&P or otherwise. Only mentioned one time by RN. When floor RN received report, ED RN reported that Psych MD "cleared patient" but no note from Psych is charted. Attending MD made aware. Will follow through with suicide precautions.

## 2017-05-31 ENCOUNTER — Inpatient Hospital Stay (HOSPITAL_COMMUNITY): Payer: Medicare Other

## 2017-05-31 DIAGNOSIS — R450 Nervousness: Secondary | ICD-10-CM

## 2017-05-31 DIAGNOSIS — J9612 Chronic respiratory failure with hypercapnia: Secondary | ICD-10-CM

## 2017-05-31 DIAGNOSIS — F4325 Adjustment 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: Secondary | ICD-10-CM

## 2017-05-31 DIAGNOSIS — J9611 Chronic respiratory failure with hypoxia: Secondary | ICD-10-CM

## 2017-05-31 DIAGNOSIS — Z810 Family history of intellectual disabilities: Secondary | ICD-10-CM

## 2017-05-31 DIAGNOSIS — F419 Anxiety disorder, unspecified: Secondary | ICD-10-CM

## 2017-05-31 DIAGNOSIS — R0600 Dyspnea, unspecified: Secondary | ICD-10-CM

## 2017-05-31 LAB — COMPREHENSIVE METABOLIC PANEL
ALT: 12 U/L — AB (ref 14–54)
AST: 17 U/L (ref 15–41)
Albumin: 3.9 g/dL (ref 3.5–5.0)
Alkaline Phosphatase: 56 U/L (ref 38–126)
Anion gap: 9 (ref 5–15)
BUN: 12 mg/dL (ref 6–20)
CALCIUM: 9.1 mg/dL (ref 8.9–10.3)
CO2: 31 mmol/L (ref 22–32)
CREATININE: 0.81 mg/dL (ref 0.44–1.00)
Chloride: 99 mmol/L — ABNORMAL LOW (ref 101–111)
GFR calc non Af Amer: >60 mL/min (ref >60)
Glucose, Bld: 123 mg/dL — ABNORMAL HIGH (ref 65–99)
POTASSIUM: 3.9 mmol/L (ref 3.5–5.1)
SODIUM: 139 mmol/L (ref 135–145)
TOTAL PROTEIN: 6.2 g/dL — AB (ref 6.5–8.1)
Total Bilirubin: 0.5 mg/dL (ref 0.3–1.2)

## 2017-05-31 LAB — CBC WITH DIFFERENTIAL/PLATELET
BASOS ABS: 0.1 10*3/uL (ref 0.0–0.1)
Basophils Relative: 1 %
EOS ABS: 0.2 10*3/uL (ref 0.0–0.7)
EOS PCT: 2 %
HCT: 40.3 % (ref 36.0–46.0)
Hemoglobin: 12.9 g/dL (ref 12.0–15.0)
LYMPHS ABS: 2 10*3/uL (ref 0.7–4.0)
Lymphocytes Relative: 26 %
MCH: 31.1 pg (ref 26.0–34.0)
MCHC: 32 g/dL (ref 30.0–36.0)
MCV: 97.1 fL (ref 78.0–100.0)
MONO ABS: 0.6 10*3/uL (ref 0.1–1.0)
Monocytes Relative: 8 %
Neutro Abs: 4.9 10*3/uL (ref 1.7–7.7)
Neutrophils Relative %: 63 %
Platelets: 301 10*3/uL (ref 150–400)
RBC: 4.15 MIL/uL (ref 3.87–5.11)
RDW: 13.2 % (ref 11.5–15.5)
WBC: 7.7 10*3/uL (ref 4.0–10.5)

## 2017-05-31 LAB — URINE CULTURE: CULTURE: NO GROWTH

## 2017-05-31 LAB — GLUCOSE, CAPILLARY
GLUCOSE-CAPILLARY: 88 mg/dL (ref 65–99)
GLUCOSE-CAPILLARY: 91 mg/dL (ref 65–99)
Glucose-Capillary: 101 mg/dL — ABNORMAL HIGH (ref 65–99)
Glucose-Capillary: 143 mg/dL — ABNORMAL HIGH (ref 65–99)

## 2017-05-31 LAB — MAGNESIUM: Magnesium: 2.2 mg/dL (ref 1.7–2.4)

## 2017-05-31 LAB — ECHOCARDIOGRAM COMPLETE
Height: 65 in
Weight: 2553.81 oz

## 2017-05-31 LAB — BRAIN NATRIURETIC PEPTIDE: B NATRIURETIC PEPTIDE 5: 41.4 pg/mL (ref 0.0–100.0)

## 2017-05-31 LAB — PHOSPHORUS: Phosphorus: 3.7 mg/dL (ref 2.5–4.6)

## 2017-05-31 NOTE — Consult Note (Signed)
Name: Paula Andrews MRN: [id number] DOB: [birth year]    ADMISSION DATE:  05/29/2017 CONSULTATION DATE:  2/28  REFERRING MD :  Alfredia Ferguson  CHIEF COMPLAINT:  Hypoxia   BRIEF PATIENT DESCRIPTION:  72 year old female patient with a significant history of hypertension, diabetes, thyroidism, and some degree of dementia.  Recently discharged after being hospitalized with a working diagnosis of dehydration and resultant hyponatremia, further complicated by thiazide diuretic.  She was treated with IV hydration and instructed to discontinue hydrochlorothiazide.  During that time it was noted she had significant sleep disturbance specifically loud snoring and operation was made for an outpatient sleep study (this did not get completed). He presented to the emergency room on 2/27 with approximately 3-day history of worsening confusion, increased lethargy, and desaturation as low as 67% when sleeping.  Apparently her husband is on oxygen at home, the patient's been using oxygen from time to time.  Initial chest x-ray in the emergency room showed clear chest x-ray without infiltrate.  CT was obtained, was negative for pulmonary embolus or acute pulmonary infiltrates, arterial blood gas on 2 L showed a pH of 7.398, PCO2 of 50.5, PO2 of 82.7.  She was admitted with a working diagnosis of acute encephalopathy, as well as what appears to be relatively chronic hypercapnia.  Treatment to date has included supplemental oxygen, bronchodilators, and CPAP therapy.  Pulmonary asked to see in evaluation in regards to her hypoxia.  SIGNIFICANT EVENTS    STUDIES:  CT chest 2/26IMPRESSION: 1. No acute intrathoracic pathology. No CT evidence of pulmonary embolism. 2. Mild cardiomegaly. 3. Aortic Atherosclerosis (ICD10-I70.0) and Emphysema (ICD10-J43.9). Echocardiogram 2/28>>>  HISTORY OF PRESENT ILLNESS: See above  PAST MEDICAL HISTORY :   has a past medical history of Dementia, Diabetes mellitus, Hypertension, and  Thyroid disease.  has a past surgical history that includes Abdominal hysterectomy. Prior to Admission medications   Medication Sig Start Date End Date Taking? Authorizing Provider  Ascorbic Acid (VITAMIN C) 1000 MG tablet Take 1,000 mg by mouth daily.   Yes [provider]  Calcium-Vitamin D 600-200 MG-UNIT per tablet Take 1 tablet by mouth daily.   Yes [provider]  levothyroxine (SYNTHROID, LEVOTHROID) 75 MCG tablet Take 75 mcg by mouth daily.    Yes [provider]  lisinopril (PRINIVIL,ZESTRIL) 5 MG tablet Take 5 mg by mouth daily.  05/26/17  Yes [provider]  Memantine HCl ER 21 MG CP24 Take 1 capsule by mouth daily.   Yes [provider]  metFORMIN (GLUCOPHAGE) 500 MG tablet Take 250 mg by mouth 2 (two) times daily with a meal.    Yes [provider]  metoprolol succinate (TOPROL-XL) 25 MG 24 hr tablet Take 25 mg by mouth every morning.   Yes [provider]  nitrofurantoin, macrocrystal-monohydrate, (MACROBID) 100 MG capsule Take 100 mg by mouth 2 (two) times daily.  05/26/17  Yes [provider]  ondansetron (ZOFRAN) 4 MG tablet Take 4 mg by mouth every 8 (eight) hours as needed for nausea or vomiting.  05/26/17  Yes [provider]  pravastatin (PRAVACHOL) 20 MG tablet Take 20 mg by mouth daily.    Yes [provider]  amLODipine (NORVASC) 5 MG tablet Take 1 tablet (5 mg total) by mouth daily. Patient not taking: Reported on 05/29/2017 05/19/17 06/18/17  Irwin Brakeman L, MD  lisinopril (PRINIVIL,ZESTRIL) 10 MG tablet Take 1 tablet (10 mg total) by mouth daily. Patient not taking: Reported on 05/29/2017 05/18/17 06/17/17  Wynetta Emery,  Clanford L, MD   No Known Allergies  FAMILY HISTORY:  family history includes Diabetes in her unknown relative. SOCIAL HISTORY:  reports that  has never smoked. she has never used smokeless tobacco. She reports that she does not drink alcohol or use drugs.  REVIEW  OF SYSTEMS:   Not able to obtain given dementia  SUBJECTIVE:  Feels fine His oxygen on VITAL SIGNS: Temp:  [98.5 F (36.9 C)-99.3 F (37.4 C)] 98.8 F (37.1 C) (02/28 0604) Pulse Rate:  [59-65] 59 (02/28 0604) Resp:  [16-18] 16 (02/28 0604) BP: (139-155)/(46-68) 155/68 (02/28 0604) SpO2:  [91 %-96 %] 96 % (02/28 0604) Weight:  [159 lb 9.8 oz (72.4 kg)] 159 lb 9.8 oz (72.4 kg) (02/27 1539)  PHYSICAL EXAMINATION: General: Pleasant slightly confused 72 year old white female sitting up in bed chatting with her husband Neuro: Awake alert oriented to place and time but easily confused HEENT: Normocephalic atraumatic mucous membranes moist no jugular venous distention Cardiovascular: Regular rate and rhythm Lungs: Clear without accessory use Abdomen: Soft nontender Musculoskeletal: Equal strength and bulk Skin: Warm and dry no significant edema  Recent Labs  Lab 05/29/17 1727 05/30/17 1037 05/31/17 0442  NA 138 136 139  K 4.0 4.1 3.9  CL 97* 96* 99*  CO2 30 31 31   BUN 10 8 12   CREATININE 0.70 0.74 0.81  GLUCOSE 92 170* 123*   Recent Labs  Lab 05/29/17 1727 05/30/17 1037 05/31/17 0442  HGB 13.9 13.4 12.9  HCT 43.4 41.7 40.3  WBC 8.1 6.8 7.7  PLT 300 267 301   ABG    Component Value Date/Time   PHART 7.398 05/30/2017 0013   PCO2ART 50.5 (H) 05/30/2017 0013   PO2ART 82.7 (L) 05/30/2017 0013   HCO3 30.7 (H) 05/30/2017 0013   TCO2 30.8 07/17/2011 1351   O2SAT 95.8 05/30/2017 0013   Dg Chest 2 View  Result Date: 05/29/2017 CLINICAL DATA:  Altered mental status. EXAM: CHEST  2 VIEW COMPARISON:  PA and lateral chest 05/16/2017. CT chest 07/15/2011. PA and lateral chest 07/12/2011. FINDINGS: There is cardiomegaly without edema. Aortic atherosclerosis is noted. No pneumothorax or pleural effusion. No acute bony abnormality. IMPRESSION: Cardiomegaly without acute disease. Atherosclerosis. Electronically Signed   By: Inge Rise M.D.   On: 05/29/2017 19:02   Ct Angio  Chest Pe W Or Wo Contrast  Result Date: 05/29/2017 CLINICAL DATA:  72 year old female with altered mental status. PE is suspected. EXAM: CT ANGIOGRAPHY CHEST WITH CONTRAST TECHNIQUE: Multidetector CT imaging of the chest was performed using the standard protocol during bolus administration of intravenous contrast. Multiplanar CT image reconstructions and MIPs were obtained to evaluate the vascular anatomy. CONTRAST:  128mL ISOVUE-370 IOPAMIDOL (ISOVUE-370) INJECTION 76% COMPARISON:  Chest radiograph dated 05/29/2017 FINDINGS: Cardiovascular: Borderline cardiomegaly. No pericardial effusion. Coronary vascular calcification. There is advanced calcified and noncalcified plaque of the thoracic aorta. No aneurysmal dilatation or evidence of dissection. No CT evidence of pulmonary embolism. Mediastinum/Nodes: No hilar or mediastinal adenopathy. The esophagus and the thyroid gland are grossly unremarkable. No mediastinal fluid collection. Lungs/Pleura: Mild centrilobular emphysema. No focal consolidation, pleural effusion, or pneumothorax. The central airways are patent. Upper Abdomen: 2 adjacent small hypodense lesions in the dome of the liver measure up to 9 mm are not characterized on this CT. The visualized upper abdomen is otherwise unremarkable. Musculoskeletal: Degenerative changes of the spine. No acute osseous pathology. Review of the MIP images confirms the above findings. IMPRESSION: 1. No acute intrathoracic pathology. No CT evidence of  pulmonary embolism. 2. Mild cardiomegaly. 3. Aortic Atherosclerosis (ICD10-I70.0) and Emphysema (ICD10-J43.9). Electronically Signed   By: Anner Crete M.D.   On: 05/29/2017 21:48    ASSESSMENT / PLAN:  Chronic hypoxic and hypercarbic respiratory failure Probable COPD Probable obstructive sleep apnea Acute delirium Dementia   Discussion: Her arterial blood gas evaluation demonstrates what appears to be chronic hypercarbic respiratory failure.  I do not think  her presentation is acute.  Her husband notes episodes of desaturation when she sleeps as low as 60%.  I suspect her acute delirium was indeed a consequence of both urinary tract infection and concomitant hypoxia.  Family tells me she is close to baseline currently, however room air saturations when ambulating drop into the 80% range.  I suspect she has some underlying COPD.  Also appears to have obstructive sleep apnea.  Echocardiogram will indeed be helpful to evaluate for evidence of cor pulmonale as well as screening for pulmonary artery hypertension this would further strengthen the argument that this is a chronic process  Plan/recommendations Check full pulmonary function testing Check a.m. arterial blood gas off from CPAP If has significant chronic hypercarbia as well as underlying obstructive airway disease [facility] may be the best option  Erick Colace ACNP-BC [place] Pager # [phone] OR # [phone] if no answer  05/31/2017, 12:20 PM

## 2017-05-31 NOTE — Progress Notes (Signed)
PROGRESS NOTE    Paula Andrews  LNL:[id number] DOB: November 17, 1945 DOA: 05/29/2017 PCP: Celene Squibb, MD  Brief Narrative:   Paula Andrews is a 72 y.o. female with medical history significant of HTN, DM type II, dementia, hypothyroidism; who presented with complaints of worsening confusion and shortness of breath. Just recently admitted into [facility] [place] from 2/13-2/15 after presenting with generalized weakness and found to have significant hyponatremia secondary to dehydration.  At discharge it appears patient was taken off of her lisinopril/hydrochlorothiazide. Patient was seen at urgent care 3 days ago and diagnosed with a urinary tract infection for which she has been on nitrofurantoin. Since being home family notes that the patient's been more confused, and was unable to recognize her husband at times.  She is more lethargic and reportedly has been sleeping much of the day.  Husband reported O2 saturations as low as 67% 2 days ago while the patient was sleeping.  She has been using her husband's oxygen due to O2 sat dropping and feeling short of breath.  She was scheduled to have a sleep study done at [facility] on the day of admission,  but presented to the hospital due to worsening symptoms. Admitted for Acute Respiratory Failure with Hypoxia and Hypercapnia and currently undergoing further workup and evaluation.  Patient had to be placed on CPAP last night because she desaturated significantly into the 70's. Psychiatry was consulted as there was some question SI/HI given grand-daughter's concern but she was cleared by Dr. Mariea Clonts and removed off of Safety and Suicide precautions. Pulmonary was consulted regarding her Hypoxia and are obtaining PFT's and an AM ABG off of CPAP.   Assessment & Plan:   Principal Problem:   Adjustment 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 Active Problems:   Acute respiratory failure with hypoxia and hypercapnia (HCC)   Acute lower  UTI   Essential hypertension   Diabetes mellitus type II, controlled ([place])   Hyperlipemia   UTI (urinary tract infection)  Acute Respiratory Failure with hypoxia and hypercapnia in a patient with a History of Emphysema and suspected Underlying COPD -Acute.  Patient presented with complaints of shortness of breath.   -Past medical history significant for remote history of tobacco abuse.   -O2 saturations noted to be as low as 84% on arrival on room air.   -ABG obtained showing normal pH 7.398 with PCO2 of 50.5 and PO2 of 82.7 reported on room air, but patient had been on 2 L of nasal cannula oxygen during most of her time in the ED. On differential included possibility of pulmonary edema, although chest x-ray appeared clear with mild cardiomegaly since patient recently stopped taking hydrochlorothiazide. -Admitted to a Telemetry bed  -Continuous pulse oximetry overnight with nasal cannula oxygen as needed -Maintain O2 Saturations >92%  -DuoNebs q6hprn sob/wheezing -Strict I&O's and Daily Weights -Checked Echocardiogram and showed EF of 65-70% and Grade 1 DD and  BNP still pending  -C/w IV Abx with Ceftriaxone; May escalate if not improving  -CT Scan of Chest to r/o PE showed No acute intrathoracic pathology. No CT evidence of pulmonary embolism, Mild cardiomegaly. Aortic Atherosclerosis and Mild Centrilobular Emphysema, and no focal consolidation, pleural effusion, or Pneumothorax   -Will add Flutter Valve, Incentive Spirometry, and add Guaifenesin 1200 mg po daily -Will do Ambulatory Home O2 Screen in AM and patient Desaturated into the 80's -Had to be placed on CPAP overnight and autotitrated -Obtaining Official Pulmonary Consultation  -Pulmonary obtaining  PFT's and checking AM ABG off of CPAP and if has significant chronic hypercarbia and underlying obstructive airway disease, they are recommending Trilogy  Acute Encephalopathy in a Patient with a History of Dementia, improving  -Patient  presented with worsening confusion.  Initial workup unremarkable.  -Worsening periods of hypercapnia and hypoxia likely cause of patient's increased lethargy and confusion. -Also question possibility of underlying infection although patient has been taking nitrofurantoin for UTI vs. recent medication changes vs other.   -Patient had been scheduled to have sleep study in [facility], but did not make appointment. -C/w Memantine 21 mg po Daily  -Delirium Precautions  -Recent Head CT 05/16/17 showed No acute intracranial process. 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  Moderate to severe chronic small vessel ischemic disease -Patient will benefit from rescheduling sleep study  Urinary Tract Infection  -Patient has been on nitrofurantoin for the last 4 days.  Urinalysis appears abnormal. -Checked Urine Culture and was Negative -Hold Nitrofurantoin -Started Empirical IV Ceftriaxone   Essential Hypertension -C/w Metoprolol 25 mg po Daily and Lisinopril 5 mg po Daily   Hypothyroidism -Last TSH noted to be 2.24 on 05/16/2017. -Continue Levothyroxine 75 mcg po Daily    Diabetes Mellitus Type 2 -Appears well controlled on home medications of metformin. -Hypoglycemic protocols -Hold Home Metformin   -C/w Sensitive Novolog SSI AC -CBG's ranging from 91-122  Hyperlipidemia -Continue Pravastatin 20 mg po qHS  Hypomagnesemia -Patient's Mag Level was 1.4 and improved to 2.2 -Replete with IV Mag Sulfate 3 grams yesterday -Continue to Monitor and Replete Mag in AM  -Repeat Mag Level in AM   ?SI/HI -Air cabin crew and Suicide Precautions utilized but now Discontinued. -No Active SI/HI/AVH -Consulted Psychiatry Dr. Mariea Clonts who cleared the patient -Recommending Outpatient Psychiatry and Therapy and recommending Melatonin 3-6 mg qHS to Regulate Sleep/Wake Cycle  -Does not warrant Inpatient Psychiatry Hospitalization at this time.   DVT prophylaxis: Enoxaparin 40 mg sq Daily  qHS Code Status: FULL CODE Family Communication: Discussed with husband, daughter, and granddaughter at bedside  Disposition Plan: If patient improved Anticipate D/C in 24-48 hours  Consultants:   Psychiatry  Pulmonary   Procedures: None  Antimicrobials:  Anti-infectives (From admission, onward)   Start     Dose/Rate Route Frequency Ordered Stop   05/30/17 2200  cefTRIAXone (ROCEPHIN) 2 g in sodium chloride 0.9 % 100 mL IVPB     2 g 200 mL/hr over 30 Minutes Intravenous Every 24 hours 05/30/17 0538     05/30/17 0100  cefTRIAXone (ROCEPHIN) 2 g in sodium chloride 0.9 % 100 mL IVPB     2 g 200 mL/hr over 30 Minutes Intravenous  Once 05/30/17 0057 05/30/17 0243     Subjective: Seen and examined and was improved. Wanting to speak with Psychiatry. No CP or SOB. Family feels like her mentation has improved. No lightheadedness or dizziness. Desaturated on Home O2 Screen.   Objective: Vitals:   05/30/17 1539 05/30/17 2126 05/31/17 0604 05/31/17 1325  BP: (!) 139/59 (!) 139/46 (!) 155/68 (!) 146/88  Pulse: 63 65 (!) 59 63  Resp: 18 16 16    Temp: 98.5 F (36.9 C) 99.3 F (37.4 C) 98.8 F (37.1 C) 98.8 F (37.1 C)  TempSrc: Oral Axillary Oral Oral  SpO2: 91% 95% 96% 97%  Weight: 72.4 kg (159 lb 9.8 oz)     Height: 5\' 5"  (1.651 m)       Intake/Output Summary (Last 24 hours) at 05/31/2017 1637 Last data filed at  05/31/2017 1413 Gross per 24 hour  Intake 1400 ml  Output -  Net 1400 ml   Filed Weights   05/30/17 1539  Weight: 72.4 kg (159 lb 9.8 oz)   Examination: Physical Exam:  Constitutional: WN/WD Caucasian female sitting bedside appears calm and comfortable in NAD Eyes: Sclerae anicteric. Lids normal ENMT: External Ears and Nose appear normal. Grossly normal hearing Neck: Supple with no JVD Respiratory: Diminished to auscultation bilaterally; No appreciable wheezing/rales/rhonchi or crackles.  Cardiovascular: RRR; No appreciable m/r/g. No appreciable LE  edema Abdomen: Soft, NT, ND. Bowel sounds present  GU: Deferred Musculoskeletal: No clubbing; No cyanosis. No contractures Skin: No appreciable rashes or lesions on a limited skin eval Neurologic: CN 2-12 grossly intact. No appreciable focal deficits Psychiatric: Awake and Alert and Oriented x2. Slightly anxious and confused.   Data Reviewed: I have personally reviewed following labs and imaging studies  CBC: Recent Labs  Lab 05/29/17 1727 05/30/17 1037 05/31/17 0442  WBC 8.1 6.8 7.7  NEUTROABS  --  5.1 4.9  HGB 13.9 13.4 12.9  HCT 43.4 41.7 40.3  MCV 95.8 97.0 97.1  PLT 300 267 166   Basic Metabolic Panel: Recent Labs  Lab 05/29/17 1727 05/30/17 1037 05/31/17 0442  NA 138 136 139  K 4.0 4.1 3.9  CL 97* 96* 99*  CO2 30 31 31   GLUCOSE 92 170* 123*  BUN 10 8 12   CREATININE 0.70 0.74 0.81  CALCIUM 9.5 9.0 9.1  MG  --  1.4* 2.2  PHOS  --  4.2 3.7   GFR: Estimated Creatinine Clearance: 63.6 mL/min (by C-G formula based on SCr of 0.81 mg/dL). Liver Function Tests: Recent Labs  Lab 05/29/17 1727 05/30/17 1037 05/31/17 0442  AST 21 20 17   ALT 16 14 12*  ALKPHOS 60 60 56  BILITOT 0.7 0.6 0.5  PROT 7.2 6.3* 6.2*  ALBUMIN 4.6 3.8 3.9   No results for input(s): LIPASE, AMYLASE in the last 168 hours. No results for input(s): AMMONIA in the last 168 hours. Coagulation Profile: No results for input(s): INR, PROTIME in the last 168 hours. Cardiac Enzymes: No results for input(s): CKTOTAL, CKMB, CKMBINDEX, TROPONINI in the last 168 hours. BNP (last 3 results) No results for input(s): PROBNP in the last 8760 hours. HbA1C: No results for input(s): HGBA1C in the last 72 hours. CBG: Recent Labs  Lab 05/30/17 1636 05/30/17 2125 05/31/17 0723 05/31/17 1143 05/31/17 1619  GLUCAP 80 98 88 101* 91   Lipid Profile: No results for input(s): CHOL, HDL, LDLCALC, TRIG, CHOLHDL, LDLDIRECT in the last 72 hours. Thyroid Function Tests: No results for input(s): TSH,  T4TOTAL, FREET4, T3FREE, THYROIDAB in the last 72 hours. Anemia Panel: No results for input(s): VITAMINB12, FOLATE, FERRITIN, TIBC, IRON, RETICCTPCT in the last 72 hours. Sepsis Labs: No results for input(s): PROCALCITON, LATICACIDVEN in the last 168 hours.  Recent Results (from the past 240 hour(s))  Urine Culture     Status: None   Collection Time: 05/29/17  5:28 PM  Result Value Ref Range Status   Specimen Description   Final    URINE, CLEAN CATCH Performed at [facility], [place] [address], [place], [place] [address]    Special Requests   Final    NONE Performed at [facility], [place] [address], [place], [place] [address]    Culture   Final    NO GROWTH Performed at [facility], [place] [address], [place], [place] [address]  Report Status 05/31/2017 FINAL  Final    Radiology Studies: Dg Chest 2 View  Result Date: 05/29/2017 CLINICAL DATA:  Altered mental status. EXAM: CHEST  2 VIEW COMPARISON:  PA and lateral chest 05/16/2017. CT chest 07/15/2011. PA and lateral chest 07/12/2011. FINDINGS: There is cardiomegaly without edema. Aortic atherosclerosis is noted. No pneumothorax or pleural effusion. No acute bony abnormality. IMPRESSION: Cardiomegaly without acute disease. Atherosclerosis. Electronically Signed   By: Inge Rise M.D.   On: 05/29/2017 19:02   Ct Angio Chest Pe W Or Wo Contrast  Result Date: 05/29/2017 CLINICAL DATA:  72 year old female with altered mental status. PE is suspected. EXAM: CT ANGIOGRAPHY CHEST WITH CONTRAST TECHNIQUE: Multidetector CT imaging of the chest was performed using the standard protocol during bolus administration of intravenous contrast. Multiplanar CT image reconstructions and MIPs were obtained to evaluate the vascular anatomy. CONTRAST:  162mL ISOVUE-370 IOPAMIDOL (ISOVUE-370) INJECTION 76% COMPARISON:  Chest radiograph dated 05/29/2017 FINDINGS: Cardiovascular: Borderline cardiomegaly. No  pericardial effusion. Coronary vascular calcification. There is advanced calcified and noncalcified plaque of the thoracic aorta. No aneurysmal dilatation or evidence of dissection. No CT evidence of pulmonary embolism. Mediastinum/Nodes: No hilar or mediastinal adenopathy. The esophagus and the thyroid gland are grossly unremarkable. No mediastinal fluid collection. Lungs/Pleura: Mild centrilobular emphysema. No focal consolidation, pleural effusion, or pneumothorax. The central airways are patent. Upper Abdomen: 2 adjacent small hypodense lesions in the dome of the liver measure up to 9 mm are not characterized on this CT. The visualized upper abdomen is otherwise unremarkable. Musculoskeletal: Degenerative changes of the spine. No acute osseous pathology. Review of the MIP images confirms the above findings. IMPRESSION: 1. No acute intrathoracic pathology. No CT evidence of pulmonary embolism. 2. Mild cardiomegaly. 3. Aortic Atherosclerosis (ICD10-I70.0) and Emphysema (ICD10-J43.9). Electronically Signed   By: Anner Crete M.D.   On: 05/29/2017 21:48   Scheduled Meds: . enoxaparin (LOVENOX) injection  40 mg Subcutaneous QHS  . guaiFENesin  1,200 mg Oral BID  . insulin aspart  0-9 Units Subcutaneous TID WC  . levothyroxine  75 mcg Oral QAC breakfast  . lisinopril  5 mg Oral Daily  . memantine  21 mg Oral Daily  . metoprolol succinate  25 mg Oral q morning - 10a  . pravastatin  20 mg Oral q1800   Continuous Infusions: . cefTRIAXone (ROCEPHIN)  IV Stopped (05/31/17 0126)    LOS: 1 day   Kerney Elbe, DO [facility] Pager [phone]  If 7PM-7AM, please contact night-coverage [email or web address] Password [facility] 05/31/2017, 4:37 PM

## 2017-05-31 NOTE — Progress Notes (Signed)
Received pt in stable condition. No signs symptoms of distress. Agree with previous RN assessment 

## 2017-05-31 NOTE — Progress Notes (Signed)
Pt. placed on CPAP in auto mode starting with Inspiratory pressure of 4 with max set at 14, oxygen placed in circuit at 2 lpm-sats 93% on bedside continuous pulse oximeter, NT remains at bedside, RN made aware.

## 2017-05-31 NOTE — Progress Notes (Signed)
NT found pt O2 stats to be 72 upon assessment of vitals and notified RN. This RN placed the pt on 2L nasal cannula and O2 stats quickly improved to 92. Pt remained lethargic and unable to open eyes. This RN called rapid response and paged the NP on call. See note by NP K. Baltazar Najjar. No new orders at this time. Will continue to monitor patient.

## 2017-05-31 NOTE — Progress Notes (Signed)
  Echocardiogram 2D Echocardiogram has been performed.  Paula Andrews 05/31/2017, 9:24 AM

## 2017-05-31 NOTE — Consult Note (Addendum)
[facility] Consult   Reason for Consult:  SI and HI Referring Physician:  Dr. Alfredia Ferguson Patient Identification: BOWEN GOYAL MRN:  [id number] Principal Diagnosis: Adjustment 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 Diagnosis:   Patient Active Problem List   Diagnosis Date Noted  . Acute respiratory failure with hypoxia and hypercapnia (HCC) [J96.01, J96.02] 05/30/2017  . Acute lower UTI [N39.0] 05/30/2017  . Essential hypertension [I10] 05/30/2017  . Diabetes mellitus type II, controlled ([place]) [E11.9] 05/30/2017  . Hyperlipemia [E78.5] 05/30/2017  . UTI (urinary tract infection) [N39.0] 05/30/2017  . Hypoxia [R09.02] 05/29/2017  . Hyponatremia [E87.1] 05/16/2017  . Weakness [R53.1]   . Fracture of radial neck, left, closed [S52.132A] 01/29/2012  . CLOSED FRACTURE UNSPEC PART UPPER END HUMERUS [S42.209A] 04/13/2010    Total Time spent with patient: 1 hour  Subjective:   AREYA LEMMERMAN is a 72 y.o. female patient admitted with acute respiratory failure with hypoxia and hypercapnia.  HPI:   Per chart review, patient endorsed SI and HI to family although she currently denies SI/HI. She was recently admitted to the hospital (2/13-2/15) for hyponatremia secondary to dehydration. Lisinopril/HTZ was discontinued. She was diagnosed with a UTI 3 days later and was started on Nitrofurantoin. She has been reportedly more confused at home and unable to recognize her husband at times. She has been lethargic and sleeping most of the day. Her O2 saturations were noted to be as low at 67% at home. She is prescribed Namenda 21 mg daily for dementia.   On interview, Ms. Peraza reports that she has been in and out of the hospital. This has caused her frustration. She reports that she has been changing her diet to eat less sodium but she was recently diagnosed with hyponatremia. She reports that she was told that she has swelling on her brain during her last hospitalization.  She has been worrying about her medical conditions and this has caused her to have headaches. She reports depressed mood for the past 2 weeks. She denies a prior history of depression or anxiety. She denies neurovegetative symptoms. She reports problems with sleep or appetite. She enjoys walking and fishing. She denies current SI, HI or AVH. She admits to telling her family that she "could not live like this" if her medical conditions continued to worsen. She denies any intention to harm self and denies a prior history of suicide attempts. She was provided reassurance that her current medical problems are treatable.   Patient provided permission to speak to her husband, [name] who was present at the hospital. She was diagnosed with dementia a year ago. She is disoriented and more confused in the mornings. She plans to have a sleep study completed. She has short term memory deficits and often goes backs to memories several years earlier. This has caused confusion since she believes her husband should be younger in appearance at times. She has recently been hallucinating since onset of current medical conditions. She becomes irritable when asked to complete housework. Her husband feels like she is safe to return home. She does not have access to weapons and he has securely locked up his guns.   Past Psychiatric History: Dementia    Risk to Self: None. Denies SI. Risk to Others:  None. Denies HI.  Prior Inpatient Therapy:  Denies  Prior Outpatient Therapy:  Denies   Past Medical History:  Past Medical History:  Diagnosis Date  . Dementia   . Diabetes mellitus   .  Hypertension   . Thyroid disease     Past Surgical History:  Procedure Laterality Date  . ABDOMINAL HYSTERECTOMY     Family History:  Family History  Problem Relation Age of Onset  . Diabetes Unknown    Family Psychiatric  History: Mother-dementia and daughter-history of multiple suicide attempts.   Social History:  Social History    Substance and Sexual Activity  Alcohol Use No     Social History   Substance and Sexual Activity  Drug Use No    Social History   Socioeconomic History  . Marital status: Married    Spouse name: None  . Number of children: None  . Years of education: 13  . Highest education level: None  Social Needs  . Financial resource strain: None  . Food insecurity - worry: None  . Food insecurity - inability: None  . Transportation needs - medical: None  . Transportation needs - non-medical: None  Occupational History    Employer: RETIRED  Tobacco Use  . Smoking status: Never Smoker  . Smokeless tobacco: Never Used  Substance and Sexual Activity  . Alcohol use: No  . Drug use: No  . Sexual activity: None  Other Topics Concern  . None  Social History Narrative  . None   Additional Social History: She lives at home with her husband of 59 years and dog. She has 5 children and 8 grandchildren. She was a Regulatory affairs officer but retired in 1970. She denies illicit substance or alcohol use.      Allergies:  No Known Allergies  Labs:  Results for orders placed or performed during the hospital encounter of 05/29/17 (from the past 48 hour(s))  Comprehensive metabolic panel     Status: Abnormal   Collection Time: 05/29/17  5:27 PM  Result Value Ref Range   Sodium 138 135 - 145 mmol/L   Potassium 4.0 3.5 - 5.1 mmol/L   Chloride 97 (L) 101 - 111 mmol/L   CO2 30 22 - 32 mmol/L   Glucose, Bld 92 65 - 99 mg/dL   BUN 10 6 - 20 mg/dL   Creatinine, Ser 0.70 0.44 - 1.00 mg/dL   Calcium 9.5 8.9 - 10.3 mg/dL   Total Protein 7.2 6.5 - 8.1 g/dL   Albumin 4.6 3.5 - 5.0 g/dL   AST 21 15 - 41 U/L   ALT 16 14 - 54 U/L   Alkaline Phosphatase 60 38 - 126 U/L   Total Bilirubin 0.7 0.3 - 1.2 mg/dL   GFR calc non Af Amer >60 >60 mL/min   GFR calc Af Amer >60 >60 mL/min    Comment: (NOTE) The eGFR has been calculated using the CKD EPI equation. This calculation has not been validated in all clinical  situations. eGFR's persistently <60 mL/min signify possible Chronic Kidney Disease.    Anion gap 11 5 - 15    Comment: Performed at [facility], [place] [address] [place], [place] [address]  Ethanol     Status: None   Collection Time: 05/29/17  5:27 PM  Result Value Ref Range   Alcohol, Ethyl (B) <10 <10 mg/dL    Comment:        LOWEST DETECTABLE LIMIT FOR SERUM ALCOHOL IS 10 mg/dL FOR MEDICAL PURPOSES ONLY Performed at [place] [address], [place], [place] [address]   Salicylate level     Status: None   Collection Time: 05/29/17  5:27 PM  Result Value Ref Range  Salicylate Lvl <8.6 2.8 - 30.0 mg/dL    Comment: Performed at [facility], [place] [address], [place], [place] [address]  Acetaminophen level     Status: Abnormal   Collection Time: 05/29/17  5:27 PM  Result Value Ref Range   Acetaminophen (Tylenol), Serum <10 (L) 10 - 30 ug/mL    Comment:        THERAPEUTIC CONCENTRATIONS VARY SIGNIFICANTLY. A RANGE OF 10-30 ug/mL MAY BE AN EFFECTIVE CONCENTRATION FOR MANY PATIENTS. HOWEVER, SOME ARE BEST TREATED AT CONCENTRATIONS OUTSIDE THIS RANGE. ACETAMINOPHEN CONCENTRATIONS >150 ug/mL AT 4 HOURS AFTER INGESTION AND >50 ug/mL AT 12 HOURS AFTER INGESTION ARE OFTEN ASSOCIATED WITH TOXIC REACTIONS. Performed at [facility], [place] [address], [place], [place] [address]   cbc     Status: None   Collection Time: 05/29/17  5:27 PM  Result Value Ref Range   WBC 8.1 4.0 - 10.5 K/uL   RBC 4.53 3.87 - 5.11 MIL/uL   Hemoglobin 13.9 12.0 - 15.0 g/dL   HCT 43.4 36.0 - 46.0 %   MCV 95.8 78.0 - 100.0 fL   MCH 30.7 26.0 - 34.0 pg   MCHC 32.0 30.0 - 36.0 g/dL   RDW 13.0 11.5 - 15.5 %   Platelets 300 150 - 400 K/uL    Comment: Performed at [facility], [place] [address], [place], [place] [address]  Rapid urine drug screen (hospital performed)     Status: None   Collection Time: 05/29/17   5:28 PM  Result Value Ref Range   Opiates NONE DETECTED NONE DETECTED   Cocaine NONE DETECTED NONE DETECTED   Benzodiazepines NONE DETECTED NONE DETECTED   Amphetamines NONE DETECTED NONE DETECTED   Tetrahydrocannabinol NONE DETECTED NONE DETECTED   Barbiturates NONE DETECTED NONE DETECTED    Comment: (NOTE) DRUG SCREEN FOR MEDICAL PURPOSES ONLY.  IF CONFIRMATION IS NEEDED FOR ANY PURPOSE, NOTIFY LAB WITHIN 5 DAYS. LOWEST DETECTABLE LIMITS FOR URINE DRUG SCREEN Drug Class                     Cutoff (ng/mL) Amphetamine and metabolites    1000 Barbiturate and metabolites    200 Benzodiazepine                 244 Tricyclics and metabolites     300 Opiates and metabolites        300 Cocaine and metabolites        300 THC                            50 Performed at [facility], [place] [address] [place], [place] [address]   Urinalysis, Routine w reflex microscopic     Status: Abnormal   Collection Time: 05/29/17  5:28 PM  Result Value Ref Range   Color, Urine YELLOW YELLOW   APPearance CLEAR CLEAR   Specific Gravity, Urine 1.014 1.005 - 1.030   pH 6.0 5.0 - 8.0   Glucose, UA NEGATIVE NEGATIVE mg/dL   Hgb urine dipstick NEGATIVE NEGATIVE   Bilirubin Urine NEGATIVE NEGATIVE   Ketones, ur 5 (A) NEGATIVE mg/dL   Protein, ur NEGATIVE NEGATIVE mg/dL   Nitrite NEGATIVE NEGATIVE   Leukocytes, UA SMALL (A) NEGATIVE   RBC / HPF 0-5 0 - 5 RBC/hpf   WBC, UA 6-30 0 - 5 WBC/hpf   Bacteria, UA RARE (A) NONE SEEN   Squamous  Epithelial / LPF 0-5 (A) NONE SEEN    Comment: Performed at [facility], [place] [address], [place], [place] [address]  Urine Culture     Status: None   Collection Time: 05/29/17  5:28 PM  Result Value Ref Range   Specimen Description      URINE, CLEAN CATCH Performed at [facility], [place] [address], [place], [place] [address]    Special Requests      NONE Performed at [facility], [place]  [address] [place], [place] [address]    Culture      NO GROWTH Performed at [facility], [place] [address], [place], [place] [address]    Report Status 05/31/2017 FINAL   CBG monitoring, ED     Status: None   Collection Time: 05/29/17  5:32 PM  Result Value Ref Range   Glucose-Capillary 89 65 - 99 mg/dL  CBG monitoring, ED     Status: Abnormal   Collection Time: 05/30/17 12:04 AM  Result Value Ref Range   Glucose-Capillary 128 (H) 65 - 99 mg/dL   Comment 1 Notify RN    Comment 2 Document in Chart   Blood gas, arterial ([facility] & [facility] ONLY)     Status: Abnormal   Collection Time: 05/30/17 12:13 AM  Result Value Ref Range   FIO2 21.00    pH, Arterial 7.398 7.350 - 7.450   pCO2 arterial 50.5 (H) 32.0 - 48.0 mmHg   pO2, Arterial 82.7 (L) 83.0 - 108.0 mmHg   Bicarbonate 30.7 (H) 20.0 - 28.0 mmol/L   Acid-Base Excess 5.0 (H) 0.0 - 2.0 mmol/L   O2 Saturation 95.8 %   Patient temperature 97.7    Collection site RIGHT RADIAL    Drawn by [phone]    Sample type ARTERIAL DRAW    Allens test (pass/fail) PASS PASS    Comment: Performed at [facility], [place] [address], [place], [place] [address]  CBG monitoring, ED     Status: Abnormal   Collection Time: 05/30/17  9:19 AM  Result Value Ref Range   Glucose-Capillary 114 (H) 65 - 99 mg/dL   Comment 1 Notify RN   CBC with Differential/Platelet     Status: None   Collection Time: 05/30/17 10:37 AM  Result Value Ref Range   WBC 6.8 4.0 - 10.5 K/uL   RBC 4.30 3.87 - 5.11 MIL/uL   Hemoglobin 13.4 12.0 - 15.0 g/dL   HCT 41.7 36.0 - 46.0 %   MCV 97.0 78.0 - 100.0 fL   MCH 31.2 26.0 - 34.0 pg   MCHC 32.1 30.0 - 36.0 g/dL   RDW 13.1 11.5 - 15.5 %   Platelets 267 150 - 400 K/uL   Neutrophils Relative % 75 %   Neutro Abs 5.1 1.7 - 7.7 K/uL   Lymphocytes Relative 18 %   Lymphs Abs 1.2 0.7 - 4.0 K/uL   Monocytes Relative 5 %   Monocytes Absolute 0.4 0.1 - 1.0 K/uL   Eosinophils Relative 1 %   Eosinophils Absolute 0.1 0.0 - 0.7  K/uL   Basophils Relative 1 %   Basophils Absolute 0.1 0.0 - 0.1 K/uL    Comment: Performed at [facility], [place] [address], [place],  [address]  Comprehensive metabolic panel     Status: Abnormal   Collection Time: 05/30/17 10:37 AM  Result Value Ref Range   Sodium 136 135 - 145 mmol/L   Potassium 4.1  3.5 - 5.1 mmol/L   Chloride 96 (L) 101 - 111 mmol/L   CO2 31 22 - 32 mmol/L   Glucose, Bld 170 (H) 65 - 99 mg/dL   BUN 8 6 - 20 mg/dL   Creatinine, Ser 0.74 0.44 - 1.00 mg/dL   Calcium 9.0 8.9 - 10.3 mg/dL   Total Protein 6.3 (L) 6.5 - 8.1 g/dL   Albumin 3.8 3.5 - 5.0 g/dL   AST 20 15 - 41 U/L   ALT 14 14 - 54 U/L   Alkaline Phosphatase 60 38 - 126 U/L   Total Bilirubin 0.6 0.3 - 1.2 mg/dL   GFR calc non Af Amer >60 >60 mL/min   GFR calc Af Amer >60 >60 mL/min    Comment: (NOTE) The eGFR has been calculated using the CKD EPI equation. This calculation has not been validated in all clinical situations. eGFR's persistently <60 mL/min signify possible Chronic Kidney Disease.    Anion gap 9 5 - 15    Comment: Performed at [facility], [place] [address], [place], [place] [address]  Magnesium     Status: Abnormal   Collection Time: 05/30/17 10:37 AM  Result Value Ref Range   Magnesium 1.4 (L) 1.7 - 2.4 mg/dL    Comment: Performed at [facility], [place] [address] [place], [place] [address]  Phosphorus     Status: None   Collection Time: 05/30/17 10:37 AM  Result Value Ref Range   Phosphorus 4.2 2.5 - 4.6 mg/dL    Comment: Performed at [facility], [place] [address] [place], [place] [address]  CBG monitoring, ED     Status: Abnormal   Collection Time: 05/30/17 11:46 AM  Result Value Ref Range   Glucose-Capillary 122 (H) 65 - 99 mg/dL   Comment 1 Notify RN   Glucose, capillary     Status: None   Collection Time: 05/30/17  4:36 PM  Result Value Ref Range   Glucose-Capillary 80 65 - 99 mg/dL  Glucose,  capillary     Status: None   Collection Time: 05/30/17  9:25 PM  Result Value Ref Range   Glucose-Capillary 98 65 - 99 mg/dL  CBC with Differential/Platelet     Status: None   Collection Time: 05/31/17  4:42 AM  Result Value Ref Range   WBC 7.7 4.0 - 10.5 K/uL   RBC 4.15 3.87 - 5.11 MIL/uL   Hemoglobin 12.9 12.0 - 15.0 g/dL   HCT 40.3 36.0 - 46.0 %   MCV 97.1 78.0 - 100.0 fL   MCH 31.1 26.0 - 34.0 pg   MCHC 32.0 30.0 - 36.0 g/dL   RDW 13.2 11.5 - 15.5 %   Platelets 301 150 - 400 K/uL   Neutrophils Relative % 63 %   Neutro Abs 4.9 1.7 - 7.7 K/uL   Lymphocytes Relative 26 %   Lymphs Abs 2.0 0.7 - 4.0 K/uL   Monocytes Relative 8 %   Monocytes Absolute 0.6 0.1 - 1.0 K/uL   Eosinophils Relative 2 %   Eosinophils Absolute 0.2 0.0 - 0.7 K/uL   Basophils Relative 1 %   Basophils Absolute 0.1 0.0 - 0.1 K/uL    Comment: Performed at [facility], [place] [address] [place], [place] [address]  Comprehensive metabolic panel     Status: Abnormal   Collection Time: 05/31/17  4:42 AM  Result Value Ref Range   Sodium 139 135 - 145 mmol/L   Potassium 3.9  3.5 - 5.1 mmol/L   Chloride 99 (L) 101 - 111 mmol/L   CO2 31 22 - 32 mmol/L   Glucose, Bld 123 (H) 65 - 99 mg/dL   BUN 12 6 - 20 mg/dL   Creatinine, Ser 0.81 0.44 - 1.00 mg/dL   Calcium 9.1 8.9 - 10.3 mg/dL   Total Protein 6.2 (L) 6.5 - 8.1 g/dL   Albumin 3.9 3.5 - 5.0 g/dL   AST 17 15 - 41 U/L   ALT 12 (L) 14 - 54 U/L   Alkaline Phosphatase 56 38 - 126 U/L   Total Bilirubin 0.5 0.3 - 1.2 mg/dL   GFR calc non Af Amer >60 >60 mL/min   GFR calc Af Amer >60 >60 mL/min    Comment: (NOTE) The eGFR has been calculated using the CKD EPI equation. This calculation has not been validated in all clinical situations. eGFR's persistently <60 mL/min signify possible Chronic Kidney Disease.    Anion gap 9 5 - 15    Comment: Performed at [facility], [place] [address], [place], [place] [address]  Magnesium      Status: None   Collection Time: 05/31/17  4:42 AM  Result Value Ref Range   Magnesium 2.2 1.7 - 2.4 mg/dL    Comment: Performed at [facility], [place] [address], [place], [place] [address]  Phosphorus     Status: None   Collection Time: 05/31/17  4:42 AM  Result Value Ref Range   Phosphorus 3.7 2.5 - 4.6 mg/dL    Comment: Performed at [facility], [place] [address] [place], [place] [address]  Glucose, capillary     Status: None   Collection Time: 05/31/17  7:23 AM  Result Value Ref Range   Glucose-Capillary 88 65 - 99 mg/dL    Current Facility-Administered Medications  Medication Dose Route Frequency Provider Last Rate Last Dose  . acetaminophen (TYLENOL) tablet 650 mg  650 mg Oral Q6H PRN Fuller Plan A, MD       Or  . acetaminophen (TYLENOL) suppository 650 mg  650 mg Rectal Q6H PRN Tamala Julian, Rondell A, MD      . cefTRIAXone (ROCEPHIN) 2 g in sodium chloride 0.9 % 100 mL IVPB  2 g Intravenous Q24H Norval Morton, MD   Stopped at 05/31/17 0126  . enoxaparin (LOVENOX) injection 40 mg  40 mg Subcutaneous QHS Smith, Rondell A, MD   40 mg at 05/30/17 2251  . guaiFENesin (MUCINEX) 12 hr tablet 1,200 mg  1,200 mg Oral BID Raiford Noble Bandera, DO   1,200 mg at 05/31/17 2426  . insulin aspart (novoLOG) injection 0-9 Units  0-9 Units Subcutaneous TID WC Fuller Plan A, MD   1 Units at 05/30/17 1235  . ipratropium-albuterol (DUONEB) 0.5-2.5 (3) MG/3ML nebulizer solution 3 mL  3 mL Nebulization Q6H PRN Smith, Rondell A, MD      . levothyroxine (SYNTHROID, LEVOTHROID) tablet 75 mcg  75 mcg Oral QAC breakfast Fuller Plan A, MD   75 mcg at 05/31/17 0835  . lisinopril (PRINIVIL,ZESTRIL) tablet 5 mg  5 mg Oral Daily Fuller Plan A, MD   5 mg at 05/31/17 0835  . memantine (NAMENDA XR) 24 hr capsule 21 mg  21 mg Oral Daily Fuller Plan A, MD   21 mg at 05/31/17 0834  . metoprolol succinate (TOPROL-XL) 24 hr tablet 25 mg  25 mg Oral q morning - 10a Norval Morton, MD   25 mg  at 05/31/17 0835  . ondansetron (ZOFRAN) tablet 4 mg  4 mg Oral Q6H PRN Fuller Plan A, MD       Or  . ondansetron (ZOFRAN) injection 4 mg  4 mg Intravenous Q6H PRN Smith, Rondell A, MD      . pravastatin (PRAVACHOL) tablet 20 mg  20 mg Oral q1800 Fuller Plan A, MD   20 mg at 05/30/17 1757    Musculoskeletal: Strength & Muscle Tone: within normal limits Gait & Station: normal Patient leans: N/A  Psychiatric Specialty Exam: Physical Exam  Nursing note and vitals reviewed. Constitutional: She appears well-developed and well-nourished.  HENT:  Head: Normocephalic and atraumatic.  Neck: Normal range of motion.  Respiratory: Effort normal.  Musculoskeletal: Normal range of motion.  Neurological: She is alert.  Oriented to person and place.  Skin: No rash noted.  Psychiatric: She has a normal mood and affect. Her speech is normal and behavior is normal. Thought content normal. Cognition and memory are impaired. She expresses impulsivity.    Review of Systems  Cardiovascular: Negative for chest pain.  Gastrointestinal: Negative for abdominal pain, constipation, diarrhea, nausea and vomiting.  Musculoskeletal:       Right underarm pain.   Psychiatric/Behavioral: Positive for depression. Negative for hallucinations, substance abuse and suicidal ideas. The patient is nervous/anxious. The patient does not have insomnia.   All other systems reviewed and are negative.   Blood pressure (!) 155/68, pulse (!) 59, temperature 98.8 F (37.1 C), temperature source Oral, resp. rate 16, height 5' 5"  (1.651 m), weight 72.4 kg (159 lb 9.8 oz), SpO2 96 %.Body mass index is 26.56 kg/m.  General Appearance: Well Groomed, elderly, Caucasian female who is wearing a hospital gown and sitting on a bench. NAD.   Eye Contact:  Good  Speech:  Clear and Coherent and Normal Rate  Volume:  Normal  Mood:  Anxious  Affect:  Appropriate and Full Range  Thought Process:  Goal Directed,  Linear and Descriptions of Associations: Intact  Orientation:  Full (Time, Place, and Person)  Thought Content:  Logical  Suicidal Thoughts:  No  Homicidal Thoughts:  No  Memory:  Immediate;   Poor Recent;   Fair Remote;   Fair  Judgement:  Fair  Insight:  Fair  Psychomotor Activity:  Normal  Concentration:  Concentration: Good and Attention Span: Good  Recall:  [employer] of Knowledge:  Fair  Language:  Fair  Akathisia:  No  Handed:  Right  AIMS (if indicated):     Assets:  Communication Skills Housing Intimacy Social Support  ADL's:  Intact  Cognition:  Impaired with short term memory deficits.  Sleep:   Okay   Assessment:  BRIANNON BOGGIO is a 72 y.o. female who was admitted with acute respiratory failure with hypoxia and hypercapnia. She reports depressed mood for the past 2 weeks in the setting of medical problems. She admits to endorsing thoughts that she would not want to live to her family if her medical conditions worsen. She denies current SI, HI or AVH. She is able to safety plan and does not have access to weapons at home. She declines medications for mood at this time but will accept resources. Her husband feels that she is safe at home and is able to monitor her 24/7. She does not warrant inpatient psychiatric hospitalization at this time.   Treatment Plan Summary: -Start Melatonin 3-6 mg qhs to regulate sleep/wake cycle.  -Recommend unit SW providing patient with resources  for psychiatrist and therapist.  -Recommend patient following up for sleep study as previously planned since significant oxygen desaturations can cause mood and behavioral changes as well as worsening cognitive deficits.  -Patient declines medication management for mood at this time.  -Psychiatry will sign off on patient at this time. Please consult psychiatry again as needed.  Disposition: No evidence of imminent risk to self or others at present.   Patient does not meet criteria for psychiatric  inpatient admission.  Faythe Dingwall, DO 05/31/2017 10:05 AM

## 2017-05-31 NOTE — Progress Notes (Signed)
OT Cancellation Note  Patient Details Name: CLAUDIE BRICKHOUSE MRN: [id number] DOB: 11-26-1945   Cancelled Treatment:    Reason Eval/Treat Not Completed: PT screened, no needs identified, will sign off.  PT spoke to sitter and pt has been independent caring for herself.  Fabiano Ginley 05/31/2017, 2:16 PM  Lesle Chris, OTR/L [phone] 05/31/2017

## 2017-05-31 NOTE — Progress Notes (Signed)
RT note: Made aware by staff pt. was placed back on nasal cannula after ~ 1 hour on CPAP.

## 2017-05-31 NOTE — Progress Notes (Addendum)
RT called by RN notifying that pt. remaining to desaturate at times this evening and has PRN CPAP order placed from earlier episode when she was seen by NP Kirby-Graham, has sitter in room, pt. assessed by RT and was informed that she had scheduled Sleep study which was delayed due to current hospital admission along with being informed by pt. that as child had enlarged Adenoids which could possibly explain current desaturation issues. Pt. agreed to trial wear CPAP but wanted to eat something first so notified NT/sitter in room, along with RN that that would delay CPAP being placed on for X1 hour due to Aspiration Precautions,, RT will plan on placing CPAP ~0230. Pt. noted upon further assessment ABG done on room air showing compensated pC02 retainer levels-Hypercarbia.

## 2017-05-31 NOTE — Progress Notes (Signed)
PT Cancellation Note  Patient Details Name: Paula Andrews MRN: [id number] DOB: 28-Nov-1945   Cancelled Treatment:    Reason Eval/Treat Not Completed: PT screened, no needs identified, will sign off(amb with family and NT, no issues except O2)   Jennika Ringgold 05/31/2017, 11:36 AM

## 2017-05-31 NOTE — Progress Notes (Signed)
Psych assessed pt. Updated MD Alfredia Ferguson. MD with verbal order to DC suicide sitter and precautions.

## 2017-06-01 ENCOUNTER — Inpatient Hospital Stay (HOSPITAL_COMMUNITY): Payer: Medicare Other

## 2017-06-01 DIAGNOSIS — R0602 Shortness of breath: Secondary | ICD-10-CM

## 2017-06-01 LAB — CBC WITH DIFFERENTIAL/PLATELET
BASOS ABS: 0.1 10*3/uL (ref 0.0–0.1)
BASOS PCT: 1 %
EOS ABS: 0.2 10*3/uL (ref 0.0–0.7)
Eosinophils Relative: 2 %
HCT: 42.8 % (ref 36.0–46.0)
HEMOGLOBIN: 13.5 g/dL (ref 12.0–15.0)
Lymphocytes Relative: 16 %
Lymphs Abs: 1.3 10*3/uL (ref 0.7–4.0)
MCH: 31 pg (ref 26.0–34.0)
MCHC: 31.5 g/dL (ref 30.0–36.0)
MCV: 98.4 fL (ref 78.0–100.0)
MONOS PCT: 8 %
Monocytes Absolute: 0.7 10*3/uL (ref 0.1–1.0)
NEUTROS PCT: 73 %
Neutro Abs: 6.2 10*3/uL (ref 1.7–7.7)
Platelets: 256 10*3/uL (ref 150–400)
RBC: 4.35 MIL/uL (ref 3.87–5.11)
RDW: 13.1 % (ref 11.5–15.5)
WBC: 8.5 10*3/uL (ref 4.0–10.5)

## 2017-06-01 LAB — PULMONARY FUNCTION TEST
DL/VA % pred: 53 %
DL/VA: 2.44 ml/min/mmHg/L
DLCO COR % PRED: 43 %
DLCO COR: 9.4 ml/min/mmHg
DLCO UNC: 9.43 ml/min/mmHg
DLCO unc % pred: 43 %
FEF 25-75 Post: 1.42 L/sec
FEF 25-75 Pre: 1.23 L/sec
FEF2575-%Change-Post: 15 %
FEF2575-%PRED-POST: 82 %
FEF2575-%PRED-PRE: 71 %
FEV1-%CHANGE-POST: 3 %
FEV1-%PRED-POST: 68 %
FEV1-%Pred-Pre: 65 %
FEV1-POST: 1.4 L
FEV1-Pre: 1.34 L
FEV1FVC-%Change-Post: -6 %
FEV1FVC-%Pred-Pre: 104 %
FEV6-%Change-Post: 11 %
FEV6-%PRED-POST: 73 %
FEV6-%Pred-Pre: 65 %
FEV6-POST: 1.89 L
FEV6-Pre: 1.7 L
FEV6FVC-%PRED-POST: 105 %
FEV6FVC-%Pred-Pre: 105 %
FVC-%Change-Post: 11 %
FVC-%PRED-PRE: 62 %
FVC-%Pred-Post: 69 %
FVC-PRE: 1.7 L
FVC-Post: 1.89 L
POST FEV1/FVC RATIO: 74 %
Post FEV6/FVC ratio: 100 %
Pre FEV1/FVC ratio: 79 %
Pre FEV6/FVC Ratio: 100 %
RV % pred: 166 %
RV: 3.51 L
TLC % PRED: 112 %
TLC: 5.32 L

## 2017-06-01 LAB — COMPREHENSIVE METABOLIC PANEL
ALBUMIN: 3.8 g/dL (ref 3.5–5.0)
ALT: 13 U/L — ABNORMAL LOW (ref 14–54)
AST: 17 U/L (ref 15–41)
Alkaline Phosphatase: 59 U/L (ref 38–126)
Anion gap: 10 (ref 5–15)
BUN: 11 mg/dL (ref 6–20)
CALCIUM: 8.9 mg/dL (ref 8.9–10.3)
CO2: 30 mmol/L (ref 22–32)
Chloride: 100 mmol/L — ABNORMAL LOW (ref 101–111)
Creatinine, Ser: 0.64 mg/dL (ref 0.44–1.00)
GFR calc Af Amer: >60 mL/min (ref >60)
GFR calc non Af Amer: >60 mL/min (ref >60)
GLUCOSE: 119 mg/dL — AB (ref 65–99)
Potassium: 4.4 mmol/L (ref 3.5–5.1)
SODIUM: 140 mmol/L (ref 135–145)
Total Bilirubin: 0.5 mg/dL (ref 0.3–1.2)
Total Protein: 6.2 g/dL — ABNORMAL LOW (ref 6.5–8.1)

## 2017-06-01 LAB — BLOOD GAS, ARTERIAL
Acid-Base Excess: 3.7 mmol/L — ABNORMAL HIGH (ref 0.0–2.0)
Bicarbonate: 33.6 mmol/L — ABNORMAL HIGH (ref 20.0–28.0)
DRAWN BY: 235321
O2 Content: 3 L/min
O2 SAT: 89.4 %
PATIENT TEMPERATURE: 98.6
pCO2 arterial: 81.3 mmHg (ref 32.0–48.0)
pH, Arterial: 7.24 — ABNORMAL LOW (ref 7.350–7.450)
pO2, Arterial: 69.3 mmHg — ABNORMAL LOW (ref 83.0–108.0)

## 2017-06-01 LAB — GLUCOSE, CAPILLARY
GLUCOSE-CAPILLARY: 96 mg/dL (ref 65–99)
GLUCOSE-CAPILLARY: 87 mg/dL (ref 65–99)

## 2017-06-01 LAB — MAGNESIUM: Magnesium: 1.8 mg/dL (ref 1.7–2.4)

## 2017-06-01 LAB — PHOSPHORUS: Phosphorus: 4.8 mg/dL — ABNORMAL HIGH (ref 2.5–4.6)

## 2017-06-01 MED ORDER — IPRATROPIUM-ALBUTEROL 0.5-2.5 (3) MG/3ML IN SOLN: 3.0000 mL | Freq: Four times a day (QID) | RESPIRATORY_TRACT | 0 refills | Status: DC | PRN

## 2017-06-01 MED ORDER — ALBUTEROL SULFATE HFA 108 (90 BASE) MCG/ACT IN AERS: 2.0000 | INHALATION_SPRAY | Freq: Four times a day (QID) | RESPIRATORY_TRACT | 2 refills | Status: DC | PRN

## 2017-06-01 MED ORDER — GUAIFENESIN ER 600 MG PO TB12: 1200.0000 mg | ORAL_TABLET | Freq: Two times a day (BID) | ORAL | 0 refills | Status: DC

## 2017-06-01 MED ORDER — ALBUTEROL SULFATE (2.5 MG/3ML) 0.083% IN NEBU
2.5000 mg | INHALATION_SOLUTION | Freq: Once | RESPIRATORY_TRACT | Status: AC
  Administered 2017-06-01: 2.5 mg via RESPIRATORY_TRACT

## 2017-06-01 NOTE — Progress Notes (Signed)
Per [facility] they are not able to provide Trilogy home vent. [facility]-will check all documentation for appropriateness. Home 02 3lnc continuous. They will be able to provide Trilogy home vent,& home 02. [facility] will provide HHRN, respiratory care.

## 2017-06-01 NOTE — Progress Notes (Addendum)
Pt placed on cpap per on call Bodenheimer's orders due to morning ABG. Pt has now taken cpap off and is refusing to wear it, pt stating "I can't breathe with that thing on". Pt educated on why it is necessary for her to wear it and still refusing cpap. Pt placed on 4L oxygen via [place] and O2 saturation is 99%. Bodenheimer made aware of situation. Will continue to monitor pt.

## 2017-06-01 NOTE — Care Management Note (Signed)
Case Management Note  Patient Details  Name: Paula Andrews MRN: [id number] Date of Birth: Sep 01, 1945  Subjective/Objective:  [facility] rep-Ashley will have neb machine brought to patient's home. Their service rep will meet patient @ home to set up home vent,home 02-they have spouse c# to coordinate all arrangements. Nsg updated. No further CM needs.                  Action/Plan:d/c home w/HHC-[facility]/02,home vent,neb machine-[facility].  Expected Discharge Date:  06/01/17               Expected Discharge Plan:  [place]  In-House Referral:     Discharge planning Services  CM Consult  Post Acute Care Choice:    Choice offered to:     DME Arranged:  Other see comment, Oxygen, Nebulizer machine(Trilogy home vent) DME Agency:  [place]  HH Arranged:  RN, Respirator Therapy HH Agency:  [place]  Status of Service:  Completed, signed off  If discussed at [place] of Stay Meetings, dates discussed:    Additional Comments:  Dessa Phi, RN 06/01/2017, 3:16 PM

## 2017-06-01 NOTE — Care Management Note (Signed)
Case Management Note  Patient Details  Name: Paula Andrews MRN: [id number] Date of Birth: 03-17-1946  Subjective/Objective:  [facility] confirmed they can manage home vent-all documentation provided-they will deliver Trilogy  home vent to patient's home,they will also provide home 02-they have delivere travel tank to rm already. [facility] rep karen aware of d/c today for HHRN/resp care.No further CM needs.                  Action/Plan:d/c home w/HHC/DME-home vent,02   Expected Discharge Date:  (unknown)               Expected Discharge Plan:  [place]  In-House Referral:     Discharge planning Services  CM Consult  Post Acute Care Choice:    Choice offered to:     DME Arranged:  Other see comment, Oxygen(Trilogy home vent) DME Agency:  [place]  HH Arranged:  RN, Respirator Therapy HH Agency:  [place]  Status of Service:  Completed, signed off  If discussed at [place] of Stay Meetings, dates discussed:    Additional Comments:  Dessa Phi, RN 06/01/2017, 2:02 PM

## 2017-06-01 NOTE — Progress Notes (Addendum)
Name: Paula Andrews MRN: [id number] DOB: Aug 21, 1945    ADMISSION DATE:  05/29/2017 CONSULTATION DATE:  2/28  REFERRING MD :  Alfredia Ferguson  CHIEF COMPLAINT:  Hypoxia   BRIEF PATIENT DESCRIPTION:  72 year old female patient with a significant history of hypertension, diabetes, thyroidism, and some degree of dementia.  Recently discharged after being hospitalized with a working diagnosis of dehydration and resultant hyponatremia, further complicated by thiazide diuretic.  She was treated with IV hydration and instructed to discontinue hydrochlorothiazide.  During that time it was noted she had significant sleep disturbance specifically loud snoring and operation was made for an outpatient sleep study (this did not get completed). He presented to the emergency room on 2/27 with approximately 3-day history of worsening confusion, increased lethargy, and desaturation as low as 67% when sleeping.  Apparently her husband is on oxygen at home, the patient's been using oxygen from time to time.  Initial chest x-ray in the emergency room showed clear chest x-ray without infiltrate.  CT was obtained, was negative for pulmonary embolus or acute pulmonary infiltrates, arterial blood gas on 2 L showed a pH of 7.398, PCO2 of 50.5, PO2 of 82.7.  She was admitted with a working diagnosis of acute encephalopathy, as well as what appears to be relatively chronic hypercapnia.  Treatment to date has included supplemental oxygen, bronchodilators, and CPAP therapy.  Pulmonary asked to see in evaluation in regards to her hypoxia.  SIGNIFICANT EVENTS    STUDIES:  CT chest 2/26IMPRESSION: 1. No acute intrathoracic pathology. No CT evidence of pulmonary embolism. 2. Mild cardiomegaly. 3. Aortic Atherosclerosis (ICD10-I70.0) and Emphysema (ICD10-J43.9). I have reviewed the images personally  Echocardiogram 2/28>> Mild LVH, EF 65-70%. Grade 1 diastolic dysfunction  HISTORY OF PRESENT ILLNESS: See above  PAST MEDICAL  HISTORY :   has a past medical history of Dementia, Diabetes mellitus, Hypertension, and Thyroid disease.  has a past surgical history that includes Abdominal hysterectomy. Prior to Admission medications   Medication Sig Start Date End Date Taking? Authorizing Provider  Ascorbic Acid (VITAMIN C) 1000 MG tablet Take 1,000 mg by mouth daily.   Yes [provider]  Calcium-Vitamin D 600-200 MG-UNIT per tablet Take 1 tablet by mouth daily.   Yes [provider]  levothyroxine (SYNTHROID, LEVOTHROID) 75 MCG tablet Take 75 mcg by mouth daily.    Yes [provider]  lisinopril (PRINIVIL,ZESTRIL) 5 MG tablet Take 5 mg by mouth daily.  05/26/17  Yes [provider]  Memantine HCl ER 21 MG CP24 Take 1 capsule by mouth daily.   Yes [provider]  metFORMIN (GLUCOPHAGE) 500 MG tablet Take 250 mg by mouth 2 (two) times daily with a meal.    Yes [provider]  metoprolol succinate (TOPROL-XL) 25 MG 24 hr tablet Take 25 mg by mouth every morning.   Yes [provider]  nitrofurantoin, macrocrystal-monohydrate, (MACROBID) 100 MG capsule Take 100 mg by mouth 2 (two) times daily.  05/26/17  Yes [provider]  ondansetron (ZOFRAN) 4 MG tablet Take 4 mg by mouth every 8 (eight) hours as needed for nausea or vomiting.  05/26/17  Yes [provider]  pravastatin (PRAVACHOL) 20 MG tablet Take 20 mg by mouth daily.    Yes [provider]  amLODipine (NORVASC) 5 MG tablet Take 1 tablet (5 mg total) by mouth daily. Patient not taking: Reported on 05/29/2017 05/19/17 06/18/17  Irwin Brakeman L, MD  lisinopril (PRINIVIL,ZESTRIL) 10 MG tablet Take 1 tablet (10  mg total) by mouth daily. Patient not taking: Reported on 05/29/2017 05/18/17 06/17/17  Murlean Iba, MD   No Known Allergies  FAMILY HISTORY:  family history includes Diabetes in her unknown relative. SOCIAL HISTORY:  reports that  has never smoked. she has never  used smokeless tobacco. She reports that she does not drink alcohol or use drugs.  REVIEW OF SYSTEMS:   Not able to obtain given dementia  SUBJECTIVE:  Used CPAP last night. Has issues with mask fit and feeling of suffocation.  Awake, interactive today morning without complaints ABG from today AM reviewed showing 7.24/81/69/89%  VITAL SIGNS: Temp:  [97.7 F (36.5 C)-98.8 F (37.1 C)] 97.8 F (36.6 C) (03/01 0506) Pulse Rate:  [60-65] 65 (03/01 0506) Resp:  [16-17] 17 (03/01 0506) BP: (109-152)/(59-88) 152/59 (03/01 0506) SpO2:  [97 %-98 %] 97 % (03/01 0506)  PHYSICAL EXAMINATION: Gen:      No acute distress HEENT:  EOMI, sclera anicteric Neck:     No masses; no thyromegaly Lungs:    Clear to auscultation bilaterally; normal respiratory effort CV:         Regular rate and rhythm; no murmurs Abd:      + bowel sounds; soft, non-tender; no palpable masses, no distension Ext:    No edema; adequate peripheral perfusion Skin:      Warm and dry; no rash Neuro: alert and oriented x 3 Psych: normal mood and affect  Recent Labs  Lab 05/30/17 1037 05/31/17 0442 06/01/17 0449  NA 136 139 140  K 4.1 3.9 4.4  CL 96* 99* 100*  CO2 31 31 30   BUN 8 12 11   CREATININE 0.74 0.81 0.64  GLUCOSE 170* 123* 119*   Recent Labs  Lab 05/30/17 1037 05/31/17 0442 06/01/17 0449  HGB 13.4 12.9 13.5  HCT 41.7 40.3 42.8  WBC 6.8 7.7 8.5  PLT 267 301 256   ABG    Component Value Date/Time   PHART 7.240 (L) 06/01/2017 0441   PCO2ART 81.3 (HH) 06/01/2017 0441   PO2ART 69.3 (L) 06/01/2017 0441   HCO3 33.6 (H) 06/01/2017 0441   TCO2 30.8 07/17/2011 1351   O2SAT 89.4 06/01/2017 0441   Dg Chest Port 1 View  Result Date: 06/01/2017 CLINICAL DATA:  Shortness of breath. EXAM: PORTABLE CHEST 1 VIEW COMPARISON:  CT 05/29/2017.  Chest x-ray 05/29/2017. FINDINGS: Mediastinum hilar structures normal. Stable mild cardiomegaly. No focal infiltrate. No pleural effusion or pneumothorax. Chest is stable  from prior exam. Carotid vascular calcification. IMPRESSION: 1. Stable mild cardiomegaly. No pulmonary venous congestion. No acute pulmonary infiltrate. 2.  Carotid vascular disease. Electronically Signed   By: Marcello Moores  Register   On: 06/01/2017 06:59    ASSESSMENT / PLAN: Chronic hypoxic and hypercarbic respiratory failure Probable COPD Probable obstructive sleep apnea Acute delirium Dementia  Discussion: Suspect presentation is chronic hypoxic, hypercarbic respiratory failure due to underlying OSA/OHS with acute worsening due to UTI Needs evaluation for COPD given her smoking history. No evidence of pulmonary hypertension on echocardiogram or ILD on CT scan  Plan/recommendations Continue noninvasive ventilator.  Will order BiPAP for tonight Try nasal pillows for comfort as she is not tolerating full facemask She will need to go home on noninvasive ventilator as she is at high risk for decompensation, readmission and death without therapy. Pap therapy is ineffective Discussed with case management and order placed. Coorrdinated with Dr. Alfredia Ferguson, Hospitalist.  We will arrange for sleep study and PFTs as an outpatient  Marshell Garfinkel MD  [facility] Pager [phone] If no answer or after 3pm call: [phone] 06/01/2017, 10:23 AM

## 2017-06-01 NOTE — Progress Notes (Signed)
SATURATION QUALIFICATIONS: (This note is used to comply with regulatory documentation for home oxygen)  Patient Saturations on Room Air at Rest = 95%  Patient Saturations on Room Air while Ambulating = 84%  Patient Saturations on 2 Liters of oxygen while Ambulating = 94%  Please briefly explain why patient needs home oxygen: 

## 2017-06-01 NOTE — Care Management Note (Signed)
Case Management Note  Patient Details  Name: VICKII VOLLAND MRN: [id number] Date of Birth: Jul 22, 1945  Subjective/Objective:Ordered for trilogy home vent-[facility] rep Santiago Glad will check documentation for appropriateness. Patient/family in rm agree to home vent-it takes 24hrs for set up once all documentation is complete-PCCM/MD notified. Also recc HHRN, respiratory therapy.                     Action/Plan:d/c home w/home vent/HHC.   Expected Discharge Date:  (unknown)               Expected Discharge Plan:  [place]  In-House Referral:     Discharge planning Services  CM Consult  Post Acute Care Choice:    Choice offered to:     DME Arranged:    DME Agency:  [place] Arranged:  RN, Respirator Therapy [place] Agency:  [place]  Status of Service:  In process, will continue to follow  If discussed at [place] of Stay Meetings, dates discussed:    Additional Comments:  Dessa Phi, RN 06/01/2017, 10:22 AM

## 2017-06-01 NOTE — Progress Notes (Addendum)
CRITICAL VALUE ALERT  Critical Value:  ABG  Date & Time Notied:  4:56 AM 06/01/17  Provider Notified: On call Bodenheimer  Orders Received/Actions taken: Bodenheimer ordered pt to be placed back on Cpap.   Will continue to monitor pt closely.

## 2017-06-01 NOTE — Progress Notes (Signed)
Discharge instructions and medications discussed with patient.  AVS given to patient.  All questions answered.  

## 2017-06-03 DIAGNOSIS — N390 Urinary tract infection, site not specified: Secondary | ICD-10-CM | POA: Diagnosis not present

## 2017-06-03 DIAGNOSIS — F0280 Dementia in other diseases classified elsewhere without behavioral disturbance: Secondary | ICD-10-CM | POA: Diagnosis not present

## 2017-06-03 DIAGNOSIS — I10 Essential (primary) hypertension: Secondary | ICD-10-CM | POA: Diagnosis not present

## 2017-06-03 DIAGNOSIS — J9611 Chronic respiratory failure with hypoxia: Secondary | ICD-10-CM | POA: Diagnosis not present

## 2017-06-03 DIAGNOSIS — E785 Hyperlipidemia, unspecified: Secondary | ICD-10-CM | POA: Diagnosis not present

## 2017-06-03 DIAGNOSIS — G309 Alzheimer's disease, unspecified: Secondary | ICD-10-CM | POA: Diagnosis not present

## 2017-06-03 DIAGNOSIS — F17211 Nicotine dependence, cigarettes, in remission: Secondary | ICD-10-CM | POA: Diagnosis not present

## 2017-06-03 DIAGNOSIS — J9612 Chronic respiratory failure with hypercapnia: Secondary | ICD-10-CM | POA: Diagnosis not present

## 2017-06-03 DIAGNOSIS — J449 Chronic obstructive pulmonary disease, unspecified: Secondary | ICD-10-CM | POA: Diagnosis not present

## 2017-06-03 DIAGNOSIS — G9340 Encephalopathy, unspecified: Secondary | ICD-10-CM | POA: Diagnosis not present

## 2017-06-03 DIAGNOSIS — E119 Type 2 diabetes mellitus without complications: Secondary | ICD-10-CM | POA: Diagnosis not present

## 2017-06-03 DIAGNOSIS — E039 Hypothyroidism, unspecified: Secondary | ICD-10-CM | POA: Diagnosis not present

## 2017-06-04 ENCOUNTER — Telehealth: Payer: Self-pay

## 2017-06-04 DIAGNOSIS — R0602 Shortness of breath: Secondary | ICD-10-CM

## 2017-06-04 DIAGNOSIS — G4733 Obstructive sleep apnea (adult) (pediatric): Secondary | ICD-10-CM

## 2017-06-04 NOTE — Telephone Encounter (Signed)
-----   Message from Marshell Garfinkel, MD sent at 06/01/2017 10:28 AM EST ----- Regarding: Hospital follow up Hi,  Paula Andrews was seen as hospital consult and will need hospital follow up at next available. Will need PFTs and split night sleep study ordered as outpatient. Thanks.  Praveen

## 2017-06-04 NOTE — Telephone Encounter (Signed)
Pt requested that I speak with her husband, [name]. [name] is aware of below message and voiced his understanding.  Pt has been scheduled for PFT on 06/07/17 and HFU with TP on 06/12/17. Split night has been ordered. Nothing further is needed.

## 2017-06-06 NOTE — Discharge Summary (Addendum)
Physician Discharge Summary  Paula Andrews NAT:[id number] DOB: [birth year] DOA: 05/29/2017  PCP: Celene Squibb, MD  Admit date: 05/29/2017 Discharge date: 06/01/2017  Admitted From: Home Disposition: Home with [facility] RN and Respiratory Therapy  Recommendations for Outpatient Follow-up:  1. Follow up with PCP in 1-2 weeks 2. Follow up with Pulmonary in 1-2 weeks 3. Follow up with Psychiatry in 1-2 weeks 4. Follow up with Neurology as an outpatient 5. Have an overnight sleep study done and PFTs 6. Please obtain CMP/CBC, Mag, Pos in one week 7. Please follow up on the following pending results:  Home Health: YES Equipment/Devices: Home O2 and Trilogy Home Vent  Discharge Condition: Stable  CODE STATUS: FULL CODE Diet recommendation: Heart Healthy Diet   Brief/Interim Summary: Paula Mumford Milleris a 72 y.o.femalewith medical history significant ofHTN, DM type II, dementia, hypothyroidism;who presented with complaints of worsening confusion and shortness of breath. [facility] presenting with generalized weakness and found to have significant hyponatremia secondary to dehydration. At discharge it appears patient was taken off of her lisinopril/hydrochlorothiazide. Patient was seen at urgent care 3 days ago and diagnosed with a urinary tract infection for which she has been on nitrofurantoin.Since being home family notes that the patient's been more confused,and was unable to recognize her husband at times. She is more lethargic and reportedly has been sleeping much of the day. Husband reported O2 saturations as low as 67% 2days ago while the patient was sleeping. She has been using her husband'soxygendue to O2 sat dropping and feeling short of breath. She was scheduled to have a sleep study done at [facility] on the day of admission,but presented to the hospital due to worsening symptoms. Admitted for Acute  Respiratory Failure with Hypoxia and Hypercapnia and currently undergoing further workup and evaluation.  Patient had to be placed on CPAP last night because she desaturated significantly into the 70's. Psychiatry was consulted as there was some question SI/HI given grand-daughter's concern but she was cleared by Dr. Mariea Clonts and removed off of Safety and Suicide precautions. Pulmonary was consulted regarding her Hypoxia and are obtaining PFT's and an AM ABG off of CPAP and she qualified for Trilogy Vent. She was improved and PCCM cleared patient for Discharge and she will follow up with them for outpatient Sleep Study and PFT's. She will also need to see PCP, Neurology, and Psychiatry as an outpatient.   Discharge Diagnoses:  Principal Problem:   Adjustment 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 Active Problems:   Acute respiratory failure with hypoxia and hypercapnia (HCC)   Acute lower UTI   Essential hypertension   Diabetes mellitus type II, controlled ([place])   Hyperlipemia   UTI (urinary tract infection)   Chronic respiratory failure with hypoxia and hypercapnia (HCC)  Acute on Chronic Respiratory Failure with hypoxiaand hypercapnia in a patient with a History of Emphysema and suspected Underlying COPD -Acute.Patient presented with complaints of shortness of breath. -Past medical history significant for remote history of tobacco abuse.  -O2 saturations noted to be as low as 84% on arrival on room air.  -ABG obtained showing normal pH 7.398with PCO2 of 50.5 and PO2 of 82.7 reported on room air,but patient had been on 2 L of nasal cannula oxygen during most of her time in the ED. Ondifferential included possibility of pulmonary edema, although chest x-ray appeared clear withmild cardiomegaly since patient recently stopped taking hydrochlorothiazide -Likely has Underlying COPD and OHS/OSA. -Admitted to  a Telemetry bed -Continuous pulse oximetry overnight with nasal  cannula oxygen as needed; Had an over night Oximetry Screen and Morning ABG showed CO2 close to 80 -Maintain O2 Saturations >92%  -DuoNebs q6hprn sob/wheezing -Strict I&O's and Daily Weights -Checked Echocardiogram and showed EF of 65-70% and Grade 1 DD and  BNP still pending  -C/w IV Abx with Ceftriaxone; May escalate if not improving  -CT Scan of Chest to r/o PE showed No acute intrathoracic pathology. No CT evidence of pulmonary embolism, Mild cardiomegaly. Aortic Atherosclerosis and Mild Centrilobular Emphysema, and no focal consolidation, pleural effusion, or Pneumothorax   -Will add Flutter Valve, Incentive Spirometry, and add Guaifenesin 1200 mg po daily -Ambulatory Home O2 Screen  patient Desaturated into the 80's -Had to be placed on CPAP overnight and autotitrated -Obtaining Official Pulmonary Consultation  -Pulmonary obtaining PFT's and checking AM ABG off of CPAP and if has significant chronic hypercarbia and underlying obstructive airway disease, they are recommending Trilogy -Patient did qualify for Triology and will have outpatient sleep Study; She needs Triology given high risk -Patient to follow up with Pulmonary as an outpatient for Sleep Study and PFTs  Acute Encephalopathy in a Patient with a History of Dementia, improving  -Patient presented with worsening confusion. Initial workup unremarkable.  -Worsening periods of hypercapnia and hypoxia likely cause of patient's increased lethargy and confusion. -Also question possibility of underlying infection although patient has been taking nitrofurantoin for UTI vs.recent medication changesvs other.  -Patient had been scheduled to have sleep study in [facility], butdid not make appointment. -C/wMemantine 21 mg po Daily  -Delirium Precautions  -Recent Head CT 05/16/17 showed No acute intracranial process. 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  Moderate to severe chronic small vessel ischemic disease -Patient  will benefit from rescheduling sleep study an can can be done as an outpatient   Urinary Tract Infection  -Patient has been on nitrofurantoin for the last 4 days. Urinalysis appears abnormal. -Checked Urine Culture and was Negative -Hold Nitrofurantoin -Started Empirical IV Ceftriaxone and changed to po At D/C   Essential Hypertension -C/w Metoprolol 25 mg po Daily and Lisinopril 5 mg po Daily   Hypothyroidism -Last TSH noted to be 2.24 on 05/16/2017. -Continue Levothyroxine 75 mcg po Daily    Diabetes Mellitus Type 2 -Appears well controlled on home medications of metformin. -Hypoglycemic protocols -Hold Home Metformin -C/w Sensitive Novolog SSI AC -CBG's ranging from 87-143  Hyperlipidemia -Continue Pravastatin 20 mg po qHS  Hypomagnesemia -Patient's Mag Level was 1.4 and improved to 1.8 -Continue to Monitor and Replete Mag in AM  -Repeat Mag Level in AM   ? SI/HI ruled out and evaluated by Psych Sales promotion account executive and Suicide Precautions utilized but now Discontinued. -No Active SI/HI/AVH -Consulted Psychiatry Dr. Mariea Clonts who cleared the patient -Recommending Outpatient Psychiatry and Therapy and recommending Melatonin 3-6 mg qHS to Regulate Sleep/Wake Cycle  -Does not warrant Inpatient Psychiatry Hospitalization at this time.   Discharge Instructions  Discharge Instructions    Ambulatory referral to Neurology   Complete by:  As directed    An appointment is requested in approximately: 1-2 weeks for Worsening Dementia   Call MD for:  difficulty breathing, headache or visual disturbances   Complete by:  As directed    Call MD for:  extreme fatigue   Complete by:  As directed    Call MD for:  hives   Complete by:  As directed    Call MD for:  persistant dizziness or  light-headedness   Complete by:  As directed    Call MD for:  persistant nausea and vomiting   Complete by:  As directed    Call MD for:  redness, tenderness, or signs of infection (pain,  swelling, redness, odor or green/yellow discharge around incision site)   Complete by:  As directed    Call MD for:  severe uncontrolled pain   Complete by:  As directed    Call MD for:  temperature >100.4   Complete by:  As directed    Diet - low sodium heart healthy   Complete by:  As directed    Discharge instructions   Complete by:  As directed    Follow up with PCP, Psychiatry, Neurology, and Pulmonary as an outpatient. Take all medications as prescribed. If symptoms change or worsen please return to the ED for evaluation.   Increase activity slowly   Complete by:  As directed      Allergies as of 06/01/2017   No Known Allergies     Medication List    STOP taking these medications   nitrofurantoin (macrocrystal-monohydrate) 100 MG capsule Commonly known as:  MACROBID     TAKE these medications   albuterol 108 (90 Base) MCG/ACT inhaler Commonly known as:  PROVENTIL HFA;VENTOLIN HFA Inhale 2 puffs into the lungs every 6 (six) hours as needed for wheezing or shortness of breath.   amLODipine 5 MG tablet Commonly known as:  NORVASC Take 1 tablet (5 mg total) by mouth daily.   Calcium-Vitamin D 600-200 MG-UNIT tablet Take 1 tablet by mouth daily.   guaiFENesin 600 MG 12 hr tablet Commonly known as:  MUCINEX Take 2 tablets (1,200 mg total) by mouth 2 (two) times daily.   ipratropium-albuterol 0.5-2.5 (3) MG/3ML Soln Commonly known as:  DUONEB Take 3 mLs by nebulization every 6 (six) hours as needed.   levothyroxine 75 MCG tablet Commonly known as:  SYNTHROID, LEVOTHROID Take 75 mcg by mouth daily.   lisinopril 5 MG tablet Commonly known as:  PRINIVIL,ZESTRIL Take 5 mg by mouth daily. What changed:  Another medication with the same name was removed. Continue taking this medication, and follow the directions you see here.   Memantine HCl ER 21 MG Cp24 Take 1 capsule by mouth daily.   metFORMIN 500 MG tablet Commonly known as:  GLUCOPHAGE Take 250 mg by mouth 2  (two) times daily with a meal.   metoprolol succinate 25 MG 24 hr tablet Commonly known as:  TOPROL-XL Take 25 mg by mouth every morning.   ondansetron 4 MG tablet Commonly known as:  ZOFRAN Take 4 mg by mouth every 8 (eight) hours as needed for nausea or vomiting.   pravastatin 20 MG tablet Commonly known as:  PRAVACHOL Take 20 mg by mouth daily.   vitamin C 1000 MG tablet Take 1,000 mg by mouth daily.      Follow-up Information    [facility] Follow up.   Specialty:  Home Health Services Why:  [facility] nursing/respiratory therapy Contact information: [place] [address] [phone]        [facility] Follow up.   Why:  Trilogy home vent, oxygen,neb machine Contact information: [place] [address] [phone]          No Known Allergies  Consultations:  Pulmonary  Psychiatry  Procedures/Studies: Dg Chest 2 View  Result Date: 05/29/2017 CLINICAL DATA:  Altered mental status. EXAM: CHEST  2 VIEW COMPARISON:  PA and lateral chest 05/16/2017. CT chest 07/15/2011. PA and lateral chest 07/12/2011. FINDINGS: There is cardiomegaly without edema. Aortic atherosclerosis is noted. No pneumothorax or pleural effusion. No acute bony abnormality. IMPRESSION: Cardiomegaly without acute disease. Atherosclerosis. Electronically Signed   By: Inge Rise M.D.   On: 05/29/2017 19:02   Dg Chest 2 View  Result Date: 05/16/2017 CLINICAL DATA:  72 year old female with hyponatremia, weakness, shortness of breath. EXAM: CHEST  2 VIEW COMPARISON:  Chest CTA 07/15/2011 and earlier. FINDINGS: Upright AP and lateral views of the chest. Mediastinal contours remain normal. Calcified aortic atherosclerosis. Visualized tracheal air column is within normal limits. Stable lung volumes and mild diffuse increased pulmonary interstitial markings. No pneumothorax, pulmonary edema, pleural effusion or acute pulmonary opacity. No  acute osseous abnormality identified. Negative visible bowel gas pattern. IMPRESSION: No acute cardiopulmonary abnormality. Electronically Signed   By: Genevie Ann M.D.   On: 05/16/2017 23:13   Ct Head Wo Contrast  Result Date: 05/16/2017 CLINICAL DATA:  Generalized weakness and altered mental status for a few weeks. History of dementia, hypertension, diabetes. EXAM: CT HEAD WITHOUT CONTRAST TECHNIQUE: Contiguous axial images were obtained from the base of the skull through the vertex without intravenous contrast. COMPARISON:  None. FINDINGS: BRAIN: No intraparenchymal hemorrhage, mass effect nor midline shift. The ventricles and sulci are normal for age. Confluent supratentorial white matter hypodensities. Small area LEFT mesial occipital lobe encephalomalacia. No acute large vascular territory infarcts. No abnormal extra-axial fluid collections. Basal cisterns are patent. Empty sella. VASCULAR: Moderate calcific atherosclerosis of the carotid siphons. SKULL: No skull fracture. No significant scalp soft tissue swelling. SINUSES/ORBITS: The mastoid air-cells and included paranasal sinuses are well-aerated.The included ocular globes and orbital contents are non-suspicious. OTHER: None. IMPRESSION: 1. No acute intracranial process. 2. 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 3. Moderate to severe chronic small vessel ischemic disease. Electronically Signed   By: Elon Alas M.D.   On: 05/16/2017 22:53   Ct Angio Chest Pe W Or Wo Contrast  Result Date: 05/29/2017 CLINICAL DATA:  72 year old female with altered mental status. PE is suspected. EXAM: CT ANGIOGRAPHY CHEST WITH CONTRAST TECHNIQUE: Multidetector CT imaging of the chest was performed using the standard protocol during bolus administration of intravenous contrast. Multiplanar CT image reconstructions and MIPs were obtained to evaluate the vascular anatomy. CONTRAST:  123mL ISOVUE-370 IOPAMIDOL (ISOVUE-370) INJECTION 76% COMPARISON:  Chest  radiograph dated 05/29/2017 FINDINGS: Cardiovascular: Borderline cardiomegaly. No pericardial effusion. Coronary vascular calcification. There is advanced calcified and noncalcified plaque of the thoracic aorta. No aneurysmal dilatation or evidence of dissection. No CT evidence of pulmonary embolism. Mediastinum/Nodes: No hilar or mediastinal adenopathy. The esophagus and the thyroid gland are grossly unremarkable. No mediastinal fluid collection. Lungs/Pleura: Mild centrilobular emphysema. No focal consolidation, pleural effusion, or pneumothorax. The central airways are patent. Upper Abdomen: 2 adjacent small hypodense lesions in the dome of the liver measure up to 9 mm are not characterized on this CT. The visualized upper abdomen is otherwise unremarkable. Musculoskeletal: Degenerative changes of the spine. No acute osseous pathology. Review of the MIP images confirms the above findings. IMPRESSION: 1. No acute intrathoracic pathology. No CT evidence of pulmonary embolism. 2. Mild cardiomegaly. 3. Aortic Atherosclerosis (ICD10-I70.0) and Emphysema (ICD10-J43.9). Electronically Signed   By: Anner Crete M.D.   On: 05/29/2017 21:48   Mr Brain Wo Contrast  Result Date: 05/17/2017 CLINICAL DATA:  Altered mental status.  Dementia. EXAM: MRI HEAD WITHOUT CONTRAST TECHNIQUE: Multiplanar, multiecho pulse  sequences of the brain and surrounding structures were obtained without intravenous contrast. COMPARISON:  CT brain 05/16/2017. FINDINGS: Brain: No evidence for acute infarction, hemorrhage, mass lesion, hydrocephalus, or extra-axial fluid. Generalized atrophy. Advanced T2 and FLAIR hyperintensities throughout the white matter, consistent with small vessel disease. Chronic LEFT occipital infarct. Tiny chronic LEFT cerebellar infarcts. Vascular: Normal flow voids. Skull and upper cervical spine: Normal marrow signal. Partial empty sella. Sinuses/Orbits: Negative. Other: None. IMPRESSION: Atrophy and small vessel  disease.  No acute intracranial findings. Electronically Signed   By: Staci Righter M.D.   On: 05/17/2017 14:32   Dg Chest Port 1 View  Result Date: 06/01/2017 CLINICAL DATA:  Shortness of breath. EXAM: PORTABLE CHEST 1 VIEW COMPARISON:  CT 05/29/2017.  Chest x-ray 05/29/2017. FINDINGS: Mediastinum hilar structures normal. Stable mild cardiomegaly. No focal infiltrate. No pleural effusion or pneumothorax. Chest is stable from prior exam. Carotid vascular calcification. IMPRESSION: 1. Stable mild cardiomegaly. No pulmonary venous congestion. No acute pulmonary infiltrate. 2.  Carotid vascular disease. Electronically Signed   By: Marcello Moores  Register   On: 06/01/2017 06:59    ECHOCARDIOGRAM ------------------------------------------------------------------- Study Conclusions  - Left ventricle: The cavity size was normal. There was mild   concentric hypertrophy. Systolic function was vigorous. The   estimated ejection fraction was in the range of 65% to 70%. Wall   motion was normal; there were no regional wall motion   abnormalities. Doppler parameters are consistent with abnormal   left ventricular relaxation (grade 1 diastolic dysfunction).  Subjective: Seen and examined at bedside and she had improved. No CP or SOB. Pulmonary recommending Trilogy Vent given Respiratory Failure; Stable for D/C and will follow up with PCP and Pulmonary as an outpatient.    Discharge Exam: Vitals:   06/01/17 0506 06/01/17 1405  BP: (!) 152/59 (!) 153/61  Pulse: 65 65  Resp: 17 17  Temp: 97.8 F (36.6 C) 99 F (37.2 C)  SpO2: 97% 99%   Vitals:   05/31/17 1325 05/31/17 2026 06/01/17 0506 06/01/17 1405  BP: (!) 146/88 109/86 (!) 152/59 (!) 153/61  Pulse: 63 60 65 65  Resp:  16 17 17   Temp: 98.8 F (37.1 C) 97.7 F (36.5 C) 97.8 F (36.6 C) 99 F (37.2 C)  TempSrc: Oral Oral Oral Oral  SpO2: 97% 98% 97% 99%  Weight:      Height:       General: Pt is alert, awake, not in acute  distress Cardiovascular: RRR, S1/S2 +, no rubs, no gallops Respiratory: Diminished bilaterally, no wheezing, no rhonchi Abdominal: Soft, NT, ND, bowel sounds + Extremities: no edema, no cyanosis  The results of significant diagnostics from this hospitalization (including imaging, microbiology, ancillary and laboratory) are listed below for reference.    Microbiology: Recent Results (from the past 240 hour(s))  Urine Culture     Status: None   Collection Time: 05/29/17  5:28 PM  Result Value Ref Range Status   Specimen Description   Final    URINE, CLEAN CATCH Performed at [facility], [place] [address], [place], [place] [address]    Special Requests   Final    NONE Performed at [facility], [place] [address] [place], [place] [address]    Culture   Final    NO GROWTH Performed at [facility], [place] [address] [place], [place] [address]    Report Status 05/31/2017 FINAL  Final    Labs: BNP (last 3 results) Recent Labs  05/31/17 1648  BNP 60.7   Basic Metabolic Panel: Recent Labs  Lab 05/31/17 0442 06/01/17 0449  NA 139 140  K 3.9 4.4  CL 99* 100*  CO2 31 30  GLUCOSE 123* 119*  BUN 12 11  CREATININE 0.81 0.64  CALCIUM 9.1 8.9  MG 2.2 1.8  PHOS 3.7 4.8*   Liver Function Tests: Recent Labs  Lab 05/31/17 0442 06/01/17 0449  AST 17 17  ALT 12* 13*  ALKPHOS 56 59  BILITOT 0.5 0.5  PROT 6.2* 6.2*  ALBUMIN 3.9 3.8   No results for input(s): LIPASE, AMYLASE in the last 168 hours. No results for input(s): AMMONIA in the last 168 hours. CBC: Recent Labs  Lab 05/31/17 0442 06/01/17 0449  WBC 7.7 8.5  NEUTROABS 4.9 6.2  HGB 12.9 13.5  HCT 40.3 42.8  MCV 97.1 98.4  PLT 301 256   Cardiac Enzymes: No results for input(s): CKTOTAL, CKMB, CKMBINDEX, TROPONINI in the last 168 hours. BNP: Invalid input(s): POCBNP CBG: Recent Labs  Lab 05/31/17 1143 05/31/17 1619 05/31/17 2102 06/01/17 0739 06/01/17 1144   GLUCAP 101* 91 143* 96 87   D-Dimer No results for input(s): DDIMER in the last 72 hours. Hgb A1c No results for input(s): HGBA1C in the last 72 hours. Lipid Profile No results for input(s): CHOL, HDL, LDLCALC, TRIG, CHOLHDL, LDLDIRECT in the last 72 hours. Thyroid function studies No results for input(s): TSH, T4TOTAL, T3FREE, THYROIDAB in the last 72 hours.  Invalid input(s): FREET3 Anemia work up No results for input(s): VITAMINB12, FOLATE, FERRITIN, TIBC, IRON, RETICCTPCT in the last 72 hours. Urinalysis    Component Value Date/Time   COLORURINE YELLOW 05/29/2017 1728   APPEARANCEUR CLEAR 05/29/2017 1728   LABSPEC 1.014 05/29/2017 1728   PHURINE 6.0 05/29/2017 1728   GLUCOSEU NEGATIVE 05/29/2017 1728   HGBUR NEGATIVE 05/29/2017 1728   BILIRUBINUR NEGATIVE 05/29/2017 1728   KETONESUR 5 (A) 05/29/2017 1728   PROTEINUR NEGATIVE 05/29/2017 1728   NITRITE NEGATIVE 05/29/2017 1728   LEUKOCYTESUR SMALL (A) 05/29/2017 1728   Sepsis Labs Invalid input(s): PROCALCITONIN,  WBC,  LACTICIDVEN Microbiology Recent Results (from the past 240 hour(s))  Urine Culture     Status: None   Collection Time: 05/29/17  5:28 PM  Result Value Ref Range Status   Specimen Description   Final    URINE, CLEAN CATCH Performed at [facility], [place] [address] [place] [address] [place], [place] [address]    Special Requests   Final    NONE Performed at [facility], [place] [address] [place], [place] [address]    Culture   Final    NO GROWTH Performed at [facility], [place] [address], [place], [place] [address]    Report Status 05/31/2017 FINAL  Final   Time coordinating discharge: 35 minutes  SIGNED:  Kerney Elbe, DO [facility] 06/06/2017, 11:33 AM Pager [phone]  If 7PM-7AM, please contact night-coverage [email or web address] Password [email or web address]

## 2017-06-07 DIAGNOSIS — E871 Hypo-osmolality and hyponatremia: Secondary | ICD-10-CM | POA: Diagnosis not present

## 2017-06-07 DIAGNOSIS — N644 Mastodynia: Secondary | ICD-10-CM | POA: Diagnosis not present

## 2017-06-07 DIAGNOSIS — R0602 Shortness of breath: Secondary | ICD-10-CM

## 2017-06-07 DIAGNOSIS — J9611 Chronic respiratory failure with hypoxia: Secondary | ICD-10-CM | POA: Diagnosis not present

## 2017-06-07 DIAGNOSIS — G3184 Mild cognitive impairment, so stated: Secondary | ICD-10-CM | POA: Diagnosis not present

## 2017-06-07 DIAGNOSIS — R0681 Apnea, not elsewhere classified: Secondary | ICD-10-CM | POA: Diagnosis not present

## 2017-06-07 DIAGNOSIS — J9612 Chronic respiratory failure with hypercapnia: Secondary | ICD-10-CM | POA: Diagnosis not present

## 2017-06-07 DIAGNOSIS — N390 Urinary tract infection, site not specified: Secondary | ICD-10-CM | POA: Diagnosis not present

## 2017-06-07 DIAGNOSIS — Z712 Person consulting for explanation of examination or test findings: Secondary | ICD-10-CM | POA: Diagnosis not present

## 2017-06-07 DIAGNOSIS — R1111 Vomiting without nausea: Secondary | ICD-10-CM | POA: Diagnosis not present

## 2017-06-07 DIAGNOSIS — Z6826 Body mass index (BMI) 26.0-26.9, adult: Secondary | ICD-10-CM | POA: Diagnosis not present

## 2017-06-07 DIAGNOSIS — G9340 Encephalopathy, unspecified: Secondary | ICD-10-CM | POA: Diagnosis not present

## 2017-06-07 DIAGNOSIS — F0391 Unspecified dementia with behavioral disturbance: Secondary | ICD-10-CM | POA: Diagnosis not present

## 2017-06-07 DIAGNOSIS — I10 Essential (primary) hypertension: Secondary | ICD-10-CM | POA: Diagnosis not present

## 2017-06-07 DIAGNOSIS — E119 Type 2 diabetes mellitus without complications: Secondary | ICD-10-CM | POA: Diagnosis not present

## 2017-06-07 DIAGNOSIS — F0390 Unspecified dementia without behavioral disturbance: Secondary | ICD-10-CM | POA: Diagnosis not present

## 2017-06-07 DIAGNOSIS — Z6827 Body mass index (BMI) 27.0-27.9, adult: Secondary | ICD-10-CM | POA: Diagnosis not present

## 2017-06-07 DIAGNOSIS — J449 Chronic obstructive pulmonary disease, unspecified: Secondary | ICD-10-CM | POA: Diagnosis not present

## 2017-06-07 DIAGNOSIS — R0902 Hypoxemia: Secondary | ICD-10-CM | POA: Diagnosis not present

## 2017-06-07 DIAGNOSIS — R5383 Other fatigue: Secondary | ICD-10-CM | POA: Diagnosis not present

## 2017-06-07 DIAGNOSIS — R197 Diarrhea, unspecified: Secondary | ICD-10-CM | POA: Diagnosis not present

## 2017-06-08 ENCOUNTER — Other Ambulatory Visit: Payer: Self-pay | Admitting: *Deleted

## 2017-06-08 NOTE — Patient Outreach (Signed)
[facility]) Care Management  06/08/2017  MALICIA BLASDEL September 28, 1945 [id number]   CSW had received call back from patient's husband, [name] who states that patient has been sick and in bed but asked that CSW call back next week to see if she's feeling better and is up to scheduling a home visit. CSW will try again on Wednesday, 3/13.    Raynaldo Opitz, LCSW [facility]  Clinical Social Worker cell #: [phone]

## 2017-06-08 NOTE — Patient Outreach (Signed)
[facility]) Care Management  06/08/2017  JOLEA DOLLE 1945/11/18 [id number]   CSW made an initial attempt to try and contact patient today to perform phone assessment, as well as assess and assist with social needs and services, without success. A HIPPA compliant message was left for patient on voicemail. CSW is currently awaiting a return call. CSW will make a second outreach attempt Monday, 3/11, if CSW does not receive a return call from patient in the meantime.    Raynaldo Opitz, LCSW [facility]  Clinical Social Worker cell #: [phone]

## 2017-06-11 ENCOUNTER — Ambulatory Visit: Payer: Self-pay | Admitting: *Deleted

## 2017-06-11 DIAGNOSIS — G9340 Encephalopathy, unspecified: Secondary | ICD-10-CM | POA: Diagnosis not present

## 2017-06-11 DIAGNOSIS — N390 Urinary tract infection, site not specified: Secondary | ICD-10-CM | POA: Diagnosis not present

## 2017-06-11 DIAGNOSIS — J449 Chronic obstructive pulmonary disease, unspecified: Secondary | ICD-10-CM | POA: Diagnosis not present

## 2017-06-11 DIAGNOSIS — E119 Type 2 diabetes mellitus without complications: Secondary | ICD-10-CM | POA: Diagnosis not present

## 2017-06-11 DIAGNOSIS — J9612 Chronic respiratory failure with hypercapnia: Secondary | ICD-10-CM | POA: Diagnosis not present

## 2017-06-11 DIAGNOSIS — J9611 Chronic respiratory failure with hypoxia: Secondary | ICD-10-CM | POA: Diagnosis not present

## 2017-06-12 ENCOUNTER — Inpatient Hospital Stay: Payer: Self-pay | Admitting: Adult Health

## 2017-06-13 ENCOUNTER — Other Ambulatory Visit: Payer: Self-pay | Admitting: *Deleted

## 2017-06-13 DIAGNOSIS — Z6826 Body mass index (BMI) 26.0-26.9, adult: Secondary | ICD-10-CM | POA: Diagnosis not present

## 2017-06-13 DIAGNOSIS — F0390 Unspecified dementia without behavioral disturbance: Secondary | ICD-10-CM | POA: Diagnosis not present

## 2017-06-13 DIAGNOSIS — R0902 Hypoxemia: Secondary | ICD-10-CM | POA: Diagnosis not present

## 2017-06-13 NOTE — Patient Outreach (Signed)
[facility]) Care Management  06/13/2017  Paula Andrews 1945-06-13 [id number]   CSW made an initial attempt to try and contact patient today to perform phone assessment, as well as assess and assist with social needs and services, without success. A HIPPA compliant message was left for patient on voicemail. CSW is currently awaiting a return call. CSW will make a second outreach attempt tomorrow if CSW does not receive a return call from patient in the meantime.    Raynaldo Opitz, LCSW [facility]  Clinical Social Worker cell #: [phone]

## 2017-06-14 ENCOUNTER — Other Ambulatory Visit: Payer: Self-pay | Admitting: *Deleted

## 2017-06-14 DIAGNOSIS — J449 Chronic obstructive pulmonary disease, unspecified: Secondary | ICD-10-CM | POA: Diagnosis not present

## 2017-06-14 DIAGNOSIS — E119 Type 2 diabetes mellitus without complications: Secondary | ICD-10-CM | POA: Diagnosis not present

## 2017-06-14 DIAGNOSIS — J9611 Chronic respiratory failure with hypoxia: Secondary | ICD-10-CM | POA: Diagnosis not present

## 2017-06-14 DIAGNOSIS — J9612 Chronic respiratory failure with hypercapnia: Secondary | ICD-10-CM | POA: Diagnosis not present

## 2017-06-14 DIAGNOSIS — G9340 Encephalopathy, unspecified: Secondary | ICD-10-CM | POA: Diagnosis not present

## 2017-06-14 DIAGNOSIS — N390 Urinary tract infection, site not specified: Secondary | ICD-10-CM | POA: Diagnosis not present

## 2017-06-14 NOTE — Patient Outreach (Signed)
[facility]) Care Management  06/14/2017  TIERA MENSINGER 02-25-1946 [id number]   CSW made second attempt to reach patient, but no answer. CSW left HIPPA compliant voicemail on patient's home #: [phone]. CSW will send unsuccessful outreach letter and will follow-up with patient in 10 days if no response.    Raynaldo Opitz, LCSW [facility]  Clinical Social Worker cell #: [phone]

## 2017-06-19 DIAGNOSIS — J9611 Chronic respiratory failure with hypoxia: Secondary | ICD-10-CM | POA: Diagnosis not present

## 2017-06-19 DIAGNOSIS — E119 Type 2 diabetes mellitus without complications: Secondary | ICD-10-CM | POA: Diagnosis not present

## 2017-06-19 DIAGNOSIS — G9340 Encephalopathy, unspecified: Secondary | ICD-10-CM | POA: Diagnosis not present

## 2017-06-19 DIAGNOSIS — J449 Chronic obstructive pulmonary disease, unspecified: Secondary | ICD-10-CM | POA: Diagnosis not present

## 2017-06-19 DIAGNOSIS — J9612 Chronic respiratory failure with hypercapnia: Secondary | ICD-10-CM | POA: Diagnosis not present

## 2017-06-19 DIAGNOSIS — N390 Urinary tract infection, site not specified: Secondary | ICD-10-CM | POA: Diagnosis not present

## 2017-06-25 DIAGNOSIS — G9340 Encephalopathy, unspecified: Secondary | ICD-10-CM | POA: Diagnosis not present

## 2017-06-25 DIAGNOSIS — G309 Alzheimer's disease, unspecified: Secondary | ICD-10-CM | POA: Diagnosis not present

## 2017-06-25 DIAGNOSIS — I639 Cerebral infarction, unspecified: Secondary | ICD-10-CM | POA: Diagnosis not present

## 2017-06-28 ENCOUNTER — Other Ambulatory Visit: Payer: Self-pay | Admitting: *Deleted

## 2017-06-28 DIAGNOSIS — J9612 Chronic respiratory failure with hypercapnia: Secondary | ICD-10-CM | POA: Diagnosis not present

## 2017-06-28 DIAGNOSIS — N390 Urinary tract infection, site not specified: Secondary | ICD-10-CM | POA: Diagnosis not present

## 2017-06-28 DIAGNOSIS — E119 Type 2 diabetes mellitus without complications: Secondary | ICD-10-CM | POA: Diagnosis not present

## 2017-06-28 DIAGNOSIS — G9340 Encephalopathy, unspecified: Secondary | ICD-10-CM | POA: Diagnosis not present

## 2017-06-28 DIAGNOSIS — J449 Chronic obstructive pulmonary disease, unspecified: Secondary | ICD-10-CM | POA: Diagnosis not present

## 2017-06-28 DIAGNOSIS — J9611 Chronic respiratory failure with hypoxia: Secondary | ICD-10-CM | POA: Diagnosis not present

## 2017-06-29 NOTE — Patient Outreach (Signed)
[facility]) Care Management  06/29/2017  Paula Andrews July 08, 1945 [id number]   CSW made a third and final attempt to try and contact patient today to perform phone assessment as well as assess and assist with social work needs and services, without success. CSW had mailed an unsuccessful outreach letter to patient's home, encouraging patient to contact CSW at their earliest convenience, if patient is interested in receiving social work services through [place] with Psychologist, educational. Required number of phone attempts will have been made and outreach letter mailed.   CSW will perform a case closure on patient, as unsuccessful attempts to reach patient. CSW will fax an update to patient's Primary Care Physician, Dr. Wende Neighbors to ensure that they are aware of CSW's involvement with patient's plan of care. CSW will submit a case closure request to [place] Management Assistant with [place].   Raynaldo Opitz, LCSW [facility]  Clinical Social Worker cell #: [phone]

## 2017-07-03 DIAGNOSIS — F0280 Dementia in other diseases classified elsewhere without behavioral disturbance: Secondary | ICD-10-CM | POA: Diagnosis not present

## 2017-07-03 DIAGNOSIS — Z7984 Long term (current) use of oral hypoglycemic drugs: Secondary | ICD-10-CM | POA: Diagnosis not present

## 2017-07-03 DIAGNOSIS — G301 Alzheimer's disease with late onset: Secondary | ICD-10-CM | POA: Diagnosis not present

## 2017-07-03 DIAGNOSIS — E119 Type 2 diabetes mellitus without complications: Secondary | ICD-10-CM | POA: Diagnosis not present

## 2017-07-03 DIAGNOSIS — I10 Essential (primary) hypertension: Secondary | ICD-10-CM | POA: Diagnosis not present

## 2017-07-04 DIAGNOSIS — N390 Urinary tract infection, site not specified: Secondary | ICD-10-CM | POA: Diagnosis not present

## 2017-07-04 DIAGNOSIS — J449 Chronic obstructive pulmonary disease, unspecified: Secondary | ICD-10-CM | POA: Diagnosis not present

## 2017-07-04 DIAGNOSIS — J9612 Chronic respiratory failure with hypercapnia: Secondary | ICD-10-CM | POA: Diagnosis not present

## 2017-07-04 DIAGNOSIS — J9611 Chronic respiratory failure with hypoxia: Secondary | ICD-10-CM | POA: Diagnosis not present

## 2017-07-04 DIAGNOSIS — G9340 Encephalopathy, unspecified: Secondary | ICD-10-CM | POA: Diagnosis not present

## 2017-07-04 DIAGNOSIS — E119 Type 2 diabetes mellitus without complications: Secondary | ICD-10-CM | POA: Diagnosis not present

## 2017-07-09 DIAGNOSIS — E1165 Type 2 diabetes mellitus with hyperglycemia: Secondary | ICD-10-CM | POA: Diagnosis not present

## 2017-07-09 DIAGNOSIS — I10 Essential (primary) hypertension: Secondary | ICD-10-CM | POA: Diagnosis not present

## 2017-07-09 DIAGNOSIS — E039 Hypothyroidism, unspecified: Secondary | ICD-10-CM | POA: Diagnosis not present

## 2017-07-09 DIAGNOSIS — Z6826 Body mass index (BMI) 26.0-26.9, adult: Secondary | ICD-10-CM | POA: Diagnosis not present

## 2017-07-09 DIAGNOSIS — E782 Mixed hyperlipidemia: Secondary | ICD-10-CM | POA: Diagnosis not present

## 2017-07-11 DIAGNOSIS — Z6826 Body mass index (BMI) 26.0-26.9, adult: Secondary | ICD-10-CM | POA: Diagnosis not present

## 2017-07-11 DIAGNOSIS — E039 Hypothyroidism, unspecified: Secondary | ICD-10-CM | POA: Diagnosis not present

## 2017-07-11 DIAGNOSIS — G3184 Mild cognitive impairment, so stated: Secondary | ICD-10-CM | POA: Diagnosis not present

## 2017-07-11 DIAGNOSIS — I10 Essential (primary) hypertension: Secondary | ICD-10-CM | POA: Diagnosis not present

## 2017-07-11 DIAGNOSIS — J9612 Chronic respiratory failure with hypercapnia: Secondary | ICD-10-CM | POA: Diagnosis not present

## 2017-07-11 DIAGNOSIS — N390 Urinary tract infection, site not specified: Secondary | ICD-10-CM | POA: Diagnosis not present

## 2017-07-11 DIAGNOSIS — G9340 Encephalopathy, unspecified: Secondary | ICD-10-CM | POA: Diagnosis not present

## 2017-07-11 DIAGNOSIS — J449 Chronic obstructive pulmonary disease, unspecified: Secondary | ICD-10-CM | POA: Diagnosis not present

## 2017-07-11 DIAGNOSIS — E119 Type 2 diabetes mellitus without complications: Secondary | ICD-10-CM | POA: Diagnosis not present

## 2017-07-11 DIAGNOSIS — J9611 Chronic respiratory failure with hypoxia: Secondary | ICD-10-CM | POA: Diagnosis not present

## 2017-07-11 DIAGNOSIS — E782 Mixed hyperlipidemia: Secondary | ICD-10-CM | POA: Diagnosis not present

## 2017-07-17 DIAGNOSIS — G9340 Encephalopathy, unspecified: Secondary | ICD-10-CM | POA: Diagnosis not present

## 2017-07-17 DIAGNOSIS — J9611 Chronic respiratory failure with hypoxia: Secondary | ICD-10-CM | POA: Diagnosis not present

## 2017-07-17 DIAGNOSIS — J9612 Chronic respiratory failure with hypercapnia: Secondary | ICD-10-CM | POA: Diagnosis not present

## 2017-07-17 DIAGNOSIS — J449 Chronic obstructive pulmonary disease, unspecified: Secondary | ICD-10-CM | POA: Diagnosis not present

## 2017-07-17 DIAGNOSIS — E119 Type 2 diabetes mellitus without complications: Secondary | ICD-10-CM | POA: Diagnosis not present

## 2017-07-17 DIAGNOSIS — N390 Urinary tract infection, site not specified: Secondary | ICD-10-CM | POA: Diagnosis not present

## 2017-07-31 DIAGNOSIS — J449 Chronic obstructive pulmonary disease, unspecified: Secondary | ICD-10-CM | POA: Diagnosis not present

## 2017-07-31 DIAGNOSIS — N390 Urinary tract infection, site not specified: Secondary | ICD-10-CM | POA: Diagnosis not present

## 2017-07-31 DIAGNOSIS — J9612 Chronic respiratory failure with hypercapnia: Secondary | ICD-10-CM | POA: Diagnosis not present

## 2017-07-31 DIAGNOSIS — G9340 Encephalopathy, unspecified: Secondary | ICD-10-CM | POA: Diagnosis not present

## 2017-07-31 DIAGNOSIS — J9611 Chronic respiratory failure with hypoxia: Secondary | ICD-10-CM | POA: Diagnosis not present

## 2017-07-31 DIAGNOSIS — E119 Type 2 diabetes mellitus without complications: Secondary | ICD-10-CM | POA: Diagnosis not present

## 2017-08-06 ENCOUNTER — Ambulatory Visit (INDEPENDENT_AMBULATORY_CARE_PROVIDER_SITE_OTHER): Payer: Medicare Other | Admitting: Adult Health

## 2017-08-06 ENCOUNTER — Encounter: Payer: Self-pay | Admitting: Adult Health

## 2017-08-06 VITALS — BP 124/80 | HR 74 | Ht 64.0 in | Wt 153.2 lb

## 2017-08-06 DIAGNOSIS — J9612 Chronic respiratory failure with hypercapnia: Secondary | ICD-10-CM

## 2017-08-06 DIAGNOSIS — G4733 Obstructive sleep apnea (adult) (pediatric): Secondary | ICD-10-CM

## 2017-08-06 DIAGNOSIS — J438 Other emphysema: Secondary | ICD-10-CM | POA: Diagnosis not present

## 2017-08-06 DIAGNOSIS — J9611 Chronic respiratory failure with hypoxia: Secondary | ICD-10-CM | POA: Diagnosis not present

## 2017-08-06 NOTE — Progress Notes (Signed)
@Patient  ID: Williemae Area, female    DOB: 02-06-1946, 72 y.o.   MRN: [id number]    Referring provider: Celene Squibb, MD  HPI: 72 year old female former smoker (quit 2003) seen in the hospital for pulmonary consult for hypoxic and hypercarbic respiratory failure felt to be secondary to probable COPD, OSA and OHS PMH DM , HTN , cholesterol .   TEST  CT chest 2/26IMPRESSION: 1. No acute intrathoracic pathology. No CT evidence of pulmonary embolism. 2. Mild cardiomegaly. 3. Aortic Atherosclerosis (ICD10-I70.0) and Emphysema (ICD10-J43.9). Mild emphysema   Echocardiogram 2/28>> Mild LVH, EF 65-70%. Grade 1 diastolic dysfunction  ABG ph 7.24, Pco2 81 2019   08/06/2017 Follow up: COPD , OHS, OSA  Patient returns for a post hospital follow-up.  Patient was recently seen for a pulmonary consult during her hospitalization March 2018.  Patient was found to have acute hypercarbic and hypoxic respiratory failure felt to be possibly secondary to underlying COPD and OSA and OHS.  CO2 was elevated on admission at 81.  Patient does have some underlying dementia but had worsening mental status changes  during hospitalization.  CT chest was negative for PE. Patient was started on BiPAP support.  And discharged on trilogy vent at bedtime.  Started on DuoNeb 4 times daily. She is on ACE inhibitor denies increased cough .  Since discharge patient is feeling better , dyspnea is not as back .  Says Trilogy mask does not fit that great , discussed importance of wearing each night .  PFT on showed FEV1 68%, ratio 74, FVC 69, DLCO, no BD response . Mid flow reversibility .   Patient is accompanied by her daughters and husband today.  No previous diagnosis of COPD . Had some dyspnea for 1-2 years prior to admission .  No use of sedating meds, narcotics or anxiety meds.  No etoh use.  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  Says she can not go for sleep study , memory issues , she does not want to stay .  She has  mild to moderate dementia , says memory issues for last few years.   No Known Allergies  Immunization History  Administered Date(s) Administered  . Influenza, High Dose Seasonal PF 01/01/2017  . Pneumococcal Polysaccharide-23 04/04/2015  . Tdap 10/29/2011    Past Medical History:  Diagnosis Date  . Dementia   . Diabetes mellitus   . Hypertension   . Thyroid disease     Tobacco History: Social History   Tobacco Use  Smoking Status Former Smoker  . Packs/day: 1.00  . Years: 41.00  . Pack years: 41.00  . Types: Cigarettes  . Last attempt to quit: 04/03/2001  . Years since quitting: 16.3  Smokeless Tobacco Never Used   Counseling given: Not Answered   Outpatient Encounter Medications as of 08/06/2017  Medication Sig  . albuterol (PROVENTIL HFA;VENTOLIN HFA) 108 (90 Base) MCG/ACT inhaler Inhale 2 puffs into the lungs every 6 (six) hours as needed for wheezing or shortness of breath.  Marland Kitchen amLODipine (NORVASC) 5 MG tablet Take 1 tablet (5 mg total) by mouth daily.  . Ascorbic Acid (VITAMIN C) 1000 MG tablet Take 1,000 mg by mouth daily.  . Calcium-Vitamin D 600-200 MG-UNIT per tablet Take 1 tablet by mouth daily.  Marland Kitchen donepezil (ARICEPT) 5 MG tablet Take 5 mg by mouth at bedtime.  Marland Kitchen guaiFENesin (MUCINEX) 600 MG 12 hr tablet Take 2 tablets (1,200 mg total) by mouth 2 (two) times daily.  Marland Kitchen ipratropium-albuterol (  DUONEB) 0.5-2.5 (3) MG/3ML SOLN Take 3 mLs by nebulization every 6 (six) hours as needed.  Marland Kitchen levothyroxine (SYNTHROID, LEVOTHROID) 75 MCG tablet Take 75 mcg by mouth daily.   Marland Kitchen lisinopril (PRINIVIL,ZESTRIL) 5 MG tablet Take 5 mg by mouth daily.   . Memantine HCl ER 21 MG CP24 Take 1 capsule by mouth daily.  . metFORMIN (GLUCOPHAGE) 500 MG tablet Take 250 mg by mouth 2 (two) times daily with a meal.   . metoprolol succinate (TOPROL-XL) 25 MG 24 hr tablet Take 25 mg by mouth every morning.  . ondansetron (ZOFRAN) 4 MG tablet Take 4 mg by mouth every 8 (eight) hours as needed for  nausea or vomiting.   . pravastatin (PRAVACHOL) 20 MG tablet Take 20 mg by mouth daily.    No facility-administered encounter medications on file as of 08/06/2017.      Review of Systems  Constitutional:   No  weight loss, night sweats,  Fevers, chills +, fatigue, or  Lassitude.+ dementia   HEENT:   No headaches,  Difficulty swallowing,  Tooth/dental problems, or  Sore throat,                No sneezing, itching, ear ache, nasal congestion, post nasal drip,   CV:  No chest pain,  Orthopnea, PND, swelling in lower extremities, anasarca, dizziness, palpitations, syncope.   GI  No heartburn, indigestion, abdominal pain, nausea, vomiting, diarrhea, change in bowel habits, loss of appetite, bloody stools.   Resp:    No chest wall deformity  Skin: no rash or lesions.  GU: no dysuria, change in color of urine, no urgency or frequency.  No flank pain, no hematuria   MS:  No joint pain or swelling.  No decreased range of motion.  No back pain.    Physical Exam  BP 124/80 (BP Location: Right Arm, Cuff Size: Normal)   Pulse 74   Ht 5\' 4"  (1.626 m)   Wt 153 lb 3.2 oz (69.5 kg)   SpO2 92%   BMI 26.30 kg/m   GEN: A/Ox3; pleasant , NAD, elderly    HEENT:  [place]/AT,  EACs-clear, TMs-wnl, NOSE-clear, THROAT-clear, no lesions, no postnasal drip or exudate noted.   NECK:  Supple w/ fair ROM; no JVD; normal carotid impulses w/o bruits; no thyromegaly or nodules palpated; no lymphadenopathy.    RESP  Clear  P & A; w/o, wheezes/ rales/ or rhonchi. no accessory muscle use, no dullness to percussion  CARD:  RRR, no m/r/g, no peripheral edema, pulses intact, no cyanosis or clubbing.  GI:   Soft & nt; nml bowel sounds; no organomegaly or masses detected.   Musco: Warm bil, no deformities or joint swelling noted.   Neuro: alert, no focal deficits noted.    Skin: Warm, no lesions or rashes    Lab Results:  CBC    Component Value Date/Time   WBC 8.5 06/01/2017 0449   RBC 4.35  06/01/2017 0449   HGB 13.5 06/01/2017 0449   HCT 42.8 06/01/2017 0449   PLT 256 06/01/2017 0449   MCV 98.4 06/01/2017 0449   MCH 31.0 06/01/2017 0449   MCHC 31.5 06/01/2017 0449   RDW 13.1 06/01/2017 0449   LYMPHSABS 1.3 06/01/2017 0449   MONOABS 0.7 06/01/2017 0449   EOSABS 0.2 06/01/2017 0449   BASOSABS 0.1 06/01/2017 0449    BMET    Component Value Date/Time   NA 140 06/01/2017 0449   K 4.4 06/01/2017 0449   CL 100 (L)  06/01/2017 0449   CO2 30 06/01/2017 0449   GLUCOSE 119 (H) 06/01/2017 0449   BUN 11 06/01/2017 0449   CREATININE 0.64 06/01/2017 0449   CALCIUM 8.9 06/01/2017 0449   GFRNONAA >60 06/01/2017 0449   GFRAA >60 06/01/2017 0449    BNP    Component Value Date/Time   BNP 41.4 05/31/2017 1648    ProBNP No results found for: PROBNP  Imaging: No results found.   Assessment & Plan:   No problem-specific Assessment & Plan notes found for this encounter.     Rexene Edison, NP 08/06/2017

## 2017-08-06 NOTE — Patient Instructions (Addendum)
Use Duoneb Twice daily  .  Use Trilogy machine At bedtime  . Try to wear for at least 4 hr.  New mask full face dream wear .  Follow up with Dr Vaughan Browner in 2 months and As needed

## 2017-08-07 DIAGNOSIS — J441 Chronic obstructive pulmonary disease with (acute) exacerbation: Secondary | ICD-10-CM | POA: Insufficient documentation

## 2017-08-07 NOTE — Assessment & Plan Note (Signed)
Emphysema on CT , may have component of asthma  No significant obstruction on PFT. More restriction changes noted. Some mid flow reversibility  Will cont on duoneb for now   Plan  Patient Instructions  Use Duoneb Twice daily  .  Use Trilogy machine At bedtime  . Try to wear for at least 4 hr.  New mask full face dream wear .  Follow up with Dr Vaughan Browner in 2 months and As needed

## 2017-08-07 NOTE — Assessment & Plan Note (Signed)
Hypercarbic /Hypoxic RF ,  Unable to do sleep study due to dementia/anxiety  Cont on TRILOGY At bedtime   Change mask for comfort   Plan  Patient Instructions  Use Duoneb Twice daily  .  Use Trilogy machine At bedtime  . Try to wear for at least 4 hr.  New mask full face dream wear .  Follow up with Dr Vaughan Browner in 2 months and As needed

## 2017-09-17 DIAGNOSIS — I10 Essential (primary) hypertension: Secondary | ICD-10-CM | POA: Diagnosis not present

## 2017-09-17 DIAGNOSIS — G309 Alzheimer's disease, unspecified: Secondary | ICD-10-CM | POA: Diagnosis not present

## 2017-10-04 DIAGNOSIS — R1111 Vomiting without nausea: Secondary | ICD-10-CM | POA: Diagnosis not present

## 2017-10-04 DIAGNOSIS — R197 Diarrhea, unspecified: Secondary | ICD-10-CM | POA: Diagnosis not present

## 2017-10-04 DIAGNOSIS — R0902 Hypoxemia: Secondary | ICD-10-CM | POA: Diagnosis not present

## 2017-10-04 DIAGNOSIS — Z6824 Body mass index (BMI) 24.0-24.9, adult: Secondary | ICD-10-CM | POA: Diagnosis not present

## 2017-10-04 DIAGNOSIS — I10 Essential (primary) hypertension: Secondary | ICD-10-CM | POA: Diagnosis not present

## 2017-10-04 DIAGNOSIS — N390 Urinary tract infection, site not specified: Secondary | ICD-10-CM | POA: Diagnosis not present

## 2017-10-04 DIAGNOSIS — E871 Hypo-osmolality and hyponatremia: Secondary | ICD-10-CM | POA: Diagnosis not present

## 2017-10-04 DIAGNOSIS — R5383 Other fatigue: Secondary | ICD-10-CM | POA: Diagnosis not present

## 2017-10-04 DIAGNOSIS — R112 Nausea with vomiting, unspecified: Secondary | ICD-10-CM | POA: Diagnosis not present

## 2017-10-04 DIAGNOSIS — Z6826 Body mass index (BMI) 26.0-26.9, adult: Secondary | ICD-10-CM | POA: Diagnosis not present

## 2017-10-04 DIAGNOSIS — R0681 Apnea, not elsewhere classified: Secondary | ICD-10-CM | POA: Diagnosis not present

## 2018-01-02 ENCOUNTER — Emergency Department (HOSPITAL_COMMUNITY)
Admission: EM | Admit: 2018-01-02 | Discharge: 2018-01-03 | Disposition: A | Discharge status: Other Acute Inpatient Hospital | Payer: Medicare Other | Attending: Emergency Medicine | Admitting: Emergency Medicine

## 2018-01-02 ENCOUNTER — Other Ambulatory Visit: Payer: Self-pay

## 2018-01-02 ENCOUNTER — Encounter (HOSPITAL_COMMUNITY): Payer: Self-pay | Admitting: Emergency Medicine

## 2018-01-02 DIAGNOSIS — E119 Type 2 diabetes mellitus without complications: Secondary | ICD-10-CM | POA: Insufficient documentation

## 2018-01-02 DIAGNOSIS — Z87891 Personal history of nicotine dependence: Secondary | ICD-10-CM | POA: Diagnosis not present

## 2018-01-02 DIAGNOSIS — F0281 Dementia in other diseases classified elsewhere with behavioral disturbance: Secondary | ICD-10-CM | POA: Diagnosis not present

## 2018-01-02 DIAGNOSIS — Z046 Encounter for general psychiatric examination, requested by authority: Secondary | ICD-10-CM | POA: Diagnosis present

## 2018-01-02 DIAGNOSIS — I10 Essential (primary) hypertension: Secondary | ICD-10-CM | POA: Insufficient documentation

## 2018-01-02 DIAGNOSIS — R45851 Suicidal ideations: Secondary | ICD-10-CM | POA: Insufficient documentation

## 2018-01-02 DIAGNOSIS — R45850 Homicidal ideations: Secondary | ICD-10-CM | POA: Diagnosis not present

## 2018-01-02 DIAGNOSIS — G309 Alzheimer's disease, unspecified: Secondary | ICD-10-CM | POA: Diagnosis not present

## 2018-01-02 LAB — RAPID URINE DRUG SCREEN, HOSP PERFORMED
AMPHETAMINES: NOT DETECTED
BENZODIAZEPINES: NOT DETECTED
Barbiturates: NOT DETECTED
Cocaine: NOT DETECTED
OPIATES: NOT DETECTED
Tetrahydrocannabinol: NOT DETECTED

## 2018-01-02 LAB — URINALYSIS, ROUTINE W REFLEX MICROSCOPIC
Bacteria, UA: NONE SEEN
Bilirubin Urine: NEGATIVE
GLUCOSE, UA: NEGATIVE mg/dL
Hgb urine dipstick: NEGATIVE
KETONES UR: NEGATIVE mg/dL
Nitrite: NEGATIVE
PH: 5 (ref 5.0–8.0)
Protein, ur: NEGATIVE mg/dL
SPECIFIC GRAVITY, URINE: 1.011 (ref 1.005–1.030)

## 2018-01-02 LAB — CBC WITH DIFFERENTIAL/PLATELET
BASOS ABS: 0.1 10*3/uL (ref 0.0–0.1)
BASOS PCT: 1 %
EOS ABS: 0.1 10*3/uL (ref 0.0–0.7)
EOS PCT: 2 %
HCT: 41.1 % (ref 36.0–46.0)
HEMOGLOBIN: 13.3 g/dL (ref 12.0–15.0)
LYMPHS ABS: 2.3 10*3/uL (ref 0.7–4.0)
LYMPHS PCT: 30 %
MCH: 30.6 pg (ref 26.0–34.0)
MCHC: 32.4 g/dL (ref 30.0–36.0)
MCV: 94.5 fL (ref 78.0–100.0)
Monocytes Absolute: 0.5 10*3/uL (ref 0.1–1.0)
Monocytes Relative: 6 %
NEUTROS ABS: 4.8 10*3/uL (ref 1.7–7.7)
NEUTROS PCT: 61 %
PLATELETS: 207 10*3/uL (ref 150–400)
RBC: 4.35 MIL/uL (ref 3.87–5.11)
RDW: 13.1 % (ref 11.5–15.5)
WBC: 7.8 10*3/uL (ref 4.0–10.5)

## 2018-01-02 LAB — BASIC METABOLIC PANEL
ANION GAP: 10 (ref 5–15)
BUN: 18 mg/dL (ref 8–23)
CHLORIDE: 97 mmol/L — AB (ref 98–111)
CO2: 28 mmol/L (ref 22–32)
Calcium: 9.1 mg/dL (ref 8.9–10.3)
Creatinine, Ser: 1.08 mg/dL — ABNORMAL HIGH (ref 0.44–1.00)
GFR, EST AFRICAN AMERICAN: 58 mL/min — AB (ref >60)
GFR, EST NON AFRICAN AMERICAN: 50 mL/min — AB (ref >60)
Glucose, Bld: 197 mg/dL — ABNORMAL HIGH (ref 70–99)
POTASSIUM: 3.5 mmol/L (ref 3.5–5.1)
SODIUM: 135 mmol/L (ref 135–145)

## 2018-01-02 LAB — ETHANOL

## 2018-01-02 MED ORDER — ALBUTEROL SULFATE HFA 108 (90 BASE) MCG/ACT IN AERS: 2.0000 | INHALATION_SPRAY | Freq: Four times a day (QID) | RESPIRATORY_TRACT | Status: DC | PRN

## 2018-01-02 MED ORDER — METOPROLOL SUCCINATE ER 25 MG PO TB24
25.0000 mg | ORAL_TABLET | Freq: Every morning | ORAL | Status: DC
  Administered 2018-01-03: 25 mg via ORAL
  Filled 2018-01-02: qty 1

## 2018-01-02 MED ORDER — AMLODIPINE BESYLATE 5 MG PO TABS
5.0000 mg | ORAL_TABLET | Freq: Every day | ORAL | Status: DC
  Administered 2018-01-02 – 2018-01-03 (×2): 5 mg via ORAL
  Filled 2018-01-02 (×2): qty 1

## 2018-01-02 MED ORDER — LEVOTHYROXINE SODIUM 50 MCG PO TABS
75.0000 ug | ORAL_TABLET | Freq: Every day | ORAL | Status: DC
  Administered 2018-01-02 – 2018-01-03 (×2): 75 ug via ORAL
  Filled 2018-01-02 (×2): qty 2

## 2018-01-02 MED ORDER — PRAVASTATIN SODIUM 10 MG PO TABS
20.0000 mg | ORAL_TABLET | Freq: Every day | ORAL | Status: DC
  Administered 2018-01-03: 20 mg via ORAL
  Filled 2018-01-02 (×2): qty 1

## 2018-01-02 MED ORDER — LISINOPRIL 5 MG PO TABS
5.0000 mg | ORAL_TABLET | Freq: Every day | ORAL | Status: DC
  Administered 2018-01-02 – 2018-01-03 (×2): 5 mg via ORAL
  Filled 2018-01-02 (×2): qty 1

## 2018-01-02 MED ORDER — DONEPEZIL HCL 5 MG PO TABS
5.0000 mg | ORAL_TABLET | Freq: Once | ORAL | Status: AC
  Administered 2018-01-02: 5 mg via ORAL
  Filled 2018-01-02 (×2): qty 1

## 2018-01-02 MED ORDER — ONDANSETRON HCL 4 MG PO TABS: 4.0000 mg | ORAL_TABLET | Freq: Three times a day (TID) | ORAL | Status: DC | PRN

## 2018-01-02 NOTE — BH Assessment (Addendum)
Tele Assessment Note   Patient Name: Paula Andrews MRN: [id number] Referring Physician: Dr. Gerlene Fee Location of Patient: [facility] Location of Provider: [place] is an 72 y.o. female.  -Clinician reviewed note by Dr. Sedonia Small.  Per family, patient has been struggling with progressively worsening dementia for the past several years, getting much worse over the past few months.  At times being very forgetful, stating that she wants to go home but she is in the same home that she is lived for 50 years.  Multiple times threatening to kill her husband, threatening to kill herself if she were ever placed in a care facility other than her home.  Yesterday stated that she was going to go home, even though she was already home.  Left the house with the car, was missing for several hours.  Likely returned unharmed.  Attempted to leave again today, family issued IVC, bringing her here today.  Patient is accompanied by her daughter.  Yesterday (10/01) patient had left the house "because people were arguing and telling me what to do."  She admits to driving "around the block" but daughter said she was gone a long time.  Patient admits to having dementia and getting lost coming back home.    According to daughter patient had threatened to "end her life in the pond."  Patient does not remember saying this.  She has also has been saying she would kill daughter and husband.  Patient says she does not remember saying these types of things.  Patient acts surprised that she would make those types of threats.  Patient has no previous attempts to harm herself or others.  Daughter said that there were guns in the home but they are secured.    Patient has been leaving the house at night and wandering.  The patient is incredulous about this saying she only gets up to go to bathroom.  Daughter says that patient has been videotaped leaving the house at night.  Daughter said she did not think  mother is getting her medications as prescribed because patient's husband has lung issues and cannot care for her adequately.  Patient has no previous hx of inpatient or outpatient psychiatric care.  According to daughter, the PCP has advised family to look into a nursing facility for patient.    -Clinician discussed patient care with Lindon Romp, [place].  He recommended geropsych placement for patient.  Clinician informed nurse Jovita Kussmaul of disposition.  TTS to seek placement.  Diagnosis: Dementia  Past Medical History:  Past Medical History:  Diagnosis Date  . Dementia ([place])   . Diabetes mellitus   . Hypertension   . Thyroid disease     Past Surgical History:  Procedure Laterality Date  . ABDOMINAL HYSTERECTOMY      Family History:  Family History  Problem Relation Age of Onset  . Diabetes Unknown     Social History:  reports that she quit smoking about 16 years ago. Her smoking use included cigarettes. She has a 41.00 pack-year smoking history. She has never used smokeless tobacco. She reports that she does not drink alcohol or use drugs.  Additional Social History:  Alcohol / Drug Use Pain Medications: See PTA medication list Prescriptions: See PTA medication list Over the Counter: See PTA medication list History of alcohol / drug use?: No history of alcohol / drug abuse  CIWA: CIWA-Ar BP: (!) 176/66 Pulse Rate: 62 COWS:    Allergies: No  Known Allergies  Home Medications:  (Not in a hospital admission)  OB/GYN Status:  No LMP recorded. Patient has had a hysterectomy.  General Assessment Data Location of Assessment: [facility] TTS Assessment: In system Is this a Tele or Face-to-Face Assessment?: Tele Assessment Is this an Initial Assessment or a Re-assessment for this encounter?: Initial Assessment Patient Accompanied by:: Other(daughter) Language Other than English: No Living Arrangements: Other (Comment)(Living at home with husband.) What gender do you  identify as?: Female Marital status: Married [place] name: Little Pregnancy Status: No Living Arrangements: Spouse/significant other Can pt return to current living arrangement?: Yes(PCP has advised looking into a assisted living facility.) Admission Status: Voluntary Petitioner: Family member Is patient capable of signing voluntary admission?: No Referral Source: Self/Family/Friend Insurance typeFurniture conservator/restorer     Crisis Care Plan Living Arrangements: Spouse/significant other Name of Psychiatrist: None Name of Therapist: None  Education Status Is patient currently in school?: No Is the patient employed, unemployed or receiving disability?: Unemployed(Retired)  Risk to self with the past 6 months Suicidal Ideation: No-Not Currently/Within Last 6 Months Has patient been a risk to self within the past 6 months prior to admission? : No Suicidal Intent: No Has patient had any suicidal intent within the past 6 months prior to admission? : No Is patient at risk for suicide?: No Suicidal Plan?: No(Had made a statement about drowning but can't remember it.) Has patient had any suicidal plan within the past 6 months prior to admission? : No Access to Means: Yes Specify Access to Suicidal Means: [place] the house What has been your use of drugs/alcohol within the last 12 months?: N/A Previous Attempts/Gestures: No How many times?: 0 Other Self Harm Risks: None Triggers for Past Attempts: None known Intentional Self Injurious Behavior: None Family Suicide History: No Recent stressful life event(s): Recent negative physical changes(Advancing dementia) Persecutory voices/beliefs?: No Depression: No Depression Symptoms: (Pt denies depressive symptoms) Substance abuse history and/or treatment for substance abuse?: No Suicide prevention information given to non-admitted patients: Not applicable  Risk to Others within the past 6 months Homicidal Ideation: No Does patient have any lifetime  risk of violence toward others beyond the six months prior to admission? : No Thoughts of Harm to Others: Yes-Currently Present Comment - Thoughts of Harm to Others: Pt had threatened to kill husband. Current Homicidal Intent: No Current Homicidal Plan: No Access to Homicidal Means: No Identified Victim: husband, daughter History of harm to others?: No Assessment of Violence: None Noted Violent Behavior Description: None reported Does patient have access to weapons?: Yes (Comment)(Guns are secured.) Criminal Charges Pending?: No Does patient have a court date: No Is patient on probation?: No  Psychosis Hallucinations: Visual(Has seen three men in the house.) Delusions: None noted  Mental Status Report Appearance/Hygiene: Unremarkable, In scrubs Eye Contact: Fair Motor Activity: Unremarkable Speech: Soft, Logical/coherent Level of Consciousness: Alert Mood: Depressed Affect: Depressed Anxiety Level: Minimal Thought Processes: Irrelevant Judgement: Impaired Orientation: Person, Place Obsessive Compulsive Thoughts/Behaviors: None  Cognitive Functioning Concentration: Decreased Memory: Recent Impaired, Remote Impaired Is patient IDD: No Insight: Fair Impulse Control: Poor Appetite: Poor Have you had any weight changes? : Loss Amount of the weight change? (lbs): (Pt unsure.) Sleep: Decreased Total Hours of Sleep: (Pt has been getting up and wandering at night.  Doesn't reme) Vegetative Symptoms: None  ADLScreening [facility] Assessment Services) Patient's cognitive ability adequate to safely complete daily activities?: Yes Patient able to express need for assistance with ADLs?: Yes Independently performs ADLs?: Yes (appropriate for  developmental age)  Prior Inpatient Therapy Prior Inpatient Therapy: No  Prior Outpatient Therapy Prior Outpatient Therapy: No Does patient have an ACCT team?: No Does patient have Intensive In-House Services?  : No Does patient have [facility]  services? : No Does patient have P4CC services?: No  ADL Screening (condition at time of admission) Patient's cognitive ability adequate to safely complete daily activities?: Yes Is the patient deaf or have difficulty hearing?: No Does the patient have difficulty seeing, even when wearing glasses/contacts?: No(Has eyeglasses.) Does the patient have difficulty concentrating, remembering, or making decisions?: Yes Patient able to express need for assistance with ADLs?: Yes Does the patient have difficulty dressing or bathing?: No Independently performs ADLs?: Yes (appropriate for developmental age) Does the patient have difficulty walking or climbing stairs?: No(Will wander at night.) Weakness of Legs: Both(Some trouble getting up and down steps.) Weakness of Arms/Hands: None       Abuse/Neglect Assessment (Assessment to be complete while patient is alone) Abuse/Neglect Assessment Can Be Completed: Yes Physical Abuse: Denies Verbal Abuse: Yes, past (Comment)(Pt unsure.) Sexual Abuse: Denies Exploitation of patient/patient's resources: Denies Self-Neglect: Denies     Regulatory affairs officer (For Healthcare) Does Patient Have a Medical Advance Directive?: Yes Does patient want to make changes to medical advance directive?: No - Patient declined Type of Advance Directive: Healthcare Power of Attorney, Living will [place] in Chart?: No - copy requested Copy of Living Will in Chart?: No - copy requested Would patient like information on creating a medical advance directive?: No - Patient declined          Disposition:  Disposition Initial Assessment Completed for this Encounter: Yes Patient referred to: Other (Comment)(geropsych facilities)  This service was provided via telemedicine using a 2-way, interactive audio and video technology.  Names of all persons participating in this telemedicine service and their role in this encounter. Name: Paula Andrews Role: patient  Name: Glori Bickers Role: daughter  Name: Curlene Dolphin, M.S. LCAS QP Role: clinician  Name:  Role:     Raymondo Band 01/02/2018 10:35 PM

## 2018-01-02 NOTE — ED Triage Notes (Signed)
Patient has hx of dementia, left the home yesterday and law enforcement were called to find her. Patient eventually found her way home. Patient has made threats against family members. Has expressed SI although she "doesn't remember." PCP aware of situation. IVC papers in progress. Patient is confused and cannot answer questions appropriately.

## 2018-01-02 NOTE — ED Provider Notes (Signed)
[facility] Emergency Department Provider Note MRN:  [id number]  Arrival date & time: 01/02/18     Chief Complaint   Altered mental status History of Present Illness   Paula Andrews is a 72 y.o. year-old female with a history of dementia, hypertension, diabetes presenting to the ED with chief complaint of altered mental status.  Per family, patient has been struggling with progressively worsening dementia for the past several years, getting much worse over the past few months.  At times being very forgetful, stating that she wants to go home but she is in the same home that she is lived for 50 years.  Multiple times threatening to kill her husband, threatening to kill herself if she were ever placed in a care facility other than her home.  Yesterday stated that she was going to go home, even though she was already home.  Left the house with the car, was missing for several hours.  Likely returned unharmed.  Attempted to leave again today, family issued IVC, bringing her here today.  Patiently currently denies any pain, no recent dysuria, no recent falls or head trauma.  Review of Systems  A complete 10 system review of systems was obtained and all systems are negative except as noted in the HPI and PMH.   Patient's Health History    Past Medical History:  Diagnosis Date  . Dementia ([place])   . Diabetes mellitus   . Hypertension   . Thyroid disease     Past Surgical History:  Procedure Laterality Date  . ABDOMINAL HYSTERECTOMY      Family History  Problem Relation Age of Onset  . Diabetes Unknown     Social History   Socioeconomic History  . Marital status: Married    Spouse name: Not on file  . Number of children: Not on file  . Years of education: 62  . Highest education level: Not on file  Occupational History    Employer: RETIRED  Social Needs  . Financial resource strain: Not on file  . Food insecurity:    Worry: Not on file    Inability: Not on  file  . Transportation needs:    Medical: Not on file    Non-medical: Not on file  Tobacco Use  . Smoking status: Former Smoker    Packs/day: 1.00    Years: 41.00    Pack years: 41.00    Types: Cigarettes    Last attempt to quit: 04/03/2001    Years since quitting: 16.7  . Smokeless tobacco: Never Used  Substance and Sexual Activity  . Alcohol use: No  . Drug use: No  . Sexual activity: Not on file  Lifestyle  . Physical activity:    Days per week: Not on file    Minutes per session: Not on file  . Stress: Not on file  Relationships  . Social connections:    Talks on phone: Not on file    Gets together: Not on file    Attends religious service: Not on file    Active member of club or organization: Not on file    Attends meetings of clubs or organizations: Not on file    Relationship status: Not on file  . Intimate partner violence:    Fear of current or ex partner: Not on file    Emotionally abused: Not on file    Physically abused: Not on file    Forced sexual activity: Not on file  Other  Topics Concern  . Not on file  Social History Narrative  . Not on file     Physical Exam  Vital Signs and Nursing Notes reviewed Vitals:   01/02/18 1942  BP: (!) 176/66  Pulse: 62  Resp: 18  Temp: 98.1 F (36.7 C)  SpO2: 92%    CONSTITUTIONAL: Well-appearing, NAD NEURO:  Alert and oriented to person place and events, no focal deficits EYES:  eyes equal and reactive ENT/NECK:  no LAD, no JVD CARDIO: Regular rate, well-perfused, normal S1 and S2 PULM:  CTAB no wheezing or rhonchi GI/GU:  normal bowel sounds, non-distended, non-tender MSK/SPINE:  No gross deformities, no edema SKIN:  no rash, atraumatic PSYCH:  Appropriate speech and behavior  Diagnostic and Interventional Summary    EKG Interpretation  Date/Time:  Wednesday January 02 2018 19:59:06 EDT Ventricular Rate:  63 PR Interval:    QRS Duration: 87 QT Interval:  430 QTC Calculation: 441 R Axis:   56 Text  Interpretation:  Sinus rhythm Confirmed by Gerlene Fee [phone]) on 01/02/2018 9:13:03 PM      Labs Reviewed  URINALYSIS, ROUTINE W REFLEX MICROSCOPIC - Abnormal; Notable for the following components:      Result Value   Leukocytes, UA SMALL (*)    All other components within normal limits  BASIC METABOLIC PANEL - Abnormal; Notable for the following components:   Chloride 97 (*)    Glucose, Bld 197 (*)    Creatinine, Ser 1.08 (*)    GFR calc non Af Amer 50 (*)    GFR calc Af Amer 58 (*)    All other components within normal limits  CBC WITH DIFFERENTIAL/PLATELET  ETHANOL  RAPID URINE DRUG SCREEN, HOSP PERFORMED    No orders to display    Medications  albuterol (PROVENTIL HFA;VENTOLIN HFA) 108 (90 Base) MCG/ACT inhaler 2 puff (has no administration in time range)  amLODipine (NORVASC) tablet 5 mg (has no administration in time range)  donepezil (ARICEPT) tablet 5 mg (has no administration in time range)  levothyroxine (SYNTHROID, LEVOTHROID) tablet 75 mcg (has no administration in time range)  lisinopril (PRINIVIL,ZESTRIL) tablet 5 mg (has no administration in time range)  metoprolol succinate (TOPROL-XL) 24 hr tablet 25 mg (has no administration in time range)  ondansetron (ZOFRAN) tablet 4 mg (has no administration in time range)  pravastatin (PRAVACHOL) tablet 20 mg (has no administration in time range)     Procedures Critical Care  ED Course and Medical Decision Making  I have reviewed the triage vital signs and the nursing notes.  Pertinent labs & imaging results that were available during my care of the patient were reviewed by me and considered in my medical decision making (see below for details).  History of dementia, favoring behavioral disturbance related to dementia but also with a history of hyponatremia, UTI.  Medical work-up in process.  IVC papers filled out by family, if no medical reason for admission, will consult TTS and work towards psychiatric  admission/placement.  Medically cleared, home meds ordered, awaiting TTS evaluation.  IVC papers in place.  Signed out to default provider.  Barth Kirks. Sedonia Small, [place] [email or web address]  Final Clinical Impressions(s) / ED Diagnoses     ICD-10-CM   1. Alzheimer's dementia with behavioral disturbance, unspecified timing of dementia onset [facility]) G30.9    F02.81     ED Discharge Orders    None         Maudie Flakes,  MD 01/02/18 2141

## 2018-01-02 NOTE — ED Notes (Signed)
Patient changed into paper scrubs, given yellow fall socks.

## 2018-01-02 NOTE — ED Notes (Signed)
[place] [facility]) recommends inpatient geriatric placement.

## 2018-01-02 NOTE — ED Notes (Signed)
Paula Andrews, [phone] call for updates/placement

## 2018-01-02 NOTE — ED Notes (Signed)
Family at bedside. 

## 2018-01-03 ENCOUNTER — Encounter (HOSPITAL_COMMUNITY): Payer: Self-pay | Admitting: Emergency Medicine

## 2018-01-03 ENCOUNTER — Emergency Department (HOSPITAL_COMMUNITY): Payer: Medicare Other

## 2018-01-03 DIAGNOSIS — E039 Hypothyroidism, unspecified: Secondary | ICD-10-CM | POA: Diagnosis present

## 2018-01-03 DIAGNOSIS — Z79899 Other long term (current) drug therapy: Secondary | ICD-10-CM | POA: Diagnosis not present

## 2018-01-03 DIAGNOSIS — F0391 Unspecified dementia with behavioral disturbance: Secondary | ICD-10-CM | POA: Diagnosis present

## 2018-01-03 DIAGNOSIS — Z7984 Long term (current) use of oral hypoglycemic drugs: Secondary | ICD-10-CM | POA: Diagnosis not present

## 2018-01-03 DIAGNOSIS — F0281 Dementia in other diseases classified elsewhere with behavioral disturbance: Secondary | ICD-10-CM | POA: Diagnosis not present

## 2018-01-03 DIAGNOSIS — E785 Hyperlipidemia, unspecified: Secondary | ICD-10-CM | POA: Diagnosis present

## 2018-01-03 DIAGNOSIS — Z87891 Personal history of nicotine dependence: Secondary | ICD-10-CM | POA: Diagnosis not present

## 2018-01-03 DIAGNOSIS — G309 Alzheimer's disease, unspecified: Secondary | ICD-10-CM | POA: Diagnosis not present

## 2018-01-03 DIAGNOSIS — I10 Essential (primary) hypertension: Secondary | ICD-10-CM | POA: Diagnosis present

## 2018-01-03 DIAGNOSIS — E559 Vitamin D deficiency, unspecified: Secondary | ICD-10-CM | POA: Diagnosis present

## 2018-01-03 DIAGNOSIS — E119 Type 2 diabetes mellitus without complications: Secondary | ICD-10-CM | POA: Diagnosis not present

## 2018-01-03 DIAGNOSIS — R45851 Suicidal ideations: Secondary | ICD-10-CM | POA: Diagnosis not present

## 2018-01-03 DIAGNOSIS — R45850 Homicidal ideations: Secondary | ICD-10-CM | POA: Diagnosis not present

## 2018-01-03 DIAGNOSIS — Z23 Encounter for immunization: Secondary | ICD-10-CM | POA: Diagnosis not present

## 2018-01-03 NOTE — ED Notes (Signed)
Pt accepted to [facility] medical bed. Facility reported not receiving IVc paperwork that was faxed at [phone] with confirmation number. Paperwork re-faxed to [phone].

## 2018-01-03 NOTE — ED Notes (Signed)
Sheriff dept her for pickup. Pt calm and cooperative with officer.

## 2018-01-03 NOTE — ED Notes (Signed)
Received call from Audree Camel at [facility]. Informed pt has been accepted at [facility].

## 2018-01-03 NOTE — ED Provider Notes (Signed)
  Physical Exam  BP 135/64 (BP Location: Left Arm)   Pulse (!) 58   Temp 98 F (36.7 C) (Oral)   Resp 16   Ht 5\' 4"  (1.626 m)   Wt 69 kg   SpO2 99%   BMI 26.11 kg/m   Physical Exam  ED Course/Procedures     Procedures  MDM  Accepted at [facility] by Dr. Geanie Kenning.  Patient reexamined before transfer.       Davonna Belling, MD 01/03/18 [phone]

## 2018-01-03 NOTE — Progress Notes (Addendum)
[facility] has accepted patient pending a chest x-ray.  Patient is currently Voluntary.  CSW to check if there is a POA or guardian.  If not, pt may need to be IVC'd.  Areatha Keas. Judi Cong, MSW, [place] Disposition Clinical Social Work [phone] (cell) [phone] (office)   Addendum: CSW spoke with pt's husband, Valbona Slabach.  He reports that patient's POA is her son.  Son is working and [employer] paperwork is actually with husband, who is an invalid and can not drive. Husband confirms that pt can not sign a consent for treatment and agrees to IVC so that patient can be admitted to [facility].  CSW spoke with pt's Nurse, Colletta Maryland, RN, and asked if she would proceed with IVC for patient.  CSW called and spoke with [place], AC, Melissa of same.  Addendum #2 @14 :29 PM CSW received call from pt's daughter, Glori Bickers, [phone], who asks that we notify her, rather than her pt's husband, when she is going to be transferred. It is possible daughter may also bring Legal and Healthcare POA paperwork to the ED.  CSW asks that patient POA documents be uploaded to her EMR.

## 2018-01-03 NOTE — BHH Counselor (Signed)
Reassessment-Pt denies SI/HI and AVH. Pt appears to be continue to have some confusion.   TTS will continue to seek placement.

## 2018-01-03 NOTE — ED Notes (Signed)
IVC paperwork faxed to [facility] as requested.

## 2018-01-03 NOTE — Progress Notes (Signed)
Pt. meets criteria for inpatient treatment per Katherine Mantle, MD.  Referred out to the following hospitals: [facility]  [facility]     Disposition CSW will continue to follow for placement.  Areatha Keas. Judi Cong, MSW, [place] Disposition Clinical Social Work [phone] (cell) [phone] (office)

## 2018-01-03 NOTE — ED Notes (Signed)
Received call from behavioral health informing me patient's examination recommendation was filled out on the old form and needs to be redone on the new form that will be faxed in to [place].

## 2018-01-03 NOTE — Progress Notes (Signed)
Pt accepted to [facility], bed 411A Dr. Geanie Kenning is the accepting/attending provider.   Call report to [phone] AP ED RN has been notified.    Pt is involuntary and will need to be transported by law enforcement.    Pt may arrive at [facility] as soon as transportation is arranged.  [place] admissions is aware that if law enforcement is unable to transport pt tonight, she will arrive tomorrow morning (01/04/18).  CSW has faxed pt's POA, HCPOA and today's chest X-Ray results to [place], [place], [place] Disposition CSW [facility]/TTS [phone]

## 2018-01-03 NOTE — ED Notes (Signed)
Report received. Care assumed. Pt laying quietly on stretcher watching television. Sitter posted at door. Plan of care unchanged.

## 2018-01-03 NOTE — ED Notes (Signed)
Pt removed IV from left a/c

## 2018-01-03 NOTE — ED Notes (Signed)
Faxed papers to [facility] again on both machines, & both confirmed.

## 2018-01-11 DIAGNOSIS — G301 Alzheimer's disease with late onset: Secondary | ICD-10-CM | POA: Diagnosis not present

## 2018-01-11 DIAGNOSIS — I10 Essential (primary) hypertension: Secondary | ICD-10-CM | POA: Diagnosis not present

## 2018-01-11 DIAGNOSIS — F0281 Dementia in other diseases classified elsewhere with behavioral disturbance: Secondary | ICD-10-CM | POA: Diagnosis not present

## 2018-01-14 DIAGNOSIS — I10 Essential (primary) hypertension: Secondary | ICD-10-CM | POA: Diagnosis not present

## 2018-01-14 DIAGNOSIS — Z0000 Encounter for general adult medical examination without abnormal findings: Secondary | ICD-10-CM | POA: Diagnosis not present

## 2018-01-14 DIAGNOSIS — E039 Hypothyroidism, unspecified: Secondary | ICD-10-CM | POA: Diagnosis not present

## 2018-01-14 DIAGNOSIS — E1169 Type 2 diabetes mellitus with other specified complication: Secondary | ICD-10-CM | POA: Diagnosis not present

## 2018-01-14 DIAGNOSIS — E782 Mixed hyperlipidemia: Secondary | ICD-10-CM | POA: Diagnosis not present

## 2018-01-14 DIAGNOSIS — F0390 Unspecified dementia without behavioral disturbance: Secondary | ICD-10-CM | POA: Diagnosis not present

## 2018-01-14 DIAGNOSIS — L989 Disorder of the skin and subcutaneous tissue, unspecified: Secondary | ICD-10-CM | POA: Diagnosis not present

## 2018-01-14 DIAGNOSIS — E559 Vitamin D deficiency, unspecified: Secondary | ICD-10-CM | POA: Diagnosis not present

## 2018-01-14 DIAGNOSIS — Z6824 Body mass index (BMI) 24.0-24.9, adult: Secondary | ICD-10-CM | POA: Diagnosis not present

## 2018-01-20 ENCOUNTER — Encounter (HOSPITAL_COMMUNITY): Payer: Self-pay | Admitting: Emergency Medicine

## 2018-01-20 ENCOUNTER — Emergency Department (HOSPITAL_COMMUNITY)
Admission: EM | Admit: 2018-01-20 | Discharge: 2018-01-20 | Disposition: A | Discharge status: Home / Self Care | Payer: Medicare Other | Attending: Emergency Medicine | Admitting: Emergency Medicine

## 2018-01-20 ENCOUNTER — Emergency Department (HOSPITAL_COMMUNITY): Payer: Medicare Other

## 2018-01-20 ENCOUNTER — Other Ambulatory Visit: Payer: Self-pay

## 2018-01-20 DIAGNOSIS — J449 Chronic obstructive pulmonary disease, unspecified: Secondary | ICD-10-CM | POA: Insufficient documentation

## 2018-01-20 DIAGNOSIS — Z87891 Personal history of nicotine dependence: Secondary | ICD-10-CM | POA: Insufficient documentation

## 2018-01-20 DIAGNOSIS — R4182 Altered mental status, unspecified: Secondary | ICD-10-CM | POA: Diagnosis not present

## 2018-01-20 DIAGNOSIS — I10 Essential (primary) hypertension: Secondary | ICD-10-CM | POA: Diagnosis not present

## 2018-01-20 DIAGNOSIS — Z79899 Other long term (current) drug therapy: Secondary | ICD-10-CM | POA: Diagnosis not present

## 2018-01-20 DIAGNOSIS — E785 Hyperlipidemia, unspecified: Secondary | ICD-10-CM | POA: Diagnosis not present

## 2018-01-20 DIAGNOSIS — R001 Bradycardia, unspecified: Secondary | ICD-10-CM | POA: Diagnosis not present

## 2018-01-20 DIAGNOSIS — E119 Type 2 diabetes mellitus without complications: Secondary | ICD-10-CM | POA: Diagnosis not present

## 2018-01-20 DIAGNOSIS — R531 Weakness: Secondary | ICD-10-CM | POA: Diagnosis not present

## 2018-01-20 DIAGNOSIS — R0689 Other abnormalities of breathing: Secondary | ICD-10-CM | POA: Diagnosis not present

## 2018-01-20 DIAGNOSIS — Z7984 Long term (current) use of oral hypoglycemic drugs: Secondary | ICD-10-CM | POA: Insufficient documentation

## 2018-01-20 DIAGNOSIS — R0902 Hypoxemia: Secondary | ICD-10-CM | POA: Diagnosis not present

## 2018-01-20 LAB — COMPREHENSIVE METABOLIC PANEL
ALT: 14 U/L (ref 0–44)
AST: 13 U/L — ABNORMAL LOW (ref 15–41)
Albumin: 3.7 g/dL (ref 3.5–5.0)
Alkaline Phosphatase: 71 U/L (ref 38–126)
Anion gap: 5 (ref 5–15)
BUN: 16 mg/dL (ref 8–23)
CHLORIDE: 105 mmol/L (ref 98–111)
CO2: 31 mmol/L (ref 22–32)
CREATININE: 0.82 mg/dL (ref 0.44–1.00)
Calcium: 8.8 mg/dL — ABNORMAL LOW (ref 8.9–10.3)
GFR calc non Af Amer: >60 mL/min (ref >60)
Glucose, Bld: 91 mg/dL (ref 70–99)
Potassium: 4.1 mmol/L (ref 3.5–5.1)
Sodium: 141 mmol/L (ref 135–145)
Total Bilirubin: 0.5 mg/dL (ref 0.3–1.2)
Total Protein: 6.3 g/dL — ABNORMAL LOW (ref 6.5–8.1)

## 2018-01-20 LAB — URINALYSIS, ROUTINE W REFLEX MICROSCOPIC
BILIRUBIN URINE: NEGATIVE
GLUCOSE, UA: NEGATIVE mg/dL
HGB URINE DIPSTICK: NEGATIVE
KETONES UR: NEGATIVE mg/dL
Leukocytes, UA: NEGATIVE
Nitrite: NEGATIVE
Protein, ur: NEGATIVE mg/dL
SPECIFIC GRAVITY, URINE: 1.004 — AB (ref 1.005–1.030)
pH: 6 (ref 5.0–8.0)

## 2018-01-20 LAB — TROPONIN I: Troponin I: <0.03 ng/mL (ref <0.03)

## 2018-01-20 LAB — CBC
HCT: 43.7 % (ref 36.0–46.0)
HEMOGLOBIN: 13.2 g/dL (ref 12.0–15.0)
MCH: 29.5 pg (ref 26.0–34.0)
MCHC: 30.2 g/dL (ref 30.0–36.0)
MCV: 97.5 fL (ref 80.0–100.0)
NRBC: 0 % (ref 0.0–0.2)
Platelets: 249 10*3/uL (ref 150–400)
RBC: 4.48 MIL/uL (ref 3.87–5.11)
RDW: 13.1 % (ref 11.5–15.5)
WBC: 6.5 10*3/uL (ref 4.0–10.5)

## 2018-01-20 LAB — TSH: TSH: 0.72 u[IU]/mL (ref 0.350–4.500)

## 2018-01-20 NOTE — ED Notes (Signed)
Patient transported to CT 

## 2018-01-20 NOTE — Discharge Instructions (Signed)
I have asked for the home health service to contact you tomorrow.  Please talk to them on the phone about the problems with Paula Andrews using her home health devices including the CPAP machine.  Return to the emergency department for any severe or worsening symptoms including weakness, numbness, difficulty speaking, difficulty with balance or any other severe or worsening symptoms.

## 2018-01-20 NOTE — ED Provider Notes (Signed)
[place] [facility] Provider Note   CSN: [id number] Arrival date & time: 01/20/18  1242     History   Chief Complaint Chief Complaint  Patient presents with  . Altered Mental Status  . Weakness    HPI Paula Andrews is a 72 y.o. female.  HPI  The patient is a 72 year old female, she has a known history of diabetes, COPD, she also has a history of dementia which seems to be worsening over the last several months.  Review of the medical record shows that she was actually seen at the beginning of October approximately 3 weeks ago and had been transferred to a psychiatric unit because of increasing agitation, suicidal threats and homicidal threats against her husband.  She presents today by paramedic transport for a different reason, she states that she has had no complaints whatsoever but the family reports that over the last couple of days she has been increasingly fatigued, difficult to arise this morning, has not been her normal self.  The family is not here on the patient's arrival.  Level 5 caveat applies secondary to dementia.  Paramedics report no significant abnormal vital signs.  Past Medical History:  Diagnosis Date  . Dementia ([place])   . Diabetes mellitus   . Hypertension   . Thyroid disease     Patient Active Problem List   Diagnosis Date Noted  . COPD (chronic obstructive pulmonary disease) ([place]) 08/07/2017  . 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 Chronic respiratory failure with hypoxia and hypercapnia (HCC)   . Acute respiratory failure with hypoxia and hypercapnia ([place]) 05/30/2017  . Acute lower UTI 05/30/2017  . Essential hypertension 05/30/2017  . Diabetes mellitus type II, controlled ([place]) 05/30/2017  . Hyperlipemia 05/30/2017  . UTI (urinary tract infection) 05/30/2017  . Hypoxia 05/29/2017  . Hyponatremia 05/16/2017  . Weakness   . Fracture of radial neck, left, closed 01/29/2012  . CLOSED  FRACTURE UNSPEC PART UPPER END HUMERUS 04/13/2010    Past Surgical History:  Procedure Laterality Date  . ABDOMINAL HYSTERECTOMY       OB History   None      Home Medications    Prior to Admission medications   Medication Sig Start Date End Date Taking? Authorizing Provider  albuterol (PROVENTIL HFA;VENTOLIN HFA) 108 (90 Base) MCG/ACT inhaler Inhale 2 puffs into the lungs every 6 (six) hours as needed for wheezing or shortness of breath. 06/01/17  Yes Sheikh, Omair Latif, DO  amLODipine (NORVASC) 5 MG tablet Take 1 tablet (5 mg total) by mouth daily. 05/19/17 08/07/18 Yes Johnson, Clanford L, MD  donepezil (ARICEPT) 10 MG tablet Take 10 mg by mouth at bedtime.   Yes [provider]  levothyroxine (SYNTHROID, LEVOTHROID) 75 MCG tablet Take 75 mcg by mouth daily.    Yes [provider]  lisinopril (PRINIVIL,ZESTRIL) 5 MG tablet Take 5 mg by mouth daily.  05/26/17  Yes [provider]  Memantine HCl ER 21 MG CP24 Take 1 capsule by mouth daily.   Yes [provider]  metFORMIN (GLUCOPHAGE) 500 MG tablet Take 250 mg by mouth 2 (two) times daily with a meal.    Yes [provider]  metoprolol succinate (TOPROL-XL) 25 MG 24 hr tablet Take 25 mg by mouth every morning.   Yes [provider]  OLANZapine (ZYPREXA) 2.5 MG tablet Take 1 tablet by mouth at bedtime. 01/10/18  Yes [provider]  ondansetron (ZOFRAN) 4 MG tablet  Take 4 mg by mouth every 8 (eight) hours as needed for nausea or vomiting.  05/26/17  Yes [provider]  pravastatin (PRAVACHOL) 20 MG tablet Take 20 mg by mouth daily.    Yes [provider]  traZODone (DESYREL) 50 MG tablet Take 1 tablet by mouth at bedtime as needed for sleep. 01/10/18 02/09/18 Yes [provider]    Family History Family History  Problem Relation Age of Onset  . Diabetes Unknown     Social History Social History   Tobacco Use  . Smoking status: Former Smoker     Packs/day: 1.00    Years: 41.00    Pack years: 41.00    Types: Cigarettes    Last attempt to quit: 04/03/2001    Years since quitting: 16.8  . Smokeless tobacco: Never Used  Substance Use Topics  . Alcohol use: No  . Drug use: No     Allergies   Patient has no known allergies.   Review of Systems Review of Systems  Unable to perform ROS: Dementia     Physical Exam Updated Vital Signs BP (!) 164/58   Pulse (!) 50   Temp 98.7 F (37.1 C) (Oral)   Resp 19   Ht 1.626 m (5\' 4" )   Wt 69 kg   SpO2 99%   BMI 26.11 kg/m   Physical Exam  Constitutional: She appears well-developed and well-nourished. No distress.  HENT:  Head: Normocephalic and atraumatic.  Mouth/Throat: Oropharynx is clear and moist. No oropharyngeal exudate.  Eyes: Pupils are equal, round, and reactive to light. Conjunctivae and EOM are normal. Right eye exhibits no discharge. Left eye exhibits no discharge. No scleral icterus.  Neck: Normal range of motion. Neck supple. No JVD present. No thyromegaly present.  Cardiovascular: Regular rhythm, normal heart sounds and intact distal pulses. Exam reveals no gallop and no friction rub.  No murmur heard. Heart rate between 55 and 60, normal pulses, no murmurs  Pulmonary/Chest: Effort normal and breath sounds normal. No respiratory distress. She has no wheezes. She has no rales.  Abdominal: Soft. Bowel sounds are normal. She exhibits no distension and no mass. There is no tenderness.  Musculoskeletal: Normal range of motion. She exhibits no edema or tenderness.  Lymphadenopathy:    She has no cervical adenopathy.  Neurological: She is alert. Coordination normal.  The patient is alert, she is awake, she follows commands without any difficulty, she has some difficulty with memory however she is able to tell me the name of her husband, will to tell me her home address, she has normal strength in all 4 extremities with no facial droop, no slurred speech, no obvious  cranial nerve deficits 3 through 12.  Skin: Skin is warm and dry. No rash noted. No erythema.  Inspection of the skin of the thorax arms legs and face reveals no signs of rash  Psychiatric: She has a normal mood and affect. Her behavior is normal.  Nursing note and vitals reviewed.    ED Treatments / Results  Labs (all labs ordered are listed, but only abnormal results are displayed) Labs Reviewed  COMPREHENSIVE METABOLIC PANEL - Abnormal; Notable for the following components:      Result Value   Calcium 8.8 (*)    Total Protein 6.3 (*)    AST 13 (*)    All other components within normal limits  URINALYSIS, ROUTINE W REFLEX MICROSCOPIC - Abnormal; Notable for the following components:   Color, Urine STRAW (*)  Specific Gravity, Urine 1.004 (*)    All other components within normal limits  URINE CULTURE  CBC  TROPONIN I  TSH    EKG EKG Interpretation  Date/Time:  Sunday January 20 2018 13:01:32 EDT Ventricular Rate:  55 PR Interval:    QRS Duration: 97 QT Interval:  481 QTC Calculation: 461 R Axis:   66 Text Interpretation:  Sinus bradycardia Since last tracing 01/02/18, no changes seen Normal ECG Confirmed by Noemi Chapel [phone]) on 01/20/2018 1:04:02 PM   Radiology Ct Head Wo Contrast  Result Date: 01/20/2018 CLINICAL DATA:  72 year old female with lethargy EXAM: CT HEAD WITHOUT CONTRAST TECHNIQUE: Contiguous axial images were obtained from the base of the skull through the vertex without intravenous contrast. COMPARISON:  MR 05/17/2017, CT 05/16/2017 FINDINGS: Brain: No acute intracranial hemorrhage. No midline shift or mass effect. Gray-white differentiation is maintained. Unremarkable configuration the ventricles. Patchy hypodensity in the periventricular white matter is unchanged from prior. Focal hypodensity in the left periventricular white matter, occipital region. Vascular: Calcifications of the anterior circulation. Skull: No acute displaced fracture.  Sinuses/Orbits: No acute finding. Other: None IMPRESSION: Negative for acute intracranial abnormality. Evidence of chronic microvascular ischemic disease. Electronically Signed   By: Corrie Mckusick D.O.   On: 01/20/2018 16:00    Procedures Procedures (including critical care time)  Medications Ordered in ED Medications - No data to display   Initial Impression / Assessment and Plan / ED Course  I have reviewed the triage vital signs and the nursing notes.  Pertinent labs & imaging results that were available during my care of the patient were reviewed by me and considered in my medical decision making (see chart for details).    The patient is well-appearing, she has vital signs which are unremarkable, she is not hypoxic tachycardic hypotensive or febrile.  There was possibly report that she has had a fever over the last couple of days but at this time appears totally unremarkable.  Family report is not available at this time as they are not sure but reportedly in route. 1:03 PM  Family has arrived and states that this morning she was difficult to arouse, seem to be slurring her speech, somnolent, she did try to eat some breakfast but they are not sure if she had the entire biscuit, she was able to eat a good amount of food last night but reports that over the last several days she has had a progressive fatigue and weakness.  There is been no reports of any other symptoms according to the family.  She was seen by the neurologist 6 months ago, imaging of the brain at that time revealed that there may have been some prior stroke so the patient had been asymptomatic.  She was admitted to a psychiatric unit a couple of weeks ago, she had increasing amounts of Memantine prescribed but no other new medications.  The labs were thoroughly reviewed, they show no specific focal abnormalities that would be causing the patient's altered mental status.  This includes a normal TSH, a CT scan of the brain  which was normal, urinalysis without infection.  I discussed his care at length with the family, the patient has been here for many hours and has not had any signs of somnolence lethargy weakness or any abnormal mental status.  Further family members have shown up and states that this morning when they saw her she was just a little bit slow when she was walking but her speech was normal.  Reportedly they now add that she is not using her CPAP at night like she has been told to do, I suspect that may be this had something to do with it as well.  I had a discussion with the family about admitting versus going home, they agree that going home would be a reasonable option with close follow-up.  I will have home health discussed with the family some possibilities including some nursing assistance during some of the off-hours to help ensure more compliance with CPAP.  They are in agreement with this plan.   Final Clinical Impressions(s) / ED Diagnoses   Final diagnoses:  Altered mental status, unspecified altered mental status type    ED Discharge Orders         [place]     01/20/18 1815    Face-to-face encounter (required for Medicare/Medicaid patients)    Comments:  I Johnna Acosta certify that this patient is under my care and that I, or a nurse practitioner or physician's assistant working with me, had a face-to-face encounter that meets the physician face-to-face encounter requirements with this patient on 01/20/2018. The encounter with the patient was in whole, or in part for the following medical condition(s) which is the primary reason for home health care (List medical condition): demential - altered when doesn't use CPAP - question value of having an aide at night to help with compliance   01/20/18 1815           Noemi Chapel, MD 01/20/18 1816

## 2018-01-20 NOTE — ED Triage Notes (Signed)
Per EMS patient from home. Family stated that patient was has been lethargic over past few days. This morning patient was very hard to arouse. Family states altered mental status with history of Alzheimer's. Negative stroke scale with EMS.

## 2018-01-21 ENCOUNTER — Telehealth: Payer: Self-pay | Admitting: *Deleted

## 2018-01-21 LAB — URINE CULTURE: CULTURE: NO GROWTH

## 2018-01-21 NOTE — Telephone Encounter (Signed)
Paula Andrews J. Clydene Laming, RN, BSN, [employer] [phone] Spoke with pt at bedside regarding discharge planning for [facility]. Offered pt list of home health agencies to choose from.  Pt chose [facility] to render services. [facility]  of [facility] notified. Patient made aware that [facility] will be in contact in 24 hours.  No DME needs identified at this time.

## 2018-01-22 DIAGNOSIS — Z1211 Encounter for screening for malignant neoplasm of colon: Secondary | ICD-10-CM | POA: Diagnosis not present

## 2018-01-22 DIAGNOSIS — Z1212 Encounter for screening for malignant neoplasm of rectum: Secondary | ICD-10-CM | POA: Diagnosis not present

## 2018-01-24 DIAGNOSIS — C44311 Basal cell carcinoma of skin of nose: Secondary | ICD-10-CM | POA: Diagnosis not present

## 2018-01-29 DIAGNOSIS — R4182 Altered mental status, unspecified: Secondary | ICD-10-CM | POA: Diagnosis not present

## 2018-01-29 DIAGNOSIS — Z6824 Body mass index (BMI) 24.0-24.9, adult: Secondary | ICD-10-CM | POA: Diagnosis not present

## 2018-01-29 DIAGNOSIS — G4733 Obstructive sleep apnea (adult) (pediatric): Secondary | ICD-10-CM | POA: Diagnosis not present

## 2018-01-30 ENCOUNTER — Other Ambulatory Visit (HOSPITAL_COMMUNITY): Payer: Self-pay | Admitting: Internal Medicine

## 2018-01-30 DIAGNOSIS — Z780 Asymptomatic menopausal state: Secondary | ICD-10-CM

## 2018-01-30 DIAGNOSIS — Z1231 Encounter for screening mammogram for malignant neoplasm of breast: Secondary | ICD-10-CM

## 2018-02-03 DIAGNOSIS — T63481A Toxic effect of venom of other arthropod, accidental (unintentional), initial encounter: Secondary | ICD-10-CM | POA: Diagnosis not present

## 2018-02-03 DIAGNOSIS — Z8673 Personal history of transient ischemic attack (TIA), and cerebral infarction without residual deficits: Secondary | ICD-10-CM | POA: Diagnosis not present

## 2018-02-03 DIAGNOSIS — Z79899 Other long term (current) drug therapy: Secondary | ICD-10-CM | POA: Diagnosis not present

## 2018-02-03 DIAGNOSIS — F0390 Unspecified dementia without behavioral disturbance: Secondary | ICD-10-CM | POA: Diagnosis not present

## 2018-02-03 DIAGNOSIS — I10 Essential (primary) hypertension: Secondary | ICD-10-CM | POA: Diagnosis not present

## 2018-02-03 DIAGNOSIS — R51 Headache: Secondary | ICD-10-CM | POA: Diagnosis not present

## 2018-02-03 DIAGNOSIS — E119 Type 2 diabetes mellitus without complications: Secondary | ICD-10-CM | POA: Diagnosis not present

## 2018-02-03 DIAGNOSIS — E0590 Thyrotoxicosis, unspecified without thyrotoxic crisis or storm: Secondary | ICD-10-CM | POA: Diagnosis not present

## 2018-02-03 DIAGNOSIS — Z87891 Personal history of nicotine dependence: Secondary | ICD-10-CM | POA: Diagnosis not present

## 2018-02-03 DIAGNOSIS — T63441A Toxic effect of venom of bees, accidental (unintentional), initial encounter: Secondary | ICD-10-CM | POA: Diagnosis not present

## 2018-02-07 DIAGNOSIS — L03211 Cellulitis of face: Secondary | ICD-10-CM | POA: Diagnosis not present

## 2018-02-07 DIAGNOSIS — E119 Type 2 diabetes mellitus without complications: Secondary | ICD-10-CM | POA: Diagnosis not present

## 2018-02-13 ENCOUNTER — Ambulatory Visit (HOSPITAL_COMMUNITY)
Admission: RE | Admit: 2018-02-13 | Discharge: 2018-02-13 | Disposition: A | Discharge status: Home / Self Care | Payer: Medicare Other | Source: Ambulatory Visit | Attending: Internal Medicine | Admitting: Internal Medicine

## 2018-02-13 ENCOUNTER — Encounter (HOSPITAL_COMMUNITY): Payer: Self-pay

## 2018-02-13 DIAGNOSIS — Z780 Asymptomatic menopausal state: Secondary | ICD-10-CM

## 2018-02-13 DIAGNOSIS — Z1231 Encounter for screening mammogram for malignant neoplasm of breast: Secondary | ICD-10-CM

## 2018-02-13 DIAGNOSIS — M810 Age-related osteoporosis without current pathological fracture: Secondary | ICD-10-CM | POA: Diagnosis not present

## 2018-02-25 DIAGNOSIS — Z85828 Personal history of other malignant neoplasm of skin: Secondary | ICD-10-CM | POA: Diagnosis not present

## 2018-02-25 DIAGNOSIS — Z08 Encounter for follow-up examination after completed treatment for malignant neoplasm: Secondary | ICD-10-CM | POA: Diagnosis not present

## 2018-03-13 ENCOUNTER — Emergency Department (HOSPITAL_COMMUNITY): Payer: Medicare Other

## 2018-03-13 ENCOUNTER — Inpatient Hospital Stay (HOSPITAL_COMMUNITY)
Admission: EM | Admit: 2018-03-13 | Discharge: 2018-03-17 | DRG: 175 | Disposition: A | Discharge status: Home Health | Payer: Medicare Other | Attending: Internal Medicine | Admitting: Internal Medicine

## 2018-03-13 ENCOUNTER — Encounter (HOSPITAL_COMMUNITY): Payer: Self-pay

## 2018-03-13 ENCOUNTER — Other Ambulatory Visit: Payer: Self-pay

## 2018-03-13 ENCOUNTER — Observation Stay (HOSPITAL_COMMUNITY): Payer: Medicare Other

## 2018-03-13 DIAGNOSIS — R001 Bradycardia, unspecified: Secondary | ICD-10-CM | POA: Diagnosis not present

## 2018-03-13 DIAGNOSIS — J9621 Acute and chronic respiratory failure with hypoxia: Secondary | ICD-10-CM | POA: Diagnosis not present

## 2018-03-13 DIAGNOSIS — J9601 Acute respiratory failure with hypoxia: Secondary | ICD-10-CM | POA: Diagnosis not present

## 2018-03-13 DIAGNOSIS — J441 Chronic obstructive pulmonary disease with (acute) exacerbation: Secondary | ICD-10-CM

## 2018-03-13 DIAGNOSIS — I2699 Other pulmonary embolism without acute cor pulmonale: Secondary | ICD-10-CM | POA: Diagnosis not present

## 2018-03-13 DIAGNOSIS — G9340 Encephalopathy, unspecified: Secondary | ICD-10-CM

## 2018-03-13 DIAGNOSIS — Z66 Do not resuscitate: Secondary | ICD-10-CM | POA: Diagnosis present

## 2018-03-13 DIAGNOSIS — Z7951 Long term (current) use of inhaled steroids: Secondary | ICD-10-CM

## 2018-03-13 DIAGNOSIS — G4733 Obstructive sleep apnea (adult) (pediatric): Secondary | ICD-10-CM | POA: Diagnosis present

## 2018-03-13 DIAGNOSIS — E039 Hypothyroidism, unspecified: Secondary | ICD-10-CM

## 2018-03-13 DIAGNOSIS — F0391 Unspecified dementia with behavioral disturbance: Secondary | ICD-10-CM | POA: Diagnosis not present

## 2018-03-13 DIAGNOSIS — Z79899 Other long term (current) drug therapy: Secondary | ICD-10-CM

## 2018-03-13 DIAGNOSIS — F39 Unspecified mood [affective] disorder: Secondary | ICD-10-CM

## 2018-03-13 DIAGNOSIS — E538 Deficiency of other specified B group vitamins: Secondary | ICD-10-CM | POA: Diagnosis present

## 2018-03-13 DIAGNOSIS — G9341 Metabolic encephalopathy: Secondary | ICD-10-CM | POA: Diagnosis present

## 2018-03-13 DIAGNOSIS — R069 Unspecified abnormalities of breathing: Secondary | ICD-10-CM | POA: Diagnosis not present

## 2018-03-13 DIAGNOSIS — W19XXXA Unspecified fall, initial encounter: Secondary | ICD-10-CM | POA: Diagnosis not present

## 2018-03-13 DIAGNOSIS — F0390 Unspecified dementia without behavioral disturbance: Secondary | ICD-10-CM

## 2018-03-13 DIAGNOSIS — J9622 Acute and chronic respiratory failure with hypercapnia: Secondary | ICD-10-CM | POA: Diagnosis present

## 2018-03-13 DIAGNOSIS — Z9981 Dependence on supplemental oxygen: Secondary | ICD-10-CM

## 2018-03-13 DIAGNOSIS — I6782 Cerebral ischemia: Secondary | ICD-10-CM | POA: Diagnosis not present

## 2018-03-13 DIAGNOSIS — R55 Syncope and collapse: Secondary | ICD-10-CM | POA: Diagnosis not present

## 2018-03-13 DIAGNOSIS — I10 Essential (primary) hypertension: Secondary | ICD-10-CM | POA: Diagnosis not present

## 2018-03-13 DIAGNOSIS — Z87891 Personal history of nicotine dependence: Secondary | ICD-10-CM

## 2018-03-13 DIAGNOSIS — Z7984 Long term (current) use of oral hypoglycemic drugs: Secondary | ICD-10-CM

## 2018-03-13 DIAGNOSIS — E119 Type 2 diabetes mellitus without complications: Secondary | ICD-10-CM

## 2018-03-13 DIAGNOSIS — Z9119 Patient's noncompliance with other medical treatment and regimen: Secondary | ICD-10-CM

## 2018-03-13 DIAGNOSIS — E785 Hyperlipidemia, unspecified: Secondary | ICD-10-CM

## 2018-03-13 LAB — URINALYSIS, ROUTINE W REFLEX MICROSCOPIC
Bilirubin Urine: NEGATIVE
GLUCOSE, UA: NEGATIVE mg/dL
Hgb urine dipstick: NEGATIVE
Ketones, ur: NEGATIVE mg/dL
Leukocytes, UA: NEGATIVE
Nitrite: NEGATIVE
Protein, ur: NEGATIVE mg/dL
SPECIFIC GRAVITY, URINE: 1.025 (ref 1.005–1.030)
pH: 5.5 (ref 5.0–8.0)

## 2018-03-13 LAB — TROPONIN I: Troponin I: <0.03 ng/mL (ref <0.03)

## 2018-03-13 LAB — CBG MONITORING, ED: Glucose-Capillary: 140 mg/dL — ABNORMAL HIGH (ref 70–99)

## 2018-03-13 LAB — BLOOD GAS, VENOUS
ACID-BASE EXCESS: 6.5 mmol/L — AB (ref 0.0–2.0)
Bicarbonate: 27.6 mmol/L (ref 20.0–28.0)
DRAWN BY: 181601
O2 Content: 2 L/min
O2 Saturation: 87.4 %
Patient temperature: 37
pCO2, Ven: 73.7 mmHg (ref 44.0–60.0)
pH, Ven: 7.274 (ref 7.250–7.430)
pO2, Ven: 59.4 mmHg — ABNORMAL HIGH (ref 32.0–45.0)

## 2018-03-13 LAB — COMPREHENSIVE METABOLIC PANEL
ALK PHOS: 68 U/L (ref 38–126)
ALT: 11 U/L (ref 0–44)
AST: 14 U/L — ABNORMAL LOW (ref 15–41)
Albumin: 4.2 g/dL (ref 3.5–5.0)
Anion gap: 10 (ref 5–15)
BUN: 15 mg/dL (ref 8–23)
CO2: 30 mmol/L (ref 22–32)
Calcium: 9 mg/dL (ref 8.9–10.3)
Chloride: 101 mmol/L (ref 98–111)
Creatinine, Ser: 0.77 mg/dL (ref 0.44–1.00)
GFR calc Af Amer: >60 mL/min (ref >60)
Glucose, Bld: 141 mg/dL — ABNORMAL HIGH (ref 70–99)
Potassium: 4 mmol/L (ref 3.5–5.1)
Sodium: 141 mmol/L (ref 135–145)
Total Bilirubin: 0.8 mg/dL (ref 0.3–1.2)
Total Protein: 6.9 g/dL (ref 6.5–8.1)

## 2018-03-13 LAB — CBC WITH DIFFERENTIAL/PLATELET
Abs Immature Granulocytes: 0.03 10*3/uL (ref 0.00–0.07)
BASOS PCT: 1 %
Basophils Absolute: 0.1 10*3/uL (ref 0.0–0.1)
EOS PCT: 1 %
Eosinophils Absolute: 0 10*3/uL (ref 0.0–0.5)
HCT: 45.3 % (ref 36.0–46.0)
Hemoglobin: 13.5 g/dL (ref 12.0–15.0)
Immature Granulocytes: 0 %
Lymphocytes Relative: 20 %
Lymphs Abs: 1.6 10*3/uL (ref 0.7–4.0)
MCH: 30.1 pg (ref 26.0–34.0)
MCHC: 29.8 g/dL — ABNORMAL LOW (ref 30.0–36.0)
MCV: 101.1 fL — ABNORMAL HIGH (ref 80.0–100.0)
Monocytes Absolute: 0.6 10*3/uL (ref 0.1–1.0)
Monocytes Relative: 8 %
Neutro Abs: 5.8 10*3/uL (ref 1.7–7.7)
Neutrophils Relative %: 70 %
Platelets: 206 10*3/uL (ref 150–400)
RBC: 4.48 MIL/uL (ref 3.87–5.11)
RDW: 13.2 % (ref 11.5–15.5)
WBC: 8.1 10*3/uL (ref 4.0–10.5)
nRBC: 0 % (ref 0.0–0.2)

## 2018-03-13 LAB — GLUCOSE, CAPILLARY: Glucose-Capillary: 233 mg/dL — ABNORMAL HIGH (ref 70–99)

## 2018-03-13 LAB — D-DIMER, QUANTITATIVE: D-Dimer, Quant: 2.81 ug/mL-FEU — ABNORMAL HIGH (ref 0.00–0.50)

## 2018-03-13 MED ORDER — PREDNISONE 20 MG PO TABS
40.0000 mg | ORAL_TABLET | Freq: Every day | ORAL | Status: DC
  Filled 2018-03-13: qty 2

## 2018-03-13 MED ORDER — PRAVASTATIN SODIUM 10 MG PO TABS
20.0000 mg | ORAL_TABLET | Freq: Every day | ORAL | Status: DC
  Administered 2018-03-15 – 2018-03-17 (×3): 20 mg via ORAL
  Filled 2018-03-13 (×3): qty 2
  Filled 2018-03-13: qty 1

## 2018-03-13 MED ORDER — INSULIN ASPART 100 UNIT/ML ~~LOC~~ SOLN
0.0000 [IU] | Freq: Three times a day (TID) | SUBCUTANEOUS | Status: DC
  Administered 2018-03-14 – 2018-03-15 (×4): 2 [IU] via SUBCUTANEOUS
  Administered 2018-03-15: 5 [IU] via SUBCUTANEOUS
  Administered 2018-03-15: 7 [IU] via SUBCUTANEOUS
  Administered 2018-03-16: 3 [IU] via SUBCUTANEOUS
  Administered 2018-03-16 (×2): 2 [IU] via SUBCUTANEOUS
  Administered 2018-03-17: 7 [IU] via SUBCUTANEOUS
  Administered 2018-03-17 (×2): 2 [IU] via SUBCUTANEOUS

## 2018-03-13 MED ORDER — MEMANTINE HCL 10 MG PO TABS
10.0000 mg | ORAL_TABLET | Freq: Two times a day (BID) | ORAL | Status: DC
  Administered 2018-03-13 – 2018-03-17 (×8): 10 mg via ORAL
  Filled 2018-03-13 (×12): qty 1

## 2018-03-13 MED ORDER — IOPAMIDOL (ISOVUE-370) INJECTION 76%
100.0000 mL | Freq: Once | INTRAVENOUS | Status: AC | PRN
  Administered 2018-03-13: 100 mL via INTRAVENOUS

## 2018-03-13 MED ORDER — SODIUM CHLORIDE 0.9 % IV SOLN
INTRAVENOUS | Status: AC
  Administered 2018-03-13 – 2018-03-14 (×2): via INTRAVENOUS

## 2018-03-13 MED ORDER — DONEPEZIL HCL 5 MG PO TABS
ORAL_TABLET | ORAL | Status: AC
  Filled 2018-03-13: qty 2

## 2018-03-13 MED ORDER — LEVOTHYROXINE SODIUM 75 MCG PO TABS
75.0000 ug | ORAL_TABLET | Freq: Every day | ORAL | Status: DC
  Administered 2018-03-15 – 2018-03-17 (×3): 75 ug via ORAL
  Filled 2018-03-13 (×6): qty 1

## 2018-03-13 MED ORDER — DONEPEZIL HCL 5 MG PO TABS
10.0000 mg | ORAL_TABLET | Freq: Every day | ORAL | Status: DC
  Administered 2018-03-13 – 2018-03-16 (×4): 10 mg via ORAL
  Filled 2018-03-13 (×6): qty 2

## 2018-03-13 MED ORDER — ENOXAPARIN SODIUM 40 MG/0.4ML ~~LOC~~ SOLN
40.0000 mg | SUBCUTANEOUS | Status: DC
  Administered 2018-03-13: 40 mg via SUBCUTANEOUS
  Filled 2018-03-13: qty 0.4

## 2018-03-13 MED ORDER — MEMANTINE HCL 10 MG PO TABS
ORAL_TABLET | ORAL | Status: AC
  Filled 2018-03-13: qty 1

## 2018-03-13 MED ORDER — OLANZAPINE 5 MG PO TABS
2.5000 mg | ORAL_TABLET | Freq: Every day | ORAL | Status: DC
  Administered 2018-03-13 – 2018-03-16 (×4): 2.5 mg via ORAL
  Filled 2018-03-13 (×4): qty 1

## 2018-03-13 MED ORDER — METHYLPREDNISOLONE SODIUM SUCC 125 MG IJ SOLR
125.0000 mg | Freq: Once | INTRAMUSCULAR | Status: AC
  Administered 2018-03-13: 125 mg via INTRAVENOUS
  Filled 2018-03-13: qty 2

## 2018-03-13 MED ORDER — ACETAMINOPHEN 325 MG PO TABS: 650.0000 mg | ORAL_TABLET | Freq: Two times a day (BID) | ORAL | Status: DC | PRN

## 2018-03-13 MED ORDER — SODIUM CHLORIDE 0.9 % IV SOLN
Freq: Once | INTRAVENOUS | Status: AC
  Administered 2018-03-13: 18:00:00 via INTRAVENOUS

## 2018-03-13 MED ORDER — IPRATROPIUM-ALBUTEROL 0.5-2.5 (3) MG/3ML IN SOLN
3.0000 mL | Freq: Once | RESPIRATORY_TRACT | Status: AC
  Administered 2018-03-13: 3 mL via RESPIRATORY_TRACT
  Filled 2018-03-13: qty 3

## 2018-03-13 MED ORDER — IPRATROPIUM-ALBUTEROL 0.5-2.5 (3) MG/3ML IN SOLN: 3.0000 mL | Freq: Four times a day (QID) | RESPIRATORY_TRACT | Status: DC | PRN

## 2018-03-13 NOTE — ED Notes (Addendum)
Date and time results received: 03/13/18 3:36 PM   Test: pH Critical Value: 7.27  Name of Provider Notified: Melina Copa, MD  Orders Received? Or Actions Taken?: None at this time, will continue to monitor

## 2018-03-13 NOTE — ED Triage Notes (Signed)
Pt sent by pcp's office.  Reports pt had syncopal episode this morning and had been incontinent of bowels and bladder this morning when she passed out.   Reports o2 sat  was 66% with husband's o2 sat meter.  Spouse says pt has dementia and has had increased weakness and confusion over the past 2 weeks.  EMS came to house and evaluated pt but was not transported.  Pt saw PCP this afternoon and was sent here.  PCP reports pt's bp was 88/40, hr 42 and o2 sat 93% on room air at rest  But 85% with exertion.  Pt alert.

## 2018-03-13 NOTE — ED Notes (Signed)
Called lab to confirm add on troponin.

## 2018-03-13 NOTE — ED Notes (Addendum)
Date and time results received: 03/13/18 3:37 PM   Test: CO2 Critical Value: 73.7  Name of Provider Notified: Melina Copa, MD  Orders Received? Or Actions Taken?: none at this time, will continue to monitor

## 2018-03-13 NOTE — ED Provider Notes (Signed)
[facility] EMERGENCY DEPARTMENT Provider Note   CSN: [id number] Arrival date & time: 03/13/18  1426     History   Chief Complaint Chief Complaint  Patient presents with  . Loss of Consciousness    HPI Paula Andrews is a 72 y.o. female.  Level 5 caveat secondary to dementia.  She is brought in from her PCPs office for evaluation of syncope.  Per her husband she has a history of dementia but for the past 2 weeks she has been sleeping all the time.  She has not been using her CPAP.  She is on oxygen as needed and he is put her on 3 L nasal cannula continuous.  He said today she was standing at the stove when she slumped to the ground and was incontinent of bowel and bladder.  He checked her pulse ox there with his portable pulse ox machine and found it to be 66%.  EMS was called and they did not transport her.  The family took her to the PCPs office and she was noted to have a low blood pressure at their low heart rate and saturations were 93% on room air and 85% with exertion.  Patient herself denies complaints.  She is not sure why she is here.  The history is provided by the patient, the spouse and a relative.  Loss of Consciousness   This is a new problem. The current episode started 3 to 5 hours ago. She lost consciousness for a period of less than one minute. The problem is associated with normal activity. Associated symptoms include bladder incontinence and bowel incontinence. Pertinent negatives include abdominal pain, chest pain, fever and focal weakness. She has tried bed rest for the symptoms. The treatment provided moderate relief. Her past medical history is significant for CVA and DM.    Past Medical History:  Diagnosis Date  . Dementia ([place])   . Diabetes mellitus   . Hypertension   . Thyroid disease     Patient Active Problem List   Diagnosis Date Noted  . COPD (chronic obstructive pulmonary disease) ([place]) 08/07/2017  . Adjustment disorder with mixed disturbance of  emotions and conduct 05/31/2017  . Chronic respiratory failure with hypoxia and hypercapnia ([facility])   . Acute respiratory failure with hypoxia and hypercapnia ([place]) 05/30/2017  . Acute lower UTI 05/30/2017  . Essential hypertension 05/30/2017  . Diabetes mellitus type II, controlled ([place]) 05/30/2017  . Hyperlipemia 05/30/2017  . UTI (urinary tract infection) 05/30/2017  . Hypoxia 05/29/2017  . Hyponatremia 05/16/2017  . Weakness   . Fracture of radial neck, left, closed 01/29/2012  . CLOSED FRACTURE UNSPEC PART UPPER END HUMERUS 04/13/2010    Past Surgical History:  Procedure Laterality Date  . ABDOMINAL HYSTERECTOMY       OB History   None      Home Medications    Prior to Admission medications   Medication Sig Start Date End Date Taking? Authorizing Provider  albuterol (PROVENTIL HFA;VENTOLIN HFA) 108 (90 Base) MCG/ACT inhaler Inhale 2 puffs into the lungs every 6 (six) hours as needed for wheezing or shortness of breath. 06/01/17   Sheikh, Omair Latif, DO  amLODipine (NORVASC) 5 MG tablet Take 1 tablet (5 mg total) by mouth daily. 05/19/17 08/07/18  Johnson, Clanford L, MD  donepezil (ARICEPT) 10 MG tablet Take 10 mg by mouth at bedtime.    [provider]  levothyroxine (SYNTHROID, LEVOTHROID) 75 MCG tablet Take 75 mcg by mouth daily.  [provider]  lisinopril (PRINIVIL,ZESTRIL) 5 MG tablet Take 5 mg by mouth daily.  05/26/17   [provider]  Memantine HCl ER 21 MG CP24 Take 1 capsule by mouth daily.    [provider]  metFORMIN (GLUCOPHAGE) 500 MG tablet Take 250 mg by mouth 2 (two) times daily with a meal.     [provider]  metoprolol succinate (TOPROL-XL) 25 MG 24 hr tablet Take 25 mg by mouth every morning.    [provider]  OLANZapine (ZYPREXA) 2.5 MG tablet Take 1 tablet by mouth at bedtime. 01/10/18   [provider]  ondansetron (ZOFRAN) 4 MG tablet Take 4 mg by mouth every 8 (eight) hours as  needed for nausea or vomiting.  05/26/17   [provider]  pravastatin (PRAVACHOL) 20 MG tablet Take 20 mg by mouth daily.     [provider]  traZODone (DESYREL) 50 MG tablet Take 1 tablet by mouth at bedtime as needed for sleep. 01/10/18 02/09/18  [provider]    Family History Family History  Problem Relation Age of Onset  . Diabetes Unknown     Social History Social History   Tobacco Use  . Smoking status: Former Smoker    Packs/day: 1.00    Years: 41.00    Pack years: 41.00    Types: Cigarettes    Last attempt to quit: 04/03/2001    Years since quitting: 16.9  . Smokeless tobacco: Never Used  Substance Use Topics  . Alcohol use: No  . Drug use: No     Allergies   Patient has no known allergies.   Review of Systems Review of Systems  Unable to perform ROS: Dementia  Constitutional: Positive for activity change and fatigue. Negative for chills and fever.  HENT: Negative for sore throat.   Eyes: Negative for redness.  Respiratory: Negative for shortness of breath.   Cardiovascular: Positive for syncope. Negative for chest pain.  Gastrointestinal: Positive for bowel incontinence. Negative for abdominal pain.  Genitourinary: Positive for bladder incontinence. Negative for dysuria.  Skin: Negative for rash.  Neurological: Positive for syncope. Negative for focal weakness.     Physical Exam Updated Vital Signs BP (!) 106/41 (BP Location: Left Arm)   Pulse (!) 54   Temp 97.8 F (36.6 C) (Oral)   Resp 18   Ht 5\' 4"  (1.626 m)   Wt 63.5 kg   SpO2 (!) 84%   BMI 24.03 kg/m   Physical Exam  Constitutional: She appears well-developed and well-nourished. No distress.  HENT:  Head: Normocephalic and atraumatic.  Eyes: Conjunctivae are normal.  Neck: Neck supple.  Cardiovascular: Regular rhythm. Bradycardia present.  No murmur heard. Pulmonary/Chest: Effort normal and breath sounds normal. No respiratory distress.  Abdominal: Soft.  There is no tenderness.  Musculoskeletal: She exhibits no edema, tenderness or deformity.  Neurological: She is alert.  She is somewhat slow to answer.  She otherwise is alert.  She has a nonfocal motor exam.  Skin: Skin is warm and dry.  Psychiatric: She has a normal mood and affect.  Nursing note and vitals reviewed.    ED Treatments / Results  Labs (all labs ordered are listed, but only abnormal results are displayed) Labs Reviewed  CBC WITH DIFFERENTIAL/PLATELET - Abnormal; Notable for the following components:      Result Value   MCV 101.1 (*)    MCHC 29.8 (*)    All other components within normal limits  COMPREHENSIVE METABOLIC  PANEL - Abnormal; Notable for the following components:   Glucose, Bld 141 (*)    AST 14 (*)    All other components within normal limits  BLOOD GAS, VENOUS - Abnormal; Notable for the following components:   pCO2, Ven 73.7 (*)    pO2, Ven 59.4 (*)    Acid-Base Excess 6.5 (*)    All other components within normal limits  D-DIMER, QUANTITATIVE (NOT AT [facility]) - Abnormal; Notable for the following components:   D-Dimer, Quant 2.81 (*)    All other components within normal limits  GLUCOSE, CAPILLARY - Abnormal; Notable for the following components:   Glucose-Capillary 233 (*)    All other components within normal limits  CBG MONITORING, ED - Abnormal; Notable for the following components:   Glucose-Capillary 140 (*)    All other components within normal limits  URINALYSIS, ROUTINE W REFLEX MICROSCOPIC  TROPONIN I  VITAMIN B12  TSH  HEMOGLOBIN A1C    EKG EKG Interpretation  Date/Time:  Wednesday March 13 2018 14:45:36 EST Ventricular Rate:  52 PR Interval:    QRS Duration: 88 QT Interval:  451 QTC Calculation: 420 R Axis:   73 Text Interpretation:  Sinus rhythm no acute st/ts similar to prior 10/19 Confirmed by Aletta Edouard [phone]) on 03/13/2018 2:51:05 PM   Radiology Ct Head Wo Contrast  Result Date: 03/13/2018 CLINICAL DATA:   72 year old female with history of syncopal event this morning. Incontinent of bowel and bladder. EXAM: CT HEAD WITHOUT CONTRAST TECHNIQUE: Contiguous axial images were obtained from the base of the skull through the vertex without intravenous contrast. COMPARISON:  Head CT 01/20/2018. FINDINGS: Brain: Mild cerebral atrophy. Patchy and confluent areas of decreased attenuation are noted throughout the deep and periventricular white matter of the cerebral hemispheres bilaterally, compatible with chronic microvascular ischemic disease. More focal area of hypoattenuation in the white matter of the left occipital lobe, similar to the prior examination, compatible with encephalomalacia/gliosis from remote infarction. No evidence of acute infarction, hemorrhage, hydrocephalus, extra-axial collection or mass lesion/mass effect. Vascular: No hyperdense vessel or unexpected calcification. Skull: Normal. Negative for fracture or focal lesion. Sinuses/Orbits: No acute finding. Other: None. IMPRESSION: 1. No acute intracranial abnormalities. 2. 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 Electronically Signed   By: Vinnie Langton M.D.   On: 03/13/2018 15:54   Dg Chest Port 1 View  Result Date: 03/13/2018 CLINICAL DATA:  Syncopal episode this morning, incontinence and hypoxia. EXAM: PORTABLE CHEST 1 VIEW COMPARISON:  Chest radiograph January 03, 2018 FINDINGS: Cardiac silhouette is upper limits of normal size. Calcified aortic arch. Mild chronic bronchitic changes. No pleural effusion or focal consolidation. No pneumothorax. Soft tissue planes and included osseous structures are nonacute. IMPRESSION: Borderline cardiomegaly. Mild chronic bronchitic changes without focal consolidation. Electronically Signed   By: Elon Alas M.D.   On: 03/13/2018 15:10    Procedures Procedures (including critical care time)  Medications  Ordered in ED Medications  ipratropium-albuterol (DUONEB) 0.5-2.5 (3) MG/3ML nebulizer solution 3 mL (has no administration in time range)  predniSONE (DELTASONE) tablet 40 mg (has no administration in time range)  0.9 %  sodium chloride infusion ( Intravenous Transfusing/Transfer 03/13/18 1940)  acetaminophen (TYLENOL) tablet 650 mg (has no administration in time range)  pravastatin (PRAVACHOL) tablet 20 mg (has no administration in time range)  donepezil (ARICEPT) tablet 10 mg (has no administration in time range)  memantine (NAMENDA) tablet 10 mg (  has no administration in time range)  OLANZapine (ZYPREXA) tablet 2.5 mg (has no administration in time range)  levothyroxine (SYNTHROID, LEVOTHROID) tablet 75 mcg (has no administration in time range)  enoxaparin (LOVENOX) injection 40 mg (has no administration in time range)  insulin aspart (novoLOG) injection 0-9 Units (has no administration in time range)  ipratropium-albuterol (DUONEB) 0.5-2.5 (3) MG/3ML nebulizer solution 3 mL (3 mLs Nebulization Given 03/13/18 1605)  0.9 %  sodium chloride infusion ( Intravenous Stopped 03/13/18 1847)  methylPREDNISolone sodium succinate (SOLU-MEDROL) 125 mg/2 mL injection 125 mg (125 mg Intravenous Given 03/13/18 1931)     Initial Impression / Assessment and Plan / ED Course  I have reviewed the triage vital signs and the nursing notes.  Pertinent labs & imaging results that were available during my care of the patient were reviewed by me and considered in my medical decision making (see chart for details).  Clinical Course as of Mar 13 2201  Wed Mar 13, 2018  1541 Patient's lab work so far is been fairly unremarkable.  The only thing that might explain her symptoms are her elevated PCO2 but with her pH being normal that likely is somewhat chronic.  CT still pending.  I have ordered a DuoNeb.   [MB]  8889 I reviewed the labs with the patient and her husband and another family member.  I cannot explain  her syncopal event with that bowel bladder incontinence.  She seems rather depressed here now but certainly not sick appearing.  I am calling the hospitalist for discussion of admission.   [MB]    Clinical Course User Index [MB] Hayden Rasmussen, MD     Final Clinical Impressions(s) / ED Diagnoses   Final diagnoses:  Syncope and collapse    ED Discharge Orders    None       Hayden Rasmussen, MD 03/13/18 2202

## 2018-03-13 NOTE — H&P (Addendum)
History and Physical    Paula Andrews JAS:[id number] DOB: [birth year] DOA: 03/13/2018  PCP: Celene Squibb, MD Patient coming from: PCPs office  Chief Complaint: Presenting from PCPs office for evaluation of syncopal episode this morning  HPI: Paula Andrews is a 72 y.o. female with medical history significant of dementia, COPD, type 2 diabetes, hypertension, hypothyroidism presenting from her PCPs office for evaluation of a syncopal episode this morning.  Per triage note, patient's O2 saturation was 66% at home.  At PCPs office, blood pressure 88/40, heart rate 42, and oxygen saturation 93% on room air at rest but 85% with exertion.  Patient was confused and no history could be obtained from her.  She is not sure why she is here.  Denies having any chest pain, shortness of breath, or abdominal pain.  She has no complaints.  Per husband at bedside, he noticed that the patient was appearing pale this morning and weak.  He found her on the floor and patient had lost control of her bowel and bladder.  He is not sure if she had lost consciousness or how exactly she fell.  He did not notice any shaking of her body.  Does state that patient has been having decreased p.o. intake and has been very sleepy recently.  She has COPD for which she does not use any inhalers.  She is supposed to use CPAP but has not been using it.  States patient normally does not use home oxygen but has been requiring it at night for the past 2 weeks.  He noticed that her oxygen saturation was 66% at rest this morning.  States she takes Synthroid at home and there was no recent change in the dose of the medication.  She has not taken trazodone for several weeks.  ED Course: Heart rate in the 50s to 60s (on beta blocker at home).  SPO2 84% on room air, currently on 2 L supplemental oxygen.  Blood pressure 106/41 on arrival.  Blood glucose 141.  Afebrile and no leukocytosis.  UA not suggestive of infection.  LFTs normal.  I-STAT troponin  negative.  EKG showing sinus bradycardia (heart rate 52); not suggestive of ACS.  VBG showing pH 7.27, pCO2 73.  Chest x-ray showing borderline cardiomegaly, mild chronic bronchitic changes without focal consolidation.  Head CT showing no acute intracranial abnormalities.  Review of Systems: As per HPI otherwise 10 point review of systems negative.  Past Medical History:  Diagnosis Date  . Dementia ([place])   . Diabetes mellitus   . Hypertension   . Thyroid disease     Past Surgical History:  Procedure Laterality Date  . ABDOMINAL HYSTERECTOMY       reports that she quit smoking about 16 years ago. Her smoking use included cigarettes. She has a 41.00 pack-year smoking history. She has never used smokeless tobacco. She reports that she does not drink alcohol or use drugs.  No Known Allergies  Family History  Problem Relation Age of Onset  . Diabetes Unknown     Prior to Admission medications   Medication Sig Start Date End Date Taking? Authorizing Provider  acetaminophen (TYLENOL) 325 MG tablet Take 650 mg by mouth 2 (two) times daily as needed for mild pain or moderate pain.   Yes [provider]  albuterol (PROVENTIL HFA;VENTOLIN HFA) 108 (90 Base) MCG/ACT inhaler Inhale 2 puffs into the lungs every 6 (six) hours as needed for wheezing or shortness of breath. 06/01/17  Yes Sheikh,  Omair Latif, DO  amLODipine (NORVASC) 5 MG tablet Take 1 tablet (5 mg total) by mouth daily. 05/19/17 08/07/18 Yes Johnson, Clanford L, MD  donepezil (ARICEPT) 10 MG tablet Take 10 mg by mouth at bedtime.   Yes [provider]  levothyroxine (SYNTHROID, LEVOTHROID) 75 MCG tablet Take 75 mcg by mouth daily.    Yes [provider]  lisinopril (PRINIVIL,ZESTRIL) 5 MG tablet Take 5 mg by mouth daily.  05/26/17  Yes [provider]  memantine (NAMENDA) 10 MG tablet Take 10 mg by mouth 2 (two) times daily. 02/22/18  Yes [provider]  metFORMIN (GLUCOPHAGE) 500 MG tablet  Take 500 mg by mouth daily as needed (for high blood sugars).    Yes [provider]  metoprolol tartrate (LOPRESSOR) 25 MG tablet Take 25 mg by mouth 2 (two) times daily. 01/24/18  Yes [provider]  OLANZapine (ZYPREXA) 2.5 MG tablet Take 1 tablet by mouth at bedtime. 01/10/18  Yes [provider]  ondansetron (ZOFRAN) 4 MG tablet Take 4 mg by mouth every 8 (eight) hours as needed for nausea or vomiting.  05/26/17  Yes [provider]  pravastatin (PRAVACHOL) 20 MG tablet Take 20 mg by mouth every morning.    Yes [provider]  traZODone (DESYREL) 50 MG tablet Take 50 mg by mouth at bedtime as needed for sleep.  01/10/18 03/13/18 Yes [provider]    Physical Exam: Vitals:   03/13/18 1730 03/13/18 1800 03/13/18 1830 03/13/18 1900  BP: (!) 146/52 (!) 145/52 (!) 155/43 (!) 142/59  Pulse: (!) 55 (!) 50 (!) 51 (!) 52  Resp: 19 (!) 22 19 15   Temp:      TempSrc:      SpO2: 97% 94% 97% 96%  Weight:      Height:        Physical Exam  Constitutional: She appears well-developed and well-nourished. No distress.  Resting comfortably in a hospital stretcher  HENT:  Head: Normocephalic.  Mouth/Throat: Oropharynx is clear and moist.  Eyes: Pupils are equal, round, and reactive to light. EOM are normal.  Neck: Neck supple.  Cardiovascular: Normal rate, regular rhythm and intact distal pulses.  Pulmonary/Chest: Effort normal. She has no wheezes.  Mild bibasilar rales appreciated Speaking clearly in full sentences No increased work of breathing On 2 L supplemental oxygen  Abdominal: Soft. Bowel sounds are normal. She exhibits no distension. There is no tenderness. There is no guarding.  Musculoskeletal: She exhibits no edema.  Calves appear symmetric in size.  No tenderness, erythema, increased warmth, or edema.  Neurological:  Awake and alert Oriented to self only (baseline per family) No slurring of speech No facial  droop Strength 5 out of 5 in bilateral upper and lower extremities. Sensation to light touch intact throughout.  Skin: Skin is warm and dry. She is not diaphoretic.  Psychiatric: She has a normal mood and affect. Her behavior is normal.     Labs on Admission: I have personally reviewed following labs and imaging studies  CBC: Recent Labs  Lab 03/13/18 1455  WBC 8.1  NEUTROABS 5.8  HGB 13.5  HCT 45.3  MCV 101.1*  PLT 629   Basic Metabolic Panel: Recent Labs  Lab 03/13/18 1455  NA 141  K 4.0  CL 101  CO2 30  GLUCOSE 141*  BUN 15  CREATININE 0.77  CALCIUM 9.0   GFR: Estimated Creatinine Clearance: 55.7 mL/min (by C-G formula based on SCr of 0.77 mg/dL). Liver  Function Tests: Recent Labs  Lab 03/13/18 1455  AST 14*  ALT 11  ALKPHOS 68  BILITOT 0.8  PROT 6.9  ALBUMIN 4.2   No results for input(s): LIPASE, AMYLASE in the last 168 hours. No results for input(s): AMMONIA in the last 168 hours. Coagulation Profile: No results for input(s): INR, PROTIME in the last 168 hours. Cardiac Enzymes: Recent Labs  Lab 03/13/18 1455  TROPONINI <0.03   BNP (last 3 results) No results for input(s): PROBNP in the last 8760 hours. HbA1C: No results for input(s): HGBA1C in the last 72 hours. CBG: Recent Labs  Lab 03/13/18 1524  GLUCAP 140*   Lipid Profile: No results for input(s): CHOL, HDL, LDLCALC, TRIG, CHOLHDL, LDLDIRECT in the last 72 hours. Thyroid Function Tests: No results for input(s): TSH, T4TOTAL, FREET4, T3FREE, THYROIDAB in the last 72 hours. Anemia Panel: No results for input(s): VITAMINB12, FOLATE, FERRITIN, TIBC, IRON, RETICCTPCT in the last 72 hours. Urine analysis:    Component Value Date/Time   COLORURINE YELLOW 03/13/2018 1444   APPEARANCEUR CLEAR 03/13/2018 1444   LABSPEC 1.025 03/13/2018 1444   PHURINE 5.5 03/13/2018 1444   GLUCOSEU NEGATIVE 03/13/2018 1444   HGBUR NEGATIVE 03/13/2018 1444   BILIRUBINUR NEGATIVE 03/13/2018 1444    KETONESUR NEGATIVE 03/13/2018 1444   PROTEINUR NEGATIVE 03/13/2018 1444   NITRITE NEGATIVE 03/13/2018 1444   LEUKOCYTESUR NEGATIVE 03/13/2018 1444    Radiological Exams on Admission: Ct Head Wo Contrast  Result Date: 03/13/2018 CLINICAL DATA:  72 year old female with history of syncopal event this morning. Incontinent of bowel and bladder. EXAM: CT HEAD WITHOUT CONTRAST TECHNIQUE: Contiguous axial images were obtained from the base of the skull through the vertex without intravenous contrast. COMPARISON:  Head CT 01/20/2018. FINDINGS: Brain: Mild cerebral atrophy. Patchy and confluent areas of decreased attenuation are noted throughout the deep and periventricular white matter of the cerebral hemispheres bilaterally, compatible with chronic microvascular ischemic disease. More focal area of hypoattenuation in the white matter of the left occipital lobe, similar to the prior examination, compatible with encephalomalacia/gliosis from remote infarction. No evidence of acute infarction, hemorrhage, hydrocephalus, extra-axial collection or mass lesion/mass effect. Vascular: No hyperdense vessel or unexpected calcification. Skull: Normal. Negative for fracture or focal lesion. Sinuses/Orbits: No acute finding. Other: None. IMPRESSION: 1. No acute intracranial abnormalities. 2. 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 Electronically Signed   By: Vinnie Langton M.D.   On: 03/13/2018 15:54   Dg Chest Port 1 View  Result Date: 03/13/2018 CLINICAL DATA:  Syncopal episode this morning, incontinence and hypoxia. EXAM: PORTABLE CHEST 1 VIEW COMPARISON:  Chest radiograph January 03, 2018 FINDINGS: Cardiac silhouette is upper limits of normal size. Calcified aortic arch. Mild chronic bronchitic changes. No pleural effusion or focal consolidation. No pneumothorax. Soft tissue planes and included osseous structures are  nonacute. IMPRESSION: Borderline cardiomegaly. Mild chronic bronchitic changes without focal consolidation. Electronically Signed   By: Elon Alas M.D.   On: 03/13/2018 15:10    EKG: Independently reviewed.  Sinus bradycardia (heart rate 52).   Assessment/Plan Principal Problem:   Acute respiratory failure with hypoxia ([facility]) Active Problems:   HTN (hypertension)   Diabetes mellitus type II, controlled ([place])   HLD (hyperlipidemia)   Acute encephalopathy   Fall   Sinus bradycardia   Dementia (HCC)   Hypothyroidism   Mood disorder ([place])   Acute hypoxic respiratory failure Unclear etiology. SPO2 84% on room air  on arrival. VBG showing pH 7.27, pCO2 73. Currently satting well on 2 L supplemental oxygen.  No increased work of breathing on exam.  No wheezing appreciated. -Chest x-ray showing borderline cardiomegaly, mild chronic bronchitic changes without focal consolidation.  Patient is afebrile and does not have leukocytosis.  Does not appear volume overloaded on exam. -Will treat with steroid and duonebs for possible COPD exacerbation and acute on chronic bronchitis.  Give Solu-Medrol 125 mg today, continue prednisone 40 mg daily starting tomorrow.  Duonebs every 6 hours as needed. -I-STAT troponin negative and EKG not suggestive of ACS. -PE is on the differential, however, low probability as no clinical signs of DVT noted on exam.  Check d-dimer level. -Supplemental oxygen  Addendum: D-dimer elevated at 2.81.  Creatinine 0.7 and GFR greater than 60.  CT angiogram has been ordered to rule out PE in the setting of hypoxia and suspected syncopal episode at home.  Acute encephalopathy, fall/ ?syncope Unclear from history whether patient lost consciousness.  Fall possibly orthostatic as family reports decreased p.o. intake and patient was hypotensive at PCPs office.  Diastolic low but patient is maintaining her systolic in the 947M to 546T at present. Head CT showing no acute intracranial  abnormalities.  No focal neuro deficits appreciated on exam.  Husband reports loss of bowel and bladder control, however, no tonic-clonic jerking noted.  Patient is on thyroid hormone replacement therapy. TSH checked January 20, 2018 was normal.  Review of home medication list revealed trazodone as a possible sedating medication but husband stated that patient has not taken this medication in several weeks.  Infectious etiology less likely as patient is afebrile and does not have leukocytosis.  Chest x-ray not suggestive of pneumonia.  UA not suggestive of infection.  LFTs normal.  Not hypoglycemic on arrival.  I-STAT troponin negative and EKG not suggestive of ACS.  Patient is currently awake, alert, and oriented to self only which seems to be her baseline per family. -Last echo done in February 2019 showing normal systolic function, grade 1 diastolic dysfunction, and no evidence of aortic stenosis. -Check EEG -Recheck TSH level -Check B12 level -Check orthostatics -IV fluid hydration -PT evaluation -Monitor on telemetry  Sinus bradycardia Per triage note, heart rate 42 at PCPs office.  EKG showing sinus bradycardia (heart rate 52). Heart rate in the 50s to 60s here in the setting of home beta-blocker use.  Systolic in the 035W to 656C at present. -Will hold beta-blocker at this time.  Consider resuming at a lower dose as bradycardia might be contributing to the patient's lethargy.  -Monitor on telemetry -Check TSH level  Type 2 diabetes -Check A1c -Sliding scale insulin sensitive -CBG checks -Hold home metformin  Dementia -Continue home donezepil, memantine  Hypothyroidism -Continue home Synthroid 75 mcg daily -Check TSH level  Mood disorder -Continue home Zyprexa  Hyperlipidemia -Continue home pravastatin  Hypertension Patient was hypotensive at PCPs office.  Systolic currently in 127N to 170Y; diastolic blood pressure low. -Hold home antihypertensives at this time  DVT  prophylaxis: Lovenox Code Status: Full code.  Discussed with patient and family at bedside. Family Communication: Husband and other family members at bedside. Disposition Plan: Anticipate discharge to home versus SNF in 1 to 2 days. Consults called: None Admission status: Observation   Shela Leff MD [facility] Pager [phone]  If 7PM-7AM, please contact night-coverage [email or web address] Password [facility]  03/13/2018, 7:22 PM

## 2018-03-13 NOTE — ED Notes (Signed)
Respiratory notified of breathing treatment.

## 2018-03-14 ENCOUNTER — Inpatient Hospital Stay (HOSPITAL_COMMUNITY)
Admit: 2018-03-14 | Discharge: 2018-03-14 | Disposition: A | Discharge status: Home / Self Care | Payer: Medicare Other | Attending: Internal Medicine | Admitting: Internal Medicine

## 2018-03-14 DIAGNOSIS — Z87891 Personal history of nicotine dependence: Secondary | ICD-10-CM | POA: Diagnosis not present

## 2018-03-14 DIAGNOSIS — F0391 Unspecified dementia with behavioral disturbance: Secondary | ICD-10-CM

## 2018-03-14 DIAGNOSIS — E119 Type 2 diabetes mellitus without complications: Secondary | ICD-10-CM | POA: Diagnosis present

## 2018-03-14 DIAGNOSIS — J9601 Acute respiratory failure with hypoxia: Secondary | ICD-10-CM

## 2018-03-14 DIAGNOSIS — G4733 Obstructive sleep apnea (adult) (pediatric): Secondary | ICD-10-CM | POA: Diagnosis present

## 2018-03-14 DIAGNOSIS — E785 Hyperlipidemia, unspecified: Secondary | ICD-10-CM | POA: Diagnosis not present

## 2018-03-14 DIAGNOSIS — I2699 Other pulmonary embolism without acute cor pulmonale: Secondary | ICD-10-CM | POA: Diagnosis not present

## 2018-03-14 DIAGNOSIS — E039 Hypothyroidism, unspecified: Secondary | ICD-10-CM | POA: Diagnosis not present

## 2018-03-14 DIAGNOSIS — Z9981 Dependence on supplemental oxygen: Secondary | ICD-10-CM | POA: Diagnosis not present

## 2018-03-14 DIAGNOSIS — E118 Type 2 diabetes mellitus with unspecified complications: Secondary | ICD-10-CM

## 2018-03-14 DIAGNOSIS — F39 Unspecified mood [affective] disorder: Secondary | ICD-10-CM | POA: Diagnosis present

## 2018-03-14 DIAGNOSIS — E538 Deficiency of other specified B group vitamins: Secondary | ICD-10-CM | POA: Diagnosis present

## 2018-03-14 DIAGNOSIS — R0902 Hypoxemia: Secondary | ICD-10-CM | POA: Diagnosis not present

## 2018-03-14 DIAGNOSIS — Z79899 Other long term (current) drug therapy: Secondary | ICD-10-CM | POA: Diagnosis not present

## 2018-03-14 DIAGNOSIS — G9341 Metabolic encephalopathy: Secondary | ICD-10-CM | POA: Diagnosis present

## 2018-03-14 DIAGNOSIS — J9622 Acute and chronic respiratory failure with hypercapnia: Secondary | ICD-10-CM | POA: Diagnosis present

## 2018-03-14 DIAGNOSIS — G9340 Encephalopathy, unspecified: Secondary | ICD-10-CM

## 2018-03-14 DIAGNOSIS — Z9119 Patient's noncompliance with other medical treatment and regimen: Secondary | ICD-10-CM | POA: Diagnosis not present

## 2018-03-14 DIAGNOSIS — J441 Chronic obstructive pulmonary disease with (acute) exacerbation: Secondary | ICD-10-CM | POA: Diagnosis not present

## 2018-03-14 DIAGNOSIS — Z66 Do not resuscitate: Secondary | ICD-10-CM | POA: Diagnosis present

## 2018-03-14 DIAGNOSIS — I10 Essential (primary) hypertension: Secondary | ICD-10-CM

## 2018-03-14 DIAGNOSIS — Z7951 Long term (current) use of inhaled steroids: Secondary | ICD-10-CM | POA: Diagnosis not present

## 2018-03-14 DIAGNOSIS — Z7984 Long term (current) use of oral hypoglycemic drugs: Secondary | ICD-10-CM | POA: Diagnosis not present

## 2018-03-14 DIAGNOSIS — J9621 Acute and chronic respiratory failure with hypoxia: Secondary | ICD-10-CM | POA: Diagnosis not present

## 2018-03-14 LAB — BLOOD GAS, ARTERIAL
ACID-BASE EXCESS: 3.4 mmol/L — AB (ref 0.0–2.0)
Acid-Base Excess: 3.9 mmol/L — ABNORMAL HIGH (ref 0.0–2.0)
Bicarbonate: 25.6 mmol/L (ref 20.0–28.0)
Bicarbonate: 26.5 mmol/L (ref 20.0–28.0)
Delivery systems: POSITIVE
Delivery systems: POSITIVE
Drawn by: 28459
Drawn by: 28459
Expiratory PAP: 6
FIO2: 28
Inspiratory PAP: 12
O2 Content: 4 L/min
O2 SAT: 93.3 %
O2 Saturation: 96.5 %
PATIENT TEMPERATURE: 37
PH ART: 7.272 — AB (ref 7.350–7.450)
PO2 ART: 71.7 mmHg — AB (ref 83.0–108.0)
Patient temperature: 37
pCO2 arterial: 66.7 mmHg (ref 32.0–48.0)
pCO2 arterial: 62.6 mmHg — ABNORMAL HIGH (ref 32.0–48.0)
pH, Arterial: 7.3 — ABNORMAL LOW (ref 7.350–7.450)
pO2, Arterial: 86.5 mmHg (ref 83.0–108.0)

## 2018-03-14 LAB — HEPARIN LEVEL (UNFRACTIONATED)
Heparin Unfractionated: 1.36 IU/mL — ABNORMAL HIGH (ref 0.30–0.70)
Heparin Unfractionated: 0.47 IU/mL (ref 0.30–0.70)

## 2018-03-14 LAB — GLUCOSE, CAPILLARY
GLUCOSE-CAPILLARY: 171 mg/dL — AB (ref 70–99)
Glucose-Capillary: 175 mg/dL — ABNORMAL HIGH (ref 70–99)
Glucose-Capillary: 158 mg/dL — ABNORMAL HIGH (ref 70–99)
Glucose-Capillary: 235 mg/dL — ABNORMAL HIGH (ref 70–99)

## 2018-03-14 LAB — TSH: TSH: 0.845 u[IU]/mL (ref 0.350–4.500)

## 2018-03-14 LAB — APTT: aPTT: 53 seconds — ABNORMAL HIGH (ref 24–36)

## 2018-03-14 LAB — PROTIME-INR
INR: 0.95
Prothrombin Time: 12.6 seconds (ref 11.4–15.2)

## 2018-03-14 LAB — VITAMIN B12: VITAMIN B 12: 167 pg/mL — AB (ref 180–914)

## 2018-03-14 LAB — HEMOGLOBIN A1C
Hgb A1c MFr Bld: 5.9 % — ABNORMAL HIGH (ref 4.8–5.6)
Mean Plasma Glucose: 122.63 mg/dL

## 2018-03-14 MED ORDER — HEPARIN BOLUS VIA INFUSION
4000.0000 [IU] | Freq: Once | INTRAVENOUS | Status: AC
  Administered 2018-03-14: 4000 [IU] via INTRAVENOUS
  Filled 2018-03-14: qty 4000

## 2018-03-14 MED ORDER — ALBUTEROL SULFATE (2.5 MG/3ML) 0.083% IN NEBU: 2.5000 mg | INHALATION_SOLUTION | RESPIRATORY_TRACT | Status: DC | PRN

## 2018-03-14 MED ORDER — IPRATROPIUM-ALBUTEROL 0.5-2.5 (3) MG/3ML IN SOLN
3.0000 mL | Freq: Four times a day (QID) | RESPIRATORY_TRACT | Status: DC
  Administered 2018-03-14 – 2018-03-16 (×8): 3 mL via RESPIRATORY_TRACT
  Filled 2018-03-14 (×8): qty 3

## 2018-03-14 MED ORDER — HEPARIN (PORCINE) 25000 UT/250ML-% IV SOLN
600.0000 [IU]/h | INTRAVENOUS | Status: DC
  Administered 2018-03-14 – 2018-03-15 (×2): 600 [IU]/h via INTRAVENOUS
  Filled 2018-03-14: qty 250

## 2018-03-14 MED ORDER — HYDRALAZINE HCL 25 MG PO TABS
25.0000 mg | ORAL_TABLET | Freq: Four times a day (QID) | ORAL | Status: DC | PRN
  Administered 2018-03-17: 25 mg via ORAL
  Filled 2018-03-14: qty 1

## 2018-03-14 MED ORDER — HEPARIN (PORCINE) 25000 UT/250ML-% IV SOLN
1200.0000 [IU]/h | INTRAVENOUS | Status: DC
  Administered 2018-03-14: 1200 [IU]/h via INTRAVENOUS

## 2018-03-14 MED ORDER — METHYLPREDNISOLONE SODIUM SUCC 125 MG IJ SOLR
60.0000 mg | Freq: Three times a day (TID) | INTRAMUSCULAR | Status: DC
  Administered 2018-03-14 – 2018-03-17 (×11): 60 mg via INTRAVENOUS
  Filled 2018-03-14 (×11): qty 2

## 2018-03-14 MED ORDER — HEPARIN BOLUS VIA INFUSION
4000.0000 [IU] | Freq: Once | INTRAVENOUS | Status: DC
  Filled 2018-03-14: qty 4000

## 2018-03-14 MED ORDER — CYANOCOBALAMIN 1000 MCG/ML IJ SOLN
1000.0000 ug | Freq: Every day | INTRAMUSCULAR | Status: DC
  Administered 2018-03-14 – 2018-03-17 (×4): 1000 ug via INTRAMUSCULAR
  Filled 2018-03-14 (×4): qty 1

## 2018-03-14 MED ORDER — HEPARIN (PORCINE) 25000 UT/250ML-% IV SOLN
INTRAVENOUS | Status: AC
  Administered 2018-03-14: 03:00:00
  Filled 2018-03-14: qty 250

## 2018-03-14 MED ORDER — BUDESONIDE 0.5 MG/2ML IN SUSP
0.5000 mg | Freq: Two times a day (BID) | RESPIRATORY_TRACT | Status: DC
  Administered 2018-03-14 – 2018-03-17 (×7): 0.5 mg via RESPIRATORY_TRACT
  Filled 2018-03-14 (×7): qty 2

## 2018-03-14 MED ORDER — PANTOPRAZOLE SODIUM 40 MG PO TBEC
40.0000 mg | DELAYED_RELEASE_TABLET | Freq: Every day | ORAL | Status: DC
  Administered 2018-03-14 – 2018-03-17 (×4): 40 mg via ORAL
  Filled 2018-03-14 (×4): qty 1

## 2018-03-14 MED ORDER — INSULIN DETEMIR 100 UNIT/ML ~~LOC~~ SOLN
5.0000 [IU] | Freq: Every day | SUBCUTANEOUS | Status: DC
  Administered 2018-03-14 – 2018-03-16 (×3): 5 [IU] via SUBCUTANEOUS
  Filled 2018-03-14 (×4): qty 0.05

## 2018-03-14 NOTE — Progress Notes (Signed)
EEG completed; results pending.    

## 2018-03-14 NOTE — Progress Notes (Signed)
CRITICAL VALUE ALERT  Critical Value:  CO2 66.7   PH 7.272   PO2 71.2    Bicarb 25.6    O2 saturation 93.3   Date & Time Notied:  03/14/2018 0905  Provider Notified: Dr. Dyann Kief  Orders Received/Actions taken: awaiting call back/ orders

## 2018-03-14 NOTE — Progress Notes (Signed)
PROGRESS NOTE    Paula Andrews  YBO:[id number] DOB: 1945/09/29 DOA: 03/13/2018 PCP: Celene Squibb, MD     Brief Narrative:  72 year old female with medical history significant for dementia, COPD (supposedly in need of chronic oxygen supplementation at home 2-3 L; but no compliance), hypertension, type 2 diabetes, hypothyroidism and obstructive sleep apnea (not compliant with CPAP); who presented to the emergency department secondary to shortness of breath, soft blood pressure, tachypnea and oxygen desaturation.  Per family patient also experienced an episode of syncope in the morning of the day of admission.    Work-up has demonstrated acute pulmonary embolism without right heart strain, COPD exacerbation and hypercapnia.  Assessment & Plan: 1-Acute respiratory failure with hypoxia and hypercapnia (HCC) -Move to stepdown -Continue BiPAP -Follow ABG -Continue treatment for COPD exacerbation using steroids and nebulizer treatment.  2-HTN (hypertension) -Stable overall -Continue current antihypertensive regimen -Follow vital signs.  3-Diabetes mellitus type II, controlled ([place]) -Follow A1c -Continue sliding scale insulin and use low-dose Lantus -Anticipate CBGs to be elevated while using steroids. -Follow blood glucose trend and adjust hypoglycemic regimen as needed.  4-HLD (hyperlipidemia) -Continue statins  5-Acute encephalopathy -In the setting of respiratory failure and hypercapnia -Continue treatment with BiPAP and follow ABG -Minimize the use of sedative medications.  6-Dementia with behavioral disturbances:(HCC) -Continue the use of Namenda and Zyprexa -Provide supportive care will follow an improvement in mentation.  7-B12 deficiency -Initiate B12 repletion therapy.  8-Hypothyroidism -Continue Synthroid.  9-GERD -continue PPI  DVT prophylaxis: Heparin drip Code Status: Full code Family Communication: Daughter and husband at bedside Disposition Plan:  Remains inpatient; transfer to stepdown bed.  Placed on BiPAP and follow ABG.  Close monitoring to follow clinical response.  Continue heparin drip for treatment of PE and initiate nebulizer and steroids for COPD  Consultants:   None  Procedures:   See below for x-ray reports.  Antimicrobials:  Anti-infectives (From admission, onward)   None     Subjective: Afebrile, no chest pain, no nausea, no vomiting.  Patient is somnolent/lethargic even able to follow simple commands.  Oxygen saturation has dropped into the 70's to low 80s.  Objective: Vitals:   03/13/18 1900 03/13/18 2019 03/14/18 0547 03/14/18 0945  BP: (!) 142/59 (!) 159/47 (!) 166/73   Pulse: (!) 52 (!) 56 65   Resp: 15 18 18    Temp:  97.9 F (36.6 C) 97.8 F (36.6 C)   TempSrc:  Oral Oral   SpO2: 96% 94% 94% 95%  Weight:  69.2 kg    Height:  5\' 4"  (1.626 m)      Intake/Output Summary (Last 24 hours) at 03/14/2018 1108 Last data filed at 03/14/2018 0500 Gross per 24 hour  Intake 6118.17 ml  Output -  Net 6118.17 ml   Filed Weights   03/13/18 1441 03/13/18 2019  Weight: 63.5 kg 69.2 kg    Examination: General exam: Oriented x2; very lethargic at this moment but following simple commands.  Denies nausea, chest pain, abdominal pain.  Patient is afebrile. Respiratory system: Fair air movement bilaterally, positive expiratory wheezing. Cardiovascular system: Controlled, no murmurs, rubs or gallops. Gastrointestinal system: Abdomen is nondistended, soft and nontender. No organomegaly or masses felt. Normal bowel sounds heard. Central nervous system: Somnolent but able to follow simple commands. No focal neurological deficits.  Moving 4 limbs spontaneously. Extremities: No cyanosis or clubbing. Skin: No rashes, lesions or ulcers Psychiatry: Judgement and insight appear normal. Mood & affect appropriate.    Data Reviewed: I  have personally reviewed following labs and imaging studies  CBC: Recent Labs  Lab  03/13/18 1455  WBC 8.1  NEUTROABS 5.8  HGB 13.5  HCT 45.3  MCV 101.1*  PLT 536   Basic Metabolic Panel: Recent Labs  Lab 03/13/18 1455  NA 141  K 4.0  CL 101  CO2 30  GLUCOSE 141*  BUN 15  CREATININE 0.77  CALCIUM 9.0   GFR: Estimated Creatinine Clearance: 61.6 mL/min (by C-G formula based on SCr of 0.77 mg/dL).   Liver Function Tests: Recent Labs  Lab 03/13/18 1455  AST 14*  ALT 11  ALKPHOS 68  BILITOT 0.8  PROT 6.9  ALBUMIN 4.2   Coagulation Profile: Recent Labs  Lab 03/14/18 0147  INR 0.95   Cardiac Enzymes: Recent Labs  Lab 03/13/18 1455  TROPONINI <0.03   CBG: Recent Labs  Lab 03/13/18 1524 03/13/18 2120 03/14/18 0743  GLUCAP 140* 233* 171*   Thyroid Function Tests: Recent Labs    03/13/18 1455  TSH 0.845   Anemia Panel: Recent Labs    03/13/18 1455  VITAMINB12 167*   Urine analysis:    Component Value Date/Time   COLORURINE YELLOW 03/13/2018 [place] 03/13/2018 1444   LABSPEC 1.025 03/13/2018 1444   PHURINE 5.5 03/13/2018 1444   GLUCOSEU NEGATIVE 03/13/2018 1444   HGBUR NEGATIVE 03/13/2018 1444   BILIRUBINUR NEGATIVE 03/13/2018 1444   KETONESUR NEGATIVE 03/13/2018 1444   PROTEINUR NEGATIVE 03/13/2018 1444   NITRITE NEGATIVE 03/13/2018 1444   LEUKOCYTESUR NEGATIVE 03/13/2018 1444    Radiology Studies: Ct Head Wo Contrast  Result Date: 03/13/2018 CLINICAL DATA:  72 year old female with history of syncopal event this morning. Incontinent of bowel and bladder. EXAM: CT HEAD WITHOUT CONTRAST TECHNIQUE: Contiguous axial images were obtained from the base of the skull through the vertex without intravenous contrast. COMPARISON:  Head CT 01/20/2018. FINDINGS: Brain: Mild cerebral atrophy. Patchy and confluent areas of decreased attenuation are noted throughout the deep and periventricular white matter of the cerebral hemispheres bilaterally, compatible with chronic microvascular ischemic disease. More focal area of  hypoattenuation in the white matter of the left occipital lobe, similar to the prior examination, compatible with encephalomalacia/gliosis from remote infarction. No evidence of acute infarction, hemorrhage, hydrocephalus, extra-axial collection or mass lesion/mass effect. Vascular: No hyperdense vessel or unexpected calcification. Skull: Normal. Negative for fracture or focal lesion. Sinuses/Orbits: No acute finding. Other: None. IMPRESSION: 1. No acute intracranial abnormalities. 2. 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 Electronically Signed   By: Vinnie Langton M.D.   On: 03/13/2018 15:54   Ct Angio Chest Pe W Or Wo Contrast  Result Date: 03/14/2018 CLINICAL DATA:  Hypoxia. Found on the floor. EXAM: CT ANGIOGRAPHY CHEST WITH CONTRAST TECHNIQUE: Multidetector CT imaging of the chest was performed using the standard protocol during bolus administration of intravenous contrast. Multiplanar CT image reconstructions and MIPs were obtained to evaluate the vascular anatomy. CONTRAST:  151mL ISOVUE-370 IOPAMIDOL (ISOVUE-370) INJECTION 76% COMPARISON:  Portable chest dated 03/13/2018. Chest CTA dated 05/29/2017 and 07/15/2011. FINDINGS: Cardiovascular: Small number of small and small to moderate-sized filling defects in right lower lobe and right upper lobe pulmonary arteries. Atheromatous calcifications, including the coronary arteries and aorta. Mediastinum/Nodes: Mild diffuse esophageal wall thickening. No enlarged lymph nodes. Unremarkable thyroid gland. Lungs/Pleura: Mild diffuse peribronchial thickening. Mild diffuse bilateral bullous changes. Minimal bibasilar atelectasis. No pleural fluid. Upper Abdomen: No significant change  in 3 small oval areas of low density in the liver. These have not changed significantly since 07/15/2011. Stable small calcified granuloma in the spleen. Musculoskeletal: Thoracic and  lower cervical spine degenerative changes. Review of the MIP images confirms the above findings. IMPRESSION: 1. Small number of small and small to moderate-sized right lower lobe and right upper lobe pulmonary emboli. There is not enough clot burden to cause right heart strain. 2. Mild changes of COPD and chronic bronchitis. 3. Stable small liver cysts. 4. Mild diffuse esophageal wall thickening, most likely due to reflux esophagitis. Critical Value/emergent results were called by telephone at the time of interpretation on 03/14/2018 at 12:51 am to Dr. Elane Fritz, who verbally acknowledged these results. Aortic Atherosclerosis (ICD10-I70.0) and Emphysema (ICD10-J43.9). Electronically Signed   By: Claudie Revering M.D.   On: 03/14/2018 00:54   Dg Chest Port 1 View  Result Date: 03/13/2018 CLINICAL DATA:  Syncopal episode this morning, incontinence and hypoxia. EXAM: PORTABLE CHEST 1 VIEW COMPARISON:  Chest radiograph January 03, 2018 FINDINGS: Cardiac silhouette is upper limits of normal size. Calcified aortic arch. Mild chronic bronchitic changes. No pleural effusion or focal consolidation. No pneumothorax. Soft tissue planes and included osseous structures are nonacute. IMPRESSION: Borderline cardiomegaly. Mild chronic bronchitic changes without focal consolidation. Electronically Signed   By: Elon Alas M.D.   On: 03/13/2018 15:10    Scheduled Meds: . budesonide (PULMICORT) nebulizer solution  0.5 mg Nebulization BID  . cyanocobalamin  1,000 mcg Intramuscular Daily  . donepezil  10 mg Oral QHS  . insulin aspart  0-9 Units Subcutaneous TID WC  . ipratropium-albuterol  3 mL Nebulization QID  . levothyroxine  75 mcg Oral Q0600  . memantine  10 mg Oral BID  . methylPREDNISolone (SOLU-MEDROL) injection  60 mg Intravenous Q8H  . OLANZapine  2.5 mg Oral QHS  . pravastatin  20 mg Oral q1800   Continuous Infusions:   LOS: 0 days    Time spent: 35 minutes. Greater than 50% of this time was spent in  direct contact with the patient, coordinating care and discussing relevant ongoing clinical issues, including findings on CT Angie of her chest demonstrating pulmonary embolism and also causes behind acute respiratory failure with hypoxia and hypercapnia.  Family was updated at bedside; patient has been placed on BiPAP and will follow ABG results to further assess clinical response.Barton Dubois, MD [facility] Pager [phone]  If 7PM-7AM, please contact night-coverage [email or web address] Password [email or web address] 03/14/2018, 11:08 AM

## 2018-03-14 NOTE — Progress Notes (Signed)
Spoke to [employer], Therapist, sports, want to hold off on EEG until patient is transferred to ICU. Pt don't have a bed right this second. Will attempt EEG when schedule permits.

## 2018-03-14 NOTE — Progress Notes (Signed)
Sats noted to drop into the 70's, went in to arouse pt and noted to be lethargic vs alert and walking to bathroom during night. Per daughter in room, she stated at this time that she normally wears a bipap at home but does not always comply and sleeps alot. Notified respiratory of this, bipap placed. Notified Dr. Dyann Kief, stat abg ordered. Will continue to monitor.

## 2018-03-14 NOTE — Progress Notes (Signed)
ANTICOAGULATION CONSULT NOTE - Follow Up Consult  Pharmacy Consult for  Indication: heparin infusion for new-onset  PE  No Known Allergies  Patient Measurements: Height: 5\' 4"  (162.6 cm) Weight: 152 lb 8 oz (69.2 kg) IBW/kg (Calculated) : 54.7 Heparin Dosing Weight:  HEPARIN DW (KG): 68.6  Vital Signs: Temp: 97.8 F (36.6 C) (12/12 0547) Temp Source: Oral (12/12 0547) BP: 166/73 (12/12 0547) Pulse Rate: 65 (12/12 0547)  Labs: Recent Labs    03/13/18 1455 03/14/18 0147 03/14/18 0945 03/14/18 1440  HGB 13.5  --   --   --   HCT 45.3  --   --   --   PLT 206  --   --   --   APTT  --  53*  --   --   LABPROT  --  12.6  --   --   INR  --  0.95  --   --   HEPARINUNFRC  --   --  1.36* 0.47  CREATININE 0.77  --   --   --   TROPONINI <0.03  --   --   --     Estimated Creatinine Clearance: 61.6 mL/min (by C-G formula based on SCr of 0.77 mg/dL).    Assessment: Pharmacy consulted to dose heparin infusion in this 61 yof with PE, with a D-Dimer elevation of 2.81 mcg/mL-FEU. Heparin level this  Morning was elevated at 1.36 IU/mL.  Repeat heparin level is now 0.47 IU/mL, which is in desired therapeutic goal range.  Goal of Therapy:  Heparin level 0.3-0.7 units/ml Monitor platelets by anticoagulation protocol: Yes   Plan:   Continue heparin drip at 600 units/hr. Check  anti-Xa level daily.   Despina Pole 03/14/2018,3:50 PM

## 2018-03-14 NOTE — Progress Notes (Signed)
Pt placed on auto BIPAP due to decreased level of conscious with 4lpm o2 bleed in. HR 59 spo2 95%

## 2018-03-14 NOTE — Care Management (Signed)
Per Willeen Niece., w/silver script @800 [phone]: Co-pay amount fro Xeralto 20 MG. Daily $45.00  for 30 day supply. Co-pay amount for Eliquis  twice daily $45.00 for 30 day supply. NO PA required. Pharmacies in [place] are : [facility], [facility].  No. REF.# for this call.

## 2018-03-14 NOTE — Progress Notes (Signed)
Pt returned to BIPAP after transfer from 341. Pt was on 4L [place] for transfer and for EEG. ABG will be drawn in 4hrs per MD order

## 2018-03-14 NOTE — Progress Notes (Signed)
Patient transferred to ICU bed 12, report given to Loni Muse, RN at the bedside and all questions.

## 2018-03-14 NOTE — H&P (Signed)
ANTICOAGULATION CONSULT NOTE - Preliminary  Pharmacy Consult for Heparin Indication: DVT/Pulmonary embolism No Known Allergies  Patient Measurements: Height: 5\' 4"  (162.6 cm) Weight: 152 lb 8 oz (69.2 kg) IBW/kg (Calculated) : 54.7 HEPARIN DW (KG): 68.6   Vital Signs: Temp: 97.9 F (36.6 C) (12/11 2019) Temp Source: Oral (12/11 2019) BP: 159/47 (12/11 2019) Pulse Rate: 56 (12/11 2019)  Labs: Recent Labs    03/13/18 1455  HGB 13.5  HCT 45.3  PLT 206  CREATININE 0.77  TROPONINI <0.03   Estimated Creatinine Clearance: 61.6 mL/min (by C-G formula based on SCr of 0.77 mg/dL).  Medical History: Past Medical History:  Diagnosis Date  . Dementia ([place])   . Diabetes mellitus   . Hypertension   . Thyroid disease     Medications:  Enoxaparin 40 mg subcutaneously on admission  Assessment: 72 yo female who presented to the ED with acute respiratory failure and hypoxia. CT shows pulmonary emboli. Pharmacy has been consulted for IV heparin dosing.  Goal of Therapy:  Heparin level 0.3-0.7 units/ml Monitor platelets by anticoagulation protocol: Yes   Plan:  Heparin bolus 4000 units IV  x 1 Heparin infusion at 1200 units/hr Heparin level in 6-8 hrs  Preliminary review of pertinent patient information completed.  Forestine Na clinical pharmacist will complete review during morning rounds to assess the patient and finalize treatment regimen.  Norberto Sorenson, [facility] 03/14/2018,3:37 AM

## 2018-03-14 NOTE — Progress Notes (Signed)
ANTICOAGULATION CONSULT NOTE - Follow Up Consult  Pharmacy Consult for  Indication: heparin infusion for new-onset  PE  No Known Allergies  Patient Measurements: Height: 5\' 4"  (162.6 cm) Weight: 152 lb 8 oz (69.2 kg) IBW/kg (Calculated) : 54.7 Heparin Dosing Weight:  HEPARIN DW (KG): 68.6  Vital Signs: Temp: 97.8 F (36.6 C) (12/12 0547) Temp Source: Oral (12/12 0547) BP: 166/73 (12/12 0547) Pulse Rate: 65 (12/12 0547)  Labs: Recent Labs    03/13/18 1455 03/14/18 0147 03/14/18 0945  HGB 13.5  --   --   HCT 45.3  --   --   PLT 206  --   --   APTT  --  53*  --   LABPROT  --  12.6  --   INR  --  0.95  --   HEPARINUNFRC  --   --  1.36*  CREATININE 0.77  --   --   TROPONINI <0.03  --   --     Estimated Creatinine Clearance: 61.6 mL/min (by C-G formula based on SCr of 0.77 mg/dL).    Assessment: Pharmacy consulted to dose heparin infusion in this 10 yof with PE Patient has D-Dimer elevation of 2.81 mcg/mL-FEU. Heparin level of this morning is elevated at 1.36 IU/mL.   Goal of Therapy:  Heparin level 0.3-0.7 units/ml Monitor platelets by anticoagulation protocol: Yes   Plan:  Stop heparin infusion for one hour. At 1215 today, re-start heparin drip at 600 units/hr. Re-check anti-Xa level at 1300 today.   Despina Pole 03/14/2018,10:58 AM

## 2018-03-14 NOTE — Progress Notes (Signed)
Following ABG results, pt taken off BIPAP and placed on 3L [place] to eat dinner. Pt to return to BIPAP for sleep

## 2018-03-15 DIAGNOSIS — I2699 Other pulmonary embolism without acute cor pulmonale: Principal | ICD-10-CM

## 2018-03-15 DIAGNOSIS — J441 Chronic obstructive pulmonary disease with (acute) exacerbation: Secondary | ICD-10-CM

## 2018-03-15 DIAGNOSIS — J9622 Acute and chronic respiratory failure with hypercapnia: Secondary | ICD-10-CM

## 2018-03-15 DIAGNOSIS — J9621 Acute and chronic respiratory failure with hypoxia: Secondary | ICD-10-CM

## 2018-03-15 LAB — GLUCOSE, CAPILLARY
GLUCOSE-CAPILLARY: 269 mg/dL — AB (ref 70–99)
Glucose-Capillary: 162 mg/dL — ABNORMAL HIGH (ref 70–99)
Glucose-Capillary: 174 mg/dL — ABNORMAL HIGH (ref 70–99)
Glucose-Capillary: 263 mg/dL — ABNORMAL HIGH (ref 70–99)
Glucose-Capillary: 312 mg/dL — ABNORMAL HIGH (ref 70–99)

## 2018-03-15 LAB — BASIC METABOLIC PANEL
Anion gap: 7 (ref 5–15)
BUN: 19 mg/dL (ref 8–23)
CALCIUM: 9 mg/dL (ref 8.9–10.3)
CO2: 29 mmol/L (ref 22–32)
Chloride: 105 mmol/L (ref 98–111)
Creatinine, Ser: 0.81 mg/dL (ref 0.44–1.00)
GFR calc non Af Amer: >60 mL/min (ref >60)
Glucose, Bld: 179 mg/dL — ABNORMAL HIGH (ref 70–99)
Potassium: 4.4 mmol/L (ref 3.5–5.1)
SODIUM: 141 mmol/L (ref 135–145)

## 2018-03-15 LAB — CBC
HCT: 40.3 % (ref 36.0–46.0)
Hemoglobin: 12.1 g/dL (ref 12.0–15.0)
MCH: 29.7 pg (ref 26.0–34.0)
MCHC: 30 g/dL (ref 30.0–36.0)
MCV: 99 fL (ref 80.0–100.0)
NRBC: 0 % (ref 0.0–0.2)
Platelets: 213 10*3/uL (ref 150–400)
RBC: 4.07 MIL/uL (ref 3.87–5.11)
RDW: 13.2 % (ref 11.5–15.5)
WBC: 15.5 10*3/uL — ABNORMAL HIGH (ref 4.0–10.5)

## 2018-03-15 LAB — MRSA PCR SCREENING: MRSA by PCR: NEGATIVE

## 2018-03-15 LAB — HEPARIN LEVEL (UNFRACTIONATED): Heparin Unfractionated: 0.24 IU/mL — ABNORMAL LOW (ref 0.30–0.70)

## 2018-03-15 MED ORDER — RIVAROXABAN 20 MG PO TABS: 20.0000 mg | ORAL_TABLET | Freq: Every day | ORAL | Status: DC

## 2018-03-15 MED ORDER — RIVAROXABAN 15 MG PO TABS
15.0000 mg | ORAL_TABLET | Freq: Two times a day (BID) | ORAL | Status: DC
  Administered 2018-03-15 – 2018-03-17 (×6): 15 mg via ORAL
  Filled 2018-03-15 (×5): qty 1

## 2018-03-15 NOTE — Care Management Note (Signed)
Case Management Note  Patient Details  Name: Paula Andrews MRN: [id number] Date of Birth: Aug 13, 1945  Subjective/Objective:                 Admitted with respirator failure. Pt from home, lives with husband. Has strong family support. Daughter also at bedside. Pt used a cane pta. She has home oxygen and CPAP through [place], uses [place] for pharmacy. Pt needs HH PT and RW. Family has chosen Nurse, learning disability from [employer] of providers and [employer] from [employer].   Action/Plan: DC home tomorrow. Referral for [facility] given to Meredeth Ide Rep, aware DC planned for tomorrow. Referral for RW sent to [facility] with instructions to deliver to pt room.   Expected Discharge Date:      03/16/18            Expected Discharge Plan:  [place]  In-House Referral:  NA  Discharge planning Services  CM Consult  Post Acute Care Choice:  Durable Medical Equipment, Home Health Choice offered to:  Patient, Spouse  DME Arranged:  [place] DME Agency:  [facility]  HH Arranged:  PT [place] Agency:  [facility]  Status of Service:  Completed, signed off  If discussed at [place] of Stay Meetings, dates discussed:    Additional Comments:  Sherald Barge, RN 03/15/2018, 10:31 AM

## 2018-03-15 NOTE — Progress Notes (Signed)
ANTICOAGULATION CONSULT NOTE - Initial Consult  Pharmacy Consult for Xarelto Indication: pulmonary embolus  No Known Allergies  Patient Measurements: Height: 5\' 4"  (162.6 cm) Weight: 161 lb 13.1 oz (73.4 kg) IBW/kg (Calculated) : 54.7   Vital Signs: Temp: 97.7 F (36.5 C) (12/13 0759) Temp Source: Oral (12/13 0759) BP: 153/61 (12/13 0630) Pulse Rate: 74 (12/13 0759)  Labs: Recent Labs    03/13/18 1455 03/14/18 0147 03/14/18 0945 03/14/18 1440 03/15/18 0405 03/15/18 0915  HGB 13.5  --   --   --  12.1  --   HCT 45.3  --   --   --  40.3  --   PLT 206  --   --   --  213  --   APTT  --  53*  --   --   --   --   LABPROT  --  12.6  --   --   --   --   INR  --  0.95  --   --   --   --   HEPARINUNFRC  --   --  1.36* 0.47  --  0.24*  CREATININE 0.77  --   --   --  0.81  --   TROPONINI <0.03  --   --   --   --   --     Estimated Creatinine Clearance: 62.6 mL/min (by C-G formula based on SCr of 0.81 mg/dL).   Medical History: Past Medical History:  Diagnosis Date  . Dementia ([place])   . Diabetes mellitus   . Hypertension   . Thyroid disease     Medications:  Medications Prior to Admission  Medication Sig Dispense Refill Last Dose  . acetaminophen (TYLENOL) 325 MG tablet Take 650 mg by mouth 2 (two) times daily as needed for mild pain or moderate pain.   Past Week at Unknown time  . albuterol (PROVENTIL HFA;VENTOLIN HFA) 108 (90 Base) MCG/ACT inhaler Inhale 2 puffs into the lungs every 6 (six) hours as needed for wheezing or shortness of breath. 1 Inhaler 2 unknown  . amLODipine (NORVASC) 5 MG tablet Take 1 tablet (5 mg total) by mouth daily. 30 tablet 0 03/13/2018 at Unknown time  . donepezil (ARICEPT) 10 MG tablet Take 10 mg by mouth at bedtime.   03/12/2018 at Unknown time  . levothyroxine (SYNTHROID, LEVOTHROID) 75 MCG tablet Take 75 mcg by mouth daily.    03/13/2018 at Unknown time  . lisinopril (PRINIVIL,ZESTRIL) 5 MG tablet Take 5 mg by mouth daily.    03/13/2018  at Unknown time  . memantine (NAMENDA) 10 MG tablet Take 10 mg by mouth 2 (two) times daily.   03/13/2018 at Unknown time  . metFORMIN (GLUCOPHAGE) 500 MG tablet Take 500 mg by mouth daily as needed (for high blood sugars).    03/12/2018 at Unknown time  . metoprolol tartrate (LOPRESSOR) 25 MG tablet Take 25 mg by mouth 2 (two) times daily.   03/13/2018 at 1030a  . OLANZapine (ZYPREXA) 2.5 MG tablet Take 1 tablet by mouth at bedtime.  0 03/12/2018 at Unknown time  . ondansetron (ZOFRAN) 4 MG tablet Take 4 mg by mouth every 8 (eight) hours as needed for nausea or vomiting.    unknown  . pravastatin (PRAVACHOL) 20 MG tablet Take 20 mg by mouth every morning.    03/13/2018 at Unknown time  . traZODone (DESYREL) 50 MG tablet Take 50 mg by mouth at bedtime as needed for sleep.  unknown    Assessment: Pharmacy consulted to dose Xarelto for patient with pulmonary embolism.  Patient is being transitioned off of heparin to Xarelto.  Goal of Therapy:   Monitor platelets by anticoagulation protocol: Yes   Plan:  Xarelto 15 mg BID x 21 days followed by Xarelto 20 mg daily Monitor labs and s/s of bleeding  Revonda Standard Domitila Stetler 03/15/2018,10:18 AM

## 2018-03-15 NOTE — Evaluation (Signed)
Physical Therapy Evaluation Patient Details Name: Paula Andrews MRN: [id number] DOB: [birth year] Today's Date: 03/15/2018   History of Present Illness  Paula Andrews is a 72 y.o. female with medical history significant of dementia, COPD, type 2 diabetes, hypertension, hypothyroidism presenting from her PCPs office for evaluation of a syncopal episode this morning.  Patient was confused and no history could be obtained from her.  She is not sure why she is here.  Denies having any chest pain, shortness of breath, or abdominal pain.  She has no complaints.  Per husband at bedside, he noticed that the patient was appearing pale this morning and weak.  He found her on the floor and patient had lost control of her bowel and bladder.  He is not sure if she had lost consciousness or how exactly she fell.  He did not notice any shaking of her body.  Does state that patient has been having decreased p.o. intake and has been very sleepy recently.  She has COPD for which she does not use any inhalers.  She is supposed to use CPAP but has not been using it.  States patient normally does not use home oxygen but has been requiring it at night for the past 2 weeks.  He noticed that her oxygen saturation was 66% at rest this morning.  States she takes Synthroid at home and there was no recent change in the dose of the medication.  She has not taken trazodone for several weeks.Per triage note, patient's O2 saturation was 66% at home.  At PCPs office, blood pressure 88/40, heart rate 42, and oxygen saturation 93% on room air at rest but 85% with exertion.      Clinical Impression  Patient functioning near baseline for functional mobility and gait, demonstrates slightly unsteady cadence with occasional near stumbles, able to self correct, on room air initially but O2 saturation dropped from 95% to 87%, put on 2 LPM, O2 saturation still around 88%, required 3 LPM to keep above 91% during ambulation.  Patient tolerated  sitting up in chair with family present after therapy.  Patient will benefit from continued physical therapy in hospital and recommended venue below to increase strength, balance, endurance for safe ADLs and gait.     Follow Up Recommendations Home health PT;Supervision - Intermittent;Supervision for mobility/OOB    Equipment Recommendations  Rolling walker with 5" wheels    Recommendations for Other Services       Precautions / Restrictions Precautions Precautions: Fall Restrictions Weight Bearing Restrictions: No      Mobility  Bed Mobility Overal bed mobility: Needs Assistance Bed Mobility: Supine to Sit     Supine to sit: Supervision     General bed mobility comments: increased time  Transfers Overall transfer level: Needs assistance Equipment used: None;1 person hand held assist Transfers: Sit to/from Stand Sit to Stand: Supervision Stand pivot transfers: Min guard       General transfer comment: slightly labored movement  Ambulation/Gait Ambulation/Gait assistance: Min guard Gait Distance (Feet): 100 Feet Assistive device: None;1 person hand held assist Gait Pattern/deviations: Decreased step length - right;Decreased step length - left;Decreased stance time - left Gait velocity: decreased   General Gait Details: slightly unsteady slow cadence without occasional near stumbles, able to self correct and demonstrated improved balance when encouraged to let arms swing  Stairs            Wheelchair Mobility    Modified Rankin (Stroke Patients Only)  Balance Overall balance assessment: Mild deficits observed, not formally tested Sitting-balance support: Feet supported;No upper extremity supported Sitting balance-Leahy Scale: Good     Standing balance support: During functional activity;No upper extremity supported Standing balance-Leahy Scale: Fair Standing balance comment: slightly unsteady, but did not require use of AD                              Pertinent Vitals/Pain Pain Assessment: No/denies pain    Home Living Family/patient expects to be discharged to:: Private residence Living Arrangements: Spouse/significant other Available Help at Discharge: Family;Available 24 hours/day Type of Home: House Home Access: Stairs to enter Entrance Stairs-Rails: None Entrance Stairs-Number of Steps: 4 (back entrance) Home Layout: One level Home Equipment: None Additional Comments: per patient's spouse, has no AD'S    Prior Function Level of Independence: Needs assistance   Gait / Transfers Assistance Needed: household ambulator  ADL's / Homemaking Assistance Needed: assisted by family        Hand Dominance   Dominant Hand: Right    Extremity/Trunk Assessment   Upper Extremity Assessment Upper Extremity Assessment: Overall WFL for tasks assessed    Lower Extremity Assessment Lower Extremity Assessment: Generalized weakness    Cervical / Trunk Assessment Cervical / Trunk Assessment: Normal  Communication   Communication: No difficulties  Cognition Arousal/Alertness: Awake/alert Behavior During Therapy: WFL for tasks assessed/performed Overall Cognitive Status: Within Functional Limits for tasks assessed                                        General Comments      Exercises     Assessment/Plan    PT Assessment Patient needs continued PT services  PT Problem List Decreased strength;Decreased activity tolerance;Decreased mobility       PT Treatment Interventions Gait training;Stair training;Functional mobility training;Therapeutic activities;Patient/family education;Therapeutic exercise    PT Goals (Current goals can be found in the Care Plan section)  Acute Rehab PT Goals Patient Stated Goal: return home with family to assist PT Goal Formulation: With patient/family Time For Goal Achievement: 03/22/18 Potential to Achieve Goals: Good    Frequency Min 3X/week    Barriers to discharge        Co-evaluation               AM-PAC PT "6 Clicks" Mobility  Outcome Measure Help needed turning from your back to your side while in a flat bed without using bedrails?: None Help needed moving from lying on your back to sitting on the side of a flat bed without using bedrails?: None Help needed moving to and from a bed to a chair (including a wheelchair)?: A Little(slightly labored) Help needed standing up from a chair using your arms (e.g., wheelchair or bedside chair)?: A Little Help needed to walk in hospital room?: A Little Help needed climbing 3-5 steps with a railing? : A Little 6 Click Score: 20    End of Session Equipment Utilized During Treatment: Gait belt;Oxygen Activity Tolerance: Patient tolerated treatment well;Patient limited by fatigue Patient left: in chair;with call bell/phone within reach;with family/visitor present Nurse Communication: Mobility status PT Visit Diagnosis: Unsteadiness on feet (R26.81);Other abnormalities of gait and mobility (R26.89);Muscle weakness (generalized) (M62.81)    Time: 9518-8416 PT Time Calculation (min) (ACUTE ONLY): 35 min   Charges:   PT Evaluation $PT Eval Moderate Complexity: 1  Mod PT Treatments $Therapeutic Activity: 23-37 mins        1:57 PM, 03/15/18 Lonell Grandchild, MPT Physical Therapist with [facility] 336 [phone] office [phone] mobile phone

## 2018-03-15 NOTE — Procedures (Signed)
ELECTROENCEPHALOGRAM REPORT   Patient: Paula Andrews       Room #: IC12 EEG No. ID: [id number] Age: 72 y.o.        Sex: female Referring Physician: [place] Report Date:  03/15/2018        Interpreting Physician: Alexis Goodell  History: KA BENCH is an 72 y.o. female with syncope  Medications:  B12, Aricept, Insulin, Synthroid, Namenda, Zyprexa, Solumedrol, Pravachol, Heparin   Conditions of Recording:  This is a 21 channel routine scalp EEG performed with bipolar and monopolar montages arranged in accordance to the international 10/20 system of electrode placement. One channel was dedicated to EKG recording.  The patient is in the awake, drowsy and asleep states.  Description:  The patient must be alerted for wakefulness to be evaluated and therefore she is only awake for a short period of time during this recording.  During this time the waking background activity consists of a low voltage, symmetrical, fairly well organized, 9-10 Hz alpha activity, seen from the parieto-occipital and posterior temporal regions.  Low voltage fast activity, poorly organized, is seen anteriorly and is at times superimposed on more posterior regions.  A mixture of theta and alpha rhythms are seen from the central and temporal regions. The patient drowses with slowing to irregular, low voltage theta and beta activity.   The patient goes in to a light sleep with symmetrical sleep spindles, vertex central sharp transients and irregular slow activity.  No epileptiform activity is noted.   Hyperventilation and intermittent photic stimulation were not performed.  IMPRESSION: Normal electroencephalogram, awake and asleep. There are no focal lateralizing or epileptiform features.   Alexis Goodell, MD Neurology [phone] 03/15/2018, 9:44 AM

## 2018-03-15 NOTE — Care Management (Signed)
Patient Information   Patient Name Paula Andrews, Paula Andrews ([id number]) Sex Female DOB 17-Mar-1946  Room Bed  IC12 IC12-01  Patient Demographics   Address [place] [address] Phone [phone] (Home)  Patient Ethnicity & Race   Ethnic Group Patient Race  Not Hispanic or Latino White or Caucasian  Emergency Contact(s)   Name Relation Home Work Mobile  [place] Spouse [phone]  [phone]  Theresa Duty   [phone]  Documents on File    Status Date Received Description  Documents for the Patient  [place] Received 06/14/10   [place] E-Signature HIPAA Notice of Privacy Received 10/29/11   [place] E-Signature HIPAA Notice of Privacy Spanish Not Received    Driver's License Not Received    Advance Directives/Living Will/HCPOA/POA Not Received    Driver's License Not Received    EMR Patient Summary Not Received    EMR Problem Summary Not Received    Insurance Card Received 01/29/12   Insurance Card Received 07/27/10 not active  [place] HIPAA NOTICE OF PRIVACY - Scanned Not Received    Financial Application Not Received    Insurance Card Not Received  old ssn card  Insurance Card Not Received  old ssn card  AMB Intake Forms/Questionnaires Not Received    AMB Intake Forms/Questionnaires Not Received    Other Photo ID Not Received    HIM ROI Authorization  08/02/15 Patient Request  Insurance Card   new ssn card 2/13  Insurance Card   bcbs supplement secondary coverage card  AMB Intake Forms/Questionnaires Received 06/06/17 03/19 [facility] Laveda Norman MD, ZACK  AMB Provider Completed Forms Received 06/01/17 [facility] [place]  AMB Correspondence Received 06/12/17 LETTER/CONFIRMATION OF ORDER RELIANT [place] Card Received 08/06/17 Medicare - 2019 active  Insurance Card Received 08/06/17 BCBS -2019 active  Patient Photo   Photo of Patient  Documents for the Encounter  AOB (Assignment of  Insurance Benefits) Not Received    E-signature AOB Signed 03/13/18   MEDICARE RIGHTS Not Received    E-signature Medicare Rights Signed 03/13/18   Cardiac Monitoring Strip Received 03/13/18   Cardiac Monitoring Strip Shift Summary Received 03/13/18   ED Patient Billing Extract   ED PB Billing Extract  EKG Received 03/14/18   Admission Information   Current Information   Attending Provider Admitting Provider Admission Type Admission Status  Barton Dubois, MD Shela Leff, MD Emergency Admission (Confirmed)       Admission Date/Time Discharge Date Hospital Service Auth/Cert Status  56/31/49 02:29 PM  [place] Unit Room/Bed   [facility] AP-ICCUP NURSING IC12/IC12-01        Admission   Complaint  Hypotension  Hospital Account   Name Acct ID Class Status Primary Coverage  Faylene, [place] [id number] Inpatient Open MEDICARE - MEDICARE PART A AND B      Guarantor Account (for [place] [id number])   Name Relation to Pt Service Area Active? Acct Type  [place] Area Self CHSA Yes Personal/Family  Address Phone    [address] [place], [place] [address] [phone])        Coverage Information (for Hospital Account [id number])   1. [place] PART A AND B   F/O Payor/Plan Precert #  MEDICARE/MEDICARE PART A AND B   Subscriber Subscriber #  Nashea, [place] [id number]  Address Phone  [address] [place], [place] [address]   2. BLUE CROSS  BLUE SHIELD/BCBS SUPPLEMENT   F/O Payor/Plan Precert #  [place]UE [place]OSS BLUE SHIELD/BCBS SUPPLEMENT   Subscriber Subscriber #  Analyse, [place] [id number]  Address Phone  [address] [place], [place] [address] [phone]       Care Everywhere ID:  [id number]

## 2018-03-15 NOTE — Plan of Care (Signed)
  Problem: Acute Rehab PT Goals(only PT should resolve) Goal: Pt Will Go Supine/Side To Sit Outcome: Progressing Flowsheets (Taken 03/15/2018 1358) Pt will go Supine/Side to Sit: with modified independence Goal: Patient Will Transfer Sit To/From Stand Outcome: Progressing Flowsheets (Taken 03/15/2018 1358) Patient will transfer sit to/from stand: with modified independence Goal: Pt Will Transfer Bed To Chair/Chair To Bed Outcome: Progressing Flowsheets (Taken 03/15/2018 1358) Pt will Transfer Bed to Chair/Chair to Bed: with modified independence Goal: Pt Will Ambulate Outcome: Progressing Flowsheets (Taken 03/15/2018 1358) Pt will Ambulate: > 125 feet; with supervision   1:59 PM, 03/15/18 Lonell Grandchild, MPT Physical Therapist with [facility] 336 [phone] office [phone] mobile phone

## 2018-03-15 NOTE — Progress Notes (Signed)
PROGRESS NOTE    Paula Andrews  JJK:[id number] DOB: 05-26-1945 DOA: 03/13/2018 PCP: Celene Squibb, MD     Brief Narrative:  72 year old female with medical history significant for dementia, COPD (supposedly in need of chronic oxygen supplementation at home 2-3 L; but no compliance), hypertension, type 2 diabetes, hypothyroidism and obstructive sleep apnea (not compliant with CPAP); who presented to the emergency department secondary to shortness of breath, soft blood pressure, tachypnea and oxygen desaturation.  Per family patient also experienced an episode of syncope in the morning of the day of admission.    Work-up has demonstrated acute pulmonary embolism without right heart strain, COPD exacerbation and hypercapnia.  Assessment & Plan: 1-Acute respiratory failure with hypoxia and hypercapnia (HCC) and new PE -Move to med-surg -Continue BiPAP QHS now -Continue treatment for COPD exacerbation using steroids and nebulizer treatment. -transition anticoagulation to PO -home tomorrow most likely.   2-HTN (hypertension) -Stable overall -Continue current antihypertensive regimen -Follow vital signs.  3-Diabetes mellitus type II, controlled ([place]) -A1c 5.9 -Continue sliding scale insulin and use low-dose Lantus -Anticipate CBGs to be elevated while using steroids. -Follow blood glucose trend and adjust hypoglycemic regimen as needed.  4-HLD (hyperlipidemia) -Continue statins  5-Acute encephalopathy: patient with underlying dementia  -In the setting of respiratory failure and hypercapnia -Continue treatment with BiPAP at bedtime -repeat ABg improved and patient mentation back to baseline  -continue Minimizing the use of sedative medications.  6-Dementia with behavioral disturbances:(HCC) -Continue the use of Namenda and Zyprexa -continue supportive care.  7-B12 deficiency -continue B12 repletion therapy.  8-Hypothyroidism -Continue Synthroid.  9-GERD -continue  PPI  DVT prophylaxis: Heparin drip Code Status: Full code Family Communication: Daughter and husband at bedside Disposition Plan: Remains inpatient; transfer to med-surg. Removed foley; continue BIPAP QHS and transition to oral anticoagulation. Will most likely discharge in am.   Consultants:   None  Procedures:   See below for x-ray reports.  Antimicrobials:  Anti-infectives (From admission, onward)   None     Subjective: Afebrile, no CP, no palpitations. Patient with good O2 sat on 3L [place]. Tolerated BIPAP overnight.   Objective: Vitals:   03/15/18 1100 03/15/18 1132 03/15/18 1200 03/15/18 1300  BP: 129/70 (!) 134/56 (!) 144/58 (!) 143/56  Pulse: 73 66 73 71  Resp: 19 20 12 14   Temp:  97.6 F (36.4 C)    TempSrc:  Oral    SpO2: 99% 99% 97% 97%  Weight:      Height:        Intake/Output Summary (Last 24 hours) at 03/15/2018 1403 Last data filed at 03/15/2018 3716 Gross per 24 hour  Intake 107.31 ml  Output 1200 ml  Net -1092.69 ml   Filed Weights   03/13/18 1441 03/13/18 2019 03/15/18 0500  Weight: 63.5 kg 69.2 kg 73.4 kg    Examination: General exam: Alert, awake, oriented x 2; and according to family at bedside with mentation back to baseline. Patient used BIPAP at night time as instructed and it worked as a Nutritional therapist. No CP, no palpitations. Back to her 3L [place] supplementation.  Respiratory system: improved air movement, no crackles, still with mild exp wheezing. Cardiovascular system:RRR. No murmurs, rubs or gallops; no JVD. Gastrointestinal system: Abdomen is nondistended, soft and nontender. No organomegaly or masses felt. Normal bowel sounds heard. Central nervous system: Alert and oriented. No focal neurological deficits. Extremities: No C/C/E, +pedal pulses Skin: No rashes, lesions or ulcers Psychiatry: Judgement and insight appear normal for her. Mood &  affect appropriate.    Data Reviewed: I have personally reviewed following labs and imaging  studies  CBC: Recent Labs  Lab 03/13/18 1455 03/15/18 0405  WBC 8.1 15.5*  NEUTROABS 5.8  --   HGB 13.5 12.1  HCT 45.3 40.3  MCV 101.1* 99.0  PLT 206 269   Basic Metabolic Panel: Recent Labs  Lab 03/13/18 1455 03/15/18 0405  NA 141 141  K 4.0 4.4  CL 101 105  CO2 30 29  GLUCOSE 141* 179*  BUN 15 19  CREATININE 0.77 0.81  CALCIUM 9.0 9.0   GFR: Estimated Creatinine Clearance: 62.6 mL/min (by C-G formula based on SCr of 0.81 mg/dL).   Liver Function Tests: Recent Labs  Lab 03/13/18 1455  AST 14*  ALT 11  ALKPHOS 68  BILITOT 0.8  PROT 6.9  ALBUMIN 4.2   Coagulation Profile: Recent Labs  Lab 03/14/18 0147  INR 0.95   Cardiac Enzymes: Recent Labs  Lab 03/13/18 1455  TROPONINI <0.03   CBG: Recent Labs  Lab 03/14/18 1625 03/14/18 2057 03/15/18 0612 03/15/18 0757 03/15/18 1133  GLUCAP 158* 235* 162* 174* 263*   Thyroid Function Tests: Recent Labs    03/13/18 1455  TSH 0.845   Anemia Panel: Recent Labs    03/13/18 1455  VITAMINB12 167*   Urine analysis:    Component Value Date/Time   COLORURINE YELLOW 03/13/2018 [place] 03/13/2018 1444   LABSPEC 1.025 03/13/2018 1444   PHURINE 5.5 03/13/2018 1444   GLUCOSEU NEGATIVE 03/13/2018 1444   HGBUR NEGATIVE 03/13/2018 1444   BILIRUBINUR NEGATIVE 03/13/2018 1444   KETONESUR NEGATIVE 03/13/2018 1444   PROTEINUR NEGATIVE 03/13/2018 1444   NITRITE NEGATIVE 03/13/2018 1444   LEUKOCYTESUR NEGATIVE 03/13/2018 1444    Radiology Studies: Ct Head Wo Contrast  Result Date: 03/13/2018 CLINICAL DATA:  72 year old female with history of syncopal event this morning. Incontinent of bowel and bladder. EXAM: CT HEAD WITHOUT CONTRAST TECHNIQUE: Contiguous axial images were obtained from the base of the skull through the vertex without intravenous contrast. COMPARISON:  Head CT 01/20/2018. FINDINGS: Brain: Mild cerebral atrophy. Patchy and confluent areas of decreased attenuation are noted  throughout the deep and periventricular white matter of the cerebral hemispheres bilaterally, compatible with chronic microvascular ischemic disease. More focal area of hypoattenuation in the white matter of the left occipital lobe, similar to the prior examination, compatible with encephalomalacia/gliosis from remote infarction. No evidence of acute infarction, hemorrhage, hydrocephalus, extra-axial collection or mass lesion/mass effect. Vascular: No hyperdense vessel or unexpected calcification. Skull: Normal. Negative for fracture or focal lesion. Sinuses/Orbits: No acute finding. Other: None. IMPRESSION: 1. No acute intracranial abnormalities. 2. 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 Electronically Signed   By: Vinnie Langton M.D.   On: 03/13/2018 15:54   Ct Angio Chest Pe W Or Wo Contrast  Result Date: 03/14/2018 CLINICAL DATA:  Hypoxia. Found on the floor. EXAM: CT ANGIOGRAPHY CHEST WITH CONTRAST TECHNIQUE: Multidetector CT imaging of the chest was performed using the standard protocol during bolus administration of intravenous contrast. Multiplanar CT image reconstructions and MIPs were obtained to evaluate the vascular anatomy. CONTRAST:  184mL ISOVUE-370 IOPAMIDOL (ISOVUE-370) INJECTION 76% COMPARISON:  Portable chest dated 03/13/2018. Chest CTA dated 05/29/2017 and 07/15/2011. FINDINGS: Cardiovascular: Small number of small and small to moderate-sized filling defects in right lower lobe and right upper lobe pulmonary arteries. Atheromatous calcifications, including the coronary arteries and aorta.  Mediastinum/Nodes: Mild diffuse esophageal wall thickening. No enlarged lymph nodes. Unremarkable thyroid gland. Lungs/Pleura: Mild diffuse peribronchial thickening. Mild diffuse bilateral bullous changes. Minimal bibasilar atelectasis. No pleural fluid. Upper Abdomen: No significant change in 3 small oval  areas of low density in the liver. These have not changed significantly since 07/15/2011. Stable small calcified granuloma in the spleen. Musculoskeletal: Thoracic and lower cervical spine degenerative changes. Review of the MIP images confirms the above findings. IMPRESSION: 1. Small number of small and small to moderate-sized right lower lobe and right upper lobe pulmonary emboli. There is not enough clot burden to cause right heart strain. 2. Mild changes of COPD and chronic bronchitis. 3. Stable small liver cysts. 4. Mild diffuse esophageal wall thickening, most likely due to reflux esophagitis. Critical Value/emergent results were called by telephone at the time of interpretation on 03/14/2018 at 12:51 am to Dr. Elane Fritz, who verbally acknowledged these results. Aortic Atherosclerosis (ICD10-I70.0) and Emphysema (ICD10-J43.9). Electronically Signed   By: Claudie Revering M.D.   On: 03/14/2018 00:54   Dg Chest Port 1 View  Result Date: 03/13/2018 CLINICAL DATA:  Syncopal episode this morning, incontinence and hypoxia. EXAM: PORTABLE CHEST 1 VIEW COMPARISON:  Chest radiograph January 03, 2018 FINDINGS: Cardiac silhouette is upper limits of normal size. Calcified aortic arch. Mild chronic bronchitic changes. No pleural effusion or focal consolidation. No pneumothorax. Soft tissue planes and included osseous structures are nonacute. IMPRESSION: Borderline cardiomegaly. Mild chronic bronchitic changes without focal consolidation. Electronically Signed   By: Elon Alas M.D.   On: 03/13/2018 15:10    Scheduled Meds: . budesonide (PULMICORT) nebulizer solution  0.5 mg Nebulization BID  . cyanocobalamin  1,000 mcg Intramuscular Daily  . donepezil  10 mg Oral QHS  . insulin aspart  0-9 Units Subcutaneous TID WC  . insulin detemir  5 Units Subcutaneous QHS  . ipratropium-albuterol  3 mL Nebulization QID  . levothyroxine  75 mcg Oral Q0600  . memantine  10 mg Oral BID  . methylPREDNISolone (SOLU-MEDROL)  injection  60 mg Intravenous Q8H  . OLANZapine  2.5 mg Oral QHS  . pantoprazole  40 mg Oral Daily  . pravastatin  20 mg Oral q1800  . Rivaroxaban  15 mg Oral BID WC  . [START ON 04/05/2018] rivaroxaban  20 mg Oral Q supper   Continuous Infusions:   LOS: 1 day    Time spent: 30 minutes    Barton Dubois, MD [facility] Pager [phone]  If 7PM-7AM, please contact night-coverage [email or web address] Password [email or web address] 03/15/2018, 2:03 PM

## 2018-03-16 LAB — GLUCOSE, CAPILLARY
GLUCOSE-CAPILLARY: 177 mg/dL — AB (ref 70–99)
Glucose-Capillary: 173 mg/dL — ABNORMAL HIGH (ref 70–99)
Glucose-Capillary: 249 mg/dL — ABNORMAL HIGH (ref 70–99)

## 2018-03-16 LAB — BASIC METABOLIC PANEL
Anion gap: 8 (ref 5–15)
BUN: 26 mg/dL — ABNORMAL HIGH (ref 8–23)
CALCIUM: 9.1 mg/dL (ref 8.9–10.3)
CO2: 30 mmol/L (ref 22–32)
Chloride: 103 mmol/L (ref 98–111)
Creatinine, Ser: 0.77 mg/dL (ref 0.44–1.00)
GFR calc Af Amer: >60 mL/min (ref >60)
GFR calc non Af Amer: >60 mL/min (ref >60)
Glucose, Bld: 187 mg/dL — ABNORMAL HIGH (ref 70–99)
Potassium: 4.2 mmol/L (ref 3.5–5.1)
Sodium: 141 mmol/L (ref 135–145)

## 2018-03-16 LAB — CBC
HCT: 41.3 % (ref 36.0–46.0)
Hemoglobin: 13.1 g/dL (ref 12.0–15.0)
MCH: 30.6 pg (ref 26.0–34.0)
MCHC: 31.7 g/dL (ref 30.0–36.0)
MCV: 96.5 fL (ref 80.0–100.0)
Platelets: 233 10*3/uL (ref 150–400)
RBC: 4.28 MIL/uL (ref 3.87–5.11)
RDW: 12.9 % (ref 11.5–15.5)
WBC: 16.6 10*3/uL — AB (ref 4.0–10.5)
nRBC: 0 % (ref 0.0–0.2)

## 2018-03-16 MED ORDER — IPRATROPIUM-ALBUTEROL 0.5-2.5 (3) MG/3ML IN SOLN
3.0000 mL | Freq: Three times a day (TID) | RESPIRATORY_TRACT | Status: DC
  Administered 2018-03-16 – 2018-03-17 (×4): 3 mL via RESPIRATORY_TRACT
  Filled 2018-03-16 (×4): qty 3

## 2018-03-16 NOTE — Discharge Instructions (Signed)
Information on my medicine - XARELTO (rivaroxaban)  This medication education was reviewed with me or my healthcare representative as part of my discharge preparation.   WHY WAS XARELTO PRESCRIBED FOR YOU? Xarelto was prescribed to treat blood clots that may have been found in the veins of your legs (deep vein thrombosis) or in your lungs (pulmonary embolism) and to reduce the risk of them occurring again.  What do you need to know about Xarelto? The starting dose is one 15 mg tablet taken TWICE daily with food for the FIRST 21 DAYS then on (enter date)  04/05/18  the dose is changed to one 20 mg tablet taken ONCE A DAY with your evening meal.  DO NOT stop taking Xarelto without talking to the health care provider who prescribed the medication.  Refill your prescription for 20 mg tablets before you run out.  After discharge, you should have regular check-up appointments with your healthcare provider that is prescribing your Xarelto.  In the future your dose may need to be changed if your kidney function changes by a significant amount.  What do you do if you miss a dose? If you are taking Xarelto TWICE DAILY and you miss a dose, take it as soon as you remember. You may take two 15 mg tablets (total 30 mg) at the same time then resume your regularly scheduled 15 mg twice daily the next day.  If you are taking Xarelto ONCE DAILY and you miss a dose, take it as soon as you remember on the same day then continue your regularly scheduled once daily regimen the next day. Do not take two doses of Xarelto at the same time.   Important Safety Information Xarelto is a blood thinner medicine that can cause bleeding. You should call your healthcare provider right away if you experience any of the following: ? Bleeding from an injury or your nose that does not stop. ? Unusual colored urine (red or dark brown) or unusual colored stools (red or black). ? Unusual bruising for unknown reasons. ? A  serious fall or if you hit your head (even if there is no bleeding).  Some medicines may interact with Xarelto and might increase your risk of bleeding while on Xarelto. To help avoid this, consult your healthcare provider or pharmacist prior to using any new prescription or non-prescription medications, including herbals, vitamins, non-steroidal anti-inflammatory drugs (NSAIDs) and supplements.  This website has more information on Xarelto: [email or web address]

## 2018-03-16 NOTE — Progress Notes (Signed)
PROGRESS NOTE    Paula Andrews  TFT:[id number] DOB: 04/20/1945 DOA: 03/13/2018 PCP: Celene Squibb, MD    Brief Narrative:  72 year old female who presented with syncope episode.  She does have significant past medical history of dementia, COPD, type 2 diabetes mellitus, hypertension and hypothyroidism.  Patient experienced a syncope episode at home, she was found down by her husband, apparently she did not lose continence bowel and bladder, she was found to be hypoxic, oxygen saturation 66%.  In the emergency department heart rate was 50s to 60s, oxygen saturation 94% room air, blood pressure 106/41, afebrile.  Heart S1-S2 present, rhythmic, no gallops rubs or murmurs, lungs with mild bibasilar Rales, abdomen soft nontender, no lower extremity edema.  Neurologically patient was nonfocal.  Chest radiograph with vascular congestion and cardiomegaly, enlarged pulmonary artery.  Chest CT with small number of small to moderate size right lobe and right upper lobe pulmonary embolism.  EKG was normal sinus rhythm, normal axis, normal intervals.  Head CT negative for acute changes.  Patient was admitted to the hospital with the working diagnosis of acute hypoxic and hypercapnic respiratory failure due to pulmonary embolism, complicated by COPD exacerbation.  Assessment & Plan:   Principal Problem:   Acute respiratory failure with hypoxia (HCC) Active Problems:   HTN (hypertension)   Diabetes mellitus type II, controlled ([place])   HLD (hyperlipidemia)   Acute encephalopathy   Fall   Sinus bradycardia   Dementia (HCC)   Hypothyroidism   Mood disorder ([place])   1. COPD exacerbation complicated with acute hypoxic and hypercapnic respiratory failure. This am with no significant wheezing, but continue to be symptomatic, will continue systemic steroids and bronchodilator therapy. At home patient has Cpap. Patient's family hoping to place patient on SNF for short term rehab, but physical therapy  recommending home health. Continue budesonide.    2. Acute metabolic encephalopathy in the setting of dementia. Patient is more awake and alert, continue supportive medical therapy. Out of bed as tolerated. Continue namenda and zyprexa.   3. T2DM. Continue glucose control and monitoring with insulin sliding scale. Basal insulin with levimir 5 units.   4. Hypothyroidism. Continue levothyroxine.   5. Pulmonary embolism. Will continue anticoagulation with rivaroxaban.    DVT prophylaxis: enoxaparin   Code Status: DNR Family Communication: I spoke with patient's family at the bedside and all questions were addressed. Patient has decided to change code status to DNR and her family is agreeing.   Disposition Plan/ discharge barriers: pending clinical improvement.   Body mass index is 27.78 kg/m. Malnutrition Type:      Malnutrition Characteristics:      Nutrition Interventions:     RN Pressure Injury Documentation:     Consultants:     Procedures:     Antimicrobials:       Subjective: Patient has been transferred from the stepdown unit to the medical ward, this am with improving dyspnea, not back to baseline, no nausea or vomiting, continue to be very weak and deconditioned.   Objective: Vitals:   03/15/18 2144 03/15/18 2309 03/16/18 0537 03/16/18 0837  BP: (!) 153/65  (!) 165/73   Pulse: 64  67   Resp: 16  16   Temp: 98.4 F (36.9 C)  98 F (36.7 C)   TempSrc: Oral  Oral   SpO2: 94% 95% 92% 94%  Weight:      Height:        Intake/Output Summary (Last 24 hours) at 03/16/2018 1139 Last  data filed at 03/16/2018 0500 Gross per 24 hour  Intake 270.63 ml  Output 1100 ml  Net -829.37 ml   Filed Weights   03/13/18 1441 03/13/18 2019 03/15/18 0500  Weight: 63.5 kg 69.2 kg 73.4 kg    Examination:   General: deconditioned and ill looking appearing  Neurology: Awake and alert, non focal  E ENT: mild pallor, no icterus, oral mucosa  moist Cardiovascular: No JVD. S1-S2 present, rhythmic, no gallops, rubs, or murmurs. No lower extremity edema. Pulmonary: positive breath sounds bilaterally, decreased air movement, no wheezing, rhonchi or rales. Gastrointestinal. Abdomen with, no organomegaly, non tender, no rebound or guarding Skin. No rashes Musculoskeletal: no joint deformities     Data Reviewed: I have personally reviewed following labs and imaging studies  CBC: Recent Labs  Lab 03/13/18 1455 03/15/18 0405 03/16/18 0721  WBC 8.1 15.5* 16.6*  NEUTROABS 5.8  --   --   HGB 13.5 12.1 13.1  HCT 45.3 40.3 41.3  MCV 101.1* 99.0 96.5  PLT 206 213 170   Basic Metabolic Panel: Recent Labs  Lab 03/13/18 1455 03/15/18 0405 03/16/18 0721  NA 141 141 141  K 4.0 4.4 4.2  CL 101 105 103  CO2 30 29 30   GLUCOSE 141* 179* 187*  BUN 15 19 26*  CREATININE 0.77 0.81 0.77  CALCIUM 9.0 9.0 9.1   GFR: Estimated Creatinine Clearance: 63.3 mL/min (by C-G formula based on SCr of 0.77 mg/dL). Liver Function Tests: Recent Labs  Lab 03/13/18 1455  AST 14*  ALT 11  ALKPHOS 68  BILITOT 0.8  PROT 6.9  ALBUMIN 4.2   No results for input(s): LIPASE, AMYLASE in the last 168 hours. No results for input(s): AMMONIA in the last 168 hours. Coagulation Profile: Recent Labs  Lab 03/14/18 0147  INR 0.95   Cardiac Enzymes: Recent Labs  Lab 03/13/18 1455  TROPONINI <0.03   BNP (last 3 results) No results for input(s): PROBNP in the last 8760 hours. HbA1C: Recent Labs    03/13/18 1455  HGBA1C 5.9*   CBG: Recent Labs  Lab 03/15/18 0757 03/15/18 1133 03/15/18 1610 03/15/18 2049 03/16/18 0741  GLUCAP 174* 263* 312* 269* 173*   Lipid Profile: No results for input(s): CHOL, HDL, LDLCALC, TRIG, CHOLHDL, LDLDIRECT in the last 72 hours. Thyroid Function Tests: Recent Labs    03/13/18 1455  TSH 0.845   Anemia Panel: Recent Labs    03/13/18 1455  VITAMINB12 167*      Radiology Studies: I have  reviewed all of the imaging during this hospital visit personally     Scheduled Meds: . budesonide (PULMICORT) nebulizer solution  0.5 mg Nebulization BID  . cyanocobalamin  1,000 mcg Intramuscular Daily  . donepezil  10 mg Oral QHS  . insulin aspart  0-9 Units Subcutaneous TID WC  . insulin detemir  5 Units Subcutaneous QHS  . ipratropium-albuterol  3 mL Nebulization TID  . levothyroxine  75 mcg Oral Q0600  . memantine  10 mg Oral BID  . methylPREDNISolone (SOLU-MEDROL) injection  60 mg Intravenous Q8H  . OLANZapine  2.5 mg Oral QHS  . pantoprazole  40 mg Oral Daily  . pravastatin  20 mg Oral q1800  . Rivaroxaban  15 mg Oral BID WC  . [START ON 04/05/2018] rivaroxaban  20 mg Oral Q supper   Continuous Infusions:   LOS: 2 days        Kilyn Maragh Gerome Apley, MD [facility] Pager [phone]

## 2018-03-16 NOTE — Progress Notes (Signed)
I attempted to place the DNR bracelet on patient. I asked her if she understood what the bracelet and DNR meant and if she agreed with it. She said that she did not fully agree to being a DNR, so I held off putting the bracelet on her.

## 2018-03-17 DIAGNOSIS — I2699 Other pulmonary embolism without acute cor pulmonale: Secondary | ICD-10-CM

## 2018-03-17 LAB — BLOOD GAS, ARTERIAL
Acid-Base Excess: 7.4 mmol/L — ABNORMAL HIGH (ref 0.0–2.0)
BICARBONATE: 30.4 mmol/L — AB (ref 20.0–28.0)
Drawn by: 41977
O2 Content: 2 L/min
O2 Saturation: 96.3 %
PO2 ART: 80.3 mmHg — AB (ref 83.0–108.0)
pCO2 arterial: 51 mmHg — ABNORMAL HIGH (ref 32.0–48.0)
pH, Arterial: 7.415 (ref 7.350–7.450)

## 2018-03-17 LAB — GLUCOSE, CAPILLARY: Glucose-Capillary: 173 mg/dL — ABNORMAL HIGH (ref 70–99)

## 2018-03-17 MED ORDER — PREDNISONE 20 MG PO TABS: ORAL_TABLET | ORAL | 0 refills | Status: DC

## 2018-03-17 MED ORDER — PANTOPRAZOLE SODIUM 40 MG PO TBEC: 40.0000 mg | DELAYED_RELEASE_TABLET | Freq: Every day | ORAL | 0 refills | Status: DC

## 2018-03-17 MED ORDER — RIVAROXABAN 15 MG PO TABS: 15.0000 mg | ORAL_TABLET | Freq: Two times a day (BID) | ORAL | 0 refills | Status: DC

## 2018-03-17 MED ORDER — RIVAROXABAN 20 MG PO TABS: 20.0000 mg | ORAL_TABLET | Freq: Every day | ORAL | 3 refills | Status: DC

## 2018-03-17 MED ORDER — CYANOCOBALAMIN 1000 MCG/ML IJ SOLN: INTRAMUSCULAR | 0 refills | Status: DC

## 2018-03-17 MED ORDER — FLUTICASONE-SALMETEROL 250-50 MCG/DOSE IN AEPB: 1.0000 | INHALATION_SPRAY | Freq: Two times a day (BID) | RESPIRATORY_TRACT | 2 refills | Status: DC

## 2018-03-17 NOTE — Progress Notes (Signed)
IV removed and discharge instructions reviewed with daughter.  Scripts given

## 2018-03-17 NOTE — Discharge Summary (Signed)
Physician Discharge Summary  Paula Andrews DJT:[id number] DOB: September 18, 1945 DOA: 03/13/2018  PCP: Celene Squibb, MD  Admit date: 03/13/2018 Discharge date: 03/17/2018  Time spent: 35 minutes  Recommendations for Outpatient Follow-up:  1. Make sure patient has follow-up instructions to be compliant with her CPAP machine. 2. -Repeat basic metabolic panel to follow electrolytes and renal function 3. Make sure to follow-up B12 level in the next 19-month to further assess need of acute repletion. 4. Repeat CBC to follow hemoglobin trend platelets count.  Patient has been started on anticoagulation 5. -Reassess need for further anticoagulation past 17-month for new development of pulmonary embolism.   Discharge Diagnoses:  Principal Problem:   Acute respiratory failure with hypoxia (HCC) Active Problems:   HTN (hypertension)   Diabetes mellitus type II, controlled ([place])   HLD (hyperlipidemia)   COPD with acute exacerbation (HCC)   Acute encephalopathy   Fall   Sinus bradycardia   Dementia ([place])   Hypothyroidism   Mood disorder ([place])   Pulmonary embolus ([place])   Discharge Condition: Stable and improved.  Patient has been discharged home with instruction to follow-up with PCP in 10 days.  Diet recommendation: Heart healthy diet and modify carbohydrate diet.  Filed Weights   03/13/18 1441 03/13/18 2019 03/15/18 0500  Weight: 63.5 kg 69.2 kg 73.4 kg    History of present illness:  Patient be written by Dr. Marlowe Sax on 03/13/18 72 y.o. female with medical history significant of dementia, COPD, type 2 diabetes, hypertension, hypothyroidism presenting from her PCPs office for evaluation of a syncopal episode this morning.  Per triage note, patient's O2 saturation was 66% at home.  At PCPs office, blood pressure 88/40, heart rate 42, and oxygen saturation 93% on room air at rest but 85% with exertion.  Patient was confused and no history could be obtained from her.  She is not sure why she  is here.  Denies having any chest pain, shortness of breath, or abdominal pain.  She has no complaints.  Per husband at bedside, he noticed that the patient was appearing pale this morning and weak.  He found her on the floor and patient had lost control of her bowel and bladder.  He is not sure if she had lost consciousness or how exactly she fell.  He did not notice any shaking of her body.  Does state that patient has been having decreased p.o. intake and has been very sleepy recently.  She has COPD for which she does not use any inhalers.  She is supposed to use CPAP but has not been using it.  States patient normally does not use home oxygen but has been requiring it at night for the past 2 weeks.  He noticed that her oxygen saturation was 66% at rest this morning.  States she takes Synthroid at home and there was no recent change in the dose of the medication.  She has not taken trazodone for several weeks.  ED Course: Heart rate in the 50s to 60s (on beta blocker at home).  SPO2 84% on room air, currently on 2 L supplemental oxygen.  Blood pressure 106/41 on arrival.  Blood glucose 141.  Afebrile and no leukocytosis.  UA not suggestive of infection.  LFTs normal.  I-STAT troponin negative.  EKG showing sinus bradycardia (heart rate 52); not suggestive of ACS.  VBG showing pH 7.27, pCO2 73.  Chest x-ray showing borderline cardiomegaly, mild chronic bronchitic changes without focal consolidation.  Head CT showing no  acute intracranial abnormalities.  Hospital Course:  1-acute on chronic respiratory failure with hypoxia and hypercapnia -Condition improve and stabilize -At discharge patient ABG demonstrated a CO2 of 51 (most likely baseline as a chronic retainer) -Patient and family has been advised to be compliant with the use of her CPAP machine at home -No complaints of chest pain or shortness of breath at discharge.  2-acute pulmonary embolism -No signs of acute bleeding appreciated -Patient  tolerated the use of Xarelto -Plan is to treat for 3 months -Outpatient follow-up with PCP prior to discontinuation of anticoagulation.  3-hypertension -Is stable and well-controlled overall -Will resume home antihypertensive regimen. -Patient advised to follow heart healthy diet.  4-type 2 diabetes mellitus -Well-controlled -A1c 5.9 -Resume home hypoglycemic regimen -Advised to follow modified carbohydrate diet.  5-acute encephalopathy: In the setting of underlying dementia plus hypercapnia. -Improving/resolve after treatment with BiPAP -Family has been instructed to be compliant with the machine at bedtime -Mentation at baseline at discharge.  6-hyperlipidemia -Will continue the use of statins.  7-dementia with behavioral disturbances -Continue supportive care -Continue the use of Namenda and Zyprexa.  8-B12 deficiency -Patient has been discharged with prescription to continue B12 repletion as an outpatient -Outpatient follow-up with PCP to repeat B12 level.  9-hypothyroidism -Continue Synthroid -TSH within normal limits.  10-gastroesophageal flux disease -Continue PPI. Procedures:  See below for x-ray reports.  Consultations:  None  Discharge Exam: Vitals:   03/17/18 1317 03/17/18 1517  BP:    Pulse:    Resp:    Temp:    SpO2: 98% 98%    General: Afebrile, oriented x2; in no acute distress.  Back to her oxygen supplementation chronically uses at home.  Able to speak in full sentences and denying chest pain. Cardiovascular: S1 and S2, no rubs, no gallops, no murmurs. Respiratory: Improved air movement bilaterally; positive scattered rhonchi; no frank wheezing on expiration.  Normal respiratory effort. Abdomen: Soft, nontender, nondistended, positive bowel sounds Extremities: No edema, no cyanosis, no clubbing.  Discharge Instructions   Discharge Instructions    Diet - low sodium heart healthy   Complete by:  As directed    Discharge instructions    Complete by:  As directed    Take medications as prescribed Keep yourself well-hydrated Please be compliant with the use of CPAP nightly Arrange follow-up with PCP in 10 days Complete therapy for acute exacerbation of COPD as instructed Avoid the use of NSAIDs especially now that you be using anticoagulation therapy; to minimize the chances of bleeding.     Allergies as of 03/17/2018   No Known Allergies     Medication List    TAKE these medications   acetaminophen 325 MG tablet Commonly known as:  TYLENOL Take 650 mg by mouth 2 (two) times daily as needed for mild pain or moderate pain.   albuterol 108 (90 Base) MCG/ACT inhaler Commonly known as:  PROVENTIL HFA;VENTOLIN HFA Inhale 2 puffs into the lungs every 6 (six) hours as needed for wheezing or shortness of breath.   amLODipine 5 MG tablet Commonly known as:  NORVASC Take 1 tablet (5 mg total) by mouth daily.   cyanocobalamin 1000 MCG/ML injection Commonly known as:  (VITAMIN B-12) Inject daily for 5 more days; then once a week for a month; then monthly.   donepezil 10 MG tablet Commonly known as:  ARICEPT Take 10 mg by mouth at bedtime.   Fluticasone-Salmeterol 250-50 MCG/DOSE Aepb Commonly known as:  ADVAIR DISKUS Inhale 1 puff into  the lungs 2 (two) times daily.   levothyroxine 75 MCG tablet Commonly known as:  SYNTHROID, LEVOTHROID Take 75 mcg by mouth daily.   lisinopril 5 MG tablet Commonly known as:  PRINIVIL,ZESTRIL Take 5 mg by mouth daily.   memantine 10 MG tablet Commonly known as:  NAMENDA Take 10 mg by mouth 2 (two) times daily.   metFORMIN 500 MG tablet Commonly known as:  GLUCOPHAGE Take 500 mg by mouth daily as needed (for high blood sugars).   metoprolol tartrate 25 MG tablet Commonly known as:  LOPRESSOR Take 25 mg by mouth 2 (two) times daily.   OLANZapine 2.5 MG tablet Commonly known as:  ZYPREXA Take 1 tablet by mouth at bedtime.   ondansetron 4 MG tablet Commonly known as:   ZOFRAN Take 4 mg by mouth every 8 (eight) hours as needed for nausea or vomiting.   pantoprazole 40 MG tablet Commonly known as:  PROTONIX Take 1 tablet (40 mg total) by mouth daily. Start taking on:  March 18, 2018   pravastatin 20 MG tablet Commonly known as:  PRAVACHOL Take 20 mg by mouth every morning.   predniSONE 20 MG tablet Commonly known as:  DELTASONE Take 2 tablets by mouth daily for 5 days and stop prednisone.   Rivaroxaban 15 MG Tabs tablet Commonly known as:  XARELTO Take 1 tablet (15 mg total) by mouth 2 (two) times daily with a meal for 19 days.   rivaroxaban 20 MG Tabs tablet Commonly known as:  XARELTO Take 1 tablet (20 mg total) by mouth daily with supper. Start taking on:  April 06, 2018   traZODone 50 MG tablet Commonly known as:  DESYREL Take 50 mg by mouth at bedtime as needed for sleep.            Durable Medical Equipment  (From admission, onward)         Start     Ordered   03/15/18 1211  For home use only DME Walker rolling  Once    Comments:  Ht 5'4" Wt 74kg  Question Answer Comment  Patient needs a walker to treat with the following condition General weakness   Patient needs a walker to treat with the following condition COPD (chronic obstructive pulmonary disease) ([place])      03/15/18 1210         No Known Allergies Follow-up Information    Celene Squibb, MD Follow up in 10 day(s).   Specialty:  Internal Medicine Contact information: [place] [address] [phone]           The results of significant diagnostics from this hospitalization (including imaging, microbiology, ancillary and laboratory) are listed below for reference.    Significant Diagnostic Studies: Ct Head Wo Contrast  Result Date: 03/13/2018 CLINICAL DATA:  72 year old female with history of syncopal event this morning. Incontinent of bowel and bladder. EXAM: CT HEAD WITHOUT CONTRAST TECHNIQUE: Contiguous axial images were  obtained from the base of the skull through the vertex without intravenous contrast. COMPARISON:  Head CT 01/20/2018. FINDINGS: Brain: Mild cerebral atrophy. Patchy and confluent areas of decreased attenuation are noted throughout the deep and periventricular white matter of the cerebral hemispheres bilaterally, compatible with chronic microvascular ischemic disease. More focal area of hypoattenuation in the white matter of the left occipital lobe, similar to the prior examination, compatible with encephalomalacia/gliosis from remote infarction. No evidence of acute infarction, hemorrhage, hydrocephalus, extra-axial collection or mass lesion/mass effect. Vascular: No  hyperdense vessel or unexpected calcification. Skull: Normal. Negative for fracture or focal lesion. Sinuses/Orbits: No acute finding. Other: None. IMPRESSION: 1. No acute intracranial abnormalities. 2. 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 Electronically Signed   By: Vinnie Langton M.D.   On: 03/13/2018 15:54   Ct Angio Chest Pe W Or Wo Contrast  Result Date: 03/14/2018 CLINICAL DATA:  Hypoxia. Found on the floor. EXAM: CT ANGIOGRAPHY CHEST WITH CONTRAST TECHNIQUE: Multidetector CT imaging of the chest was performed using the standard protocol during bolus administration of intravenous contrast. Multiplanar CT image reconstructions and MIPs were obtained to evaluate the vascular anatomy. CONTRAST:  166mL ISOVUE-370 IOPAMIDOL (ISOVUE-370) INJECTION 76% COMPARISON:  Portable chest dated 03/13/2018. Chest CTA dated 05/29/2017 and 07/15/2011. FINDINGS: Cardiovascular: Small number of small and small to moderate-sized filling defects in right lower lobe and right upper lobe pulmonary arteries. Atheromatous calcifications, including the coronary arteries and aorta. Mediastinum/Nodes: Mild diffuse esophageal wall thickening. No enlarged lymph nodes.  Unremarkable thyroid gland. Lungs/Pleura: Mild diffuse peribronchial thickening. Mild diffuse bilateral bullous changes. Minimal bibasilar atelectasis. No pleural fluid. Upper Abdomen: No significant change in 3 small oval areas of low density in the liver. These have not changed significantly since 07/15/2011. Stable small calcified granuloma in the spleen. Musculoskeletal: Thoracic and lower cervical spine degenerative changes. Review of the MIP images confirms the above findings. IMPRESSION: 1. Small number of small and small to moderate-sized right lower lobe and right upper lobe pulmonary emboli. There is not enough clot burden to cause right heart strain. 2. Mild changes of COPD and chronic bronchitis. 3. Stable small liver cysts. 4. Mild diffuse esophageal wall thickening, most likely due to reflux esophagitis. Critical Value/emergent results were called by telephone at the time of interpretation on 03/14/2018 at 12:51 am to Dr. Elane Fritz, who verbally acknowledged these results. Aortic Atherosclerosis (ICD10-I70.0) and Emphysema (ICD10-J43.9). Electronically Signed   By: Claudie Revering M.D.   On: 03/14/2018 00:54   Dg Chest Port 1 View  Result Date: 03/13/2018 CLINICAL DATA:  Syncopal episode this morning, incontinence and hypoxia. EXAM: PORTABLE CHEST 1 VIEW COMPARISON:  Chest radiograph January 03, 2018 FINDINGS: Cardiac silhouette is upper limits of normal size. Calcified aortic arch. Mild chronic bronchitic changes. No pleural effusion or focal consolidation. No pneumothorax. Soft tissue planes and included osseous structures are nonacute. IMPRESSION: Borderline cardiomegaly. Mild chronic bronchitic changes without focal consolidation. Electronically Signed   By: Elon Alas M.D.   On: 03/13/2018 15:10    Microbiology: Recent Results (from the past 240 hour(s))  MRSA PCR Screening     Status: None   Collection Time: 03/14/18 12:33 PM  Result Value Ref Range Status   MRSA by PCR NEGATIVE  NEGATIVE Final    Comment:        The GeneXpert MRSA Assay (FDA approved for NASAL specimens only), is one component of a comprehensive MRSA colonization surveillance program. It is not intended to diagnose MRSA infection nor to guide or monitor treatment for MRSA infections. Performed at [facility], [address], [place], [place] [address]      Labs: Basic Metabolic Panel: Recent Labs  Lab 03/13/18 1455 03/15/18 0405 03/16/18 0721  NA 141 141 141  K 4.0 4.4 4.2  CL 101 105 103  CO2 30 29 30   GLUCOSE 141* 179* 187*  BUN 15 19 26*  CREATININE 0.77 0.81 0.77  CALCIUM 9.0 9.0 9.1  Liver Function Tests: Recent Labs  Lab 03/13/18 1455  AST 14*  ALT 11  ALKPHOS 68  BILITOT 0.8  PROT 6.9  ALBUMIN 4.2   CBC: Recent Labs  Lab 03/13/18 1455 03/15/18 0405 03/16/18 0721  WBC 8.1 15.5* 16.6*  NEUTROABS 5.8  --   --   HGB 13.5 12.1 13.1  HCT 45.3 40.3 41.3  MCV 101.1* 99.0 96.5  PLT 206 213 233   Cardiac Enzymes: Recent Labs  Lab 03/13/18 1455  TROPONINI <0.03   BNP: BNP (last 3 results) Recent Labs    05/31/17 1648  BNP 41.4   CBG: Recent Labs  Lab 03/15/18 2049 03/16/18 0741 03/16/18 1147 03/16/18 1632 03/17/18 0757  GLUCAP 269* 173* 249* 177* 173*    Signed:  Barton Dubois MD.  [facility] 03/17/2018, 4:20 PM

## 2018-03-18 ENCOUNTER — Other Ambulatory Visit: Payer: Self-pay

## 2018-03-18 DIAGNOSIS — I2699 Other pulmonary embolism without acute cor pulmonale: Secondary | ICD-10-CM | POA: Diagnosis not present

## 2018-03-18 DIAGNOSIS — F39 Unspecified mood [affective] disorder: Secondary | ICD-10-CM | POA: Diagnosis not present

## 2018-03-18 DIAGNOSIS — W19XXXD Unspecified fall, subsequent encounter: Secondary | ICD-10-CM | POA: Diagnosis not present

## 2018-03-18 DIAGNOSIS — F0281 Dementia in other diseases classified elsewhere with behavioral disturbance: Secondary | ICD-10-CM | POA: Diagnosis not present

## 2018-03-18 DIAGNOSIS — J9621 Acute and chronic respiratory failure with hypoxia: Secondary | ICD-10-CM | POA: Diagnosis not present

## 2018-03-18 DIAGNOSIS — J441 Chronic obstructive pulmonary disease with (acute) exacerbation: Secondary | ICD-10-CM | POA: Diagnosis not present

## 2018-03-18 DIAGNOSIS — K219 Gastro-esophageal reflux disease without esophagitis: Secondary | ICD-10-CM | POA: Diagnosis not present

## 2018-03-18 DIAGNOSIS — R001 Bradycardia, unspecified: Secondary | ICD-10-CM | POA: Diagnosis not present

## 2018-03-18 DIAGNOSIS — I10 Essential (primary) hypertension: Secondary | ICD-10-CM | POA: Diagnosis not present

## 2018-03-18 DIAGNOSIS — Z7984 Long term (current) use of oral hypoglycemic drugs: Secondary | ICD-10-CM | POA: Diagnosis not present

## 2018-03-18 DIAGNOSIS — Z7901 Long term (current) use of anticoagulants: Secondary | ICD-10-CM | POA: Diagnosis not present

## 2018-03-18 DIAGNOSIS — E039 Hypothyroidism, unspecified: Secondary | ICD-10-CM | POA: Diagnosis not present

## 2018-03-18 DIAGNOSIS — E785 Hyperlipidemia, unspecified: Secondary | ICD-10-CM | POA: Diagnosis not present

## 2018-03-18 DIAGNOSIS — E119 Type 2 diabetes mellitus without complications: Secondary | ICD-10-CM | POA: Diagnosis not present

## 2018-03-18 DIAGNOSIS — Z9181 History of falling: Secondary | ICD-10-CM | POA: Diagnosis not present

## 2018-03-18 DIAGNOSIS — E538 Deficiency of other specified B group vitamins: Secondary | ICD-10-CM | POA: Diagnosis not present

## 2018-03-18 DIAGNOSIS — Z9981 Dependence on supplemental oxygen: Secondary | ICD-10-CM | POA: Diagnosis not present

## 2018-03-18 DIAGNOSIS — Z87891 Personal history of nicotine dependence: Secondary | ICD-10-CM | POA: Diagnosis not present

## 2018-03-18 DIAGNOSIS — J9622 Acute and chronic respiratory failure with hypercapnia: Secondary | ICD-10-CM | POA: Diagnosis not present

## 2018-03-18 LAB — GLUCOSE, CAPILLARY: Glucose-Capillary: 289 mg/dL — ABNORMAL HIGH (ref 70–99)

## 2018-03-21 DIAGNOSIS — I10 Essential (primary) hypertension: Secondary | ICD-10-CM | POA: Diagnosis not present

## 2018-03-21 DIAGNOSIS — I2699 Other pulmonary embolism without acute cor pulmonale: Secondary | ICD-10-CM | POA: Diagnosis not present

## 2018-03-21 DIAGNOSIS — J9621 Acute and chronic respiratory failure with hypoxia: Secondary | ICD-10-CM | POA: Diagnosis not present

## 2018-03-21 DIAGNOSIS — J9622 Acute and chronic respiratory failure with hypercapnia: Secondary | ICD-10-CM | POA: Diagnosis not present

## 2018-03-21 DIAGNOSIS — R001 Bradycardia, unspecified: Secondary | ICD-10-CM | POA: Diagnosis not present

## 2018-03-21 DIAGNOSIS — J441 Chronic obstructive pulmonary disease with (acute) exacerbation: Secondary | ICD-10-CM | POA: Diagnosis not present

## 2018-03-22 ENCOUNTER — Other Ambulatory Visit: Payer: Self-pay

## 2018-03-22 ENCOUNTER — Emergency Department (HOSPITAL_COMMUNITY)
Admission: EM | Admit: 2018-03-22 | Discharge: 2018-03-22 | Disposition: A | Discharge status: Home / Self Care | Payer: Medicare Other | Source: Home / Self Care | Attending: Emergency Medicine | Admitting: Emergency Medicine

## 2018-03-22 ENCOUNTER — Encounter (HOSPITAL_COMMUNITY): Payer: Self-pay | Admitting: Emergency Medicine

## 2018-03-22 ENCOUNTER — Emergency Department (HOSPITAL_COMMUNITY): Payer: Medicare Other

## 2018-03-22 DIAGNOSIS — I10 Essential (primary) hypertension: Secondary | ICD-10-CM | POA: Insufficient documentation

## 2018-03-22 DIAGNOSIS — Z79899 Other long term (current) drug therapy: Secondary | ICD-10-CM | POA: Insufficient documentation

## 2018-03-22 DIAGNOSIS — R509 Fever, unspecified: Secondary | ICD-10-CM

## 2018-03-22 DIAGNOSIS — R6889 Other general symptoms and signs: Secondary | ICD-10-CM

## 2018-03-22 DIAGNOSIS — A419 Sepsis, unspecified organism: Secondary | ICD-10-CM | POA: Diagnosis not present

## 2018-03-22 DIAGNOSIS — J189 Pneumonia, unspecified organism: Secondary | ICD-10-CM | POA: Diagnosis not present

## 2018-03-22 DIAGNOSIS — J181 Lobar pneumonia, unspecified organism: Secondary | ICD-10-CM | POA: Diagnosis not present

## 2018-03-22 DIAGNOSIS — J9611 Chronic respiratory failure with hypoxia: Secondary | ICD-10-CM | POA: Diagnosis not present

## 2018-03-22 DIAGNOSIS — E039 Hypothyroidism, unspecified: Secondary | ICD-10-CM | POA: Insufficient documentation

## 2018-03-22 DIAGNOSIS — Z7984 Long term (current) use of oral hypoglycemic drugs: Secondary | ICD-10-CM

## 2018-03-22 DIAGNOSIS — J811 Chronic pulmonary edema: Secondary | ICD-10-CM | POA: Diagnosis not present

## 2018-03-22 DIAGNOSIS — J111 Influenza due to unidentified influenza virus with other respiratory manifestations: Secondary | ICD-10-CM | POA: Insufficient documentation

## 2018-03-22 DIAGNOSIS — E119 Type 2 diabetes mellitus without complications: Secondary | ICD-10-CM

## 2018-03-22 DIAGNOSIS — R6520 Severe sepsis without septic shock: Secondary | ICD-10-CM | POA: Diagnosis not present

## 2018-03-22 DIAGNOSIS — R531 Weakness: Secondary | ICD-10-CM | POA: Diagnosis not present

## 2018-03-22 DIAGNOSIS — R4182 Altered mental status, unspecified: Secondary | ICD-10-CM | POA: Diagnosis not present

## 2018-03-22 DIAGNOSIS — Z87891 Personal history of nicotine dependence: Secondary | ICD-10-CM | POA: Insufficient documentation

## 2018-03-22 DIAGNOSIS — G9341 Metabolic encephalopathy: Secondary | ICD-10-CM | POA: Diagnosis not present

## 2018-03-22 DIAGNOSIS — F0391 Unspecified dementia with behavioral disturbance: Secondary | ICD-10-CM | POA: Diagnosis not present

## 2018-03-22 DIAGNOSIS — R69 Illness, unspecified: Secondary | ICD-10-CM

## 2018-03-22 DIAGNOSIS — J440 Chronic obstructive pulmonary disease with acute lower respiratory infection: Secondary | ICD-10-CM | POA: Diagnosis not present

## 2018-03-22 DIAGNOSIS — R7881 Bacteremia: Secondary | ICD-10-CM | POA: Diagnosis not present

## 2018-03-22 DIAGNOSIS — R0902 Hypoxemia: Secondary | ICD-10-CM | POA: Diagnosis not present

## 2018-03-22 DIAGNOSIS — R404 Transient alteration of awareness: Secondary | ICD-10-CM | POA: Diagnosis not present

## 2018-03-22 DIAGNOSIS — Z7901 Long term (current) use of anticoagulants: Secondary | ICD-10-CM

## 2018-03-22 DIAGNOSIS — J90 Pleural effusion, not elsewhere classified: Secondary | ICD-10-CM | POA: Diagnosis not present

## 2018-03-22 DIAGNOSIS — A4159 Other Gram-negative sepsis: Secondary | ICD-10-CM | POA: Diagnosis not present

## 2018-03-22 LAB — COMPREHENSIVE METABOLIC PANEL
ALBUMIN: 3.4 g/dL — AB (ref 3.5–5.0)
ALT: 16 U/L (ref 0–44)
AST: 13 U/L — ABNORMAL LOW (ref 15–41)
Alkaline Phosphatase: 58 U/L (ref 38–126)
Anion gap: 8 (ref 5–15)
BUN: 20 mg/dL (ref 8–23)
CO2: 31 mmol/L (ref 22–32)
Calcium: 8.2 mg/dL — ABNORMAL LOW (ref 8.9–10.3)
Chloride: 95 mmol/L — ABNORMAL LOW (ref 98–111)
Creatinine, Ser: 0.61 mg/dL (ref 0.44–1.00)
GFR calc Af Amer: >60 mL/min (ref >60)
GFR calc non Af Amer: >60 mL/min (ref >60)
Glucose, Bld: 161 mg/dL — ABNORMAL HIGH (ref 70–99)
POTASSIUM: 3.5 mmol/L (ref 3.5–5.1)
Sodium: 134 mmol/L — ABNORMAL LOW (ref 135–145)
Total Bilirubin: 0.7 mg/dL (ref 0.3–1.2)
Total Protein: 5.7 g/dL — ABNORMAL LOW (ref 6.5–8.1)

## 2018-03-22 LAB — URINALYSIS, ROUTINE W REFLEX MICROSCOPIC
Bilirubin Urine: NEGATIVE
Glucose, UA: NEGATIVE mg/dL
Hgb urine dipstick: NEGATIVE
Ketones, ur: NEGATIVE mg/dL
Leukocytes, UA: NEGATIVE
Nitrite: NEGATIVE
Protein, ur: NEGATIVE mg/dL
Specific Gravity, Urine: 1.013 (ref 1.005–1.030)
pH: 7 (ref 5.0–8.0)

## 2018-03-22 LAB — I-STAT CG4 LACTIC ACID, ED: Lactic Acid, Venous: 0.96 mmol/L (ref 0.5–1.9)

## 2018-03-22 LAB — INFLUENZA PANEL BY PCR (TYPE A & B)
Influenza A By PCR: NEGATIVE
Influenza B By PCR: NEGATIVE

## 2018-03-22 LAB — CBC
HCT: 41.7 % (ref 36.0–46.0)
HEMOGLOBIN: 13.1 g/dL (ref 12.0–15.0)
MCH: 29.8 pg (ref 26.0–34.0)
MCHC: 31.4 g/dL (ref 30.0–36.0)
MCV: 95 fL (ref 80.0–100.0)
Platelets: 211 10*3/uL (ref 150–400)
RBC: 4.39 MIL/uL (ref 3.87–5.11)
RDW: 12.8 % (ref 11.5–15.5)
WBC: 13.7 10*3/uL — ABNORMAL HIGH (ref 4.0–10.5)
nRBC: 0 % (ref 0.0–0.2)

## 2018-03-22 LAB — CBG MONITORING, ED: Glucose-Capillary: 153 mg/dL — ABNORMAL HIGH (ref 70–99)

## 2018-03-22 MED ORDER — SODIUM CHLORIDE 0.9 % IV SOLN
1000.0000 mL | INTRAVENOUS | Status: DC
  Administered 2018-03-22: 1000 mL via INTRAVENOUS

## 2018-03-22 MED ORDER — SODIUM CHLORIDE 0.9 % IV BOLUS (SEPSIS)
500.0000 mL | Freq: Once | INTRAVENOUS | Status: AC
  Administered 2018-03-22: 500 mL via INTRAVENOUS

## 2018-03-22 MED ORDER — OSELTAMIVIR PHOSPHATE 75 MG PO CAPS: 75.0000 mg | ORAL_CAPSULE | Freq: Two times a day (BID) | ORAL | 0 refills | Status: DC

## 2018-03-22 MED ORDER — OSELTAMIVIR PHOSPHATE 75 MG PO CAPS
75.0000 mg | ORAL_CAPSULE | Freq: Once | ORAL | Status: AC
  Administered 2018-03-22: 75 mg via ORAL
  Filled 2018-03-22: qty 1

## 2018-03-22 NOTE — ED Provider Notes (Signed)
[facility] Provider Note   CSN: [id number] Arrival date & time: 03/22/18  1405     History   Chief Complaint Chief Complaint  Patient presents with  . Altered Mental Status  . Fever    HPI Paula Andrews is a 72 y.o. female.  HPI Pt presents to the ED for evaluation of shaking.  Family states she was at home today.  She started shaking and this lasted maybe 30 minutes.  During the episode, she was able to communicate but she was not acting completely normally.  She did not have lost of consciousness.  EMS was called.  When they arrived they noted her blood sugar was 202.  Patient was given an antipyretic.  Patient right now states she feels fine.  She has no complaints.  She does not remember the episode but she does have dementia.  She denies any chest pain or shortness of breath.  No coughing.  No vomiting or diarrhea.  No dysuria Past Medical History:  Diagnosis Date  . Dementia ([place])   . Diabetes mellitus   . Hypertension   . Thyroid disease     Patient Active Problem List   Diagnosis Date Noted  . Pulmonary embolus ([place])   . Acute respiratory failure with hypoxia ([place]) 03/13/2018  . Acute encephalopathy 03/13/2018  . Fall 03/13/2018  . Sinus bradycardia 03/13/2018  . Dementia ([place]) 03/13/2018  . Hypothyroidism 03/13/2018  . Mood disorder ([place]) 03/13/2018  . COPD with acute exacerbation ([place]) 08/07/2017  . 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 Chronic respiratory failure with hypoxia and hypercapnia (HCC)   . Acute respiratory failure with hypoxia and hypercapnia ([place]) 05/30/2017  . Acute lower UTI 05/30/2017  . HTN (hypertension) 05/30/2017  . Diabetes mellitus type II, controlled ([place]) 05/30/2017  . HLD (hyperlipidemia) 05/30/2017  . UTI (urinary tract infection) 05/30/2017  . Hypoxia 05/29/2017  . Hyponatremia 05/16/2017  . Fracture of radial neck, left, closed 01/29/2012  . CLOSED FRACTURE  UNSPEC PART UPPER END HUMERUS 04/13/2010    Past Surgical History:  Procedure Laterality Date  . ABDOMINAL HYSTERECTOMY       OB History   No obstetric history on file.      Home Medications    Prior to Admission medications   Medication Sig Start Date End Date Taking? Authorizing Provider  amLODipine (NORVASC) 5 MG tablet Take 1 tablet (5 mg total) by mouth daily. 05/19/17 08/07/18 Yes Johnson, Clanford L, MD  cyanocobalamin (,VITAMIN B-12,) 1000 MCG/ML injection Inject daily for 5 more days; then once a week for a month; then monthly. 03/17/18  Yes Barton Dubois, MD  donepezil (ARICEPT) 10 MG tablet Take 10 mg by mouth at bedtime.   Yes [provider]  Fluticasone-Salmeterol (ADVAIR DISKUS) 250-50 MCG/DOSE AEPB Inhale 1 puff into the lungs 2 (two) times daily. 03/17/18 06/15/18 Yes Barton Dubois, MD  levothyroxine (SYNTHROID, LEVOTHROID) 75 MCG tablet Take 75 mcg by mouth daily.    Yes [provider]  lisinopril (PRINIVIL,ZESTRIL) 5 MG tablet Take 5 mg by mouth daily.  05/26/17  Yes [provider]  memantine (NAMENDA) 10 MG tablet Take 10 mg by mouth 2 (two) times daily. 02/22/18  Yes [provider]  metFORMIN (GLUCOPHAGE) 500 MG tablet Take 500 mg by mouth daily as needed (for high blood sugars).    Yes [provider]  metoprolol tartrate (LOPRESSOR) 25 MG tablet Take 25 mg by mouth 2 (two)  times daily. 01/24/18  Yes [provider]  OLANZapine (ZYPREXA) 2.5 MG tablet Take 1 tablet by mouth at bedtime. 01/10/18  Yes [provider]  pantoprazole (PROTONIX) 40 MG tablet Take 1 tablet (40 mg total) by mouth daily. 03/18/18  Yes Barton Dubois, MD  pravastatin (PRAVACHOL) 20 MG tablet Take 20 mg by mouth every morning.    Yes [provider]  Rivaroxaban (XARELTO) 15 MG TABS tablet Take 1 tablet (15 mg total) by mouth 2 (two) times daily with a meal for 19 days. 03/17/18 04/05/18 Yes Barton Dubois, MD    acetaminophen (TYLENOL) 325 MG tablet Take 650 mg by mouth 2 (two) times daily as needed for mild pain or moderate pain.    [provider]  albuterol (PROVENTIL HFA;VENTOLIN HFA) 108 (90 Base) MCG/ACT inhaler Inhale 2 puffs into the lungs every 6 (six) hours as needed for wheezing or shortness of breath. Patient not taking: Reported on 03/22/2018 06/01/17   Raiford Noble Latif, DO  oseltamivir (TAMIFLU) 75 MG capsule Take 1 capsule (75 mg total) by mouth 2 (two) times daily. 03/22/18   Dorie Rank, MD  predniSONE (DELTASONE) 20 MG tablet Take 2 tablets by mouth daily for 5 days and stop prednisone. Patient not taking: Reported on 03/22/2018 03/17/18   Barton Dubois, MD  rivaroxaban (XARELTO) 20 MG TABS tablet Take 1 tablet (20 mg total) by mouth daily with supper. 04/06/18   Barton Dubois, MD  traZODone (DESYREL) 50 MG tablet Take 50 mg by mouth at bedtime as needed for sleep.  01/10/18 03/13/18  [provider]    Family History Family History  Problem Relation Age of Onset  . Diabetes Other     Social History Social History   Tobacco Use  . Smoking status: Former Smoker    Packs/day: 1.00    Years: 41.00    Pack years: 41.00    Types: Cigarettes    Last attempt to quit: 04/03/2001    Years since quitting: 16.9  . Smokeless tobacco: Never Used  Substance Use Topics  . Alcohol use: No  . Drug use: No     Allergies   Patient has no known allergies.   Review of Systems Review of Systems  All other systems reviewed and are negative.    Physical Exam Updated Vital Signs BP (!) 147/58 (BP Location: Left Arm)   Pulse (!) 57   Temp 100 F (37.8 C) (Rectal)   Resp (!) 22   Ht 1.626 m (5\' 4" )   Wt 73.4 kg   SpO2 97%   BMI 27.78 kg/m   Physical Exam Vitals signs and nursing note reviewed.  Constitutional:      General: She is not in acute distress.    Appearance: Normal appearance. She is well-developed. She is not diaphoretic.  HENT:     Head:  Normocephalic and atraumatic.     Right Ear: External ear normal.     Left Ear: External ear normal.  Eyes:     General: No scleral icterus.       Right eye: No discharge.        Left eye: No discharge.     Conjunctiva/sclera: Conjunctivae normal.  Neck:     Musculoskeletal: Neck supple.     Trachea: No tracheal deviation.  Cardiovascular:     Rate and Rhythm: Normal rate and regular rhythm.  Pulmonary:     Effort: Pulmonary effort is normal. No respiratory distress.     Breath  sounds: Normal breath sounds. No stridor. No wheezing or rales.  Abdominal:     General: Bowel sounds are normal. There is no distension.     Palpations: Abdomen is soft.     Tenderness: There is no abdominal tenderness. There is no guarding or rebound.  Musculoskeletal:        General: No tenderness.  Skin:    General: Skin is warm and dry.     Findings: No rash.  Neurological:     General: No focal deficit present.     Mental Status: She is alert.     Cranial Nerves: No cranial nerve deficit (no facial droop, extraocular movements intact, no slurred speech).     Sensory: No sensory deficit.     Motor: No abnormal muscle tone or seizure activity.     Coordination: Coordination normal.  Psychiatric:        Mood and Affect: Mood normal.      ED Treatments / Results  Labs (all labs ordered are listed, but only abnormal results are displayed) Labs Reviewed  COMPREHENSIVE METABOLIC PANEL - Abnormal; Notable for the following components:      Result Value   Sodium 134 (*)    Chloride 95 (*)    Glucose, Bld 161 (*)    Calcium 8.2 (*)    Total Protein 5.7 (*)    Albumin 3.4 (*)    AST 13 (*)    All other components within normal limits  CBC - Abnormal; Notable for the following components:   WBC 13.7 (*)    All other components within normal limits  CBG MONITORING, ED - Abnormal; Notable for the following components:   Glucose-Capillary 153 (*)    All other components within normal limits    CULTURE, BLOOD (ROUTINE X 2)  CULTURE, BLOOD (ROUTINE X 2)  URINALYSIS, ROUTINE W REFLEX MICROSCOPIC  INFLUENZA PANEL BY PCR (TYPE A & B)  I-STAT CG4 LACTIC ACID, ED  I-STAT CG4 LACTIC ACID, ED    EKG EKG Interpretation  Date/Time:  Friday March 22 2018 16:12:00 EST Ventricular Rate:  57 PR Interval:    QRS Duration: 86 QT Interval:  460 QTC Calculation: 448 R Axis:   59 Text Interpretation:  Sinus rhythm No significant change since last tracing Confirmed by Dorie Rank [phone]) on 03/22/2018 4:48:58 PM   Radiology Dg Chest 2 View  Result Date: 03/22/2018 CLINICAL DATA:  Fever and altered mental status. EXAM: CHEST - 2 VIEW COMPARISON:  03/13/2018 FINDINGS: The heart size and mediastinal contours are within normal limits. Both lungs are clear. The visualized skeletal structures are unremarkable. IMPRESSION: No evidence for pneumonia. Electronically Signed   By: Kerby Moors M.D.   On: 03/22/2018 17:01    Procedures Procedures (including critical care time)  Medications Ordered in ED Medications  sodium chloride 0.9 % bolus 500 mL (has no administration in time range)    Followed by  0.9 %  sodium chloride infusion (has no administration in time range)  oseltamivir (TAMIFLU) capsule 75 mg (has no administration in time range)     Initial Impression / Assessment and Plan / ED Course  I have reviewed the triage vital signs and the nursing notes.  Pertinent labs & imaging results that were available during my care of the patient were reviewed by me and considered in my medical decision making (see chart for details).  Clinical Course as of Mar 22 1753  Fri Mar 22, 2018  1648 White blood cell  count elevated.  Urinalysis is not consistent with UTI.   [JK]    Clinical Course User Index [JK] Dorie Rank, MD    Pt presented to the ED for evaluation of a shaking spell associated with a fever.  Shaking episode seems to be most consistent with Reiger's as opposed to a  seizure episode.  Patient was recently in the hospital for a syncopal episode and was diagnosed with a pulmonary embolism.  Patient's examination today is reassuring.  She was monitored for several hours and did not have any recurrent episodes.  She did have an elevated white blood cell count otherwise her electrolyte panel is normal.  Her lactic acid level is normal.  Her urinalysis does not show UTI.  Chest x-ray does not show pneumonia.  No signs of sepsis.  I do not have a definite source for her infection.  I am concerned about the possibility of influenza considering she was recently in hospital and could have been exposed.  I will have her start a course of Tamiflu.  I will send off an influenza test.  Patient is feeling well and she and her husband are comfortable going home.  I recommend she follow-up with her primary care doctor.  Warning signs and precautions discussed.  Final Clinical Impressions(s) / ED Diagnoses   Final diagnoses:  Fever, unspecified fever cause  Rigor  Influenza-like illness    ED Discharge Orders         Ordered    oseltamivir (TAMIFLU) 75 MG capsule  2 times daily     03/22/18 1753           Dorie Rank, MD 03/22/18 1756

## 2018-03-22 NOTE — Discharge Instructions (Addendum)
Take the medications as prescribed, follow-up with your primary care doctor to be rechecked, return to the emergency room for worsening symptoms

## 2018-03-22 NOTE — ED Triage Notes (Signed)
Per EMS patient call out by family for fever and AMS. Patient is lethargic but alert x 4 in triage. Patient's daughter states patient had fever that rose to 102 at home.

## 2018-03-23 ENCOUNTER — Emergency Department (HOSPITAL_COMMUNITY): Payer: Medicare Other

## 2018-03-23 ENCOUNTER — Telehealth (HOSPITAL_BASED_OUTPATIENT_CLINIC_OR_DEPARTMENT_OTHER): Payer: Self-pay | Admitting: Emergency Medicine

## 2018-03-23 ENCOUNTER — Other Ambulatory Visit: Payer: Self-pay

## 2018-03-23 ENCOUNTER — Inpatient Hospital Stay (HOSPITAL_COMMUNITY)
Admission: EM | Admit: 2018-03-23 | Discharge: 2018-03-25 | DRG: 871 | Disposition: A | Discharge status: Home / Self Care | Payer: Medicare Other | Attending: Internal Medicine | Admitting: Internal Medicine

## 2018-03-23 ENCOUNTER — Encounter (HOSPITAL_COMMUNITY): Payer: Self-pay | Admitting: Emergency Medicine

## 2018-03-23 DIAGNOSIS — Z9981 Dependence on supplemental oxygen: Secondary | ICD-10-CM

## 2018-03-23 DIAGNOSIS — Z79890 Hormone replacement therapy (postmenopausal): Secondary | ICD-10-CM

## 2018-03-23 DIAGNOSIS — J9611 Chronic respiratory failure with hypoxia: Secondary | ICD-10-CM | POA: Diagnosis not present

## 2018-03-23 DIAGNOSIS — J189 Pneumonia, unspecified organism: Secondary | ICD-10-CM | POA: Diagnosis not present

## 2018-03-23 DIAGNOSIS — F0391 Unspecified dementia with behavioral disturbance: Secondary | ICD-10-CM | POA: Diagnosis present

## 2018-03-23 DIAGNOSIS — A4159 Other Gram-negative sepsis: Principal | ICD-10-CM

## 2018-03-23 DIAGNOSIS — R7881 Bacteremia: Secondary | ICD-10-CM

## 2018-03-23 DIAGNOSIS — I110 Hypertensive heart disease with heart failure: Secondary | ICD-10-CM | POA: Diagnosis present

## 2018-03-23 DIAGNOSIS — A4150 Gram-negative sepsis, unspecified: Secondary | ICD-10-CM

## 2018-03-23 DIAGNOSIS — J440 Chronic obstructive pulmonary disease with acute lower respiratory infection: Secondary | ICD-10-CM | POA: Diagnosis present

## 2018-03-23 DIAGNOSIS — Z86711 Personal history of pulmonary embolism: Secondary | ICD-10-CM | POA: Diagnosis not present

## 2018-03-23 DIAGNOSIS — E039 Hypothyroidism, unspecified: Secondary | ICD-10-CM | POA: Diagnosis present

## 2018-03-23 DIAGNOSIS — J90 Pleural effusion, not elsewhere classified: Secondary | ICD-10-CM | POA: Diagnosis not present

## 2018-03-23 DIAGNOSIS — J9612 Chronic respiratory failure with hypercapnia: Secondary | ICD-10-CM | POA: Diagnosis not present

## 2018-03-23 DIAGNOSIS — R6520 Severe sepsis without septic shock: Secondary | ICD-10-CM

## 2018-03-23 DIAGNOSIS — Z7984 Long term (current) use of oral hypoglycemic drugs: Secondary | ICD-10-CM | POA: Diagnosis not present

## 2018-03-23 DIAGNOSIS — Z79899 Other long term (current) drug therapy: Secondary | ICD-10-CM | POA: Diagnosis not present

## 2018-03-23 DIAGNOSIS — G9340 Encephalopathy, unspecified: Secondary | ICD-10-CM | POA: Diagnosis not present

## 2018-03-23 DIAGNOSIS — Z7951 Long term (current) use of inhaled steroids: Secondary | ICD-10-CM

## 2018-03-23 DIAGNOSIS — J811 Chronic pulmonary edema: Secondary | ICD-10-CM | POA: Diagnosis not present

## 2018-03-23 DIAGNOSIS — E785 Hyperlipidemia, unspecified: Secondary | ICD-10-CM | POA: Diagnosis present

## 2018-03-23 DIAGNOSIS — E876 Hypokalemia: Secondary | ICD-10-CM | POA: Diagnosis present

## 2018-03-23 DIAGNOSIS — Y95 Nosocomial condition: Secondary | ICD-10-CM | POA: Diagnosis present

## 2018-03-23 DIAGNOSIS — A419 Sepsis, unspecified organism: Secondary | ICD-10-CM | POA: Diagnosis present

## 2018-03-23 DIAGNOSIS — G9341 Metabolic encephalopathy: Secondary | ICD-10-CM | POA: Diagnosis present

## 2018-03-23 DIAGNOSIS — Z87891 Personal history of nicotine dependence: Secondary | ICD-10-CM

## 2018-03-23 DIAGNOSIS — Z7952 Long term (current) use of systemic steroids: Secondary | ICD-10-CM

## 2018-03-23 DIAGNOSIS — E538 Deficiency of other specified B group vitamins: Secondary | ICD-10-CM | POA: Diagnosis present

## 2018-03-23 DIAGNOSIS — I509 Heart failure, unspecified: Secondary | ICD-10-CM | POA: Diagnosis present

## 2018-03-23 DIAGNOSIS — J181 Lobar pneumonia, unspecified organism: Secondary | ICD-10-CM

## 2018-03-23 DIAGNOSIS — E119 Type 2 diabetes mellitus without complications: Secondary | ICD-10-CM | POA: Diagnosis present

## 2018-03-23 DIAGNOSIS — Z66 Do not resuscitate: Secondary | ICD-10-CM | POA: Diagnosis present

## 2018-03-23 LAB — URINALYSIS, ROUTINE W REFLEX MICROSCOPIC
BILIRUBIN URINE: NEGATIVE
Glucose, UA: NEGATIVE mg/dL
HGB URINE DIPSTICK: NEGATIVE
Ketones, ur: NEGATIVE mg/dL
Leukocytes, UA: NEGATIVE
Nitrite: NEGATIVE
Protein, ur: NEGATIVE mg/dL
SPECIFIC GRAVITY, URINE: 1.015 (ref 1.005–1.030)
pH: 7 (ref 5.0–8.0)

## 2018-03-23 LAB — COMPREHENSIVE METABOLIC PANEL
ALT: 17 U/L (ref 0–44)
ALT: 17 U/L (ref 0–44)
AST: 18 U/L (ref 15–41)
AST: 17 U/L (ref 15–41)
Albumin: 3.2 g/dL — ABNORMAL LOW (ref 3.5–5.0)
Albumin: 3 g/dL — ABNORMAL LOW (ref 3.5–5.0)
Alkaline Phosphatase: 59 U/L (ref 38–126)
Alkaline Phosphatase: 54 U/L (ref 38–126)
Anion gap: 8 (ref 5–15)
Anion gap: 7 (ref 5–15)
BILIRUBIN TOTAL: 0.7 mg/dL (ref 0.3–1.2)
BUN: 22 mg/dL (ref 8–23)
BUN: 19 mg/dL (ref 8–23)
CO2: 30 mmol/L (ref 22–32)
CO2: 30 mmol/L (ref 22–32)
CREATININE: 0.78 mg/dL (ref 0.44–1.00)
Calcium: 8.1 mg/dL — ABNORMAL LOW (ref 8.9–10.3)
Calcium: 8 mg/dL — ABNORMAL LOW (ref 8.9–10.3)
Chloride: 97 mmol/L — ABNORMAL LOW (ref 98–111)
Chloride: 100 mmol/L (ref 98–111)
Creatinine, Ser: 1.02 mg/dL — ABNORMAL HIGH (ref 0.44–1.00)
GFR calc Af Amer: >60 mL/min (ref >60)
GFR calc Af Amer: >60 mL/min (ref >60)
GFR calc non Af Amer: >60 mL/min (ref >60)
GFR calc non Af Amer: 55 mL/min — ABNORMAL LOW (ref >60)
Glucose, Bld: 163 mg/dL — ABNORMAL HIGH (ref 70–99)
Glucose, Bld: 179 mg/dL — ABNORMAL HIGH (ref 70–99)
Potassium: 3.3 mmol/L — ABNORMAL LOW (ref 3.5–5.1)
Potassium: 3.5 mmol/L (ref 3.5–5.1)
Sodium: 135 mmol/L (ref 135–145)
Sodium: 137 mmol/L (ref 135–145)
TOTAL PROTEIN: 5.4 g/dL — AB (ref 6.5–8.1)
Total Bilirubin: 0.6 mg/dL (ref 0.3–1.2)
Total Protein: 5.7 g/dL — ABNORMAL LOW (ref 6.5–8.1)

## 2018-03-23 LAB — GLUCOSE, CAPILLARY: Glucose-Capillary: 131 mg/dL — ABNORMAL HIGH (ref 70–99)

## 2018-03-23 LAB — BLOOD GAS, ARTERIAL
Acid-Base Excess: 9.5 mmol/L — ABNORMAL HIGH (ref 0.0–2.0)
Bicarbonate: 32.4 mmol/L — ABNORMAL HIGH (ref 20.0–28.0)
Drawn by: 28459
O2 Content: 2 L/min
O2 SAT: 93.3 %
PATIENT TEMPERATURE: 37
pCO2 arterial: 50.1 mmHg — ABNORMAL HIGH (ref 32.0–48.0)
pH, Arterial: 7.445 (ref 7.350–7.450)
pO2, Arterial: 65.8 mmHg — ABNORMAL LOW (ref 83.0–108.0)

## 2018-03-23 LAB — PROTIME-INR
INR: 1.51
Prothrombin Time: 18 seconds — ABNORMAL HIGH (ref 11.4–15.2)

## 2018-03-23 LAB — CBC WITH DIFFERENTIAL/PLATELET
Abs Immature Granulocytes: 0.19 10*3/uL — ABNORMAL HIGH (ref 0.00–0.07)
Basophils Absolute: 0 10*3/uL (ref 0.0–0.1)
Basophils Relative: 0 %
EOS PCT: 1 %
Eosinophils Absolute: 0.1 10*3/uL (ref 0.0–0.5)
HCT: 42.8 % (ref 36.0–46.0)
Hemoglobin: 13.1 g/dL (ref 12.0–15.0)
Immature Granulocytes: 2 %
Lymphocytes Relative: 8 %
Lymphs Abs: 0.7 10*3/uL (ref 0.7–4.0)
MCH: 29.6 pg (ref 26.0–34.0)
MCHC: 30.6 g/dL (ref 30.0–36.0)
MCV: 96.6 fL (ref 80.0–100.0)
Monocytes Absolute: 0.6 10*3/uL (ref 0.1–1.0)
Monocytes Relative: 6 %
NRBC: 0 % (ref 0.0–0.2)
Neutro Abs: 7.2 10*3/uL (ref 1.7–7.7)
Neutrophils Relative %: 83 %
Platelets: 188 10*3/uL (ref 150–400)
RBC: 4.43 MIL/uL (ref 3.87–5.11)
RDW: 12.9 % (ref 11.5–15.5)
WBC: 8.7 10*3/uL (ref 4.0–10.5)

## 2018-03-23 LAB — CG4 I-STAT (LACTIC ACID)
Lactic Acid, Venous: 2.38 mmol/L (ref 0.5–1.9)
Lactic Acid, Venous: 1.4 mmol/L (ref 0.5–1.9)

## 2018-03-23 LAB — BLOOD CULTURE ID PANEL (REFLEXED)
Acinetobacter baumannii: NOT DETECTED
CARBAPENEM RESISTANCE: NOT DETECTED
Candida albicans: NOT DETECTED
Candida glabrata: NOT DETECTED
Candida krusei: NOT DETECTED
Candida parapsilosis: NOT DETECTED
Candida tropicalis: NOT DETECTED
ENTEROBACTERIACEAE SPECIES: DETECTED — AB
Enterobacter cloacae complex: DETECTED — AB
Enterococcus species: NOT DETECTED
Escherichia coli: NOT DETECTED
Haemophilus influenzae: NOT DETECTED
Klebsiella oxytoca: NOT DETECTED
Klebsiella pneumoniae: NOT DETECTED
Listeria monocytogenes: NOT DETECTED
Neisseria meningitidis: NOT DETECTED
PSEUDOMONAS AERUGINOSA: NOT DETECTED
Proteus species: NOT DETECTED
Serratia marcescens: NOT DETECTED
Staphylococcus aureus (BCID): NOT DETECTED
Staphylococcus species: NOT DETECTED
Streptococcus agalactiae: NOT DETECTED
Streptococcus pneumoniae: NOT DETECTED
Streptococcus pyogenes: NOT DETECTED
Streptococcus species: NOT DETECTED

## 2018-03-23 LAB — MAGNESIUM: Magnesium: 1.7 mg/dL (ref 1.7–2.4)

## 2018-03-23 LAB — BRAIN NATRIURETIC PEPTIDE: B NATRIURETIC PEPTIDE 5: 60 pg/mL (ref 0.0–100.0)

## 2018-03-23 LAB — LACTIC ACID, PLASMA: Lactic Acid, Venous: 1.6 mmol/L (ref 0.5–1.9)

## 2018-03-23 LAB — MRSA PCR SCREENING: MRSA by PCR: NEGATIVE

## 2018-03-23 LAB — APTT: aPTT: 43 seconds — ABNORMAL HIGH (ref 24–36)

## 2018-03-23 LAB — PROCALCITONIN: Procalcitonin: 2.84 ng/mL

## 2018-03-23 MED ORDER — POTASSIUM CHLORIDE IN NACL 20-0.9 MEQ/L-% IV SOLN
INTRAVENOUS | Status: DC
  Administered 2018-03-23 – 2018-03-24 (×2): via INTRAVENOUS

## 2018-03-23 MED ORDER — INSULIN ASPART 100 UNIT/ML ~~LOC~~ SOLN
0.0000 [IU] | Freq: Three times a day (TID) | SUBCUTANEOUS | Status: DC
  Administered 2018-03-24: 3 [IU] via SUBCUTANEOUS
  Administered 2018-03-24: 2 [IU] via SUBCUTANEOUS
  Administered 2018-03-25: 1 [IU] via SUBCUTANEOUS
  Administered 2018-03-25: 3 [IU] via SUBCUTANEOUS

## 2018-03-23 MED ORDER — IPRATROPIUM-ALBUTEROL 0.5-2.5 (3) MG/3ML IN SOLN: 3.0000 mL | Freq: Three times a day (TID) | RESPIRATORY_TRACT | Status: DC

## 2018-03-23 MED ORDER — ONDANSETRON HCL 4 MG/2ML IJ SOLN: 4.0000 mg | Freq: Four times a day (QID) | INTRAMUSCULAR | Status: DC | PRN

## 2018-03-23 MED ORDER — SODIUM CHLORIDE 0.9 % IV BOLUS: 1000.0000 mL | Freq: Once | INTRAVENOUS | Status: DC

## 2018-03-23 MED ORDER — ONDANSETRON HCL 4 MG PO TABS: 4.0000 mg | ORAL_TABLET | Freq: Four times a day (QID) | ORAL | Status: DC | PRN

## 2018-03-23 MED ORDER — MEMANTINE HCL 10 MG PO TABS
10.0000 mg | ORAL_TABLET | Freq: Two times a day (BID) | ORAL | Status: DC
  Administered 2018-03-23 – 2018-03-25 (×4): 10 mg via ORAL
  Filled 2018-03-23 (×4): qty 1

## 2018-03-23 MED ORDER — METOPROLOL TARTRATE 25 MG PO TABS
25.0000 mg | ORAL_TABLET | Freq: Two times a day (BID) | ORAL | Status: DC
  Administered 2018-03-23 – 2018-03-25 (×4): 25 mg via ORAL
  Filled 2018-03-23 (×4): qty 1

## 2018-03-23 MED ORDER — RIVAROXABAN 15 MG PO TABS
15.0000 mg | ORAL_TABLET | Freq: Two times a day (BID) | ORAL | Status: DC
  Administered 2018-03-23 – 2018-03-25 (×4): 15 mg via ORAL
  Filled 2018-03-23 (×4): qty 1

## 2018-03-23 MED ORDER — SODIUM CHLORIDE 0.9 % IV SOLN
2.0000 g | Freq: Once | INTRAVENOUS | Status: AC
  Administered 2018-03-23: 2 g via INTRAVENOUS
  Filled 2018-03-23: qty 2

## 2018-03-23 MED ORDER — LEVOTHYROXINE SODIUM 75 MCG PO TABS
75.0000 ug | ORAL_TABLET | Freq: Every day | ORAL | Status: DC
  Administered 2018-03-24 – 2018-03-25 (×2): 75 ug via ORAL
  Filled 2018-03-23 (×2): qty 1

## 2018-03-23 MED ORDER — ACETAMINOPHEN 325 MG PO TABS: 650.0000 mg | ORAL_TABLET | Freq: Four times a day (QID) | ORAL | Status: DC | PRN

## 2018-03-23 MED ORDER — OLANZAPINE 5 MG PO TABS
2.5000 mg | ORAL_TABLET | Freq: Every day | ORAL | Status: DC
  Administered 2018-03-23 – 2018-03-24 (×2): 2.5 mg via ORAL
  Filled 2018-03-23 (×2): qty 1

## 2018-03-23 MED ORDER — DONEPEZIL HCL 5 MG PO TABS
10.0000 mg | ORAL_TABLET | Freq: Every day | ORAL | Status: DC
  Administered 2018-03-23 – 2018-03-24 (×2): 10 mg via ORAL
  Filled 2018-03-23 (×2): qty 2

## 2018-03-23 MED ORDER — PRAVASTATIN SODIUM 10 MG PO TABS
20.0000 mg | ORAL_TABLET | Freq: Every day | ORAL | Status: DC
  Administered 2018-03-24: 20 mg via ORAL
  Filled 2018-03-23: qty 2

## 2018-03-23 MED ORDER — SODIUM CHLORIDE 0.9 % IV SOLN: 2.0000 g | Freq: Once | INTRAVENOUS | Status: DC

## 2018-03-23 MED ORDER — VANCOMYCIN HCL IN DEXTROSE 1-5 GM/200ML-% IV SOLN
1000.0000 mg | Freq: Once | INTRAVENOUS | Status: AC
  Administered 2018-03-23: 1000 mg via INTRAVENOUS
  Filled 2018-03-23: qty 200

## 2018-03-23 MED ORDER — SODIUM CHLORIDE 0.9 % IV BOLUS
1000.0000 mL | Freq: Once | INTRAVENOUS | Status: AC
  Administered 2018-03-23: 1000 mL via INTRAVENOUS

## 2018-03-23 MED ORDER — ACETAMINOPHEN 650 MG RE SUPP: 650.0000 mg | Freq: Four times a day (QID) | RECTAL | Status: DC | PRN

## 2018-03-23 MED ORDER — PANTOPRAZOLE SODIUM 40 MG PO TBEC
40.0000 mg | DELAYED_RELEASE_TABLET | Freq: Every day | ORAL | Status: DC
  Administered 2018-03-24 – 2018-03-25 (×2): 40 mg via ORAL
  Filled 2018-03-23 (×2): qty 1

## 2018-03-23 MED ORDER — SODIUM CHLORIDE 0.9 % IV SOLN
2.0000 g | Freq: Three times a day (TID) | INTRAVENOUS | Status: DC
  Administered 2018-03-23 – 2018-03-25 (×6): 2 g via INTRAVENOUS
  Filled 2018-03-23 (×10): qty 2

## 2018-03-23 NOTE — ED Provider Notes (Signed)
[facility] EMERGENCY DEPARTMENT Provider Note   CSN: [id number] Arrival date & time: 03/23/18  8756   LEVEL 5 CAVEAT - DEMENTIA   History   Chief Complaint Chief Complaint  Patient presents with  . Abnormal Lab    HPI Paula Andrews is a 72 y.o. female.  HPI  72 year old female with a history of PE, dementia, diabetes, hypertension and thyroid disease presents with positive blood culture.  She is had a fever since yesterday up to 102+.  She was seen here yesterday after she was having severe rigors.  The patient's been having mild cough and rhinorrhea.  She is chronically supposed to be wearing oxygen over these last 4 weeks and is on 2 L.  She has not had any other new symptoms and denies headache or other acute pain.  Family states she is a little more altered than her normal dementia baseline, citing confusion is the main sign they have noticed.  Blood cultures obtained yesterday have grown 1 out of 2 cultures positive for gram-negative rods.  Was given Tylenol about 1 hour ago.  Past Medical History:  Diagnosis Date  . Dementia ([place])   . Diabetes mellitus   . Hypertension   . Thyroid disease     Patient Active Problem List   Diagnosis Date Noted  . Sepsis due to undetermined organism ([place]) 03/23/2018  . Pulmonary embolus ([place])   . Acute respiratory failure with hypoxia ([place]) 03/13/2018  . Acute encephalopathy 03/13/2018  . Fall 03/13/2018  . Sinus bradycardia 03/13/2018  . Dementia ([place]) 03/13/2018  . Hypothyroidism 03/13/2018  . Mood disorder ([place]) 03/13/2018  . COPD with acute exacerbation ([place]) 08/07/2017  . 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 Chronic respiratory failure with hypoxia and hypercapnia (HCC)   . Acute respiratory failure with hypoxia and hypercapnia ([place]) 05/30/2017  . Acute lower UTI 05/30/2017  . HTN (hypertension) 05/30/2017  . Diabetes mellitus type II, controlled ([place]) 05/30/2017  . HLD  (hyperlipidemia) 05/30/2017  . UTI (urinary tract infection) 05/30/2017  . Hypoxia 05/29/2017  . Hyponatremia 05/16/2017  . Fracture of radial neck, left, closed 01/29/2012  . CLOSED FRACTURE UNSPEC PART UPPER END HUMERUS 04/13/2010    Past Surgical History:  Procedure Laterality Date  . ABDOMINAL HYSTERECTOMY       OB History   No obstetric history on file.      Home Medications    Prior to Admission medications   Medication Sig Start Date End Date Taking? Authorizing Provider  acetaminophen (TYLENOL) 325 MG tablet Take 650 mg by mouth 2 (two) times daily as needed for mild pain or moderate pain.   Yes [provider]  amLODipine (NORVASC) 5 MG tablet Take 1 tablet (5 mg total) by mouth daily. 05/19/17 08/07/18 Yes Johnson, Clanford L, MD  cyanocobalamin (,VITAMIN B-12,) 1000 MCG/ML injection Inject daily for 5 more days; then once a week for a month; then monthly. 03/17/18  Yes Barton Dubois, MD  donepezil (ARICEPT) 10 MG tablet Take 10 mg by mouth at bedtime.   Yes [provider]  Fluticasone-Salmeterol (ADVAIR DISKUS) 250-50 MCG/DOSE AEPB Inhale 1 puff into the lungs 2 (two) times daily. 03/17/18 06/15/18 Yes Barton Dubois, MD  levothyroxine (SYNTHROID, LEVOTHROID) 75 MCG tablet Take 75 mcg by mouth daily.    Yes [provider]  lisinopril (PRINIVIL,ZESTRIL) 5 MG tablet Take 5 mg by mouth daily.  05/26/17  Yes [provider]  memantine (NAMENDA) 10 MG  tablet Take 10 mg by mouth 2 (two) times daily. 02/22/18  Yes [provider]  metFORMIN (GLUCOPHAGE) 500 MG tablet Take 500 mg by mouth daily as needed (for high blood sugars).    Yes [provider]  metoprolol tartrate (LOPRESSOR) 25 MG tablet Take 25 mg by mouth 2 (two) times daily. 01/24/18  Yes [provider]  OLANZapine (ZYPREXA) 2.5 MG tablet Take 1 tablet by mouth at bedtime. 01/10/18  Yes [provider]  pantoprazole (PROTONIX) 40 MG tablet Take 1  tablet (40 mg total) by mouth daily. 03/18/18  Yes Barton Dubois, MD  pravastatin (PRAVACHOL) 20 MG tablet Take 20 mg by mouth every morning.    Yes [provider]  Rivaroxaban (XARELTO) 15 MG TABS tablet Take 1 tablet (15 mg total) by mouth 2 (two) times daily with a meal for 19 days. 03/17/18 04/05/18 Yes Barton Dubois, MD  rivaroxaban (XARELTO) 20 MG TABS tablet Take 1 tablet (20 mg total) by mouth daily with supper. 04/06/18  Yes Barton Dubois, MD  albuterol (PROVENTIL HFA;VENTOLIN HFA) 108 (90 Base) MCG/ACT inhaler Inhale 2 puffs into the lungs every 6 (six) hours as needed for wheezing or shortness of breath. 06/01/17   Raiford Noble Latif, DO  oseltamivir (TAMIFLU) 75 MG capsule Take 1 capsule (75 mg total) by mouth 2 (two) times daily. 03/22/18   Dorie Rank, MD  predniSONE (DELTASONE) 20 MG tablet Take 2 tablets by mouth daily for 5 days and stop prednisone. Patient not taking: Reported on 03/22/2018 03/17/18   Barton Dubois, MD    Family History Family History  Problem Relation Age of Onset  . Diabetes Other     Social History Social History   Tobacco Use  . Smoking status: Former Smoker    Packs/day: 1.00    Years: 41.00    Pack years: 41.00    Types: Cigarettes    Last attempt to quit: 04/03/2001    Years since quitting: 16.9  . Smokeless tobacco: Never Used  Substance Use Topics  . Alcohol use: No  . Drug use: No     Allergies   Patient has no known allergies.   Review of Systems Review of Systems  Unable to perform ROS: Dementia     Physical Exam Updated Vital Signs BP (!) 128/52 (BP Location: Left Arm)   Pulse 68   Temp (!) 102.2 F (39 C) (Rectal)   Resp 18   Ht 5\' 4"  (1.626 m)   Wt 77.1 kg   SpO2 98%   BMI 29.18 kg/m   Physical Exam Vitals signs and nursing note reviewed.  Constitutional:      Appearance: She is well-developed.  HENT:     Head: Normocephalic and atraumatic.     Right Ear: External ear normal.     Left Ear: External  ear normal.     Nose: Nose normal.  Eyes:     General:        Right eye: No discharge.        Left eye: No discharge.  Cardiovascular:     Rate and Rhythm: Normal rate and regular rhythm.     Heart sounds: Normal heart sounds.  Pulmonary:     Effort: Pulmonary effort is normal. No accessory muscle usage.     Breath sounds: Examination of the right-lower field reveals decreased breath sounds. Examination of the left-lower field reveals decreased breath sounds. Decreased breath sounds present.  Abdominal:     Palpations: Abdomen is  soft.     Tenderness: There is no abdominal tenderness.  Skin:    General: Skin is warm and dry.  Neurological:     Mental Status: She is alert.     Comments: Patient has eyes closed but easily opens to voice and follows commands.   Psychiatric:        Mood and Affect: Mood is not anxious.      ED Treatments / Results  Labs (all labs ordered are listed, but only abnormal results are displayed) Labs Reviewed  COMPREHENSIVE METABOLIC PANEL - Abnormal; Notable for the following components:      Result Value   Potassium 3.3 (*)    Chloride 97 (*)    Glucose, Bld 163 (*)    Calcium 8.1 (*)    Total Protein 5.7 (*)    Albumin 3.2 (*)    All other components within normal limits  CBC WITH DIFFERENTIAL/PLATELET - Abnormal; Notable for the following components:   Abs Immature Granulocytes 0.19 (*)    All other components within normal limits  BLOOD GAS, ARTERIAL - Abnormal; Notable for the following components:   pCO2 arterial 50.1 (*)    pO2, Arterial 65.8 (*)    Bicarbonate 32.4 (*)    Acid-Base Excess 9.5 (*)    All other components within normal limits  CG4 I-STAT (LACTIC ACID) - Abnormal; Notable for the following components:   Lactic Acid, Venous 2.38 (*)    All other components within normal limits  CULTURE, BLOOD (ROUTINE X 2)  CULTURE, BLOOD (ROUTINE X 2)  URINE CULTURE  URINALYSIS, ROUTINE W REFLEX MICROSCOPIC  BRAIN NATRIURETIC  PEPTIDE  I-STAT CG4 LACTIC ACID, ED  I-STAT CG4 LACTIC ACID, ED  CG4 I-STAT (LACTIC ACID)    EKG EKG Interpretation  Date/Time:  Saturday March 23 2018 10:13:11 EST Ventricular Rate:  83 PR Interval:    QRS Duration: 81 QT Interval:  355 QTC Calculation: 418 R Axis:   46 Text Interpretation:  Sinus rhythm no acute ST/T changes no significant change since yesterday. Confirmed by Sherwood Gambler [phone]) on 03/23/2018 10:26:19 AM   Radiology Dg Chest 2 View  Result Date: 03/23/2018 CLINICAL DATA:  Positive blood culture EXAM: CHEST - 2 VIEW COMPARISON:  03/22/2018 FINDINGS: Lower lung volumes with cardiomegaly and mild basilar stool edema pattern/atelectasis. Small effusions noted. Pattern compatible with mild CHF pattern. Upper lungs are clear. No pneumothorax. Aorta atherosclerotic. Trachea is midline. IMPRESSION: Mild cardiomegaly with basilar interstitial edema pattern and small effusions suggesting component of early CHF Lower lung volumes with basilar atelectasis Electronically Signed   By: Jerilynn Mages.  Shick M.D.   On: 03/23/2018 10:24   Dg Chest 2 View  Result Date: 03/22/2018 CLINICAL DATA:  Fever and altered mental status. EXAM: CHEST - 2 VIEW COMPARISON:  03/13/2018 FINDINGS: The heart size and mediastinal contours are within normal limits. Both lungs are clear. The visualized skeletal structures are unremarkable. IMPRESSION: No evidence for pneumonia. Electronically Signed   By: Kerby Moors M.D.   On: 03/22/2018 17:01    Procedures .Critical Care Performed by: Sherwood Gambler, MD Authorized by: Sherwood Gambler, MD   Critical care provider statement:    Critical care time (minutes):  30   Critical care was necessary to treat or prevent imminent or life-threatening deterioration of the following conditions:  Circulatory failure, respiratory failure and sepsis   Critical care was time spent personally by me on the following activities:  Development of treatment plan with  patient or surrogate,  discussions with consultants, evaluation of patient's response to treatment, examination of patient, obtaining history from patient or surrogate, ordering and performing treatments and interventions, ordering and review of laboratory studies, ordering and review of radiographic studies, re-evaluation of patient's condition, pulse oximetry and review of old charts   (including critical care time)  Medications Ordered in ED Medications  ceFEPIme (MAXIPIME) 2 g in sodium chloride 0.9 % 100 mL IVPB (0 g Intravenous Stopped 03/23/18 1131)  sodium chloride 0.9 % bolus 1,000 mL (1,000 mLs Intravenous New Bag/Given 03/23/18 1136)     Initial Impression / Assessment and Plan / ED Course  I have reviewed the triage vital signs and the nursing notes.  Pertinent labs & imaging results that were available during my care of the patient were reviewed by me and considered in my medical decision making (see chart for details).     Patient is febrile but not hypotensive.  She is a little sleepy but easily awakens.  ABG does not show any significant respiratory acidosis.  Given the blood culture results from yesterday, as well as concern for pneumonia with her cough, she was given IV cefepime to cover for hospital-acquired pneumonia.  MRSA screen previously was negative and with the blood cultures already coming back positive I do not think vancomycin is needed.  Given her COPD history this cefepime will also help cover Pseudomonas.  Given IV fluids.  She does not look fluid overloaded and I think her chest x-ray findings are more likely pneumonia than CHF.  She will be admitted to the hospitalist service.  Dr. Carles Collet to admit.  Final Clinical Impressions(s) / ED Diagnoses   Final diagnoses:  HCAP (healthcare-associated pneumonia)  Severe sepsis [facility])  Bacteremia    ED Discharge Orders    None       Sherwood Gambler, MD 03/23/18 1259

## 2018-03-23 NOTE — Progress Notes (Signed)
RN paged C. Bodenheimer, NP to make him aware that patient wears CPAP at night at home, family made RN aware.  RN inquires if this can be ordered, awaiting response.  P.J. Linus Mako, RN

## 2018-03-23 NOTE — ED Notes (Signed)
Pt placed on hospital bed at this time. Pt would like something to drink.

## 2018-03-23 NOTE — ED Notes (Signed)
Date and time results received: 03/23/18 1:50 PM  (use smartphrase ".now" to insert current time)  Test: Blood cultures Critical Value: positive gram negative rods (previous result called) but now specificity applied  Name of Provider Notified: Regenia Skeeter  Orders Received? Or Actions Taken?: Orders Received - See Orders for details

## 2018-03-23 NOTE — ED Triage Notes (Signed)
Pt was seen yesterday for fever, was called to return for positive blood culture, pt has continued to have fevers with highest being 102.3, last had Tylenol about 1 hr prior to arrival

## 2018-03-23 NOTE — Progress Notes (Signed)
Pharmacy Antibiotic Note  Paula Andrews is a 72 y.o. female admitted on 03/23/2018 with  bactermia.  Pharmacy has been consulted for cefepime dosing.  Plan: Cefepime 2gm IV q8h F/U cxs and clinical progress Monitor V/S and labs  Height: 5\' 4"  (162.6 cm) Weight: 170 lb (77.1 kg) IBW/kg (Calculated) : 54.7  Temp (24hrs), Avg:101 F (38.3 C), Min:98.5 F (36.9 C), Max:102.3 F (39.1 C)  Recent Labs  Lab 03/22/18 1454 03/22/18 1625 03/23/18 1035 03/23/18 1047 03/23/18 1235  WBC 13.7*  --  8.7  --   --   CREATININE 0.61  --  0.78  --   --   LATICACIDVEN  --  0.96  --  2.38* 1.40    Estimated Creatinine Clearance: 64.9 mL/min (by C-G formula based on SCr of 0.78 mg/dL).    No Known Allergies  Antimicrobials this admission: Cefepime 12/21>>   Dose adjustments this admission: N/A  Microbiology results: 12/20 BCx: 1 of 2 + GNR 12/21 BCx: pending  Thank you for allowing pharmacy to be a part of this patient's care.  Isac Sarna, BS Pharm D, BCPS Clinical Pharmacist Pager [phone] 03/23/2018 1:00 PM

## 2018-03-23 NOTE — Progress Notes (Signed)
RN paged C. Bodenheimer, NP to clarify VS orders, awaiting response. P.J. Linus Mako, RN

## 2018-03-23 NOTE — ED Notes (Signed)
Pt ambulated to bathroom with assist.  Sats upon returning were 80%.  Pt was not symptomatic and states she normally does not wear oxygen at all times.  Placed back on 2L with improvement.

## 2018-03-23 NOTE — ED Notes (Signed)
Pt given ice chips per EDP approval.

## 2018-03-23 NOTE — H&P (Signed)
History and Physical  Paula Andrews YJE:[id number] DOB: [birth year] DOA: 03/23/2018   PCP: Lajean Manes, MD   Patient coming from: Home  Chief Complaint: fever, bacteremia  HPI:  Paula Andrews is a 72 y.o. female with medical history of dementia, diabetes mellitus, COPD, hypertension, hypothyroidism, hyperlipidemia, B12 deficiency presenting with fevers and bacteremia.  The patient presented to the emergency department on 03/22/2018 with fevers and rigors and altered mental status.  Blood work including blood cultures and lactic acid were performed.  Urinalysis did not show any pyuria.  Chest x-ray was unremarkable at that time.  Because of concerns of influenza, the patient was discharged in stable condition with oseltamavir.  Overnight, the patient's blood cultures became positive.  As result, the patient was called back to come to the emergency department for further treatment.  The patient was recently admitted to the hospital from 03/13/2018 through 03/17/2018.  During that hospitalization, the patient was diagnosed with a pulmonary embolus and started on rivaroxaban.  She was discharged home on 2 L supplemental oxygen.  In the past 3 to 4 days, the patient's husband has noted that the patient has had increasing generalized weakness and some shortness of breath.  In addition, he has noted that she has been a bit more confused.  There is been no reports of headache, neck pain, chest pain, nausea, vomiting, diarrhea, or abdominal pain. In the emergency department, the patient was febrile up to 102.2 F.  However she was hemodynamically stable.  She was initially noted to be hypoxic with saturations of 83% which improved with supplemental oxygen.  WBC was 8.7.  BMP was unremarkable except for potassium 3.3.  LFTs were unremarkable.  Lactic acid was 2.3.  BNP was 60.  Chest x-ray showed increased interstitial markings with increased right lower lobe opacity.  The patient was started  cefepime.  Assessment/Plan: Sepsis -Present at time of admission -Secondary to gram-negative bacteremia -Continue cefepime -Lactic acid 2.38 -Check procalcitonin -Urinalysis negative for pyuria -Follow blood cultures -Continue IV fluids  Gram-negative bacteremia -Source is unclear -Continue cefepime pending culture data -Follow repeat blood cultures  Lobar pneumonia/HCAP -Personally reviewed chest x-ray--increased interstitial markings, increased right lower lobe opacity  Pulmonary embolism -03/14/2018 CT angiogram chest--right upper lobe, right lower lobe pulmonary embolus -Continue rivaroxaban  Essential hypertension -Holding amlodipine and lisinopril -Monitor clinically off antihypertensive therapy -Continue metoprolol tartrate  Diabetes mellitus type 2 -03/13/2018 hemoglobin A1c 5.9 -NovoLog sliding scale  Dementia with behavioral disturbance -Continue Aricept and Namenda -Continue Zyprexa  Hypothyroidism -Continue Synthroid  Hypokalemia -Replete -Check magnesium  COPD -Continue bronchodilators  Hyperlipidemia -Continue statin      Past Medical History:  Diagnosis Date  . Dementia ([place])   . Diabetes mellitus   . Hypertension   . Thyroid disease    Past Surgical History:  Procedure Laterality Date  . ABDOMINAL HYSTERECTOMY     Social History:  reports that she quit smoking about 16 years ago. Her smoking use included cigarettes. She has a 41.00 pack-year smoking history. She has never used smokeless tobacco. She reports that she does not drink alcohol or use drugs.   Family History  Problem Relation Age of Onset  . Diabetes Other      No Known Allergies   Prior to Admission medications   Medication Sig Start Date End Date Taking? Authorizing Provider  acetaminophen (TYLENOL) 325 MG tablet Take 650 mg by mouth 2 (two) times daily as needed for mild pain or  moderate pain.   Yes [provider]  amLODipine (NORVASC) 5 MG tablet  Take 1 tablet (5 mg total) by mouth daily. 05/19/17 08/07/18 Yes Johnson, Clanford L, MD  cyanocobalamin (,VITAMIN B-12,) 1000 MCG/ML injection Inject daily for 5 more days; then once a week for a month; then monthly. 03/17/18  Yes Barton Dubois, MD  donepezil (ARICEPT) 10 MG tablet Take 10 mg by mouth at bedtime.   Yes [provider]  Fluticasone-Salmeterol (ADVAIR DISKUS) 250-50 MCG/DOSE AEPB Inhale 1 puff into the lungs 2 (two) times daily. 03/17/18 06/15/18 Yes Barton Dubois, MD  levothyroxine (SYNTHROID, LEVOTHROID) 75 MCG tablet Take 75 mcg by mouth daily.    Yes [provider]  lisinopril (PRINIVIL,ZESTRIL) 5 MG tablet Take 5 mg by mouth daily.  05/26/17  Yes [provider]  memantine (NAMENDA) 10 MG tablet Take 10 mg by mouth 2 (two) times daily. 02/22/18  Yes [provider]  metFORMIN (GLUCOPHAGE) 500 MG tablet Take 500 mg by mouth daily as needed (for high blood sugars).    Yes [provider]  metoprolol tartrate (LOPRESSOR) 25 MG tablet Take 25 mg by mouth 2 (two) times daily. 01/24/18  Yes [provider]  OLANZapine (ZYPREXA) 2.5 MG tablet Take 1 tablet by mouth at bedtime. 01/10/18  Yes [provider]  pantoprazole (PROTONIX) 40 MG tablet Take 1 tablet (40 mg total) by mouth daily. 03/18/18  Yes Barton Dubois, MD  pravastatin (PRAVACHOL) 20 MG tablet Take 20 mg by mouth every morning.    Yes [provider]  Rivaroxaban (XARELTO) 15 MG TABS tablet Take 1 tablet (15 mg total) by mouth 2 (two) times daily with a meal for 19 days. 03/17/18 04/05/18 Yes Barton Dubois, MD  rivaroxaban (XARELTO) 20 MG TABS tablet Take 1 tablet (20 mg total) by mouth daily with supper. 04/06/18  Yes Barton Dubois, MD  albuterol (PROVENTIL HFA;VENTOLIN HFA) 108 (90 Base) MCG/ACT inhaler Inhale 2 puffs into the lungs every 6 (six) hours as needed for wheezing or shortness of breath. 06/01/17   Raiford Noble Latif, DO  oseltamivir (TAMIFLU) 75  MG capsule Take 1 capsule (75 mg total) by mouth 2 (two) times daily. 03/22/18   Dorie Rank, MD  predniSONE (DELTASONE) 20 MG tablet Take 2 tablets by mouth daily for 5 days and stop prednisone. Patient not taking: Reported on 03/22/2018 03/17/18   Barton Dubois, MD    Review of Systems:  Constitutional:  No weight loss, night sweats, Fevers, chills, fatigue.  Head&Eyes: No headache.  No vision loss.  No eye pain or scotoma ENT:  No Difficulty swallowing,Tooth/dental problems,Sore throat,  No ear ache, post nasal drip,  Cardio-vascular:  No chest pain, Orthopnea, PND, swelling in lower extremities,  dizziness, palpitations  GI:  No  abdominal pain, nausea, vomiting, diarrhea, loss of appetite, hematochezia, melena, heartburn, indigestion, Resp:   No cough. No coughing up of blood .No wheezing.No chest wall deformity  Skin:  no rash or lesions.  GU:  no dysuria, change in color of urine, no urgency or frequency. No flank pain.  Musculoskeletal:  No joint pain or swelling. No decreased range of motion. No back pain.  Psych:  No change in mood or affect. No depression or anxiety. Neurologic: No headache, no dysesthesia, no focal weakness, no vision loss. No syncope  Physical Exam: Vitals:   03/23/18 1130 03/23/18 1134 03/23/18 1200 03/23/18 1230  BP: (!) 128/52 (!) 128/52 (!) 128/54 (!) 128/52  Pulse: 76 72 63  68  Resp: (!) 21 14 20 18   Temp:  (!) 102.2 F (39 C)  (!) 102.2 F (39 C)  TempSrc:  Rectal  Rectal  SpO2: 95% 95% 93% 98%  Weight:      Height:       General:  A&O x 2, NAD, nontoxic, pleasant/cooperative Head/Eye: No conjunctival hemorrhage, no icterus, [place]/AT, No nystagmus ENT:  No icterus,  No thrush, good dentition, no pharyngeal exudate Neck:  No masses, no lymphadenpathy, no bruits CV:  RRR, no rub, no gallop, no S3 Lung: Bibasilar rales, right greater than left.  No wheezing. Abdomen: soft/NT, +BS, nondistended, no peritoneal signs Ext: No cyanosis, No  rashes, No petechiae, No lymphangitis, No edema Neuro: CNII-XII intact, strength 4/5 in bilateral upper and lower extremities, no dysmetria  Labs on Admission:  Basic Metabolic Panel: Recent Labs  Lab 03/22/18 1454 03/23/18 1035  NA 134* 135  K 3.5 3.3*  CL 95* 97*  CO2 31 30  GLUCOSE 161* 163*  BUN 20 22  CREATININE 0.61 0.78  CALCIUM 8.2* 8.1*   Liver Function Tests: Recent Labs  Lab 03/22/18 1454 03/23/18 1035  AST 13* 18  ALT 16 17  ALKPHOS 58 59  BILITOT 0.7 0.6  PROT 5.7* 5.7*  ALBUMIN 3.4* 3.2*   No results for input(s): LIPASE, AMYLASE in the last 168 hours. No results for input(s): AMMONIA in the last 168 hours. CBC: Recent Labs  Lab 03/22/18 1454 03/23/18 1035  WBC 13.7* 8.7  NEUTROABS  --  7.2  HGB 13.1 13.1  HCT 41.7 42.8  MCV 95.0 96.6  PLT 211 188   Coagulation Profile: No results for input(s): INR, PROTIME in the last 168 hours. Cardiac Enzymes: No results for input(s): CKTOTAL, CKMB, CKMBINDEX, TROPONINI in the last 168 hours. BNP: Invalid input(s): POCBNP CBG: Recent Labs  Lab 03/16/18 1632 03/17/18 0757 03/17/18 1810 03/22/18 1447  GLUCAP 177* 173* 289* 153*   Urine analysis:    Component Value Date/Time   COLORURINE YELLOW 03/23/2018 [place] 03/23/2018 0951   LABSPEC 1.015 03/23/2018 0951   PHURINE 7.0 03/23/2018 0951   GLUCOSEU NEGATIVE 03/23/2018 0951   HGBUR NEGATIVE 03/23/2018 0951   BILIRUBINUR NEGATIVE 03/23/2018 0951   KETONESUR NEGATIVE 03/23/2018 0951   PROTEINUR NEGATIVE 03/23/2018 0951   NITRITE NEGATIVE 03/23/2018 0951   LEUKOCYTESUR NEGATIVE 03/23/2018 0951   Sepsis Labs: @LABRCNTIP (procalcitonin:4,lacticidven:4) ) Recent Results (from the past 240 hour(s))  MRSA PCR Screening     Status: None   Collection Time: 03/14/18 12:33 PM  Result Value Ref Range Status   MRSA by PCR NEGATIVE NEGATIVE Final    Comment:        The GeneXpert MRSA Assay (FDA approved for NASAL specimens only), is  one component of a comprehensive MRSA colonization surveillance program. It is not intended to diagnose MRSA infection nor to guide or monitor treatment for MRSA infections. Performed at [facility], [address] [place], [place] [address]   Blood culture (routine x 2)     Status: None (Preliminary result)   Collection Time: 03/22/18  2:54 PM  Result Value Ref Range Status   Specimen Description BLOOD RIGHT ANTECUBITAL  Final   Special Requests   Final    BOTTLES DRAWN AEROBIC AND ANAEROBIC Blood Culture adequate volume   Culture   Final    NO GROWTH < 24 HOURS Performed at [facility], [address] [place], [place] [address]    Report Status PENDING  Incomplete  Blood culture (routine x 2)     Status: None (Preliminary result)   Collection Time: 03/22/18  3:58 PM  Result Value Ref Range Status   Specimen Description   Final    BLOOD BLOOD RIGHT WRIST Performed at [facility], [address], [place], [place] [address]    Special Requests   Final    BOTTLES DRAWN AEROBIC AND ANAEROBIC Blood Culture adequate volume Performed at [facility], [address], [place], [place] [address]    Culture  Setup Time   Final    GRAM NEGATIVE RODS IN BOTH AEROBIC AND ANAEROBIC BOTTLES Gram Stain Report Called to,Read Back By and Verified With: REED,C AT [facility] @ 0750 BY MATTHEWS, B 12.21.19 [facility] Organism ID to follow Performed at [facility], [place] [address], [place], [place] [address]    Culture   Final    NO GROWTH < 24 HOURS Performed at [facility], [address], [place], [place] [address]    Report Status PENDING  Incomplete  Blood Culture (routine x 2)     Status: None (Preliminary result)   Collection Time: 03/23/18 10:33 AM  Result Value Ref Range Status   Specimen Description RIGHT ANTECUBITAL  Final   Special Requests   Final    BOTTLES DRAWN AEROBIC AND ANAEROBIC Blood Culture adequate volume   Culture   Final    NO GROWTH <12 HOURS Performed at  [facility], [address] [place], [place] [address]    Report Status PENDING  Incomplete  Blood Culture (routine x 2)     Status: None (Preliminary result)   Collection Time: 03/23/18 10:43 AM  Result Value Ref Range Status   Specimen Description BLOOD LEFT HAND  Final   Special Requests   Final    BOTTLES DRAWN AEROBIC AND ANAEROBIC Blood Culture adequate volume Performed at [facility], [address], [place], [place] [address]    Culture PENDING  Incomplete   Report Status PENDING  Incomplete     Radiological Exams on Admission: Dg Chest 2 View  Result Date: 03/23/2018 CLINICAL DATA:  Positive blood culture EXAM: CHEST - 2 VIEW COMPARISON:  03/22/2018 FINDINGS: Lower lung volumes with cardiomegaly and mild basilar stool edema pattern/atelectasis. Small effusions noted. Pattern compatible with mild CHF pattern. Upper lungs are clear. No pneumothorax. Aorta atherosclerotic. Trachea is midline. IMPRESSION: Mild cardiomegaly with basilar interstitial edema pattern and small effusions suggesting component of early CHF Lower lung volumes with basilar atelectasis Electronically Signed   By: Jerilynn Mages.  Shick M.D.   On: 03/23/2018 10:24   Dg Chest 2 View  Result Date: 03/22/2018 CLINICAL DATA:  Fever and altered mental status. EXAM: CHEST - 2 VIEW COMPARISON:  03/13/2018 FINDINGS: The heart size and mediastinal contours are within normal limits. Both lungs are clear. The visualized skeletal structures are unremarkable. IMPRESSION: No evidence for pneumonia. Electronically Signed   By: Kerby Moors M.D.   On: 03/22/2018 17:01    EKG: Independently reviewed.  Sinus rhythm, nonspecific T wave change    Time spent:60 minutes Code Status:   DNR Family Communication:  Spouse update at bedside Disposition Plan: expect 2-3 day hospitalization Consults called: none DVT Prophylaxis:Xarelto  Orson Eva, DO  [facility] Pager [phone]  If 7PM-7AM, please contact  night-coverage [email or web address] Password [facility] 03/23/2018, 12:53 PM

## 2018-03-23 NOTE — ED Notes (Signed)
Pt states she would like something to eat. Dr. Carles Collet notified.

## 2018-03-23 NOTE — ED Notes (Signed)
CRITICAL VALUE ALERT  Critical Value:  Lactic 2.38  Date & Time Notied:  03/23/18, 1057  Provider Notified: Dr. Regenia Skeeter  Orders Received/Actions taken: EDP to reorder fluids based on increased lactic acid

## 2018-03-24 DIAGNOSIS — A4159 Other Gram-negative sepsis: Secondary | ICD-10-CM

## 2018-03-24 DIAGNOSIS — G9340 Encephalopathy, unspecified: Secondary | ICD-10-CM

## 2018-03-24 LAB — CBC
HCT: 39.4 % (ref 36.0–46.0)
HEMOGLOBIN: 11.9 g/dL — AB (ref 12.0–15.0)
MCH: 29.7 pg (ref 26.0–34.0)
MCHC: 30.2 g/dL (ref 30.0–36.0)
MCV: 98.3 fL (ref 80.0–100.0)
Platelets: 163 10*3/uL (ref 150–400)
RBC: 4.01 MIL/uL (ref 3.87–5.11)
RDW: 13.2 % (ref 11.5–15.5)
WBC: 7.4 10*3/uL (ref 4.0–10.5)
nRBC: 0 % (ref 0.0–0.2)

## 2018-03-24 LAB — GLUCOSE, CAPILLARY
GLUCOSE-CAPILLARY: 111 mg/dL — AB (ref 70–99)
Glucose-Capillary: 155 mg/dL — ABNORMAL HIGH (ref 70–99)
Glucose-Capillary: 204 mg/dL — ABNORMAL HIGH (ref 70–99)
Glucose-Capillary: 189 mg/dL — ABNORMAL HIGH (ref 70–99)

## 2018-03-24 LAB — BASIC METABOLIC PANEL
Anion gap: 7 (ref 5–15)
BUN: 15 mg/dL (ref 8–23)
CO2: 32 mmol/L (ref 22–32)
Calcium: 8.4 mg/dL — ABNORMAL LOW (ref 8.9–10.3)
Chloride: 102 mmol/L (ref 98–111)
Creatinine, Ser: 0.63 mg/dL (ref 0.44–1.00)
GFR calc Af Amer: >60 mL/min (ref >60)
GFR calc non Af Amer: >60 mL/min (ref >60)
Glucose, Bld: 133 mg/dL — ABNORMAL HIGH (ref 70–99)
POTASSIUM: 3.6 mmol/L (ref 3.5–5.1)
Sodium: 141 mmol/L (ref 135–145)

## 2018-03-24 LAB — LACTIC ACID, PLASMA: Lactic Acid, Venous: 0.7 mmol/L (ref 0.5–1.9)

## 2018-03-24 MED ORDER — IPRATROPIUM-ALBUTEROL 0.5-2.5 (3) MG/3ML IN SOLN
3.0000 mL | Freq: Three times a day (TID) | RESPIRATORY_TRACT | Status: DC
  Administered 2018-03-24 – 2018-03-25 (×5): 3 mL via RESPIRATORY_TRACT
  Filled 2018-03-24 (×5): qty 3

## 2018-03-24 MED ORDER — ORAL CARE MOUTH RINSE
15.0000 mL | Freq: Two times a day (BID) | OROMUCOSAL | Status: DC
  Administered 2018-03-24 – 2018-03-25 (×3): 15 mL via OROMUCOSAL

## 2018-03-24 MED ORDER — CHLORHEXIDINE GLUCONATE 0.12 % MT SOLN
15.0000 mL | Freq: Two times a day (BID) | OROMUCOSAL | Status: DC
  Administered 2018-03-24 – 2018-03-25 (×4): 15 mL via OROMUCOSAL
  Filled 2018-03-24 (×4): qty 15

## 2018-03-24 MED ORDER — MAGNESIUM SULFATE 2 GM/50ML IV SOLN
2.0000 g | Freq: Once | INTRAVENOUS | Status: AC
  Administered 2018-03-24: 2 g via INTRAVENOUS
  Filled 2018-03-24: qty 50

## 2018-03-24 NOTE — Progress Notes (Signed)
PROGRESS NOTE  ABIR EROH YNW:[id number] DOB: November 08, 1945 DOA: 03/23/2018 PCP: Lajean Manes, MD  Brief History:   72 y.o. female with medical history of dementia, diabetes mellitus, COPD, hypertension, hypothyroidism, hyperlipidemia, B12 deficiency presenting with fevers and bacteremia.  The patient presented to the emergency department on 03/22/2018 with fevers and rigors and altered mental status.  Blood work including blood cultures and lactic acid were performed.  Urinalysis did not show any pyuria.  Chest x-ray was unremarkable at that time.  Because of concerns of influenza, the patient was discharged in stable condition with oseltamavir.  Overnight, the patient's blood cultures became positive.  As result, the patient was called back to come to the emergency department for further treatment.  The patient was recently admitted to the hospital from 03/13/2018 through 03/17/2018.  During that hospitalization, the patient was diagnosed with a pulmonary embolus and started on rivaroxaban.  She was discharged home on 2 L supplemental oxygen.  In the past 3 to 4 days, the patient's husband has noted that the patient has had increasing generalized weakness and some shortness of breath. 72 y.o. female with medical history of dementia, diabetes mellitus, COPD, hypertension, hypothyroidism, hyperlipidemia, B12 deficiency presenting with fevers and bacteremia.  The patient presented to the emergency department on 03/22/2018 with fevers and rigors and altered mental status.  Blood work including blood cultures and lactic acid were performed.  Urinalysis did not show any pyuria.  Chest x-ray was unremarkable at that time.  Because of concerns of influenza, the patient was discharged in stable condition with oseltamavir.  Overnight, the patient's blood cultures became positive.  As result, the patient was called back to come to the emergency department for further treatment.  The patient was recently  admitted to the hospital from 03/13/2018 through 03/17/2018.  During that hospitalization, the patient was diagnosed with a pulmonary embolus and started on rivaroxaban.  She was discharged home on 2 L supplemental oxygen.  In the past 3 to 4 days, the patient's husband has noted that the patient has had increasing generalized weakness and some shortness of breath. In addition, he has noted that she has been a bit more confused.  In the emergency department, the patient was febrile up to 102.2 F.  However she was hemodynamically stable.  She was initially noted to be hypoxic with saturations of 83% which improved with supplemental oxygen.  WBC was 8.7.  BMP was unremarkable except for potassium 3.3.  LFTs were unremarkable.  Lactic acid was 2.3.  BNP was 60.  Chest x-ray showed increased interstitial markings with increased right lower lobe opacity.  The patient was started cefepime.  Assessment/Plan: Sepsis -Present at time of admission -Secondary to gram-negative bacteremia and pneumonia -Continue cefepime -Lactic acid 2.38>>>0.7 -Check procalcitonin--2.84 -Urinalysis negative for pyuria -Follow blood cultures -Continue IV fluids>>>saline lock  Enterobacter bacteremia -Source is unclear -Continue cefepime pending culture data -Follow repeat blood cultures  Lobar pneumonia/HCAP -Personally reviewed chest x-ray--increased interstitial markings, increased right lower lobe opacity -continue cefepime  Acute Metabolic Encephalopathy -due to sepsis -improved back to baseline  Pulmonary embolism -03/14/2018 CT angiogram chest--right upper lobe, right lower lobe pulmonary embolus -Continue rivaroxaban  Essential hypertension -Holding amlodipine and lisinopril -Continue metoprolol tartrate -BP remains well controlled  Diabetes mellitus type 2 -03/13/2018 hemoglobin A1c 5.9 -NovoLog sliding scale  Dementia with behavioral disturbance -Continue Aricept and Namenda -Continue  Zyprexa  Hypothyroidism -Continue Synthroid  Hypokalemia -Replete -Check magnesium--1.7  COPD -Continue bronchodilators  Hyperlipidemia -Continue statin    Disposition Plan:   Home 12/23 if stable Family Communication:   Spouse updated at bedside 12/22  Consultants:  none  Code Status:  FULL   DVT Prophylaxis:  Xarelto   Procedures: As Listed in Progress Note Above  Antibiotics: Cefepime 12/21>>>       Subjective: Patient denies fevers, chills, headache, chest pain, dyspnea, nausea, vomiting, diarrhea, abdominal pain, dysuria, hematuria, hematochezia, and melena.   Objective: Vitals:   03/24/18 0800 03/24/18 0836 03/24/18 1418 03/24/18 1446  BP: (!) 123/52   121/61  Pulse: 62   67  Resp: 18   18  Temp: 98.1 F (36.7 C)   98.8 F (37.1 C)  TempSrc: Oral   Oral  SpO2: 94% 99% (!) 84% 97%  Weight:      Height:        Intake/Output Summary (Last 24 hours) at 03/24/2018 1718 Last data filed at 03/24/2018 1504 Gross per 24 hour  Intake 2712.49 ml  Output -  Net 2712.49 ml   Weight change:  Exam:   General:  Pt is alert, follows commands appropriately, not in acute distress  HEENT: No icterus, No thrush, No neck mass, [place]/AT  Cardiovascular: RRR, S1/S2, no rubs, no gallops  Respiratory: bibasilar crackles R>L  Abdomen: Soft/+BS, non tender, non distended, no guarding  Extremities: No edema, No lymphangitis, No petechiae, No rashes, no synovitis   Data Reviewed: I have personally reviewed following labs and imaging studies Basic Metabolic Panel: Recent Labs  Lab 03/22/18 1454 03/23/18 1035 03/23/18 2055 03/24/18 0637  NA 134* 135 137 141  K 3.5 3.3* 3.5 3.6  CL 95* 97* 100 102  CO2 31 30 30  32  GLUCOSE 161* 163* 179* 133*  BUN 20 22 19 15   CREATININE 0.61 0.78 1.02* 0.63  CALCIUM 8.2* 8.1* 8.0* 8.4*  MG  --   --  1.7  --    Liver Function Tests: Recent Labs  Lab 03/22/18 1454 03/23/18 1035 03/23/18 2055  AST 13* 18  17  ALT 16 17 17   ALKPHOS 58 59 54  BILITOT 0.7 0.6 0.7  PROT 5.7* 5.7* 5.4*  ALBUMIN 3.4* 3.2* 3.0*   No results for input(s): LIPASE, AMYLASE in the last 168 hours. No results for input(s): AMMONIA in the last 168 hours. Coagulation Profile: Recent Labs  Lab 03/23/18 2055  INR 1.51   CBC: Recent Labs  Lab 03/22/18 1454 03/23/18 1035 03/24/18 0637  WBC 13.7* 8.7 7.4  NEUTROABS  --  7.2  --   HGB 13.1 13.1 11.9*  HCT 41.7 42.8 39.4  MCV 95.0 96.6 98.3  PLT 211 188 163   Cardiac Enzymes: No results for input(s): CKTOTAL, CKMB, CKMBINDEX, TROPONINI in the last 168 hours. BNP: Invalid input(s): POCBNP CBG: Recent Labs  Lab 03/22/18 1447 03/23/18 2123 03/24/18 0735 03/24/18 1123 03/24/18 1630  GLUCAP 153* 131* 111* 155* 204*   HbA1C: No results for input(s): HGBA1C in the last 72 hours. Urine analysis:    Component Value Date/Time   COLORURINE YELLOW 03/23/2018 [place] 03/23/2018 0951   LABSPEC 1.015 03/23/2018 0951   PHURINE 7.0 03/23/2018 0951   GLUCOSEU NEGATIVE 03/23/2018 0951   HGBUR NEGATIVE 03/23/2018 0951   BILIRUBINUR NEGATIVE 03/23/2018 0951   KETONESUR NEGATIVE 03/23/2018 0951   PROTEINUR NEGATIVE 03/23/2018 0951   NITRITE NEGATIVE 03/23/2018 0951   LEUKOCYTESUR NEGATIVE 03/23/2018 0951   Sepsis Labs: @LABRCNTIP (procalcitonin:4,lacticidven:4) ) Recent Results (from  the past 240 hour(s))  Blood culture (routine x 2)     Status: None (Preliminary result)   Collection Time: 03/22/18  2:54 PM  Result Value Ref Range Status   Specimen Description BLOOD RIGHT ANTECUBITAL  Final   Special Requests   Final    BOTTLES DRAWN AEROBIC AND ANAEROBIC Blood Culture adequate volume   Culture   Final    NO GROWTH 2 DAYS Performed at [facility], [address], [place], [place] [address]    Report Status PENDING  Incomplete  Blood culture (routine x 2)     Status: Abnormal (Preliminary result)   Collection Time: 03/22/18  3:58 PM    Result Value Ref Range Status   Specimen Description   Final    BLOOD BLOOD RIGHT WRIST Performed at [facility], [address] [place], [place] [address]    Special Requests   Final    BOTTLES DRAWN AEROBIC AND ANAEROBIC Blood Culture adequate volume Performed at [facility], [address], [place], [place] [address]    Culture  Setup Time   Final    GRAM NEGATIVE RODS IN BOTH AEROBIC AND ANAEROBIC BOTTLES Gram Stain Report Called to,Read Back By and Verified With: REED,C AT [facility] @ 0750 BY MATTHEWS, B 12.21.19 [facility] CRITICAL RESULT CALLED TO, READ BACK BY AND VERIFIED WITH: RN ED M CREWS 03/23/18 AT 1350 BY CM    Culture (A)  Final    ENTEROBACTER CLOACAE SUSCEPTIBILITIES TO FOLLOW Performed at [facility], [place] [address], [place], [place] [address]    Report Status PENDING  Incomplete  Blood Culture ID Panel (Reflexed)     Status: Abnormal   Collection Time: 03/22/18  3:58 PM  Result Value Ref Range Status   Enterococcus species NOT DETECTED NOT DETECTED Final   Listeria monocytogenes NOT DETECTED NOT DETECTED Final   Staphylococcus species NOT DETECTED NOT DETECTED Final   Staphylococcus aureus (BCID) NOT DETECTED NOT DETECTED Final   Streptococcus species NOT DETECTED NOT DETECTED Final   Streptococcus agalactiae NOT DETECTED NOT DETECTED Final   Streptococcus pneumoniae NOT DETECTED NOT DETECTED Final   Streptococcus pyogenes NOT DETECTED NOT DETECTED Final   Acinetobacter baumannii NOT DETECTED NOT DETECTED Final   Enterobacteriaceae species DETECTED (A) NOT DETECTED Final    Comment: Enterobacteriaceae represent a large family of gram-negative bacteria, not a single organism. CRITICAL RESULT CALLED TO, READ BACK BY AND VERIFIED WITH: RN ED M CREWS 03/23/18 AT 1350 BY CM    Enterobacter cloacae complex DETECTED (A) NOT DETECTED Final    Comment: CRITICAL RESULT CALLED TO, READ BACK BY AND VERIFIED WITH: RN ED M CREWS 03/23/18 AT 1350 BY CM     Escherichia coli NOT DETECTED NOT DETECTED Final   Klebsiella oxytoca NOT DETECTED NOT DETECTED Final   Klebsiella pneumoniae NOT DETECTED NOT DETECTED Final   Proteus species NOT DETECTED NOT DETECTED Final   Serratia marcescens NOT DETECTED NOT DETECTED Final   Carbapenem resistance NOT DETECTED NOT DETECTED Final   Haemophilus influenzae NOT DETECTED NOT DETECTED Final   Neisseria meningitidis NOT DETECTED NOT DETECTED Final   Pseudomonas aeruginosa NOT DETECTED NOT DETECTED Final   Candida albicans NOT DETECTED NOT DETECTED Final   Candida glabrata NOT DETECTED NOT DETECTED Final   Candida krusei NOT DETECTED NOT DETECTED Final   Candida parapsilosis NOT DETECTED NOT DETECTED Final   Candida tropicalis NOT DETECTED NOT DETECTED Final    Comment: Performed at [facility], [address] [place], [place] [address]  Blood Culture (routine x 2)     Status: None (Preliminary result)   Collection Time: 03/23/18 10:33 AM  Result Value Ref Range Status   Specimen Description RIGHT ANTECUBITAL  Final   Special Requests   Final    BOTTLES DRAWN AEROBIC AND ANAEROBIC Blood Culture adequate volume   Culture   Final    NO GROWTH < 24 HOURS Performed at [facility], [address] [place], [place] [address]    Report Status PENDING  Incomplete  Blood Culture (routine x 2)     Status: None (Preliminary result)   Collection Time: 03/23/18 10:43 AM  Result Value Ref Range Status   Specimen Description BLOOD LEFT HAND  Final   Special Requests   Final    BOTTLES DRAWN AEROBIC AND ANAEROBIC Blood Culture adequate volume   Culture   Final    NO GROWTH < 24 HOURS Performed at [facility], [address], [place], [place] [address]    Report Status PENDING  Incomplete  MRSA PCR Screening     Status: None   Collection Time: 03/23/18  1:26 PM  Result Value Ref Range Status   MRSA by PCR NEGATIVE NEGATIVE Final    Comment:        The GeneXpert MRSA Assay (FDA approved for NASAL  specimens only), is one component of a comprehensive MRSA colonization surveillance program. It is not intended to diagnose MRSA infection nor to guide or monitor treatment for MRSA infections. Performed at [facility], [address], [place], [place] [address]      Scheduled Meds: . chlorhexidine  15 mL Mouth Rinse BID  . donepezil  10 mg Oral QHS  . insulin aspart  0-9 Units Subcutaneous TID WC  . ipratropium-albuterol  3 mL Nebulization TID  . levothyroxine  75 mcg Oral Q0600  . mouth rinse  15 mL Mouth Rinse q12n4p  . memantine  10 mg Oral BID  . metoprolol tartrate  25 mg Oral BID  . OLANZapine  2.5 mg Oral QHS  . pantoprazole  40 mg Oral Daily  . pravastatin  20 mg Oral q1800  . Rivaroxaban  15 mg Oral BID WC   Continuous Infusions: . ceFEPime (MAXIPIME) IV Stopped (03/24/18 1217)    Procedures/Studies: Dg Chest 2 View  Result Date: 03/23/2018 CLINICAL DATA:  Positive blood culture EXAM: CHEST - 2 VIEW COMPARISON:  03/22/2018 FINDINGS: Lower lung volumes with cardiomegaly and mild basilar stool edema pattern/atelectasis. Small effusions noted. Pattern compatible with mild CHF pattern. Upper lungs are clear. No pneumothorax. Aorta atherosclerotic. Trachea is midline. IMPRESSION: Mild cardiomegaly with basilar interstitial edema pattern and small effusions suggesting component of early CHF Lower lung volumes with basilar atelectasis Electronically Signed   By: Jerilynn Mages.  Shick M.D.   On: 03/23/2018 10:24   Dg Chest 2 View  Result Date: 03/22/2018 CLINICAL DATA:  Fever and altered mental status. EXAM: CHEST - 2 VIEW COMPARISON:  03/13/2018 FINDINGS: The heart size and mediastinal contours are within normal limits. Both lungs are clear. The visualized skeletal structures are unremarkable. IMPRESSION: No evidence for pneumonia. Electronically Signed   By: Kerby Moors M.D.   On: 03/22/2018 17:01   Ct Head Wo Contrast  Result Date: 03/13/2018 CLINICAL DATA:  72 year old female  with history of syncopal event this morning. Incontinent of bowel and bladder. EXAM: CT HEAD WITHOUT CONTRAST TECHNIQUE: Contiguous axial images were obtained from the base of the skull  through the vertex without intravenous contrast. COMPARISON:  Head CT 01/20/2018. FINDINGS: Brain: Mild cerebral atrophy. Patchy and confluent areas of decreased attenuation are noted throughout the deep and periventricular white matter of the cerebral hemispheres bilaterally, compatible with chronic microvascular ischemic disease. More focal area of hypoattenuation in the white matter of the left occipital lobe, similar to the prior examination, compatible with encephalomalacia/gliosis from remote infarction. No evidence of acute infarction, hemorrhage, hydrocephalus, extra-axial collection or mass lesion/mass effect. Vascular: No hyperdense vessel or unexpected calcification. Skull: Normal. Negative for fracture or focal lesion. Sinuses/Orbits: No acute finding. Other: None. IMPRESSION: 1. No acute intracranial abnormalities. 2. 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 Electronically Signed   By: Vinnie Langton M.D.   On: 03/13/2018 15:54   Ct Angio Chest Pe W Or Wo Contrast  Result Date: 03/14/2018 CLINICAL DATA:  Hypoxia. Found on the floor. EXAM: CT ANGIOGRAPHY CHEST WITH CONTRAST TECHNIQUE: Multidetector CT imaging of the chest was performed using the standard protocol during bolus administration of intravenous contrast. Multiplanar CT image reconstructions and MIPs were obtained to evaluate the vascular anatomy. CONTRAST:  167mL ISOVUE-370 IOPAMIDOL (ISOVUE-370) INJECTION 76% COMPARISON:  Portable chest dated 03/13/2018. Chest CTA dated 05/29/2017 and 07/15/2011. FINDINGS: Cardiovascular: Small number of small and small to moderate-sized filling defects in right lower lobe and right upper lobe pulmonary arteries. Atheromatous  calcifications, including the coronary arteries and aorta. Mediastinum/Nodes: Mild diffuse esophageal wall thickening. No enlarged lymph nodes. Unremarkable thyroid gland. Lungs/Pleura: Mild diffuse peribronchial thickening. Mild diffuse bilateral bullous changes. Minimal bibasilar atelectasis. No pleural fluid. Upper Abdomen: No significant change in 3 small oval areas of low density in the liver. These have not changed significantly since 07/15/2011. Stable small calcified granuloma in the spleen. Musculoskeletal: Thoracic and lower cervical spine degenerative changes. Review of the MIP images confirms the above findings. IMPRESSION: 1. Small number of small and small to moderate-sized right lower lobe and right upper lobe pulmonary emboli. There is not enough clot burden to cause right heart strain. 2. Mild changes of COPD and chronic bronchitis. 3. Stable small liver cysts. 4. Mild diffuse esophageal wall thickening, most likely due to reflux esophagitis. Critical Value/emergent results were called by telephone at the time of interpretation on 03/14/2018 at 12:51 am to Dr. Elane Fritz, who verbally acknowledged these results. Aortic Atherosclerosis (ICD10-I70.0) and Emphysema (ICD10-J43.9). Electronically Signed   By: Claudie Revering M.D.   On: 03/14/2018 00:54   Dg Chest Port 1 View  Result Date: 03/13/2018 CLINICAL DATA:  Syncopal episode this morning, incontinence and hypoxia. EXAM: PORTABLE CHEST 1 VIEW COMPARISON:  Chest radiograph January 03, 2018 FINDINGS: Cardiac silhouette is upper limits of normal size. Calcified aortic arch. Mild chronic bronchitic changes. No pleural effusion or focal consolidation. No pneumothorax. Soft tissue planes and included osseous structures are nonacute. IMPRESSION: Borderline cardiomegaly. Mild chronic bronchitic changes without focal consolidation. Electronically Signed   By: Elon Alas M.D.   On: 03/13/2018 15:10    Orson Eva, DO  [facility] Pager  [phone]  If 7PM-7AM, please contact night-coverage [email or web address] Password [email or web address] 03/24/2018, 5:18 PM   LOS: 1 day

## 2018-03-25 DIAGNOSIS — J189 Pneumonia, unspecified organism: Secondary | ICD-10-CM

## 2018-03-25 LAB — URINE CULTURE: Culture: NO GROWTH

## 2018-03-25 LAB — GLUCOSE, CAPILLARY
Glucose-Capillary: 230 mg/dL — ABNORMAL HIGH (ref 70–99)
Glucose-Capillary: 295 mg/dL — ABNORMAL HIGH (ref 70–99)
Glucose-Capillary: 307 mg/dL — ABNORMAL HIGH (ref 70–99)
Glucose-Capillary: 132 mg/dL — ABNORMAL HIGH (ref 70–99)
Glucose-Capillary: 236 mg/dL — ABNORMAL HIGH (ref 70–99)

## 2018-03-25 LAB — BASIC METABOLIC PANEL
Anion gap: 5 (ref 5–15)
BUN: 16 mg/dL (ref 8–23)
CO2: 30 mmol/L (ref 22–32)
Calcium: 8.2 mg/dL — ABNORMAL LOW (ref 8.9–10.3)
Chloride: 104 mmol/L (ref 98–111)
Creatinine, Ser: 0.7 mg/dL (ref 0.44–1.00)
GFR calc Af Amer: >60 mL/min (ref >60)
GFR calc non Af Amer: >60 mL/min (ref >60)
Glucose, Bld: 162 mg/dL — ABNORMAL HIGH (ref 70–99)
Potassium: 4 mmol/L (ref 3.5–5.1)
Sodium: 139 mmol/L (ref 135–145)

## 2018-03-25 LAB — CULTURE, BLOOD (ROUTINE X 2): Special Requests: ADEQUATE

## 2018-03-25 LAB — MAGNESIUM: Magnesium: 2.1 mg/dL (ref 1.7–2.4)

## 2018-03-25 MED ORDER — LISINOPRIL 5 MG PO TABS: 5.0000 mg | ORAL_TABLET | Freq: Every day | ORAL | Status: DC

## 2018-03-25 MED ORDER — SULFAMETHOXAZOLE-TRIMETHOPRIM 800-160 MG PO TABS: 1.0000 | ORAL_TABLET | Freq: Two times a day (BID) | ORAL | 0 refills | Status: AC

## 2018-03-25 MED ORDER — FUROSEMIDE 10 MG/ML IJ SOLN
40.0000 mg | Freq: Once | INTRAMUSCULAR | Status: AC
  Administered 2018-03-25: 40 mg via INTRAVENOUS
  Filled 2018-03-25: qty 4

## 2018-03-25 MED ORDER — CEFPODOXIME PROXETIL 200 MG PO TABS: 200.0000 mg | ORAL_TABLET | Freq: Two times a day (BID) | ORAL | 0 refills | Status: AC

## 2018-03-25 MED ORDER — AMLODIPINE BESYLATE 5 MG PO TABS: 5.0000 mg | ORAL_TABLET | Freq: Every day | ORAL | 0 refills | Status: DC

## 2018-03-25 MED ORDER — SODIUM CHLORIDE 0.9 % IV SOLN
INTRAVENOUS | Status: DC | PRN
  Administered 2018-03-25: 500 mL via INTRAVENOUS

## 2018-03-25 NOTE — Discharge Summary (Signed)
Physician Discharge Summary  Paula Andrews JOA:[id number] DOB: 02-Jun-1945 DOA: 03/23/2018  PCP: Lajean Manes, MD  Admit date: 03/23/2018 Discharge date: 03/25/2018  Time spent: 60 minutes  Recommendations for Outpatient Follow-up:  1. Follow-up with Stoneking, Hal, MD in 1 to 2 weeks.  On follow-up patient's blood pressure need to be reassessed.  Patient also needs a basic metabolic profile done to follow-up on electrolytes and renal function.   Discharge Diagnoses:  Active Problems:   Chronic respiratory failure with hypoxia and hypercapnia (HCC)   Acute encephalopathy   Sepsis due to undetermined organism ([place])   Lobar pneumonia ([place])   Bacteremia due to Gram-negative bacteria   Gram negative sepsis ([place])   Enterobacter sepsis [facility])   Discharge Condition: Stable and improved  Diet recommendation: Heart healthy  Filed Weights   03/23/18 0955  Weight: 77.1 kg    History of present illness:  Per Dr. Hezzie Andrews is a 72 y.o. female with medical history of dementia, diabetes mellitus, COPD, hypertension, hypothyroidism, hyperlipidemia, B12 deficiency presented with fevers and bacteremia.  The patient presented to the emergency department on 03/22/2018 with fevers and rigors and altered mental status.  Blood work including blood cultures and lactic acid were performed.  Urinalysis did not show any pyuria.  Chest x-ray was unremarkable at that time.  Because of concerns of influenza, the patient was discharged in stable condition with oseltamavir.  Overnight, the patient's blood cultures became positive.  As result, the patient was called back to come to the emergency department for further treatment.  The patient was recently admitted to the hospital from 03/13/2018 through 03/17/2018.  During that hospitalization, the patient was diagnosed with a pulmonary embolus and started on rivaroxaban.  She was discharged home on 2 L supplemental oxygen.  In the past 3 to 4 days, the  patient's husband has noted that the patient has had increasing generalized weakness and some shortness of breath.  In addition, he has noted that she has been a bit more confused.  There had been no reports of headache, neck pain, chest pain, nausea, vomiting, diarrhea, or abdominal pain. In the emergency department, the patient was febrile up to 102.2 F.  However she was hemodynamically stable.  She was initially noted to be hypoxic with saturations of 83% which improved with supplemental oxygen.  WBC was 8.7.  BMP was unremarkable except for potassium 3.3.  LFTs were unremarkable.  Lactic acid was 2.3.  BNP was 60.  Chest x-ray showed increased interstitial markings with increased right lower lobe opacity.  The patient was started cefepime.  Hospital Course:  Sepsis Enterobacter clocae bacteremia and lobar pneumonia. -Patient had presented with fevers, rigors, altered mental status with sepsis physiology.  Patient was noted to have a temperature of 102.2 on presentation to the ED noted to be hypoxic with sats of 83% on room air.  Lactic acid was 2.3.  Chest x-ray which was done was concerning for interstitial markings with increased right lower lobe opacity concerning for an infiltrate.  Patient was admitted pancultured and placed empirically on IV cefepime.  Urinalysis which was done was negative for pyuria.  Blood cultures which were done prior to admission were positive and as such patient was called back to the ED.  Repeat blood cultures were obtained with no growth to date.  Patient improved clinically and will be transitioned to oral Bactrim DS twice daily as well as Vantin to cover for both bacteremia and lobar pneumonia on discharge.  Patient be discharged on 3 more days of antibiotics to complete a one-week course of antibiotic treatment.  Case was discussed with ID, Dr. Linus Salmons who was in agreement.  Outpatient follow-up with PCP.  Gram-negative bacteremia/Enterobacter Clocae -Source is unclear,  likely pulmonary source.  Patient was placed empirically on IV cefepime while patient was pancultured.  Blood cultures came back positive for Enterobacter clocae.  Patient remained afebrile.  Repeat blood cultures were done with no growth to date.  Patient improved clinically and patient be discharged home on 3 more days of Bactrim DS twice daily to complete a 7-day course of antibiotic treatment.  Case was discussed with ID Dr.Comer who was in agreement.  Outpatient follow-up with PCP.  Lobar pneumonia/HCAP -Patient had presented with sepsis physiology.  Patient was pancultured.  Chest x-ray done was concerning for right lobar pneumonia.  Patient was placed empirically on IV cefepime as well as bronchodilators and monitored.  Patient improved clinically.  Patient be discharged home on 3 more days of oral Vantin to complete a one-week course of antibiotic treatment.  Outpatient follow-up with PCP.   Pulmonary embolism -03/14/2018 CT angiogram chest--right upper lobe, right lower lobe pulmonary embolus -Continued on rivaroxaban during the hospitalization.  Outpatient follow-up with PCP.  Essential hypertension -Patient's Norvasc and lisinopril were held during the hospitalization and patient maintained on home regimen of metoprolol.  Blood pressure remained stable.  On discharge patient's Norvasc and lisinopril will be resumed in 3 to 4 days.  Outpatient follow-up with PCP.   Diabetes mellitus type 2 -03/13/2018 hemoglobin A1c 5.9 -Patient was placed on a sliding scale insulin and patient's oral hypoglycemic agents held.  Oral hypoglycemic agents will be resumed on discharge.    Dementia with behavioral disturbance -Patient maintained on home regimen of Aricept and Namenda and Zyprexa.  Outpatient follow-up.  Hypothyroidism -TSH was checked during the prior hospitalization which was within normal limits at 0.845 on 03/13/2018.  Patient maintained on home regimen Synthroid.  Outpatient  follow-up with PCP.   Hypokalemia -Potassium was repleted.  Magnesium was also repleted.  Outpatient follow-up.  COPD -Patient placed on bronchodilators during the hospitalization.   Hyperlipidemia -Patient maintained on home regimen of statin.     Procedures:  Chest x-ray 03/22/2018, 03/23/2018    Consultations:  None  Discharge Exam: Vitals:   03/25/18 1357 03/25/18 1357  BP: (!) 136/54 (!) 134/56  Pulse: 63 65  Resp: 16 16  Temp: 98.3 F (36.8 C) 98.3 F (36.8 C)  SpO2: 93% 92%    General: NAD Cardiovascular: RRR Respiratory: Coarse breath sounds in the right base.  Discharge Instructions   Discharge Instructions    Diet - low sodium heart healthy   Complete by:  As directed    Increase activity slowly   Complete by:  As directed      Allergies as of 03/25/2018   No Known Allergies     Medication List    STOP taking these medications   oseltamivir 75 MG capsule Commonly known as:  TAMIFLU   predniSONE 20 MG tablet Commonly known as:  DELTASONE     TAKE these medications   acetaminophen 325 MG tablet Commonly known as:  TYLENOL Take 650 mg by mouth 2 (two) times daily as needed for mild pain or moderate pain.   albuterol 108 (90 Base) MCG/ACT inhaler Commonly known as:  PROVENTIL HFA;VENTOLIN HFA Inhale 2 puffs into the lungs every 6 (six) hours as needed for wheezing or shortness of breath.  amLODipine 5 MG tablet Commonly known as:  NORVASC Take 1 tablet (5 mg total) by mouth daily. Start taking on:  March 29, 2018 What changed:  These instructions start on March 29, 2018. If you are unsure what to do until then, ask your doctor or other care provider.   cefpodoxime 200 MG tablet Commonly known as:  VANTIN Take 1 tablet (200 mg total) by mouth 2 (two) times daily for 3 days. Start taking on:  March 26, 2018   cyanocobalamin 1000 MCG/ML injection Commonly known as:  (VITAMIN B-12) Inject daily for 5 more days; then  once a week for a month; then monthly.   donepezil 10 MG tablet Commonly known as:  ARICEPT Take 10 mg by mouth at bedtime.   Fluticasone-Salmeterol 250-50 MCG/DOSE Aepb Commonly known as:  ADVAIR DISKUS Inhale 1 puff into the lungs 2 (two) times daily.   levothyroxine 75 MCG tablet Commonly known as:  SYNTHROID, LEVOTHROID Take 75 mcg by mouth daily.   lisinopril 5 MG tablet Commonly known as:  PRINIVIL,ZESTRIL Take 1 tablet (5 mg total) by mouth daily. Start taking on:  March 30, 2018 What changed:  These instructions start on March 30, 2018. If you are unsure what to do until then, ask your doctor or other care provider.   memantine 10 MG tablet Commonly known as:  NAMENDA Take 10 mg by mouth 2 (two) times daily.   metFORMIN 500 MG tablet Commonly known as:  GLUCOPHAGE Take 500 mg by mouth daily as needed (for high blood sugars).   metoprolol tartrate 25 MG tablet Commonly known as:  LOPRESSOR Take 25 mg by mouth 2 (two) times daily.   OLANZapine 2.5 MG tablet Commonly known as:  ZYPREXA Take 1 tablet by mouth at bedtime.   pantoprazole 40 MG tablet Commonly known as:  PROTONIX Take 1 tablet (40 mg total) by mouth daily.   pravastatin 20 MG tablet Commonly known as:  PRAVACHOL Take 20 mg by mouth every morning.   Rivaroxaban 15 MG Tabs tablet Commonly known as:  XARELTO Take 1 tablet (15 mg total) by mouth 2 (two) times daily with a meal for 19 days.   rivaroxaban 20 MG Tabs tablet Commonly known as:  XARELTO Take 1 tablet (20 mg total) by mouth daily with supper. Start taking on:  April 06, 2018   sulfamethoxazole-trimethoprim 800-160 MG tablet Commonly known as:  BACTRIM DS Take 1 tablet by mouth 2 (two) times daily for 3 days. Start taking on:  March 26, 2018      No Known Allergies Follow-up Information    Stoneking, Hal, MD. Schedule an appointment as soon as possible for a visit in 1 week(s).   Specialty:  Internal Medicine Why:   Follow-up in 1 to 2 weeks. Contact information: [address] [place] [address] [phone]            The results of significant diagnostics from this hospitalization (including imaging, microbiology, ancillary and laboratory) are listed below for reference.    Significant Diagnostic Studies: Dg Chest 2 View  Result Date: 03/23/2018 CLINICAL DATA:  Positive blood culture EXAM: CHEST - 2 VIEW COMPARISON:  03/22/2018 FINDINGS: Lower lung volumes with cardiomegaly and mild basilar stool edema pattern/atelectasis. Small effusions noted. Pattern compatible with mild CHF pattern. Upper lungs are clear. No pneumothorax. Aorta atherosclerotic. Trachea is midline. IMPRESSION: Mild cardiomegaly with basilar interstitial edema pattern and small effusions suggesting component of early CHF Lower lung volumes with  basilar atelectasis Electronically Signed   By: Jerilynn Mages.  Shick M.D.   On: 03/23/2018 10:24   Dg Chest 2 View  Result Date: 03/22/2018 CLINICAL DATA:  Fever and altered mental status. EXAM: CHEST - 2 VIEW COMPARISON:  03/13/2018 FINDINGS: The heart size and mediastinal contours are within normal limits. Both lungs are clear. The visualized skeletal structures are unremarkable. IMPRESSION: No evidence for pneumonia. Electronically Signed   By: Kerby Moors M.D.   On: 03/22/2018 17:01   Ct Head Wo Contrast  Result Date: 03/13/2018 CLINICAL DATA:  72 year old female with history of syncopal event this morning. Incontinent of bowel and bladder. EXAM: CT HEAD WITHOUT CONTRAST TECHNIQUE: Contiguous axial images were obtained from the base of the skull through the vertex without intravenous contrast. COMPARISON:  Head CT 01/20/2018. FINDINGS: Brain: Mild cerebral atrophy. Patchy and confluent areas of decreased attenuation are noted throughout the deep and periventricular white matter of the cerebral hemispheres bilaterally, compatible with chronic microvascular ischemic disease.  More focal area of hypoattenuation in the white matter of the left occipital lobe, similar to the prior examination, compatible with encephalomalacia/gliosis from remote infarction. No evidence of acute infarction, hemorrhage, hydrocephalus, extra-axial collection or mass lesion/mass effect. Vascular: No hyperdense vessel or unexpected calcification. Skull: Normal. Negative for fracture or focal lesion. Sinuses/Orbits: No acute finding. Other: None. IMPRESSION: 1. No acute intracranial abnormalities. 2. 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 Electronically Signed   By: Vinnie Langton M.D.   On: 03/13/2018 15:54   Ct Angio Chest Pe W Or Wo Contrast  Result Date: 03/14/2018 CLINICAL DATA:  Hypoxia. Found on the floor. EXAM: CT ANGIOGRAPHY CHEST WITH CONTRAST TECHNIQUE: Multidetector CT imaging of the chest was performed using the standard protocol during bolus administration of intravenous contrast. Multiplanar CT image reconstructions and MIPs were obtained to evaluate the vascular anatomy. CONTRAST:  180mL ISOVUE-370 IOPAMIDOL (ISOVUE-370) INJECTION 76% COMPARISON:  Portable chest dated 03/13/2018. Chest CTA dated 05/29/2017 and 07/15/2011. FINDINGS: Cardiovascular: Small number of small and small to moderate-sized filling defects in right lower lobe and right upper lobe pulmonary arteries. Atheromatous calcifications, including the coronary arteries and aorta. Mediastinum/Nodes: Mild diffuse esophageal wall thickening. No enlarged lymph nodes. Unremarkable thyroid gland. Lungs/Pleura: Mild diffuse peribronchial thickening. Mild diffuse bilateral bullous changes. Minimal bibasilar atelectasis. No pleural fluid. Upper Abdomen: No significant change in 3 small oval areas of low density in the liver. These have not changed significantly since 07/15/2011. Stable small calcified granuloma in the spleen.  Musculoskeletal: Thoracic and lower cervical spine degenerative changes. Review of the MIP images confirms the above findings. IMPRESSION: 1. Small number of small and small to moderate-sized right lower lobe and right upper lobe pulmonary emboli. There is not enough clot burden to cause right heart strain. 2. Mild changes of COPD and chronic bronchitis. 3. Stable small liver cysts. 4. Mild diffuse esophageal wall thickening, most likely due to reflux esophagitis. Critical Value/emergent results were called by telephone at the time of interpretation on 03/14/2018 at 12:51 am to Dr. Elane Fritz, who verbally acknowledged these results. Aortic Atherosclerosis (ICD10-I70.0) and Emphysema (ICD10-J43.9). Electronically Signed   By: Claudie Revering M.D.   On: 03/14/2018 00:54   Dg Chest Port 1 View  Result Date: 03/13/2018 CLINICAL DATA:  Syncopal episode this morning, incontinence and hypoxia. EXAM: PORTABLE CHEST 1 VIEW COMPARISON:  Chest radiograph January 03, 2018 FINDINGS: Cardiac silhouette is upper limits of normal size. Calcified  aortic arch. Mild chronic bronchitic changes. No pleural effusion or focal consolidation. No pneumothorax. Soft tissue planes and included osseous structures are nonacute. IMPRESSION: Borderline cardiomegaly. Mild chronic bronchitic changes without focal consolidation. Electronically Signed   By: Elon Alas M.D.   On: 03/13/2018 15:10    Microbiology: Recent Results (from the past 240 hour(s))  Blood culture (routine x 2)     Status: None (Preliminary result)   Collection Time: 03/22/18  2:54 PM  Result Value Ref Range Status   Specimen Description BLOOD RIGHT ANTECUBITAL  Final   Special Requests   Final    BOTTLES DRAWN AEROBIC AND ANAEROBIC Blood Culture adequate volume   Culture   Final    NO GROWTH 3 DAYS Performed at [facility], [address] [place], [place] [address]    Report Status PENDING  Incomplete  Blood culture (routine x 2)     Status: Abnormal    Collection Time: 03/22/18  3:58 PM  Result Value Ref Range Status   Specimen Description   Final    BLOOD BLOOD RIGHT WRIST Performed at [facility], [address] [place], [place] [address]    Special Requests   Final    BOTTLES DRAWN AEROBIC AND ANAEROBIC Blood Culture adequate volume Performed at [facility], [address], [place], [place] [address]    Culture  Setup Time   Final    GRAM NEGATIVE RODS IN BOTH AEROBIC AND ANAEROBIC BOTTLES Gram Stain Report Called to,Read Back By and Verified With: REED,C AT [facility] @ 0750 BY MATTHEWS, B 12.21.19 [facility] CRITICAL RESULT CALLED TO, READ BACK BY AND VERIFIED WITH: RN ED M CREWS 03/23/18 AT 1350 BY CM Performed at [facility], [place] [address] [place], [place] [address]    Culture ENTEROBACTER CLOACAE (A)  Final   Report Status 03/25/2018 FINAL  Final   Organism ID, Bacteria ENTEROBACTER CLOACAE  Final      Susceptibility   Enterobacter cloacae - MIC*    CEFAZOLIN >=64 RESISTANT Resistant     CEFEPIME <=1 SENSITIVE Sensitive     CEFTAZIDIME <=1 SENSITIVE Sensitive     CEFTRIAXONE <=1 SENSITIVE Sensitive     CIPROFLOXACIN <=0.25 SENSITIVE Sensitive     GENTAMICIN <=1 SENSITIVE Sensitive     IMIPENEM 0.5 SENSITIVE Sensitive     TRIMETH/SULFA <=20 SENSITIVE Sensitive     PIP/TAZO <=4 SENSITIVE Sensitive     * ENTEROBACTER CLOACAE  Blood Culture ID Panel (Reflexed)     Status: Abnormal   Collection Time: 03/22/18  3:58 PM  Result Value Ref Range Status   Enterococcus species NOT DETECTED NOT DETECTED Final   Listeria monocytogenes NOT DETECTED NOT DETECTED Final   Staphylococcus species NOT DETECTED NOT DETECTED Final   Staphylococcus aureus (BCID) NOT DETECTED NOT DETECTED Final   Streptococcus species NOT DETECTED NOT DETECTED Final   Streptococcus agalactiae NOT DETECTED NOT DETECTED Final   Streptococcus pneumoniae NOT DETECTED NOT DETECTED Final   Streptococcus pyogenes NOT DETECTED NOT DETECTED Final    Acinetobacter baumannii NOT DETECTED NOT DETECTED Final   Enterobacteriaceae species DETECTED (A) NOT DETECTED Final    Comment: Enterobacteriaceae represent a large family of gram-negative bacteria, not a single organism. CRITICAL RESULT CALLED TO, READ BACK BY AND VERIFIED WITH: RN ED M CREWS 03/23/18 AT 1350 BY CM    Enterobacter cloacae complex DETECTED (A) NOT DETECTED Final    Comment: CRITICAL RESULT CALLED TO, READ BACK BY AND VERIFIED WITH: RN  ED M CREWS 03/23/18 AT 1350 BY CM    Escherichia coli NOT DETECTED NOT DETECTED Final   Klebsiella oxytoca NOT DETECTED NOT DETECTED Final   Klebsiella pneumoniae NOT DETECTED NOT DETECTED Final   Proteus species NOT DETECTED NOT DETECTED Final   Serratia marcescens NOT DETECTED NOT DETECTED Final   Carbapenem resistance NOT DETECTED NOT DETECTED Final   Haemophilus influenzae NOT DETECTED NOT DETECTED Final   Neisseria meningitidis NOT DETECTED NOT DETECTED Final   Pseudomonas aeruginosa NOT DETECTED NOT DETECTED Final   Candida albicans NOT DETECTED NOT DETECTED Final   Candida glabrata NOT DETECTED NOT DETECTED Final   Candida krusei NOT DETECTED NOT DETECTED Final   Candida parapsilosis NOT DETECTED NOT DETECTED Final   Candida tropicalis NOT DETECTED NOT DETECTED Final    Comment: Performed at [facility], [place] [address], [place], [place] [address]  Urine culture     Status: None   Collection Time: 03/23/18  9:51 AM  Result Value Ref Range Status   Specimen Description   Final    URINE, RANDOM Performed at [facility], [address], [place], [place] [address]    Special Requests   Final    NONE Performed at [facility], [address], [place], [place] [address]    Culture   Final    NO GROWTH Performed at [facility], [place] [address], [place], [place] [address]    Report Status 03/25/2018 FINAL  Final  Blood Culture (routine x 2)     Status: None (Preliminary result)   Collection Time: 03/23/18 10:33 AM   Result Value Ref Range Status   Specimen Description RIGHT ANTECUBITAL  Final   Special Requests   Final    BOTTLES DRAWN AEROBIC AND ANAEROBIC Blood Culture adequate volume   Culture  Setup Time   Final    GRAM NEGATIVE RODS Gram Stain Report Called to,Read Back By and Verified With: BROWN,S. AT 2313 ON 03/24/2018 BY EVA AEROBIC BOTTLE ONLY Performed at [place] CALLED TO, READ BACK BY AND VERIFIED WITH: Bevely Palmer [id number] 03/25/2018 T. TYSOR    Culture   Final    NO GROWTH 2 DAYS Performed at [facility], [address] [place], [place] [address]    Report Status PENDING  Incomplete  Blood Culture (routine x 2)     Status: None (Preliminary result)   Collection Time: 03/23/18 10:43 AM  Result Value Ref Range Status   Specimen Description BLOOD LEFT HAND  Final   Special Requests   Final    BOTTLES DRAWN AEROBIC AND ANAEROBIC Blood Culture adequate volume   Culture   Final    NO GROWTH 2 DAYS Performed at [facility], [address] [place], [place] [address]    Report Status PENDING  Incomplete  MRSA PCR Screening     Status: None   Collection Time: 03/23/18  1:26 PM  Result Value Ref Range Status   MRSA by PCR NEGATIVE NEGATIVE Final    Comment:        The GeneXpert MRSA Assay (FDA approved for NASAL specimens only), is one component of a comprehensive MRSA colonization surveillance program. It is not intended to diagnose MRSA infection nor to guide or monitor treatment for MRSA infections. Performed at [facility], [address], [place], [place] [address]      Labs: Basic Metabolic Panel: Recent Labs  Lab 03/22/18 1454 03/23/18  1035 03/23/18 2055 03/24/18 0637 03/25/18 0509  NA 134* 135 137 141 139  K 3.5 3.3* 3.5 3.6 4.0  CL 95* 97* 100 102 104  CO2 31 30 30  32 30  GLUCOSE 161* 163* 179* 133* 162*  BUN 20 22 19 15 16   CREATININE 0.61 0.78 1.02* 0.63 0.70  CALCIUM 8.2* 8.1*  8.0* 8.4* 8.2*  MG  --   --  1.7  --  2.1   Liver Function Tests: Recent Labs  Lab 03/22/18 1454 03/23/18 1035 03/23/18 2055  AST 13* 18 17  ALT 16 17 17   ALKPHOS 58 59 54  BILITOT 0.7 0.6 0.7  PROT 5.7* 5.7* 5.4*  ALBUMIN 3.4* 3.2* 3.0*   No results for input(s): LIPASE, AMYLASE in the last 168 hours. No results for input(s): AMMONIA in the last 168 hours. CBC: Recent Labs  Lab 03/22/18 1454 03/23/18 1035 03/24/18 0637  WBC 13.7* 8.7 7.4  NEUTROABS  --  7.2  --   HGB 13.1 13.1 11.9*  HCT 41.7 42.8 39.4  MCV 95.0 96.6 98.3  PLT 211 188 163   Cardiac Enzymes: No results for input(s): CKTOTAL, CKMB, CKMBINDEX, TROPONINI in the last 168 hours. BNP: BNP (last 3 results) Recent Labs    05/31/17 1648 03/23/18 1035  BNP 41.4 60.0    ProBNP (last 3 results) No results for input(s): PROBNP in the last 8760 hours.  CBG: Recent Labs  Lab 03/24/18 1123 03/24/18 1630 03/24/18 2118 03/25/18 0756 03/25/18 1120  GLUCAP 155* 204* 189* 132* 236*       Signed:  Irine Seal MD.  [facility] 03/25/2018, 3:59 PM

## 2018-03-25 NOTE — Care Management Important Message (Signed)
Important Message  Patient Details  Name: Paula Andrews MRN: [id number] Date of Birth: Oct 11, 1945   Medicare Important Message Given:  Yes    Shelda Altes 03/25/2018, 12:07 PM

## 2018-03-25 NOTE — Care Management (Addendum)
Recently DC'd. Pt from home, daughter was staying with her since last DC. Plans for daughter to continue to stay with her at night after DC. Pt was referred to [facility] for [facility] last DC, first visit was scheduled but had not been made prior to coming back in. CM has notified [facility] rep of admission. Pt will need orders to resume [place] services at DC. Will cont to follow. Granddaughter at bedside for DC discussion.

## 2018-03-25 NOTE — Progress Notes (Signed)
Reviewed discharge information with patient's daughter and family and provided print out. Removed IV and pt escorted in wheelchair by nurse tech.

## 2018-03-25 NOTE — Progress Notes (Signed)
Patient Saturations on Room Air at Rest = 95  Patient Saturations on [employer] while Ambulating = 93

## 2018-03-26 ENCOUNTER — Telehealth: Payer: Self-pay

## 2018-03-26 NOTE — Telephone Encounter (Signed)
+   [facility] ED 03/22/18  Currently adm

## 2018-03-27 LAB — CULTURE, BLOOD (ROUTINE X 2)
Culture: NO GROWTH
Special Requests: ADEQUATE
Special Requests: ADEQUATE

## 2018-03-28 DIAGNOSIS — E039 Hypothyroidism, unspecified: Secondary | ICD-10-CM | POA: Diagnosis not present

## 2018-03-28 DIAGNOSIS — I10 Essential (primary) hypertension: Secondary | ICD-10-CM | POA: Diagnosis not present

## 2018-03-28 DIAGNOSIS — I2699 Other pulmonary embolism without acute cor pulmonale: Secondary | ICD-10-CM | POA: Diagnosis not present

## 2018-03-28 DIAGNOSIS — J9600 Acute respiratory failure, unspecified whether with hypoxia or hypercapnia: Secondary | ICD-10-CM | POA: Diagnosis not present

## 2018-03-28 DIAGNOSIS — E782 Mixed hyperlipidemia: Secondary | ICD-10-CM | POA: Diagnosis not present

## 2018-03-28 DIAGNOSIS — E119 Type 2 diabetes mellitus without complications: Secondary | ICD-10-CM | POA: Diagnosis not present

## 2018-03-28 DIAGNOSIS — E871 Hypo-osmolality and hyponatremia: Secondary | ICD-10-CM | POA: Diagnosis not present

## 2018-03-28 DIAGNOSIS — F0390 Unspecified dementia without behavioral disturbance: Secondary | ICD-10-CM | POA: Diagnosis not present

## 2018-03-28 DIAGNOSIS — G4733 Obstructive sleep apnea (adult) (pediatric): Secondary | ICD-10-CM | POA: Diagnosis not present

## 2018-03-28 DIAGNOSIS — J441 Chronic obstructive pulmonary disease with (acute) exacerbation: Secondary | ICD-10-CM | POA: Diagnosis not present

## 2018-03-28 DIAGNOSIS — G3184 Mild cognitive impairment, so stated: Secondary | ICD-10-CM | POA: Diagnosis not present

## 2018-03-28 DIAGNOSIS — E559 Vitamin D deficiency, unspecified: Secondary | ICD-10-CM | POA: Diagnosis not present

## 2018-03-28 DIAGNOSIS — F0391 Unspecified dementia with behavioral disturbance: Secondary | ICD-10-CM | POA: Diagnosis not present

## 2018-03-28 DIAGNOSIS — E1169 Type 2 diabetes mellitus with other specified complication: Secondary | ICD-10-CM | POA: Diagnosis not present

## 2018-03-28 LAB — CULTURE, BLOOD (ROUTINE X 2)
Culture: NO GROWTH
Special Requests: ADEQUATE

## 2018-03-29 DIAGNOSIS — E039 Hypothyroidism, unspecified: Secondary | ICD-10-CM | POA: Diagnosis not present

## 2018-03-29 DIAGNOSIS — E538 Deficiency of other specified B group vitamins: Secondary | ICD-10-CM | POA: Diagnosis not present

## 2018-03-29 DIAGNOSIS — J441 Chronic obstructive pulmonary disease with (acute) exacerbation: Secondary | ICD-10-CM | POA: Diagnosis not present

## 2018-03-29 DIAGNOSIS — J189 Pneumonia, unspecified organism: Secondary | ICD-10-CM | POA: Diagnosis not present

## 2018-03-29 DIAGNOSIS — J440 Chronic obstructive pulmonary disease with acute lower respiratory infection: Secondary | ICD-10-CM | POA: Diagnosis not present

## 2018-03-29 DIAGNOSIS — I2699 Other pulmonary embolism without acute cor pulmonale: Secondary | ICD-10-CM | POA: Diagnosis not present

## 2018-03-29 DIAGNOSIS — J9622 Acute and chronic respiratory failure with hypercapnia: Secondary | ICD-10-CM | POA: Diagnosis not present

## 2018-03-29 DIAGNOSIS — F0281 Dementia in other diseases classified elsewhere with behavioral disturbance: Secondary | ICD-10-CM | POA: Diagnosis not present

## 2018-03-29 DIAGNOSIS — I10 Essential (primary) hypertension: Secondary | ICD-10-CM | POA: Diagnosis not present

## 2018-03-29 DIAGNOSIS — E119 Type 2 diabetes mellitus without complications: Secondary | ICD-10-CM | POA: Diagnosis not present

## 2018-03-29 DIAGNOSIS — J9621 Acute and chronic respiratory failure with hypoxia: Secondary | ICD-10-CM | POA: Diagnosis not present

## 2018-03-29 DIAGNOSIS — R001 Bradycardia, unspecified: Secondary | ICD-10-CM | POA: Diagnosis not present

## 2018-04-02 DIAGNOSIS — I2699 Other pulmonary embolism without acute cor pulmonale: Secondary | ICD-10-CM | POA: Diagnosis not present

## 2018-04-02 DIAGNOSIS — J9622 Acute and chronic respiratory failure with hypercapnia: Secondary | ICD-10-CM | POA: Diagnosis not present

## 2018-04-02 DIAGNOSIS — J9621 Acute and chronic respiratory failure with hypoxia: Secondary | ICD-10-CM | POA: Diagnosis not present

## 2018-04-02 DIAGNOSIS — J441 Chronic obstructive pulmonary disease with (acute) exacerbation: Secondary | ICD-10-CM | POA: Diagnosis not present

## 2018-04-02 DIAGNOSIS — I10 Essential (primary) hypertension: Secondary | ICD-10-CM | POA: Diagnosis not present

## 2018-04-02 DIAGNOSIS — R001 Bradycardia, unspecified: Secondary | ICD-10-CM | POA: Diagnosis not present

## 2018-04-04 DIAGNOSIS — R001 Bradycardia, unspecified: Secondary | ICD-10-CM | POA: Diagnosis not present

## 2018-04-04 DIAGNOSIS — J9622 Acute and chronic respiratory failure with hypercapnia: Secondary | ICD-10-CM | POA: Diagnosis not present

## 2018-04-04 DIAGNOSIS — J441 Chronic obstructive pulmonary disease with (acute) exacerbation: Secondary | ICD-10-CM | POA: Diagnosis not present

## 2018-04-04 DIAGNOSIS — I10 Essential (primary) hypertension: Secondary | ICD-10-CM | POA: Diagnosis not present

## 2018-04-04 DIAGNOSIS — I2699 Other pulmonary embolism without acute cor pulmonale: Secondary | ICD-10-CM | POA: Diagnosis not present

## 2018-04-04 DIAGNOSIS — J9621 Acute and chronic respiratory failure with hypoxia: Secondary | ICD-10-CM | POA: Diagnosis not present

## 2018-04-08 ENCOUNTER — Other Ambulatory Visit: Payer: Self-pay | Admitting: Geriatric Medicine

## 2018-04-08 ENCOUNTER — Ambulatory Visit
Admission: RE | Admit: 2018-04-08 | Discharge: 2018-04-08 | Disposition: A | Discharge status: Home / Self Care | Payer: Medicare Other | Source: Ambulatory Visit | Attending: Geriatric Medicine | Admitting: Geriatric Medicine

## 2018-04-08 DIAGNOSIS — J181 Lobar pneumonia, unspecified organism: Principal | ICD-10-CM

## 2018-04-08 DIAGNOSIS — J189 Pneumonia, unspecified organism: Secondary | ICD-10-CM

## 2018-04-08 DIAGNOSIS — E119 Type 2 diabetes mellitus without complications: Secondary | ICD-10-CM | POA: Diagnosis not present

## 2018-04-08 DIAGNOSIS — I10 Essential (primary) hypertension: Secondary | ICD-10-CM | POA: Diagnosis not present

## 2018-04-09 DIAGNOSIS — J441 Chronic obstructive pulmonary disease with (acute) exacerbation: Secondary | ICD-10-CM | POA: Diagnosis not present

## 2018-04-09 DIAGNOSIS — R001 Bradycardia, unspecified: Secondary | ICD-10-CM | POA: Diagnosis not present

## 2018-04-09 DIAGNOSIS — J9621 Acute and chronic respiratory failure with hypoxia: Secondary | ICD-10-CM | POA: Diagnosis not present

## 2018-04-09 DIAGNOSIS — I2699 Other pulmonary embolism without acute cor pulmonale: Secondary | ICD-10-CM | POA: Diagnosis not present

## 2018-04-09 DIAGNOSIS — I10 Essential (primary) hypertension: Secondary | ICD-10-CM | POA: Diagnosis not present

## 2018-04-09 DIAGNOSIS — J9622 Acute and chronic respiratory failure with hypercapnia: Secondary | ICD-10-CM | POA: Diagnosis not present

## 2018-04-10 DIAGNOSIS — J441 Chronic obstructive pulmonary disease with (acute) exacerbation: Secondary | ICD-10-CM | POA: Diagnosis not present

## 2018-04-10 DIAGNOSIS — J9621 Acute and chronic respiratory failure with hypoxia: Secondary | ICD-10-CM | POA: Diagnosis not present

## 2018-04-10 DIAGNOSIS — R001 Bradycardia, unspecified: Secondary | ICD-10-CM | POA: Diagnosis not present

## 2018-04-10 DIAGNOSIS — I2699 Other pulmonary embolism without acute cor pulmonale: Secondary | ICD-10-CM | POA: Diagnosis not present

## 2018-04-10 DIAGNOSIS — J9622 Acute and chronic respiratory failure with hypercapnia: Secondary | ICD-10-CM | POA: Diagnosis not present

## 2018-04-10 DIAGNOSIS — I10 Essential (primary) hypertension: Secondary | ICD-10-CM | POA: Diagnosis not present

## 2018-04-11 DIAGNOSIS — J9621 Acute and chronic respiratory failure with hypoxia: Secondary | ICD-10-CM | POA: Diagnosis not present

## 2018-04-11 DIAGNOSIS — J9622 Acute and chronic respiratory failure with hypercapnia: Secondary | ICD-10-CM | POA: Diagnosis not present

## 2018-04-11 DIAGNOSIS — I10 Essential (primary) hypertension: Secondary | ICD-10-CM | POA: Diagnosis not present

## 2018-04-11 DIAGNOSIS — J441 Chronic obstructive pulmonary disease with (acute) exacerbation: Secondary | ICD-10-CM | POA: Diagnosis not present

## 2018-04-11 DIAGNOSIS — I2699 Other pulmonary embolism without acute cor pulmonale: Secondary | ICD-10-CM | POA: Diagnosis not present

## 2018-04-11 DIAGNOSIS — R001 Bradycardia, unspecified: Secondary | ICD-10-CM | POA: Diagnosis not present

## 2018-04-16 DIAGNOSIS — J9621 Acute and chronic respiratory failure with hypoxia: Secondary | ICD-10-CM | POA: Diagnosis not present

## 2018-04-16 DIAGNOSIS — I10 Essential (primary) hypertension: Secondary | ICD-10-CM | POA: Diagnosis not present

## 2018-04-16 DIAGNOSIS — R001 Bradycardia, unspecified: Secondary | ICD-10-CM | POA: Diagnosis not present

## 2018-04-16 DIAGNOSIS — J9622 Acute and chronic respiratory failure with hypercapnia: Secondary | ICD-10-CM | POA: Diagnosis not present

## 2018-04-16 DIAGNOSIS — I2699 Other pulmonary embolism without acute cor pulmonale: Secondary | ICD-10-CM | POA: Diagnosis not present

## 2018-04-16 DIAGNOSIS — J441 Chronic obstructive pulmonary disease with (acute) exacerbation: Secondary | ICD-10-CM | POA: Diagnosis not present

## 2018-04-17 DIAGNOSIS — R001 Bradycardia, unspecified: Secondary | ICD-10-CM | POA: Diagnosis not present

## 2018-04-17 DIAGNOSIS — I10 Essential (primary) hypertension: Secondary | ICD-10-CM | POA: Diagnosis not present

## 2018-04-17 DIAGNOSIS — J9621 Acute and chronic respiratory failure with hypoxia: Secondary | ICD-10-CM | POA: Diagnosis not present

## 2018-04-17 DIAGNOSIS — I2699 Other pulmonary embolism without acute cor pulmonale: Secondary | ICD-10-CM | POA: Diagnosis not present

## 2018-04-17 DIAGNOSIS — J9622 Acute and chronic respiratory failure with hypercapnia: Secondary | ICD-10-CM | POA: Diagnosis not present

## 2018-04-17 DIAGNOSIS — J441 Chronic obstructive pulmonary disease with (acute) exacerbation: Secondary | ICD-10-CM | POA: Diagnosis not present

## 2018-04-25 DIAGNOSIS — I2699 Other pulmonary embolism without acute cor pulmonale: Secondary | ICD-10-CM | POA: Diagnosis not present

## 2018-04-25 DIAGNOSIS — G4730 Sleep apnea, unspecified: Secondary | ICD-10-CM | POA: Diagnosis not present

## 2018-04-25 DIAGNOSIS — F0390 Unspecified dementia without behavioral disturbance: Secondary | ICD-10-CM | POA: Diagnosis not present

## 2018-07-11 DIAGNOSIS — J441 Chronic obstructive pulmonary disease with (acute) exacerbation: Secondary | ICD-10-CM | POA: Diagnosis not present

## 2018-07-11 DIAGNOSIS — N390 Urinary tract infection, site not specified: Secondary | ICD-10-CM | POA: Diagnosis not present

## 2018-07-11 DIAGNOSIS — E1169 Type 2 diabetes mellitus with other specified complication: Secondary | ICD-10-CM | POA: Diagnosis not present

## 2018-07-11 DIAGNOSIS — L989 Disorder of the skin and subcutaneous tissue, unspecified: Secondary | ICD-10-CM | POA: Diagnosis not present

## 2018-07-11 DIAGNOSIS — G4733 Obstructive sleep apnea (adult) (pediatric): Secondary | ICD-10-CM | POA: Diagnosis not present

## 2018-07-11 DIAGNOSIS — E559 Vitamin D deficiency, unspecified: Secondary | ICD-10-CM | POA: Diagnosis not present

## 2018-07-11 DIAGNOSIS — J9600 Acute respiratory failure, unspecified whether with hypoxia or hypercapnia: Secondary | ICD-10-CM | POA: Diagnosis not present

## 2018-07-11 DIAGNOSIS — R1111 Vomiting without nausea: Secondary | ICD-10-CM | POA: Diagnosis not present

## 2018-07-11 DIAGNOSIS — L03211 Cellulitis of face: Secondary | ICD-10-CM | POA: Diagnosis not present

## 2018-07-11 DIAGNOSIS — R4182 Altered mental status, unspecified: Secondary | ICD-10-CM | POA: Diagnosis not present

## 2018-07-11 DIAGNOSIS — I2699 Other pulmonary embolism without acute cor pulmonale: Secondary | ICD-10-CM | POA: Diagnosis not present

## 2018-07-16 DIAGNOSIS — E782 Mixed hyperlipidemia: Secondary | ICD-10-CM | POA: Diagnosis not present

## 2018-07-16 DIAGNOSIS — I10 Essential (primary) hypertension: Secondary | ICD-10-CM | POA: Diagnosis not present

## 2018-07-16 DIAGNOSIS — Z86711 Personal history of pulmonary embolism: Secondary | ICD-10-CM | POA: Diagnosis not present

## 2018-07-16 DIAGNOSIS — E039 Hypothyroidism, unspecified: Secondary | ICD-10-CM | POA: Diagnosis not present

## 2018-07-16 DIAGNOSIS — E1169 Type 2 diabetes mellitus with other specified complication: Secondary | ICD-10-CM | POA: Diagnosis not present

## 2018-07-16 DIAGNOSIS — F0150 Vascular dementia without behavioral disturbance: Secondary | ICD-10-CM | POA: Diagnosis not present

## 2018-07-16 DIAGNOSIS — G4733 Obstructive sleep apnea (adult) (pediatric): Secondary | ICD-10-CM | POA: Diagnosis not present

## 2018-07-30 DIAGNOSIS — I10 Essential (primary) hypertension: Secondary | ICD-10-CM | POA: Diagnosis not present

## 2018-07-30 DIAGNOSIS — G301 Alzheimer's disease with late onset: Secondary | ICD-10-CM | POA: Diagnosis not present

## 2018-07-30 DIAGNOSIS — E1169 Type 2 diabetes mellitus with other specified complication: Secondary | ICD-10-CM | POA: Diagnosis not present

## 2018-08-20 DIAGNOSIS — F5101 Primary insomnia: Secondary | ICD-10-CM | POA: Diagnosis not present

## 2018-08-20 DIAGNOSIS — R0902 Hypoxemia: Secondary | ICD-10-CM | POA: Diagnosis not present

## 2018-08-20 DIAGNOSIS — I10 Essential (primary) hypertension: Secondary | ICD-10-CM | POA: Diagnosis not present

## 2018-08-20 DIAGNOSIS — G301 Alzheimer's disease with late onset: Secondary | ICD-10-CM | POA: Diagnosis not present

## 2018-10-11 DIAGNOSIS — E785 Hyperlipidemia, unspecified: Secondary | ICD-10-CM | POA: Diagnosis not present

## 2018-10-11 DIAGNOSIS — E559 Vitamin D deficiency, unspecified: Secondary | ICD-10-CM | POA: Diagnosis not present

## 2018-10-11 DIAGNOSIS — E039 Hypothyroidism, unspecified: Secondary | ICD-10-CM | POA: Diagnosis not present

## 2018-10-11 DIAGNOSIS — E1169 Type 2 diabetes mellitus with other specified complication: Secondary | ICD-10-CM | POA: Diagnosis not present

## 2018-10-11 DIAGNOSIS — E782 Mixed hyperlipidemia: Secondary | ICD-10-CM | POA: Diagnosis not present

## 2018-10-11 DIAGNOSIS — I10 Essential (primary) hypertension: Secondary | ICD-10-CM | POA: Diagnosis not present

## 2018-10-15 DIAGNOSIS — E039 Hypothyroidism, unspecified: Secondary | ICD-10-CM | POA: Diagnosis not present

## 2018-10-15 DIAGNOSIS — E1169 Type 2 diabetes mellitus with other specified complication: Secondary | ICD-10-CM | POA: Diagnosis not present

## 2018-10-15 DIAGNOSIS — I10 Essential (primary) hypertension: Secondary | ICD-10-CM | POA: Diagnosis not present

## 2018-10-15 DIAGNOSIS — G4733 Obstructive sleep apnea (adult) (pediatric): Secondary | ICD-10-CM | POA: Diagnosis not present

## 2018-10-15 DIAGNOSIS — E782 Mixed hyperlipidemia: Secondary | ICD-10-CM | POA: Diagnosis not present

## 2018-10-15 DIAGNOSIS — F0150 Vascular dementia without behavioral disturbance: Secondary | ICD-10-CM | POA: Diagnosis not present

## 2018-10-15 DIAGNOSIS — Z86711 Personal history of pulmonary embolism: Secondary | ICD-10-CM | POA: Diagnosis not present

## 2018-11-20 DIAGNOSIS — G301 Alzheimer's disease with late onset: Secondary | ICD-10-CM | POA: Diagnosis not present

## 2018-11-20 DIAGNOSIS — E1169 Type 2 diabetes mellitus with other specified complication: Secondary | ICD-10-CM | POA: Diagnosis not present

## 2018-11-20 DIAGNOSIS — J9611 Chronic respiratory failure with hypoxia: Secondary | ICD-10-CM | POA: Diagnosis not present

## 2018-11-20 DIAGNOSIS — I10 Essential (primary) hypertension: Secondary | ICD-10-CM | POA: Diagnosis not present

## 2019-01-20 DIAGNOSIS — G301 Alzheimer's disease with late onset: Secondary | ICD-10-CM | POA: Diagnosis not present

## 2019-01-20 DIAGNOSIS — E1169 Type 2 diabetes mellitus with other specified complication: Secondary | ICD-10-CM | POA: Diagnosis not present

## 2019-01-20 DIAGNOSIS — F0281 Dementia in other diseases classified elsewhere with behavioral disturbance: Secondary | ICD-10-CM | POA: Diagnosis not present

## 2019-01-20 DIAGNOSIS — Z23 Encounter for immunization: Secondary | ICD-10-CM | POA: Diagnosis not present

## 2019-01-20 DIAGNOSIS — F0280 Dementia in other diseases classified elsewhere without behavioral disturbance: Secondary | ICD-10-CM | POA: Diagnosis not present

## 2019-01-20 DIAGNOSIS — E119 Type 2 diabetes mellitus without complications: Secondary | ICD-10-CM | POA: Diagnosis not present

## 2019-01-20 DIAGNOSIS — I10 Essential (primary) hypertension: Secondary | ICD-10-CM | POA: Diagnosis not present

## 2019-01-20 DIAGNOSIS — E039 Hypothyroidism, unspecified: Secondary | ICD-10-CM | POA: Diagnosis not present

## 2019-01-20 DIAGNOSIS — E7800 Pure hypercholesterolemia, unspecified: Secondary | ICD-10-CM | POA: Diagnosis not present

## 2019-01-23 DIAGNOSIS — R350 Frequency of micturition: Secondary | ICD-10-CM | POA: Diagnosis not present

## 2019-01-23 DIAGNOSIS — E7800 Pure hypercholesterolemia, unspecified: Secondary | ICD-10-CM | POA: Diagnosis not present

## 2019-01-23 DIAGNOSIS — G301 Alzheimer's disease with late onset: Secondary | ICD-10-CM | POA: Diagnosis not present

## 2019-01-23 DIAGNOSIS — J9611 Chronic respiratory failure with hypoxia: Secondary | ICD-10-CM | POA: Diagnosis not present

## 2019-01-23 DIAGNOSIS — Z79899 Other long term (current) drug therapy: Secondary | ICD-10-CM | POA: Diagnosis not present

## 2019-01-23 DIAGNOSIS — R251 Tremor, unspecified: Secondary | ICD-10-CM | POA: Diagnosis not present

## 2019-01-23 DIAGNOSIS — E1169 Type 2 diabetes mellitus with other specified complication: Secondary | ICD-10-CM | POA: Diagnosis not present

## 2019-01-23 DIAGNOSIS — I700 Atherosclerosis of aorta: Secondary | ICD-10-CM | POA: Diagnosis not present

## 2019-01-23 DIAGNOSIS — E039 Hypothyroidism, unspecified: Secondary | ICD-10-CM | POA: Diagnosis not present

## 2019-01-23 DIAGNOSIS — I10 Essential (primary) hypertension: Secondary | ICD-10-CM | POA: Diagnosis not present

## 2019-01-23 DIAGNOSIS — F0280 Dementia in other diseases classified elsewhere without behavioral disturbance: Secondary | ICD-10-CM | POA: Diagnosis not present

## 2019-01-28 DIAGNOSIS — E871 Hypo-osmolality and hyponatremia: Secondary | ICD-10-CM | POA: Diagnosis not present

## 2019-01-28 DIAGNOSIS — E782 Mixed hyperlipidemia: Secondary | ICD-10-CM | POA: Diagnosis not present

## 2019-01-28 DIAGNOSIS — I10 Essential (primary) hypertension: Secondary | ICD-10-CM | POA: Diagnosis not present

## 2019-01-28 DIAGNOSIS — E039 Hypothyroidism, unspecified: Secondary | ICD-10-CM | POA: Diagnosis not present

## 2019-01-28 DIAGNOSIS — F0390 Unspecified dementia without behavioral disturbance: Secondary | ICD-10-CM | POA: Diagnosis not present

## 2019-01-28 DIAGNOSIS — F0391 Unspecified dementia with behavioral disturbance: Secondary | ICD-10-CM | POA: Diagnosis not present

## 2019-01-28 DIAGNOSIS — G3184 Mild cognitive impairment, so stated: Secondary | ICD-10-CM | POA: Diagnosis not present

## 2019-01-28 DIAGNOSIS — E119 Type 2 diabetes mellitus without complications: Secondary | ICD-10-CM | POA: Diagnosis not present

## 2019-02-19 DIAGNOSIS — G4730 Sleep apnea, unspecified: Secondary | ICD-10-CM | POA: Diagnosis not present

## 2019-02-19 DIAGNOSIS — F0281 Dementia in other diseases classified elsewhere with behavioral disturbance: Secondary | ICD-10-CM | POA: Diagnosis not present

## 2019-02-19 DIAGNOSIS — I10 Essential (primary) hypertension: Secondary | ICD-10-CM | POA: Diagnosis not present

## 2019-02-19 DIAGNOSIS — J9611 Chronic respiratory failure with hypoxia: Secondary | ICD-10-CM | POA: Diagnosis not present

## 2019-02-19 DIAGNOSIS — G301 Alzheimer's disease with late onset: Secondary | ICD-10-CM | POA: Diagnosis not present

## 2019-02-19 DIAGNOSIS — E1169 Type 2 diabetes mellitus with other specified complication: Secondary | ICD-10-CM | POA: Diagnosis not present

## 2019-02-26 ENCOUNTER — Other Ambulatory Visit: Payer: Self-pay

## 2019-02-26 ENCOUNTER — Ambulatory Visit
Admission: EM | Admit: 2019-02-26 | Discharge: 2019-02-26 | Disposition: A | Discharge status: Home / Self Care | Payer: Medicare Other | Attending: Emergency Medicine | Admitting: Emergency Medicine

## 2019-02-26 DIAGNOSIS — N3000 Acute cystitis without hematuria: Secondary | ICD-10-CM

## 2019-02-26 DIAGNOSIS — R350 Frequency of micturition: Secondary | ICD-10-CM

## 2019-02-26 DIAGNOSIS — R8290 Unspecified abnormal findings in urine: Secondary | ICD-10-CM

## 2019-02-26 LAB — POCT URINALYSIS DIP (MANUAL ENTRY)
Blood, UA: NEGATIVE
Glucose, UA: NEGATIVE mg/dL
Ketones, POC UA: NEGATIVE mg/dL
Nitrite, UA: NEGATIVE
Spec Grav, UA: >=1.03 — AB (ref 1.010–1.025)
Urobilinogen, UA: 1 E.U./dL
pH, UA: 6 (ref 5.0–8.0)

## 2019-02-26 MED ORDER — CEPHALEXIN 500 MG PO CAPS: 500.0000 mg | ORAL_CAPSULE | Freq: Two times a day (BID) | ORAL | 0 refills | Status: AC

## 2019-02-26 NOTE — Discharge Instructions (Addendum)
Urine concerning for infection Urine culture sent.  We will call you with abnormal results.   Push fluids and get plenty of rest.   Take antibiotic as directed and to completion Follow up with PCP next week for recheck and to ensure symptoms are improving Return here or go to ER if you have any new or worsening symptoms such as fever, worsening abdominal pain, nausea/vomiting, flank pain, etc..Marland Kitchen

## 2019-02-26 NOTE — ED Triage Notes (Signed)
Pt brought in by daughter, states that pt has dementia but has increased disorientation and increased urinary frequency with foul odor for past week

## 2019-02-26 NOTE — ED Provider Notes (Signed)
[facility]   CC: "UTI"  SUBJECTIVE: HPI obtained from daughter in room  Paula Andrews is a 73 y.o. female who complains of urinary frequency, and foul urine odor x 1 week.  Daughter states she does not drink enough fluids at times.  Denies abdominal or flank pain.  Has NOT tried OTC medications.  Symptoms are made worse with urination.  Admits to similar symptoms in the past with UTI. Reports hx of dementia, with increased disorientation as well that daughter speculate may be related to her having a UTI.  Denies fever, chills, nausea, vomiting, abdominal pain, flank pain, dysuria, hematuria.    LMP: No LMP recorded. Patient has had a hysterectomy.  ROS: As in HPI.  All other pertinent ROS negative.     Past Medical History:  Diagnosis Date  . Dementia ([place])   . Diabetes mellitus   . Hypertension   . Thyroid disease    Past Surgical History:  Procedure Laterality Date  . ABDOMINAL HYSTERECTOMY     No Known Allergies No current facility-administered medications on file prior to encounter.    Current Outpatient Medications on File Prior to Encounter  Medication Sig Dispense Refill  . acetaminophen (TYLENOL) 325 MG tablet Take 650 mg by mouth 2 (two) times daily as needed for mild pain or moderate pain.    Marland Kitchen albuterol (PROVENTIL HFA;VENTOLIN HFA) 108 (90 Base) MCG/ACT inhaler Inhale 2 puffs into the lungs every 6 (six) hours as needed for wheezing or shortness of breath. 1 Inhaler 2  . donepezil (ARICEPT) 10 MG tablet Take 10 mg by mouth at bedtime.    . Fluticasone-Salmeterol (ADVAIR DISKUS) 250-50 MCG/DOSE AEPB Inhale 1 puff into the lungs 2 (two) times daily. 60 each 2  . levothyroxine (SYNTHROID, LEVOTHROID) 75 MCG tablet Take 75 mcg by mouth daily.     Marland Kitchen lisinopril (PRINIVIL,ZESTRIL) 5 MG tablet Take 1 tablet (5 mg total) by mouth daily.    . memantine (NAMENDA) 10 MG tablet Take 10 mg by mouth 2 (two) times daily.    . metFORMIN (GLUCOPHAGE) 500 MG tablet Take 500  mg by mouth daily as needed (for high blood sugars).     . metoprolol tartrate (LOPRESSOR) 25 MG tablet Take 25 mg by mouth 2 (two) times daily.    Marland Kitchen OLANZapine (ZYPREXA) 2.5 MG tablet Take 1 tablet by mouth at bedtime.  0  . pantoprazole (PROTONIX) 40 MG tablet Take 1 tablet (40 mg total) by mouth daily. 30 tablet 0  . pravastatin (PRAVACHOL) 20 MG tablet Take 20 mg by mouth every morning.     . Rivaroxaban (XARELTO) 15 MG TABS tablet Take 1 tablet (15 mg total) by mouth 2 (two) times daily with a meal for 19 days. 38 tablet 0  . rivaroxaban (XARELTO) 20 MG TABS tablet Take 1 tablet (20 mg total) by mouth daily with supper. 30 tablet 3  . [DISCONTINUED] amLODipine (NORVASC) 5 MG tablet Take 1 tablet (5 mg total) by mouth daily. 30 tablet 0   Social History   Socioeconomic History  . Marital status: Married    Spouse name: Not on file  . Number of children: Not on file  . Years of education: 61  . Highest education level: Not on file  Occupational History    Employer: RETIRED  Social Needs  . Financial resource strain: Not on file  . Food insecurity    Worry: Not on file    Inability: Not on file  .  Transportation needs    Medical: Not on file    Non-medical: Not on file  Tobacco Use  . Smoking status: Former Smoker    Packs/day: 1.00    Years: 41.00    Pack years: 41.00    Types: Cigarettes    Quit date: 04/03/2001    Years since quitting: 17.9  . Smokeless tobacco: Never Used  Substance and Sexual Activity  . Alcohol use: No  . Drug use: No  . Sexual activity: Not on file  Lifestyle  . Physical activity    Days per week: Not on file    Minutes per session: Not on file  . Stress: Not on file  Relationships  . Social Herbalist on phone: Not on file    Gets together: Not on file    Attends religious service: Not on file    Active member of club or organization: Not on file    Attends meetings of clubs or organizations: Not on file    Relationship status: Not  on file  . Intimate partner violence    Fear of current or ex partner: Not on file    Emotionally abused: Not on file    Physically abused: Not on file    Forced sexual activity: Not on file  Other Topics Concern  . Not on file  Social History Narrative  . Not on file   Family History  Problem Relation Age of Onset  . Diabetes Other     OBJECTIVE:  Vitals:   02/26/19 1114  BP: 133/78  Pulse: (!) 54  Resp: 20  Temp: 98.3 F (36.8 C)  SpO2: 92%   General appearance: Alert in no acute distress HEENT: NCAT.  Oropharynx clear.  Lungs: clear to auscultation bilaterally without adventitious breath sounds Heart: regular rate and rhythm.   Abdomen: soft; non-distended; no tenderness; bowel sounds present; no guarding Back: no CVA tenderness Extremities: no edema; symmetrical with no gross deformities Skin: warm and dry Neurologic: Ambulates from chair to exam table without difficulty Psychological: alert and cooperative; normal mood and affect  Labs Reviewed  POCT URINALYSIS DIP (MANUAL ENTRY) - Abnormal; Notable for the following components:      Result Value   Bilirubin, UA small (*)    Spec Grav, UA >=1.030 (*)    Protein Ur, POC trace (*)    Leukocytes, UA Trace (*)    All other components within normal limits    ASSESSMENT & PLAN:  1. Acute cystitis without hematuria   2. Urinary frequency   3. Abnormal urine odor     Meds ordered this encounter  Medications  . cephALEXin (KEFLEX) 500 MG capsule    Sig: Take 1 capsule (500 mg total) by mouth 2 (two) times daily for 10 days.    Dispense:  20 capsule    Refill:  0    Order Specific Question:   Supervising Provider    Answer:   Raylene Everts [id number]   Urine concerning for infection Urine culture sent.  We will call you with abnormal results.   Push fluids and get plenty of rest.   Take antibiotic as directed and to completion Follow up with PCP next week for recheck and to ensure symptoms are  improving Return here or go to ER if you have any new or worsening symptoms such as fever, worsening abdominal pain, nausea/vomiting, flank pain, etc...  Outlined signs and symptoms indicating need for more acute intervention. Patient verbalized  understanding. After Visit Summary given.     Lestine Box, PA-C 02/26/19 1214

## 2019-03-03 DIAGNOSIS — F0281 Dementia in other diseases classified elsewhere with behavioral disturbance: Secondary | ICD-10-CM | POA: Diagnosis not present

## 2019-03-03 DIAGNOSIS — F0280 Dementia in other diseases classified elsewhere without behavioral disturbance: Secondary | ICD-10-CM | POA: Diagnosis not present

## 2019-03-03 DIAGNOSIS — E1169 Type 2 diabetes mellitus with other specified complication: Secondary | ICD-10-CM | POA: Diagnosis not present

## 2019-03-03 DIAGNOSIS — E7800 Pure hypercholesterolemia, unspecified: Secondary | ICD-10-CM | POA: Diagnosis not present

## 2019-03-03 DIAGNOSIS — E039 Hypothyroidism, unspecified: Secondary | ICD-10-CM | POA: Diagnosis not present

## 2019-03-03 DIAGNOSIS — I10 Essential (primary) hypertension: Secondary | ICD-10-CM | POA: Diagnosis not present

## 2019-03-03 DIAGNOSIS — G301 Alzheimer's disease with late onset: Secondary | ICD-10-CM | POA: Diagnosis not present

## 2019-03-03 DIAGNOSIS — E119 Type 2 diabetes mellitus without complications: Secondary | ICD-10-CM | POA: Diagnosis not present

## 2019-03-10 DIAGNOSIS — I10 Essential (primary) hypertension: Secondary | ICD-10-CM | POA: Diagnosis not present

## 2019-03-10 DIAGNOSIS — F419 Anxiety disorder, unspecified: Secondary | ICD-10-CM | POA: Diagnosis not present

## 2019-03-10 DIAGNOSIS — E1169 Type 2 diabetes mellitus with other specified complication: Secondary | ICD-10-CM | POA: Diagnosis not present

## 2019-03-13 DIAGNOSIS — R6889 Other general symptoms and signs: Secondary | ICD-10-CM | POA: Diagnosis not present

## 2019-03-13 DIAGNOSIS — R350 Frequency of micturition: Secondary | ICD-10-CM | POA: Diagnosis not present

## 2019-03-21 ENCOUNTER — Other Ambulatory Visit (HOSPITAL_COMMUNITY): Payer: Self-pay | Admitting: Geriatric Medicine

## 2019-03-26 DIAGNOSIS — E1169 Type 2 diabetes mellitus with other specified complication: Secondary | ICD-10-CM | POA: Diagnosis not present

## 2019-03-26 DIAGNOSIS — E119 Type 2 diabetes mellitus without complications: Secondary | ICD-10-CM | POA: Diagnosis not present

## 2019-03-26 DIAGNOSIS — I10 Essential (primary) hypertension: Secondary | ICD-10-CM | POA: Diagnosis not present

## 2019-03-26 DIAGNOSIS — G301 Alzheimer's disease with late onset: Secondary | ICD-10-CM | POA: Diagnosis not present

## 2019-03-26 DIAGNOSIS — F0280 Dementia in other diseases classified elsewhere without behavioral disturbance: Secondary | ICD-10-CM | POA: Diagnosis not present

## 2019-03-26 DIAGNOSIS — E7800 Pure hypercholesterolemia, unspecified: Secondary | ICD-10-CM | POA: Diagnosis not present

## 2019-03-26 DIAGNOSIS — F0281 Dementia in other diseases classified elsewhere with behavioral disturbance: Secondary | ICD-10-CM | POA: Diagnosis not present

## 2019-03-26 DIAGNOSIS — E039 Hypothyroidism, unspecified: Secondary | ICD-10-CM | POA: Diagnosis not present

## 2019-03-27 ENCOUNTER — Other Ambulatory Visit (HOSPITAL_COMMUNITY): Payer: Self-pay | Admitting: Geriatric Medicine

## 2019-04-24 DIAGNOSIS — E039 Hypothyroidism, unspecified: Secondary | ICD-10-CM | POA: Diagnosis not present

## 2019-04-24 DIAGNOSIS — G3184 Mild cognitive impairment, so stated: Secondary | ICD-10-CM | POA: Diagnosis not present

## 2019-04-24 DIAGNOSIS — E782 Mixed hyperlipidemia: Secondary | ICD-10-CM | POA: Diagnosis not present

## 2019-04-24 DIAGNOSIS — I10 Essential (primary) hypertension: Secondary | ICD-10-CM | POA: Diagnosis not present

## 2019-04-24 DIAGNOSIS — F0391 Unspecified dementia with behavioral disturbance: Secondary | ICD-10-CM | POA: Diagnosis not present

## 2019-04-24 DIAGNOSIS — E119 Type 2 diabetes mellitus without complications: Secondary | ICD-10-CM | POA: Diagnosis not present

## 2019-04-24 DIAGNOSIS — F0390 Unspecified dementia without behavioral disturbance: Secondary | ICD-10-CM | POA: Diagnosis not present

## 2019-04-24 DIAGNOSIS — E871 Hypo-osmolality and hyponatremia: Secondary | ICD-10-CM | POA: Diagnosis not present

## 2019-04-30 DIAGNOSIS — E119 Type 2 diabetes mellitus without complications: Secondary | ICD-10-CM | POA: Diagnosis not present

## 2019-04-30 DIAGNOSIS — E7800 Pure hypercholesterolemia, unspecified: Secondary | ICD-10-CM | POA: Diagnosis not present

## 2019-04-30 DIAGNOSIS — F0280 Dementia in other diseases classified elsewhere without behavioral disturbance: Secondary | ICD-10-CM | POA: Diagnosis not present

## 2019-04-30 DIAGNOSIS — I10 Essential (primary) hypertension: Secondary | ICD-10-CM | POA: Diagnosis not present

## 2019-04-30 DIAGNOSIS — E1169 Type 2 diabetes mellitus with other specified complication: Secondary | ICD-10-CM | POA: Diagnosis not present

## 2019-04-30 DIAGNOSIS — E039 Hypothyroidism, unspecified: Secondary | ICD-10-CM | POA: Diagnosis not present

## 2019-04-30 DIAGNOSIS — G301 Alzheimer's disease with late onset: Secondary | ICD-10-CM | POA: Diagnosis not present

## 2019-04-30 DIAGNOSIS — F0281 Dementia in other diseases classified elsewhere with behavioral disturbance: Secondary | ICD-10-CM | POA: Diagnosis not present

## 2019-05-07 DIAGNOSIS — I10 Essential (primary) hypertension: Secondary | ICD-10-CM | POA: Diagnosis not present

## 2019-05-07 DIAGNOSIS — E1169 Type 2 diabetes mellitus with other specified complication: Secondary | ICD-10-CM | POA: Diagnosis not present

## 2019-05-07 DIAGNOSIS — Z7984 Long term (current) use of oral hypoglycemic drugs: Secondary | ICD-10-CM | POA: Diagnosis not present

## 2019-05-07 DIAGNOSIS — G301 Alzheimer's disease with late onset: Secondary | ICD-10-CM | POA: Diagnosis not present

## 2019-05-07 DIAGNOSIS — J9611 Chronic respiratory failure with hypoxia: Secondary | ICD-10-CM | POA: Diagnosis not present

## 2019-05-07 DIAGNOSIS — R21 Rash and other nonspecific skin eruption: Secondary | ICD-10-CM | POA: Diagnosis not present

## 2019-05-07 DIAGNOSIS — I700 Atherosclerosis of aorta: Secondary | ICD-10-CM | POA: Diagnosis not present

## 2019-05-07 DIAGNOSIS — Z79899 Other long term (current) drug therapy: Secondary | ICD-10-CM | POA: Diagnosis not present

## 2019-05-07 DIAGNOSIS — F0280 Dementia in other diseases classified elsewhere without behavioral disturbance: Secondary | ICD-10-CM | POA: Diagnosis not present

## 2019-05-07 DIAGNOSIS — E7800 Pure hypercholesterolemia, unspecified: Secondary | ICD-10-CM | POA: Diagnosis not present

## 2019-05-15 DIAGNOSIS — Z23 Encounter for immunization: Secondary | ICD-10-CM | POA: Diagnosis not present

## 2019-06-03 DIAGNOSIS — E7800 Pure hypercholesterolemia, unspecified: Secondary | ICD-10-CM | POA: Diagnosis not present

## 2019-06-03 DIAGNOSIS — E039 Hypothyroidism, unspecified: Secondary | ICD-10-CM | POA: Diagnosis not present

## 2019-06-03 DIAGNOSIS — I10 Essential (primary) hypertension: Secondary | ICD-10-CM | POA: Diagnosis not present

## 2019-06-03 DIAGNOSIS — F0280 Dementia in other diseases classified elsewhere without behavioral disturbance: Secondary | ICD-10-CM | POA: Diagnosis not present

## 2019-06-03 DIAGNOSIS — E1169 Type 2 diabetes mellitus with other specified complication: Secondary | ICD-10-CM | POA: Diagnosis not present

## 2019-06-03 DIAGNOSIS — E119 Type 2 diabetes mellitus without complications: Secondary | ICD-10-CM | POA: Diagnosis not present

## 2019-06-03 DIAGNOSIS — G301 Alzheimer's disease with late onset: Secondary | ICD-10-CM | POA: Diagnosis not present

## 2019-06-03 DIAGNOSIS — F0281 Dementia in other diseases classified elsewhere with behavioral disturbance: Secondary | ICD-10-CM | POA: Diagnosis not present

## 2019-06-10 ENCOUNTER — Ambulatory Visit: Payer: Medicare Other | Attending: Internal Medicine

## 2019-06-10 ENCOUNTER — Other Ambulatory Visit: Payer: Self-pay

## 2019-06-10 DIAGNOSIS — Z20822 Contact with and (suspected) exposure to covid-19: Secondary | ICD-10-CM | POA: Diagnosis not present

## 2019-06-10 NOTE — Progress Notes (Signed)
   Covid-19 Vaccination Clinic  Name:  Paula Andrews    MRN: [id number] DOB: Dec 18, 1945  06/10/2019  [name] was observed post Covid-19 immunization for 15 minutes without incident. She was provided with Vaccine Information Sheet and instruction to access the V-Safe system.   [name] was instructed to call 911 with any severe reactions post vaccine: Marland Kitchen Difficulty breathing  . Swelling of face and throat  . A fast heartbeat  . A bad rash all over body  . Dizziness and weakness

## 2019-06-11 LAB — NOVEL CORONAVIRUS, NAA: SARS-CoV-2, NAA: DETECTED — AB

## 2019-06-12 ENCOUNTER — Telehealth: Payer: Self-pay | Admitting: Internal Medicine

## 2019-06-12 NOTE — Telephone Encounter (Signed)
Called to discuss with Paula Andrews about Covid symptoms and the use of bamlanivimab, a monoclonal antibody infusion for those with mild to moderate Covid symptoms and at a high risk of hospitalization. Spoke with pt's daughter, who stated she was pt's caretaker.    Pt is qualified for this infusion at the [facility] infusion center due to co-morbid conditions and/or a member of an at-risk group, however declines infusion at this time. Symptoms tier reviewed as well as criteria for ending isolation.  Daughter states pt has dementia and would not be interested or able to sit for infusion. Daughter also c covid, but asymptomatic at this time. Number to infusion clinic given should she develop symptoms or change her mind.   Patient Active Problem List   Diagnosis Date Noted  . HCAP (healthcare-associated pneumonia)   . Enterobacter sepsis ([place]) 03/24/2018  . Sepsis due to undetermined organism ([place]) 03/23/2018  . Lobar pneumonia ([place]) 03/23/2018  . Bacteremia due to Gram-negative bacteria 03/23/2018  . Gram negative sepsis ([place]) 03/23/2018  . Pulmonary embolus ([place])   . Acute respiratory failure with hypoxia ([place]) 03/13/2018  . Acute encephalopathy 03/13/2018  . Fall 03/13/2018  . Sinus bradycardia 03/13/2018  . Dementia ([place]) 03/13/2018  . Hypothyroidism 03/13/2018  . Mood disorder ([place]) 03/13/2018  . COPD with acute exacerbation ([place]) 08/07/2017  . 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 Chronic respiratory failure with hypoxia and hypercapnia (HCC)   . Acute respiratory failure with hypoxia and hypercapnia ([place]) 05/30/2017  . Acute lower UTI 05/30/2017  . HTN (hypertension) 05/30/2017  . Diabetes mellitus type II, controlled ([place]) 05/30/2017  . HLD (hyperlipidemia) 05/30/2017  . UTI (urinary tract infection) 05/30/2017  . Hypoxia 05/29/2017  . Hyponatremia 05/16/2017  . Fracture of radial neck, left, closed 01/29/2012  . CLOSED FRACTURE  UNSPEC PART UPPER END HUMERUS 04/13/2010    Alan Ripper, NP-C [facility]

## 2019-07-17 ENCOUNTER — Ambulatory Visit: Payer: Medicare Other | Attending: Internal Medicine

## 2019-07-17 DIAGNOSIS — Z23 Encounter for immunization: Secondary | ICD-10-CM

## 2019-07-17 NOTE — Progress Notes (Signed)
   Covid-19 Vaccination Clinic  Name:  ANNSLEIGH SINER    MRN: [id number] DOB: [birth year]  07/17/2019  Ms. Sikkenga was observed post Covid-19 immunization for 15 minutes without incident. She was provided with Vaccine Information Sheet and instruction to access the V-Safe system.   Ms. Guido was instructed to call 911 with any severe reactions post vaccine: Marland Kitchen Difficulty breathing  . Swelling of face and throat  . A fast heartbeat  . A bad rash all over body  . Dizziness and weakness   Immunizations Administered    Name Date Dose VIS Date Route   Moderna COVID-19 Vaccine 07/17/2019 10:24 AM 0.5 mL 03/04/2019 Intramuscular   Manufacturer: Moderna   Lot: [id number]   [id number]

## 2019-07-29 DIAGNOSIS — E039 Hypothyroidism, unspecified: Secondary | ICD-10-CM | POA: Diagnosis not present

## 2019-07-29 DIAGNOSIS — F0280 Dementia in other diseases classified elsewhere without behavioral disturbance: Secondary | ICD-10-CM | POA: Diagnosis not present

## 2019-07-29 DIAGNOSIS — E7800 Pure hypercholesterolemia, unspecified: Secondary | ICD-10-CM | POA: Diagnosis not present

## 2019-07-29 DIAGNOSIS — G301 Alzheimer's disease with late onset: Secondary | ICD-10-CM | POA: Diagnosis not present

## 2019-07-29 DIAGNOSIS — E119 Type 2 diabetes mellitus without complications: Secondary | ICD-10-CM | POA: Diagnosis not present

## 2019-07-29 DIAGNOSIS — I10 Essential (primary) hypertension: Secondary | ICD-10-CM | POA: Diagnosis not present

## 2019-07-29 DIAGNOSIS — E1169 Type 2 diabetes mellitus with other specified complication: Secondary | ICD-10-CM | POA: Diagnosis not present

## 2019-07-29 DIAGNOSIS — F0281 Dementia in other diseases classified elsewhere with behavioral disturbance: Secondary | ICD-10-CM | POA: Diagnosis not present

## 2019-08-07 DIAGNOSIS — F419 Anxiety disorder, unspecified: Secondary | ICD-10-CM | POA: Diagnosis not present

## 2019-08-07 DIAGNOSIS — F0280 Dementia in other diseases classified elsewhere without behavioral disturbance: Secondary | ICD-10-CM | POA: Diagnosis not present

## 2019-08-07 DIAGNOSIS — E1169 Type 2 diabetes mellitus with other specified complication: Secondary | ICD-10-CM | POA: Diagnosis not present

## 2019-08-07 DIAGNOSIS — G301 Alzheimer's disease with late onset: Secondary | ICD-10-CM | POA: Diagnosis not present

## 2019-08-07 DIAGNOSIS — I10 Essential (primary) hypertension: Secondary | ICD-10-CM | POA: Diagnosis not present

## 2019-08-25 ENCOUNTER — Other Ambulatory Visit: Payer: Self-pay | Admitting: Geriatric Medicine

## 2019-08-25 DIAGNOSIS — E1169 Type 2 diabetes mellitus with other specified complication: Secondary | ICD-10-CM | POA: Diagnosis not present

## 2019-08-25 DIAGNOSIS — F0280 Dementia in other diseases classified elsewhere without behavioral disturbance: Secondary | ICD-10-CM | POA: Diagnosis not present

## 2019-08-25 DIAGNOSIS — Z7984 Long term (current) use of oral hypoglycemic drugs: Secondary | ICD-10-CM | POA: Diagnosis not present

## 2019-08-25 DIAGNOSIS — Z1389 Encounter for screening for other disorder: Secondary | ICD-10-CM | POA: Diagnosis not present

## 2019-08-25 DIAGNOSIS — J9611 Chronic respiratory failure with hypoxia: Secondary | ICD-10-CM | POA: Diagnosis not present

## 2019-08-25 DIAGNOSIS — G301 Alzheimer's disease with late onset: Secondary | ICD-10-CM | POA: Diagnosis not present

## 2019-08-25 DIAGNOSIS — Z0000 Encounter for general adult medical examination without abnormal findings: Secondary | ICD-10-CM | POA: Diagnosis not present

## 2019-08-25 DIAGNOSIS — N951 Menopausal and female climacteric states: Secondary | ICD-10-CM | POA: Diagnosis not present

## 2019-08-25 DIAGNOSIS — J438 Other emphysema: Secondary | ICD-10-CM | POA: Diagnosis not present

## 2019-08-25 DIAGNOSIS — I700 Atherosclerosis of aorta: Secondary | ICD-10-CM | POA: Diagnosis not present

## 2019-08-25 DIAGNOSIS — E7800 Pure hypercholesterolemia, unspecified: Secondary | ICD-10-CM | POA: Diagnosis not present

## 2019-08-25 DIAGNOSIS — I10 Essential (primary) hypertension: Secondary | ICD-10-CM | POA: Diagnosis not present

## 2019-08-25 DIAGNOSIS — Z1211 Encounter for screening for malignant neoplasm of colon: Secondary | ICD-10-CM | POA: Diagnosis not present

## 2019-08-28 ENCOUNTER — Other Ambulatory Visit: Payer: Self-pay | Admitting: Geriatric Medicine

## 2019-08-28 DIAGNOSIS — E871 Hypo-osmolality and hyponatremia: Secondary | ICD-10-CM | POA: Diagnosis not present

## 2019-08-28 DIAGNOSIS — F0390 Unspecified dementia without behavioral disturbance: Secondary | ICD-10-CM | POA: Diagnosis not present

## 2019-08-28 DIAGNOSIS — E782 Mixed hyperlipidemia: Secondary | ICD-10-CM | POA: Diagnosis not present

## 2019-08-28 DIAGNOSIS — E119 Type 2 diabetes mellitus without complications: Secondary | ICD-10-CM | POA: Diagnosis not present

## 2019-08-28 DIAGNOSIS — F0391 Unspecified dementia with behavioral disturbance: Secondary | ICD-10-CM | POA: Diagnosis not present

## 2019-08-28 DIAGNOSIS — E039 Hypothyroidism, unspecified: Secondary | ICD-10-CM | POA: Diagnosis not present

## 2019-08-28 DIAGNOSIS — E2839 Other primary ovarian failure: Secondary | ICD-10-CM

## 2019-08-28 DIAGNOSIS — Z1231 Encounter for screening mammogram for malignant neoplasm of breast: Secondary | ICD-10-CM

## 2019-08-28 DIAGNOSIS — G3184 Mild cognitive impairment, so stated: Secondary | ICD-10-CM | POA: Diagnosis not present

## 2019-08-28 DIAGNOSIS — I10 Essential (primary) hypertension: Secondary | ICD-10-CM | POA: Diagnosis not present

## 2019-09-04 DIAGNOSIS — E1169 Type 2 diabetes mellitus with other specified complication: Secondary | ICD-10-CM | POA: Diagnosis not present

## 2019-09-04 DIAGNOSIS — G4733 Obstructive sleep apnea (adult) (pediatric): Secondary | ICD-10-CM | POA: Diagnosis not present

## 2019-09-04 DIAGNOSIS — E039 Hypothyroidism, unspecified: Secondary | ICD-10-CM | POA: Diagnosis not present

## 2019-09-04 DIAGNOSIS — F0150 Vascular dementia without behavioral disturbance: Secondary | ICD-10-CM | POA: Diagnosis not present

## 2019-09-04 DIAGNOSIS — E782 Mixed hyperlipidemia: Secondary | ICD-10-CM | POA: Diagnosis not present

## 2019-09-04 DIAGNOSIS — I10 Essential (primary) hypertension: Secondary | ICD-10-CM | POA: Diagnosis not present

## 2019-09-04 DIAGNOSIS — Z86711 Personal history of pulmonary embolism: Secondary | ICD-10-CM | POA: Diagnosis not present

## 2019-09-04 DIAGNOSIS — R251 Tremor, unspecified: Secondary | ICD-10-CM | POA: Diagnosis not present

## 2019-10-20 DIAGNOSIS — E119 Type 2 diabetes mellitus without complications: Secondary | ICD-10-CM | POA: Diagnosis not present

## 2019-10-20 DIAGNOSIS — Z87891 Personal history of nicotine dependence: Secondary | ICD-10-CM | POA: Diagnosis not present

## 2019-10-20 DIAGNOSIS — Z7984 Long term (current) use of oral hypoglycemic drugs: Secondary | ICD-10-CM | POA: Diagnosis not present

## 2019-10-20 DIAGNOSIS — F0150 Vascular dementia without behavioral disturbance: Secondary | ICD-10-CM | POA: Diagnosis not present

## 2019-10-20 DIAGNOSIS — G4733 Obstructive sleep apnea (adult) (pediatric): Secondary | ICD-10-CM | POA: Diagnosis not present

## 2019-10-20 DIAGNOSIS — I674 Hypertensive encephalopathy: Secondary | ICD-10-CM | POA: Diagnosis not present

## 2019-10-20 DIAGNOSIS — E782 Mixed hyperlipidemia: Secondary | ICD-10-CM | POA: Diagnosis not present

## 2019-10-20 DIAGNOSIS — E039 Hypothyroidism, unspecified: Secondary | ICD-10-CM | POA: Diagnosis not present

## 2019-10-20 DIAGNOSIS — Z86711 Personal history of pulmonary embolism: Secondary | ICD-10-CM | POA: Diagnosis not present

## 2019-10-20 DIAGNOSIS — I10 Essential (primary) hypertension: Secondary | ICD-10-CM | POA: Diagnosis not present

## 2019-10-22 DIAGNOSIS — F0150 Vascular dementia without behavioral disturbance: Secondary | ICD-10-CM | POA: Diagnosis not present

## 2019-10-22 DIAGNOSIS — E119 Type 2 diabetes mellitus without complications: Secondary | ICD-10-CM | POA: Diagnosis not present

## 2019-10-22 DIAGNOSIS — E782 Mixed hyperlipidemia: Secondary | ICD-10-CM | POA: Diagnosis not present

## 2019-10-22 DIAGNOSIS — E039 Hypothyroidism, unspecified: Secondary | ICD-10-CM | POA: Diagnosis not present

## 2019-10-22 DIAGNOSIS — I10 Essential (primary) hypertension: Secondary | ICD-10-CM | POA: Diagnosis not present

## 2019-10-22 DIAGNOSIS — I674 Hypertensive encephalopathy: Secondary | ICD-10-CM | POA: Diagnosis not present

## 2019-10-23 DIAGNOSIS — I674 Hypertensive encephalopathy: Secondary | ICD-10-CM | POA: Diagnosis not present

## 2019-10-23 DIAGNOSIS — E782 Mixed hyperlipidemia: Secondary | ICD-10-CM | POA: Diagnosis not present

## 2019-10-23 DIAGNOSIS — I10 Essential (primary) hypertension: Secondary | ICD-10-CM | POA: Diagnosis not present

## 2019-10-23 DIAGNOSIS — E119 Type 2 diabetes mellitus without complications: Secondary | ICD-10-CM | POA: Diagnosis not present

## 2019-10-23 DIAGNOSIS — E039 Hypothyroidism, unspecified: Secondary | ICD-10-CM | POA: Diagnosis not present

## 2019-10-23 DIAGNOSIS — F0150 Vascular dementia without behavioral disturbance: Secondary | ICD-10-CM | POA: Diagnosis not present

## 2019-10-24 DIAGNOSIS — I674 Hypertensive encephalopathy: Secondary | ICD-10-CM | POA: Diagnosis not present

## 2019-10-24 DIAGNOSIS — E782 Mixed hyperlipidemia: Secondary | ICD-10-CM | POA: Diagnosis not present

## 2019-10-24 DIAGNOSIS — E039 Hypothyroidism, unspecified: Secondary | ICD-10-CM | POA: Diagnosis not present

## 2019-10-24 DIAGNOSIS — F0150 Vascular dementia without behavioral disturbance: Secondary | ICD-10-CM | POA: Diagnosis not present

## 2019-10-24 DIAGNOSIS — I10 Essential (primary) hypertension: Secondary | ICD-10-CM | POA: Diagnosis not present

## 2019-10-24 DIAGNOSIS — E119 Type 2 diabetes mellitus without complications: Secondary | ICD-10-CM | POA: Diagnosis not present

## 2019-10-28 DIAGNOSIS — I10 Essential (primary) hypertension: Secondary | ICD-10-CM | POA: Diagnosis not present

## 2019-10-28 DIAGNOSIS — I674 Hypertensive encephalopathy: Secondary | ICD-10-CM | POA: Diagnosis not present

## 2019-10-28 DIAGNOSIS — E039 Hypothyroidism, unspecified: Secondary | ICD-10-CM | POA: Diagnosis not present

## 2019-10-28 DIAGNOSIS — F0150 Vascular dementia without behavioral disturbance: Secondary | ICD-10-CM | POA: Diagnosis not present

## 2019-10-28 DIAGNOSIS — E119 Type 2 diabetes mellitus without complications: Secondary | ICD-10-CM | POA: Diagnosis not present

## 2019-10-28 DIAGNOSIS — E782 Mixed hyperlipidemia: Secondary | ICD-10-CM | POA: Diagnosis not present

## 2019-10-30 DIAGNOSIS — E782 Mixed hyperlipidemia: Secondary | ICD-10-CM | POA: Diagnosis not present

## 2019-10-30 DIAGNOSIS — I10 Essential (primary) hypertension: Secondary | ICD-10-CM | POA: Diagnosis not present

## 2019-10-30 DIAGNOSIS — I674 Hypertensive encephalopathy: Secondary | ICD-10-CM | POA: Diagnosis not present

## 2019-10-30 DIAGNOSIS — E039 Hypothyroidism, unspecified: Secondary | ICD-10-CM | POA: Diagnosis not present

## 2019-10-30 DIAGNOSIS — F0150 Vascular dementia without behavioral disturbance: Secondary | ICD-10-CM | POA: Diagnosis not present

## 2019-10-30 DIAGNOSIS — E119 Type 2 diabetes mellitus without complications: Secondary | ICD-10-CM | POA: Diagnosis not present

## 2019-11-04 DIAGNOSIS — E782 Mixed hyperlipidemia: Secondary | ICD-10-CM | POA: Diagnosis not present

## 2019-11-04 DIAGNOSIS — I674 Hypertensive encephalopathy: Secondary | ICD-10-CM | POA: Diagnosis not present

## 2019-11-04 DIAGNOSIS — E039 Hypothyroidism, unspecified: Secondary | ICD-10-CM | POA: Diagnosis not present

## 2019-11-04 DIAGNOSIS — I10 Essential (primary) hypertension: Secondary | ICD-10-CM | POA: Diagnosis not present

## 2019-11-04 DIAGNOSIS — E119 Type 2 diabetes mellitus without complications: Secondary | ICD-10-CM | POA: Diagnosis not present

## 2019-11-04 DIAGNOSIS — F0150 Vascular dementia without behavioral disturbance: Secondary | ICD-10-CM | POA: Diagnosis not present

## 2019-11-05 DIAGNOSIS — E782 Mixed hyperlipidemia: Secondary | ICD-10-CM | POA: Diagnosis not present

## 2019-11-05 DIAGNOSIS — I10 Essential (primary) hypertension: Secondary | ICD-10-CM | POA: Diagnosis not present

## 2019-11-05 DIAGNOSIS — F0150 Vascular dementia without behavioral disturbance: Secondary | ICD-10-CM | POA: Diagnosis not present

## 2019-11-05 DIAGNOSIS — E039 Hypothyroidism, unspecified: Secondary | ICD-10-CM | POA: Diagnosis not present

## 2019-11-05 DIAGNOSIS — I674 Hypertensive encephalopathy: Secondary | ICD-10-CM | POA: Diagnosis not present

## 2019-11-05 DIAGNOSIS — E119 Type 2 diabetes mellitus without complications: Secondary | ICD-10-CM | POA: Diagnosis not present

## 2019-11-09 DIAGNOSIS — Z7984 Long term (current) use of oral hypoglycemic drugs: Secondary | ICD-10-CM | POA: Diagnosis not present

## 2019-11-09 DIAGNOSIS — Z79890 Hormone replacement therapy (postmenopausal): Secondary | ICD-10-CM | POA: Diagnosis not present

## 2019-11-09 DIAGNOSIS — R918 Other nonspecific abnormal finding of lung field: Secondary | ICD-10-CM | POA: Diagnosis not present

## 2019-11-09 DIAGNOSIS — I7090 Unspecified atherosclerosis: Secondary | ICD-10-CM | POA: Diagnosis not present

## 2019-11-09 DIAGNOSIS — F0391 Unspecified dementia with behavioral disturbance: Secondary | ICD-10-CM | POA: Diagnosis not present

## 2019-11-09 DIAGNOSIS — J8489 Other specified interstitial pulmonary diseases: Secondary | ICD-10-CM | POA: Diagnosis not present

## 2019-11-09 DIAGNOSIS — R4182 Altered mental status, unspecified: Secondary | ICD-10-CM | POA: Diagnosis not present

## 2019-11-09 DIAGNOSIS — G9389 Other specified disorders of brain: Secondary | ICD-10-CM | POA: Diagnosis not present

## 2019-11-09 DIAGNOSIS — Z79899 Other long term (current) drug therapy: Secondary | ICD-10-CM | POA: Diagnosis not present

## 2019-11-09 DIAGNOSIS — I700 Atherosclerosis of aorta: Secondary | ICD-10-CM | POA: Diagnosis not present

## 2019-11-09 DIAGNOSIS — R0902 Hypoxemia: Secondary | ICD-10-CM | POA: Diagnosis not present

## 2019-11-09 DIAGNOSIS — R9082 White matter disease, unspecified: Secondary | ICD-10-CM | POA: Diagnosis not present

## 2019-11-09 DIAGNOSIS — I10 Essential (primary) hypertension: Secondary | ICD-10-CM | POA: Diagnosis not present

## 2019-11-14 DIAGNOSIS — E039 Hypothyroidism, unspecified: Secondary | ICD-10-CM | POA: Diagnosis not present

## 2019-11-14 DIAGNOSIS — E782 Mixed hyperlipidemia: Secondary | ICD-10-CM | POA: Diagnosis not present

## 2019-11-14 DIAGNOSIS — I674 Hypertensive encephalopathy: Secondary | ICD-10-CM | POA: Diagnosis not present

## 2019-11-14 DIAGNOSIS — F0150 Vascular dementia without behavioral disturbance: Secondary | ICD-10-CM | POA: Diagnosis not present

## 2019-11-14 DIAGNOSIS — I10 Essential (primary) hypertension: Secondary | ICD-10-CM | POA: Diagnosis not present

## 2019-11-14 DIAGNOSIS — E119 Type 2 diabetes mellitus without complications: Secondary | ICD-10-CM | POA: Diagnosis not present

## 2019-11-18 DIAGNOSIS — E782 Mixed hyperlipidemia: Secondary | ICD-10-CM | POA: Diagnosis not present

## 2019-11-18 DIAGNOSIS — I674 Hypertensive encephalopathy: Secondary | ICD-10-CM | POA: Diagnosis not present

## 2019-11-18 DIAGNOSIS — E039 Hypothyroidism, unspecified: Secondary | ICD-10-CM | POA: Diagnosis not present

## 2019-11-18 DIAGNOSIS — E119 Type 2 diabetes mellitus without complications: Secondary | ICD-10-CM | POA: Diagnosis not present

## 2019-11-18 DIAGNOSIS — F0150 Vascular dementia without behavioral disturbance: Secondary | ICD-10-CM | POA: Diagnosis not present

## 2019-11-18 DIAGNOSIS — I10 Essential (primary) hypertension: Secondary | ICD-10-CM | POA: Diagnosis not present

## 2019-11-19 DIAGNOSIS — G4733 Obstructive sleep apnea (adult) (pediatric): Secondary | ICD-10-CM | POA: Diagnosis not present

## 2019-11-19 DIAGNOSIS — Z87891 Personal history of nicotine dependence: Secondary | ICD-10-CM | POA: Diagnosis not present

## 2019-11-19 DIAGNOSIS — L03211 Cellulitis of face: Secondary | ICD-10-CM | POA: Diagnosis not present

## 2019-11-19 DIAGNOSIS — E1169 Type 2 diabetes mellitus with other specified complication: Secondary | ICD-10-CM | POA: Diagnosis not present

## 2019-11-19 DIAGNOSIS — J441 Chronic obstructive pulmonary disease with (acute) exacerbation: Secondary | ICD-10-CM | POA: Diagnosis not present

## 2019-11-19 DIAGNOSIS — E039 Hypothyroidism, unspecified: Secondary | ICD-10-CM | POA: Diagnosis not present

## 2019-11-19 DIAGNOSIS — I2699 Other pulmonary embolism without acute cor pulmonale: Secondary | ICD-10-CM | POA: Diagnosis not present

## 2019-11-19 DIAGNOSIS — L989 Disorder of the skin and subcutaneous tissue, unspecified: Secondary | ICD-10-CM | POA: Diagnosis not present

## 2019-11-19 DIAGNOSIS — N390 Urinary tract infection, site not specified: Secondary | ICD-10-CM | POA: Diagnosis not present

## 2019-11-19 DIAGNOSIS — F0150 Vascular dementia without behavioral disturbance: Secondary | ICD-10-CM | POA: Diagnosis not present

## 2019-11-19 DIAGNOSIS — R4182 Altered mental status, unspecified: Secondary | ICD-10-CM | POA: Diagnosis not present

## 2019-11-19 DIAGNOSIS — I674 Hypertensive encephalopathy: Secondary | ICD-10-CM | POA: Diagnosis not present

## 2019-11-19 DIAGNOSIS — E559 Vitamin D deficiency, unspecified: Secondary | ICD-10-CM | POA: Diagnosis not present

## 2019-11-19 DIAGNOSIS — E782 Mixed hyperlipidemia: Secondary | ICD-10-CM | POA: Diagnosis not present

## 2019-11-19 DIAGNOSIS — R1111 Vomiting without nausea: Secondary | ICD-10-CM | POA: Diagnosis not present

## 2019-11-19 DIAGNOSIS — Z7984 Long term (current) use of oral hypoglycemic drugs: Secondary | ICD-10-CM | POA: Diagnosis not present

## 2019-11-19 DIAGNOSIS — Z86711 Personal history of pulmonary embolism: Secondary | ICD-10-CM | POA: Diagnosis not present

## 2019-11-19 DIAGNOSIS — I10 Essential (primary) hypertension: Secondary | ICD-10-CM | POA: Diagnosis not present

## 2019-11-19 DIAGNOSIS — J9600 Acute respiratory failure, unspecified whether with hypoxia or hypercapnia: Secondary | ICD-10-CM | POA: Diagnosis not present

## 2019-11-19 DIAGNOSIS — E119 Type 2 diabetes mellitus without complications: Secondary | ICD-10-CM | POA: Diagnosis not present

## 2019-11-24 ENCOUNTER — Other Ambulatory Visit: Payer: Self-pay

## 2019-11-24 ENCOUNTER — Emergency Department (HOSPITAL_COMMUNITY)
Admission: EM | Admit: 2019-11-24 | Discharge: 2019-11-28 | Disposition: A | Discharge status: Home / Self Care | Payer: Medicare Other | Attending: Emergency Medicine | Admitting: Emergency Medicine

## 2019-11-24 ENCOUNTER — Encounter (HOSPITAL_COMMUNITY): Payer: Self-pay

## 2019-11-24 DIAGNOSIS — J449 Chronic obstructive pulmonary disease, unspecified: Secondary | ICD-10-CM | POA: Insufficient documentation

## 2019-11-24 DIAGNOSIS — Z87891 Personal history of nicotine dependence: Secondary | ICD-10-CM | POA: Diagnosis not present

## 2019-11-24 DIAGNOSIS — R45851 Suicidal ideations: Secondary | ICD-10-CM | POA: Insufficient documentation

## 2019-11-24 DIAGNOSIS — Z79899 Other long term (current) drug therapy: Secondary | ICD-10-CM | POA: Diagnosis not present

## 2019-11-24 DIAGNOSIS — Z20822 Contact with and (suspected) exposure to covid-19: Secondary | ICD-10-CM | POA: Insufficient documentation

## 2019-11-24 DIAGNOSIS — Z7901 Long term (current) use of anticoagulants: Secondary | ICD-10-CM | POA: Insufficient documentation

## 2019-11-24 DIAGNOSIS — I10 Essential (primary) hypertension: Secondary | ICD-10-CM | POA: Insufficient documentation

## 2019-11-24 DIAGNOSIS — E039 Hypothyroidism, unspecified: Secondary | ICD-10-CM | POA: Diagnosis not present

## 2019-11-24 DIAGNOSIS — Z7984 Long term (current) use of oral hypoglycemic drugs: Secondary | ICD-10-CM | POA: Diagnosis not present

## 2019-11-24 DIAGNOSIS — E119 Type 2 diabetes mellitus without complications: Secondary | ICD-10-CM | POA: Diagnosis not present

## 2019-11-24 DIAGNOSIS — R456 Violent behavior: Secondary | ICD-10-CM | POA: Diagnosis present

## 2019-11-24 DIAGNOSIS — F0390 Unspecified dementia without behavioral disturbance: Secondary | ICD-10-CM | POA: Insufficient documentation

## 2019-11-24 MED ORDER — DONEPEZIL HCL 5 MG PO TABS
10.0000 mg | ORAL_TABLET | Freq: Every day | ORAL | Status: DC
  Administered 2019-11-25 – 2019-11-27 (×3): 10 mg via ORAL
  Filled 2019-11-24 (×2): qty 2
  Filled 2019-11-24: qty 1
  Filled 2019-11-24 (×2): qty 2
  Filled 2019-11-24: qty 1
  Filled 2019-11-24: qty 2

## 2019-11-24 MED ORDER — LEVOTHYROXINE SODIUM 50 MCG PO TABS
75.0000 ug | ORAL_TABLET | Freq: Every day | ORAL | Status: DC
  Administered 2019-11-26 – 2019-11-28 (×3): 75 ug via ORAL
  Filled 2019-11-24 (×2): qty 2

## 2019-11-24 MED ORDER — OLANZAPINE 5 MG PO TABS
2.5000 mg | ORAL_TABLET | Freq: Every day | ORAL | Status: DC
  Administered 2019-11-25 – 2019-11-27 (×3): 2.5 mg via ORAL
  Filled 2019-11-24 (×3): qty 1

## 2019-11-24 MED ORDER — ACETAMINOPHEN 325 MG PO TABS: 650.0000 mg | ORAL_TABLET | Freq: Two times a day (BID) | ORAL | Status: DC | PRN

## 2019-11-24 MED ORDER — MEMANTINE HCL 10 MG PO TABS
10.0000 mg | ORAL_TABLET | Freq: Two times a day (BID) | ORAL | Status: DC
  Administered 2019-11-25 – 2019-11-28 (×6): 10 mg via ORAL
  Filled 2019-11-24 (×6): qty 1

## 2019-11-24 NOTE — ED Notes (Signed)
Pt changed into burgundy scrubs. Pt's clothing locked up in locker room. Pt's watch and wedding ring set locked up with security.

## 2019-11-24 NOTE — ED Triage Notes (Signed)
Pt to er with Paula Andrews, pt is involuntary, pt has a hx of dementia, paperwork states that pt is here for si, states that she was going to cut herself of walk off.  Pd states that she lives with her husband.

## 2019-11-24 NOTE — ED Provider Notes (Signed)
[facility] EMERGENCY DEPARTMENT Provider Note   CSN: [id number] Arrival date & time: 11/24/19  2111     History Chief Complaint  Patient presents with  . Suicidal   Level 5 caveat due to dementia Paula Andrews is a 74 y.o. female.  The history is provided by the patient.  Mental Health Problem Presenting symptoms: aggressive behavior and suicidal thoughts   Degree of incapacity (severity):  Moderate Onset quality:  Gradual Timing:  Constant Progression:  Worsening Chronicity:  New Relieved by:  Nothing Worsened by:  Nothing  Patient history dementia, diabetes, hypertension presents under involuntary commitment Which reported the patient was threatening to harm herself by cutting her wrist She has attempted suicide in the past.  She also has been known to wander out in the road.  It is reported patient has threatened violence as well.   Patient reports that she actually needs to get home to stay with her mother. Past Medical History:  Diagnosis Date  . Dementia ([place])   . Diabetes mellitus   . Hypertension   . Thyroid disease     Patient Active Problem List   Diagnosis Date Noted  . HCAP (healthcare-associated pneumonia)   . Enterobacter sepsis ([place]) 03/24/2018  . Sepsis due to undetermined organism ([place]) 03/23/2018  . Lobar pneumonia ([place]) 03/23/2018  . Bacteremia due to Gram-negative bacteria 03/23/2018  . Gram negative sepsis ([place]) 03/23/2018  . Pulmonary embolus ([place])   . Acute respiratory failure with hypoxia ([place]) 03/13/2018  . Acute encephalopathy 03/13/2018  . Fall 03/13/2018  . Sinus bradycardia 03/13/2018  . Dementia ([place]) 03/13/2018  . Hypothyroidism 03/13/2018  . Mood disorder ([place]) 03/13/2018  . COPD with acute exacerbation ([place]) 08/07/2017  . 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 Chronic respiratory failure with hypoxia and hypercapnia (HCC)   . Acute respiratory failure with hypoxia and hypercapnia  ([place]) 05/30/2017  . Acute lower UTI 05/30/2017  . HTN (hypertension) 05/30/2017  . Diabetes mellitus type II, controlled ([place]) 05/30/2017  . HLD (hyperlipidemia) 05/30/2017  . UTI (urinary tract infection) 05/30/2017  . Hypoxia 05/29/2017  . Hyponatremia 05/16/2017  . Fracture of radial neck, left, closed 01/29/2012  . CLOSED FRACTURE UNSPEC PART UPPER END HUMERUS 04/13/2010    Past Surgical History:  Procedure Laterality Date  . ABDOMINAL HYSTERECTOMY       OB History   No obstetric history on file.     Family History  Problem Relation Age of Onset  . Diabetes Other     Social History   Tobacco Use  . Smoking status: Former Smoker    Packs/day: 1.00    Years: 41.00    Pack years: 41.00    Types: Cigarettes    Quit date: 04/03/2001    Years since quitting: 18.6  . Smokeless tobacco: Never Used  Vaping Use  . Vaping Use: Never used  Substance Use Topics  . Alcohol use: No  . Drug use: No    Home Medications Prior to Admission medications   Medication Sig Start Date End Date Taking? Authorizing Provider  acetaminophen (TYLENOL) 325 MG tablet Take 650 mg by mouth 2 (two) times daily as needed for mild pain or moderate pain.    [provider]  albuterol (PROVENTIL HFA;VENTOLIN HFA) 108 (90 Base) MCG/ACT inhaler Inhale 2 puffs into the lungs every 6 (six) hours as needed for wheezing or shortness of breath. 06/01/17   Raiford Noble Latif, DO  donepezil (ARICEPT) 10  MG tablet Take 10 mg by mouth at bedtime.    [provider]  Fluticasone-Salmeterol (ADVAIR DISKUS) 250-50 MCG/DOSE AEPB Inhale 1 puff into the lungs 2 (two) times daily. 03/17/18 06/15/18  Barton Dubois, MD  levothyroxine (SYNTHROID, LEVOTHROID) 75 MCG tablet Take 75 mcg by mouth daily.     [provider]  lisinopril (PRINIVIL,ZESTRIL) 5 MG tablet Take 1 tablet (5 mg total) by mouth daily. 03/30/18   Eugenie Filler, MD  memantine (NAMENDA) 10 MG tablet Take 10 mg by mouth 2  (two) times daily. 02/22/18   [provider]  metFORMIN (GLUCOPHAGE) 500 MG tablet Take 500 mg by mouth daily as needed (for high blood sugars).     [provider]  metoprolol tartrate (LOPRESSOR) 25 MG tablet Take 25 mg by mouth 2 (two) times daily. 01/24/18   [provider]  OLANZapine (ZYPREXA) 2.5 MG tablet Take 1 tablet by mouth at bedtime. 01/10/18   [provider]  pantoprazole (PROTONIX) 40 MG tablet Take 1 tablet (40 mg total) by mouth daily. 03/18/18   Barton Dubois, MD  pravastatin (PRAVACHOL) 20 MG tablet Take 20 mg by mouth every morning.     [provider]  Rivaroxaban (XARELTO) 15 MG TABS tablet Take 1 tablet (15 mg total) by mouth 2 (two) times daily with a meal for 19 days. 03/17/18 04/05/18  Barton Dubois, MD  rivaroxaban (XARELTO) 20 MG TABS tablet Take 1 tablet (20 mg total) by mouth daily with supper. 04/06/18   Barton Dubois, MD  amLODipine (NORVASC) 5 MG tablet Take 1 tablet (5 mg total) by mouth daily. 03/29/18 02/26/19  Eugenie Filler, MD    Allergies    Patient has no known allergies.  Review of Systems   Review of Systems  Unable to perform ROS: Dementia  Psychiatric/Behavioral: Positive for suicidal ideas.    Physical Exam Updated Vital Signs BP 115/65 (BP Location: Right Arm)   Pulse (!) 51   Temp 98 F (36.7 C) (Oral)   Resp 18   Ht 1.626 m (5\' 4" )   Wt 63.5 kg   SpO2 96%   BMI 24.03 kg/m   Physical Exam CONSTITUTIONAL: Elderly, no acute distress HEAD: Normocephalic/atraumatic EYES: EOMI ENMT: Mucous membranes moist NECK: supple no meningeal signs SPINE/BACK:entire spine nontender CV: S1/S2 noted LUNGS: Lungs are clear to auscultation bilaterally, no apparent distress ABDOMEN: soft, nontender NEURO: Pt is awake/alert, moves all extremitiesx4.  No facial droop.  Patient is pleasantly demented EXTREMITIES: pulses normal/equal, full ROM SKIN: warm, color normal PSYCH: Patient calm and  cooperative,  ED Results / Procedures / Treatments   Labs (all labs ordered are listed, but only abnormal results are displayed) Labs Reviewed  COMPREHENSIVE METABOLIC PANEL - Abnormal; Notable for the following components:      Result Value   Glucose, Bld 108 (*)    All other components within normal limits  URINALYSIS, ROUTINE W REFLEX MICROSCOPIC - Abnormal; Notable for the following components:   Color, Urine STRAW (*)    Specific Gravity, Urine 1.002 (*)    All other components within normal limits  SARS CORONAVIRUS 2 BY RT PCR (HOSPITAL ORDER, [facility])  CBC WITH DIFFERENTIAL/PLATELET  ETHANOL  RAPID URINE DRUG SCREEN, HOSP PERFORMED    EKG None  Radiology No results found.  Procedures Procedures  Medications Ordered in ED Medications  acetaminophen (TYLENOL) tablet 650 mg (has no administration in time range)  donepezil (ARICEPT) tablet 10 mg (  10 mg Oral Not Given 11/25/19 0005)  levothyroxine (SYNTHROID) tablet 75 mcg (has no administration in time range)  memantine (NAMENDA) tablet 10 mg (10 mg Oral Not Given 11/25/19 0006)  OLANZapine (ZYPREXA) tablet 2.5 mg (2.5 mg Oral Not Given 11/25/19 0006)    ED Course  I have reviewed the triage vital signs and the nursing notes.  Pertinent labs results that were available during my care of the patient were reviewed by me and considered in my medical decision making (see chart for details).    MDM Rules/Calculators/A&P                          11:53 PM Patient presents under IVC.  It is reported that she has been threatening suicide and wandering out of her house. Patient has history of dementia, is a poor historian.  She reports she needs to get back to her mother's house. She is currently calm and cooperative. Will consult psychiatry and complete IVC for now. I have attempted to call family as listed on the IVC without any response. It is reported in her notes the patient is on  anticoagulation, but this is unclear if it is current.  I will not start all medications at this time till this is clarified 2:29 AM Patient stable.  Labs reassuring. She is awaiting psychiatric evaluation.  Still no word from her family  The patient has been placed in psychiatric observation due to the need to provide a safe environment for the patient while obtaining psychiatric consultation and evaluation, as well as ongoing medical and medication management to treat the patient's condition.  The patient has been placed under full IVC at this time.  Final Clinical Impression(s) / ED Diagnoses Final diagnoses:  Suicidal ideation    Rx / DC Orders ED Discharge Orders    None       Ripley Fraise, MD 11/25/19 [id number]

## 2019-11-24 NOTE — ED Triage Notes (Signed)
Pt oriented to person, place, doesn't know day of the week, reoriented pt.

## 2019-11-25 DIAGNOSIS — F0390 Unspecified dementia without behavioral disturbance: Secondary | ICD-10-CM | POA: Diagnosis not present

## 2019-11-25 LAB — URINALYSIS, ROUTINE W REFLEX MICROSCOPIC
Bilirubin Urine: NEGATIVE
Glucose, UA: NEGATIVE mg/dL
Hgb urine dipstick: NEGATIVE
Ketones, ur: NEGATIVE mg/dL
Leukocytes,Ua: NEGATIVE
Nitrite: NEGATIVE
Protein, ur: NEGATIVE mg/dL
Specific Gravity, Urine: 1.002 — ABNORMAL LOW (ref 1.005–1.030)
pH: 6 (ref 5.0–8.0)

## 2019-11-25 LAB — COMPREHENSIVE METABOLIC PANEL
ALT: 13 U/L (ref 0–44)
AST: 16 U/L (ref 15–41)
Albumin: 4.2 g/dL (ref 3.5–5.0)
Alkaline Phosphatase: 74 U/L (ref 38–126)
Anion gap: 10 (ref 5–15)
BUN: 12 mg/dL (ref 8–23)
CO2: 26 mmol/L (ref 22–32)
Calcium: 9.5 mg/dL (ref 8.9–10.3)
Chloride: 105 mmol/L (ref 98–111)
Creatinine, Ser: 0.73 mg/dL (ref 0.44–1.00)
GFR calc Af Amer: >60 mL/min (ref >60)
GFR calc non Af Amer: >60 mL/min (ref >60)
Glucose, Bld: 108 mg/dL — ABNORMAL HIGH (ref 70–99)
Potassium: 3.7 mmol/L (ref 3.5–5.1)
Sodium: 141 mmol/L (ref 135–145)
Total Bilirubin: 0.3 mg/dL (ref 0.3–1.2)
Total Protein: 7 g/dL (ref 6.5–8.1)

## 2019-11-25 LAB — CBC WITH DIFFERENTIAL/PLATELET
Abs Immature Granulocytes: 0.03 10*3/uL (ref 0.00–0.07)
Basophils Absolute: 0.1 10*3/uL (ref 0.0–0.1)
Basophils Relative: 1 %
Eosinophils Absolute: 0.2 10*3/uL (ref 0.0–0.5)
Eosinophils Relative: 3 %
HCT: 41.4 % (ref 36.0–46.0)
Hemoglobin: 13.2 g/dL (ref 12.0–15.0)
Immature Granulocytes: 0 %
Lymphocytes Relative: 32 %
Lymphs Abs: 2.5 10*3/uL (ref 0.7–4.0)
MCH: 30.3 pg (ref 26.0–34.0)
MCHC: 31.9 g/dL (ref 30.0–36.0)
MCV: 95.2 fL (ref 80.0–100.0)
Monocytes Absolute: 0.5 10*3/uL (ref 0.1–1.0)
Monocytes Relative: 7 %
Neutro Abs: 4.6 10*3/uL (ref 1.7–7.7)
Neutrophils Relative %: 57 %
Platelets: 236 10*3/uL (ref 150–400)
RBC: 4.35 MIL/uL (ref 3.87–5.11)
RDW: 12.6 % (ref 11.5–15.5)
WBC: 7.9 10*3/uL (ref 4.0–10.5)
nRBC: 0 % (ref 0.0–0.2)

## 2019-11-25 LAB — SARS CORONAVIRUS 2 BY RT PCR (HOSPITAL ORDER, PERFORMED IN ~~LOC~~ HOSPITAL LAB): SARS Coronavirus 2: NEGATIVE

## 2019-11-25 LAB — RAPID URINE DRUG SCREEN, HOSP PERFORMED
Amphetamines: NOT DETECTED
Barbiturates: NOT DETECTED
Benzodiazepines: NOT DETECTED
Cocaine: NOT DETECTED
Opiates: NOT DETECTED
Tetrahydrocannabinol: NOT DETECTED

## 2019-11-25 LAB — ETHANOL: Alcohol, Ethyl (B): <10 mg/dL (ref <10)

## 2019-11-25 NOTE — ED Notes (Signed)
APS called and gave her contact information and wanted treatment team to know she is involved in placing the pt to a facility. Antonietta Breach, [phone], ext [phone].

## 2019-11-25 NOTE — TOC Initial Note (Signed)
Transition of Care [facility]) - Initial/Assessment Note   Patient Details  Name: Paula Andrews MRN: [id number] Date of Birth: 06-29-1945  Transition of [facility] CM/SW Contact:    Sherie Don, LCSW Phone Number: 11/25/2019, 2:01 PM  Clinical Narrative: Patient is a 74 year old female who presented to the ED under IVC by her family due to increased wandering as a result of her dementia. CSW spoke with patient's DSS worker, [place], to discuss placement. Per Ms. Kayleen Memos, DSS is trying to place the patient at [facility] and an [facility] has been given to [facility] with [employer]. Ms. Kayleen Memos reported the patient's husband will private-pay for the patient's placement as the patient's dementia is worsening resulting in her wandering from the house daily and wearing winter clothing in the heat.  CSW received call from Ms. Darrick Grinder. Ms. Darrick Grinder stated the care manager for [facility], Otila Kluver, will be coming to the ED on 11/26/19 in the morning to assess the patient. Patient will be accepted to [facility] pending the outcome of the assessment and the TB skin test. TOC to follow.  Expected Discharge Plan: Group Home Barriers to Discharge: ED Awaiting Psych Clearance, ED Psych evaluation, ED Patient Insisting on an Alternate Living Situation/Facility  Patient Goals and CMS Choice Patient states their goals for this hospitalization and ongoing recovery are:: Discharge to [facility] after being assessed by [facility] CMS Medicare.gov Compare Post Acute Care list provided to:: Patient Represenative (must comment) [place]) Choice offered to / list presented to : Spouse  Expected Discharge Plan and Services Expected Discharge Plan: Group Home In-house Referral: Clinical Social Work Discharge Planning Services: NA Post Acute Care Choice: Nursing Home [facility]) Living arrangements for the past 2 months: [place]  Prior Living Arrangements/Services Living arrangements for the  past 2 months: Single Family Home Lives with:: Spouse Patient language and need for interpreter reviewed:: Yes Do you feel safe going back to the place where you live?: Yes      Need for Family Participation in Patient Care: Yes (Comment) (Patient has dementia) Care giver support system in place?: Yes (comment) Criminal Activity/Legal Involvement Pertinent to Current Situation/Hospitalization: No - Comment as needed  Permission Sought/Granted Permission sought to share information with : Family Supports, Chartered certified accountant granted to share info w AGENCY: [place]; [place] ([place])  Emotional Assessment Appearance:: Appears stated age Attitude/Demeanor/Rapport: Unable to Assess Affect (typically observed): Unable to Assess Orientation: : Oriented to Self Alcohol / Substance Use: Not Applicable Psych Involvement: No (comment)  Admission diagnosis:  v70.1 Patient Active Problem List   Diagnosis Date Noted  . HCAP (healthcare-associated pneumonia)   . Enterobacter sepsis ([place]) 03/24/2018  . Sepsis due to undetermined organism ([place]) 03/23/2018  . Lobar pneumonia ([place]) 03/23/2018  . Bacteremia due to Gram-negative bacteria 03/23/2018  . Gram negative sepsis ([place]) 03/23/2018  . Pulmonary embolus ([place])   . Acute respiratory failure with hypoxia ([place]) 03/13/2018  . Acute encephalopathy 03/13/2018  . Fall 03/13/2018  . Sinus bradycardia 03/13/2018  . Dementia ([place]) 03/13/2018  . Hypothyroidism 03/13/2018  . Mood disorder ([place]) 03/13/2018  . COPD with acute exacerbation ([place]) 08/07/2017  . 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 Chronic respiratory failure with hypoxia and hypercapnia (HCC)   . Acute respiratory failure with hypoxia and hypercapnia ([place]) 05/30/2017  . Acute lower UTI 05/30/2017  . HTN (hypertension) 05/30/2017  . Diabetes mellitus type  II, controlled ([place]) 05/30/2017  . HLD  (hyperlipidemia) 05/30/2017  . UTI (urinary tract infection) 05/30/2017  . Hypoxia 05/29/2017  . Hyponatremia 05/16/2017  . Fracture of radial neck, left, closed 01/29/2012  . CLOSED FRACTURE UNSPEC PART UPPER END HUMERUS 04/13/2010   PCP:  Lajean Manes, MD Pharmacy:   [facility] [place] [address] [place] [address] Phone: [phone] Fax: [phone]  Readmission Risk Interventions No flowsheet data found.

## 2019-11-25 NOTE — ED Provider Notes (Signed)
Patient's daughter, Mayford Knife, called back.  She reports that patient is unable to be managed at home.  She reports that they have confirmed that she has a room at the [facility] starting Thursday August 26.  However family is unable to care for her until then and is requesting patient be held in the ER until that time.  Will consult case management and social work   Ripley Fraise, MD 11/25/19 [phone]

## 2019-11-25 NOTE — ED Notes (Signed)
Attempted to reach [facility] at [phone], [phone], [phone] with no answer. Also attempted to fax paper work with no success.

## 2019-11-25 NOTE — ED Notes (Signed)
Call with APS worker, Tyron Russell at [phone] ext [phone]; she reports she has been working on an assisted living placement for pt at [facility], she reports pt has been wandering out of her home and has been making suicidal comments; she further reports [facility] is requesting a TB test prior to placement but are anticipating pt to be placed by end of week; APS worker states if pt is not placed by end of the week she will lose the bed at [facility] and is concerned if [facility] recommendation is for inpatient placement the pt will lose her bed; EDP aware
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
## 2019-11-26 DIAGNOSIS — F0390 Unspecified dementia without behavioral disturbance: Secondary | ICD-10-CM | POA: Diagnosis not present

## 2019-11-26 MED ORDER — TUBERCULIN PPD 5 UNIT/0.1ML ID SOLN
5.0000 [IU] | INTRADERMAL | Status: AC
  Administered 2019-11-26: 5 [IU] via INTRADERMAL
  Filled 2019-11-26: qty 0.1

## 2019-11-26 NOTE — ED Notes (Signed)
Pt sleeping at this time. Equal rise and fall of chest noted.

## 2019-11-26 NOTE — ED Notes (Signed)
[facility] at bedside to do assessment on pt.

## 2019-11-26 NOTE — ED Notes (Signed)
Pt resting comfortably at this time.

## 2019-11-26 NOTE — TOC Progression Note (Signed)
Transition of Care [facility]) - Progression Note   Patient Details  Name: Paula Andrews MRN: [id number] Date of Birth: 05-24-1945  Transition of Care [facility]) CM/SW [place], LCSW Phone Number: 11/26/2019, 11:41 AM  Clinical Narrative: CSW received call from [place] with [facility] to inform CSW she is here to assess the patient. CSW notified ED. CSW received call from Billey Co with APS. Per Ms. Kayleen Memos, patient has received both COVID vaccines and provided copy of card. Copy of COVID vaccine card uploaded. Awaiting confirmation from [facility] or DSS regarding placement.   Expected Discharge Plan: Group Home Barriers to Discharge: ED Awaiting Psych Clearance, ED Psych evaluation, ED Patient Insisting on an Alternate Living Situation/Facility  Expected Discharge Plan and Services Expected Discharge Plan: Group Home In-house Referral: Clinical Social Work Discharge Planning Services: NA Post Acute Care Choice: Nursing Home ([place]) Living arrangements for the past 2 months: [place]  Readmission Risk Interventions No flowsheet data found.

## 2019-11-27 DIAGNOSIS — F0390 Unspecified dementia without behavioral disturbance: Secondary | ICD-10-CM | POA: Diagnosis not present

## 2019-11-27 NOTE — TOC Progression Note (Addendum)
Transition of Care [facility]) - Progression Note   Patient Details  Name: SHANIQUE ASLINGER MRN: [id number] Date of Birth: 1945/10/02  Transition of Care [facility]) CM/SW [place], LCSW Phone Number: 11/27/2019, 2:39 PM  Clinical Narrative: CSW called Billey Co with APS to follow up regarding patient's assessment for [facility]. Per Ms. Kayleen Memos, she has been unable to reach Ardis Hughs with [facility] today. CSW called [facility] and left message for Ardis Hughs and Claiborne Billings, Building services engineer for [facility], requesting call back regarding yesterday's assessment and patient's potential placement.  Addendum: 3:53pm-CSW received return call from [name]. CSW updated Ms. Darrick Grinder regarding the TB skin test and patient's COVID shots. Ms. Darrick Grinder reported the patient's assessment is being reviewed and she will follow up with the patient's DSS worker, Ms. Carrroll.  Expected Discharge Plan: Group Home Barriers to Discharge: ED Awaiting Psych Clearance, ED Psych evaluation, ED Patient Insisting on an Alternate Living Situation/Facility  Expected Discharge Plan and Services Expected Discharge Plan: Group Home In-house Referral: Clinical Social Work Discharge Planning Services: NA Post Acute Care Choice: Nursing Home ([place]) Living arrangements for the past 2 months: [place]  Readmission Risk Interventions No flowsheet data found.

## 2019-11-27 NOTE — ED Provider Notes (Signed)
This patient has been cleared by psychiatry, no longer meets inpatient criteria, should be transferred to outpatient facility in the morning.  I have rescinded the IVC papers   Noemi Chapel, MD 11/27/19 [phone]

## 2019-11-27 NOTE — ED Notes (Signed)
IVC rescinding paperwork faxed to [facility].

## 2019-11-28 DIAGNOSIS — F0390 Unspecified dementia without behavioral disturbance: Secondary | ICD-10-CM | POA: Diagnosis not present

## 2019-11-28 DIAGNOSIS — R45851 Suicidal ideations: Secondary | ICD-10-CM | POA: Diagnosis not present

## 2019-11-28 MED ORDER — METFORMIN HCL 500 MG PO TABS
500.0000 mg | ORAL_TABLET | Freq: Every day | ORAL | 1 refills | Status: DC | PRN
Start: 2019-11-28 — End: 2023-06-19

## 2019-11-28 MED ORDER — ACETAMINOPHEN 325 MG PO TABS: 650.0000 mg | ORAL_TABLET | Freq: Two times a day (BID) | ORAL | 1 refills | Status: AC | PRN

## 2019-11-28 MED ORDER — METOPROLOL TARTRATE 25 MG PO TABS: 25.0000 mg | ORAL_TABLET | Freq: Two times a day (BID) | ORAL | 1 refills | Status: DC

## 2019-11-28 MED ORDER — PRAVASTATIN SODIUM 20 MG PO TABS
20.0000 mg | ORAL_TABLET | Freq: Every morning | ORAL | 1 refills | Status: DC
Start: 2019-11-28 — End: 2021-09-19

## 2019-11-28 MED ORDER — BUSPIRONE HCL 10 MG PO TABS
10.0000 mg | ORAL_TABLET | Freq: Three times a day (TID) | ORAL | 1 refills | Status: DC | PRN
Start: 2019-11-28 — End: 2021-09-19

## 2019-11-28 MED ORDER — AMLODIPINE BESYLATE 5 MG PO TABS
5.0000 mg | ORAL_TABLET | Freq: Every day | ORAL | 1 refills | Status: DC
Start: 2019-11-28 — End: 2021-09-19

## 2019-11-28 MED ORDER — LEVOTHYROXINE SODIUM 75 MCG PO TABS: 75.0000 ug | ORAL_TABLET | Freq: Every day | ORAL | 1 refills | Status: DC

## 2019-11-28 MED ORDER — QUETIAPINE FUMARATE 25 MG PO TABS: 25.0000 mg | ORAL_TABLET | Freq: Every evening | ORAL | 0 refills | Status: DC | PRN

## 2019-11-28 MED ORDER — MEMANTINE HCL ER 21 MG PO CP24: 21.0000 mg | ORAL_CAPSULE | Freq: Every day | ORAL | 1 refills | Status: DC

## 2019-11-28 MED ORDER — LISINOPRIL 5 MG PO TABS
5.0000 mg | ORAL_TABLET | Freq: Every day | ORAL | 1 refills | Status: DC
Start: 2019-11-28 — End: 2023-06-19

## 2019-11-28 MED ORDER — FLUTICASONE-SALMETEROL 250-50 MCG/DOSE IN AEPB: 1.0000 | INHALATION_SPRAY | Freq: Two times a day (BID) | RESPIRATORY_TRACT | 2 refills | Status: DC

## 2019-11-28 MED ORDER — DONEPEZIL HCL 10 MG PO TABS
10.0000 mg | ORAL_TABLET | Freq: Every day | ORAL | 1 refills | Status: DC
Start: 2019-11-28 — End: 2021-09-19

## 2019-11-28 MED ORDER — PANTOPRAZOLE SODIUM 40 MG PO TBEC
40.0000 mg | DELAYED_RELEASE_TABLET | Freq: Every day | ORAL | 1 refills | Status: DC
Start: 2019-11-28 — End: 2021-09-19

## 2019-11-28 MED ORDER — ALBUTEROL SULFATE HFA 108 (90 BASE) MCG/ACT IN AERS: 2.0000 | INHALATION_SPRAY | Freq: Four times a day (QID) | RESPIRATORY_TRACT | 1 refills | Status: DC | PRN

## 2019-11-28 NOTE — ED Notes (Signed)
Have called [facility] and they will arrange transport for patient.

## 2019-11-28 NOTE — ED Notes (Signed)
[name]

## 2019-11-28 NOTE — Progress Notes (Signed)
PPD negative

## 2019-11-28 NOTE — ED Provider Notes (Signed)
Emergency Medicine Observation Re-evaluation Note  Paula Andrews is a 74 y.o. female, seen on rounds today.  Pt initially presented to the ED for complaints of Suicidal Currently, the patient is doing well this morning, cooperative, no complaints, not suicidal.  Physical Exam  BP (!) 129/55 (BP Location: Left Arm)   Pulse 78   Temp 99 F (37.2 C) (Oral)   Resp 16   Ht 5\' 4"  (1.626 m)   Wt 63.5 kg   SpO2 96%   BMI 24.03 kg/m  Physical Exam General: Overall well-appearing Cardiac: Normal heart rate Lungs: Normal work of breathing Skin: No erythema on bilateral arms, negative TB test Psych: Stable, no agitation, not suicidal  ED Course / MDM  EKG:    I have reviewed the labs performed to date as well as medications administered while in observation.  Recent changes in the last 24 hours include IVC rescinded.  Plan  Current plan is for outpatient facility and follow-up. Patient is not under full IVC at this time.   Elnora Morrison, MD 11/28/19 1045

## 2019-11-28 NOTE — ED Notes (Signed)
Have provided all belongings to transport driver as well as prescriptions.

## 2019-11-28 NOTE — ED Notes (Signed)
TB: Negative

## 2019-11-28 NOTE — Discharge Instructions (Addendum)
Follow up as directed

## 2019-11-28 NOTE — TOC Transition Note (Addendum)
Transition of Care [facility]) - CM/SW Discharge Note  Patient Details  Name: Paula Andrews MRN: [id number] Date of Birth: [birth year]  Transition of Care [facility]) CM/SW Contact:  Sherie Don, LCSW Phone Number: 11/28/2019, 9:51 AM  Clinical Narrative: CSW spoke with patient's DSS caseworker, Billey Co, regarding placement at [facility]. Ms. Kayleen Memos reported the patient can be picked up by [facility] once her PPD skin test has been read and the results documented. Ms. Kayleen Memos stated she is taking the paperwork to be signed by patient's husband, Paula Andrews, which will then be faxed to [facility]. Ms. Kayleen Memos requested that the ED call [facility] [phone]) once the PPD test is done for transportation. CSW updated ED RN. CSW called patient's husband to notify him of pending discharge/transfer to [facility]. Husband verbalized understanding. TOC signing off.  Addendum: 10:29am-CSW received call from Ardis Hughs with [facility]. Ms. Darrick Grinder requested copies of COVID vaccince card, PPD results, and insurance cards. Ms. Darrick Grinder stated transportation will pick up the patient within the hour. Requested documents faxed to [facility].  Final next level of care: Other (comment) ([place]) Barriers to Discharge: ED Barriers Resolved  Patient Goals and CMS Choice Patient states their goals for this hospitalization and ongoing recovery are:: Discharge to [facility] Medicare.gov Compare Post Acute Care list provided to:: Other (Comment Required) Billey Co (APS caseworker)) Choice offered to / list presented to : Spouse  Discharge Placement Patient to be transferred to facility by: [facility] Name of family member notified: Paula Andrews (husband) Patient and family notified of of transfer: 11/28/19  Discharge Plan and Services In-house Referral: Clinical Social Work Discharge Planning Services: NA Post Acute Care Choice: Nursing Home [facility])           DME Arranged: N/A DME Agency: NA HH Arranged: NA [place] Agency: NA  Readmission Risk Interventions No flowsheet data found.

## 2019-12-03 DIAGNOSIS — F339 Major depressive disorder, recurrent, unspecified: Secondary | ICD-10-CM | POA: Diagnosis not present

## 2019-12-03 DIAGNOSIS — I10 Essential (primary) hypertension: Secondary | ICD-10-CM | POA: Diagnosis not present

## 2019-12-03 DIAGNOSIS — E7800 Pure hypercholesterolemia, unspecified: Secondary | ICD-10-CM | POA: Diagnosis not present

## 2019-12-03 DIAGNOSIS — F0281 Dementia in other diseases classified elsewhere with behavioral disturbance: Secondary | ICD-10-CM | POA: Diagnosis not present

## 2019-12-03 DIAGNOSIS — G301 Alzheimer's disease with late onset: Secondary | ICD-10-CM | POA: Diagnosis not present

## 2019-12-03 DIAGNOSIS — E119 Type 2 diabetes mellitus without complications: Secondary | ICD-10-CM | POA: Diagnosis not present

## 2019-12-03 DIAGNOSIS — J438 Other emphysema: Secondary | ICD-10-CM | POA: Diagnosis not present

## 2019-12-03 DIAGNOSIS — E039 Hypothyroidism, unspecified: Secondary | ICD-10-CM | POA: Diagnosis not present

## 2019-12-03 DIAGNOSIS — F0280 Dementia in other diseases classified elsewhere without behavioral disturbance: Secondary | ICD-10-CM | POA: Diagnosis not present

## 2019-12-03 DIAGNOSIS — E1169 Type 2 diabetes mellitus with other specified complication: Secondary | ICD-10-CM | POA: Diagnosis not present

## 2019-12-05 DIAGNOSIS — E039 Hypothyroidism, unspecified: Secondary | ICD-10-CM | POA: Diagnosis not present

## 2019-12-05 DIAGNOSIS — I10 Essential (primary) hypertension: Secondary | ICD-10-CM | POA: Diagnosis not present

## 2019-12-05 DIAGNOSIS — F0150 Vascular dementia without behavioral disturbance: Secondary | ICD-10-CM | POA: Diagnosis not present

## 2019-12-05 DIAGNOSIS — I674 Hypertensive encephalopathy: Secondary | ICD-10-CM | POA: Diagnosis not present

## 2019-12-05 DIAGNOSIS — E119 Type 2 diabetes mellitus without complications: Secondary | ICD-10-CM | POA: Diagnosis not present

## 2019-12-05 DIAGNOSIS — E782 Mixed hyperlipidemia: Secondary | ICD-10-CM | POA: Diagnosis not present

## 2019-12-06 DIAGNOSIS — F339 Major depressive disorder, recurrent, unspecified: Secondary | ICD-10-CM | POA: Diagnosis not present

## 2019-12-06 DIAGNOSIS — F0391 Unspecified dementia with behavioral disturbance: Secondary | ICD-10-CM | POA: Diagnosis not present

## 2019-12-10 DIAGNOSIS — E1165 Type 2 diabetes mellitus with hyperglycemia: Secondary | ICD-10-CM | POA: Diagnosis not present

## 2019-12-10 DIAGNOSIS — F0150 Vascular dementia without behavioral disturbance: Secondary | ICD-10-CM | POA: Diagnosis not present

## 2019-12-10 DIAGNOSIS — I10 Essential (primary) hypertension: Secondary | ICD-10-CM | POA: Diagnosis not present

## 2019-12-10 DIAGNOSIS — R251 Tremor, unspecified: Secondary | ICD-10-CM | POA: Diagnosis not present

## 2019-12-10 DIAGNOSIS — K219 Gastro-esophageal reflux disease without esophagitis: Secondary | ICD-10-CM | POA: Diagnosis not present

## 2019-12-11 DIAGNOSIS — E782 Mixed hyperlipidemia: Secondary | ICD-10-CM | POA: Diagnosis not present

## 2019-12-11 DIAGNOSIS — I10 Essential (primary) hypertension: Secondary | ICD-10-CM | POA: Diagnosis not present

## 2019-12-11 DIAGNOSIS — E119 Type 2 diabetes mellitus without complications: Secondary | ICD-10-CM | POA: Diagnosis not present

## 2019-12-11 DIAGNOSIS — E039 Hypothyroidism, unspecified: Secondary | ICD-10-CM | POA: Diagnosis not present

## 2019-12-11 DIAGNOSIS — I674 Hypertensive encephalopathy: Secondary | ICD-10-CM | POA: Diagnosis not present

## 2019-12-11 DIAGNOSIS — F0150 Vascular dementia without behavioral disturbance: Secondary | ICD-10-CM | POA: Diagnosis not present

## 2019-12-15 DIAGNOSIS — E782 Mixed hyperlipidemia: Secondary | ICD-10-CM | POA: Diagnosis not present

## 2019-12-15 DIAGNOSIS — E039 Hypothyroidism, unspecified: Secondary | ICD-10-CM | POA: Diagnosis not present

## 2019-12-15 DIAGNOSIS — I10 Essential (primary) hypertension: Secondary | ICD-10-CM | POA: Diagnosis not present

## 2019-12-15 DIAGNOSIS — F0150 Vascular dementia without behavioral disturbance: Secondary | ICD-10-CM | POA: Diagnosis not present

## 2019-12-15 DIAGNOSIS — E119 Type 2 diabetes mellitus without complications: Secondary | ICD-10-CM | POA: Diagnosis not present

## 2019-12-15 DIAGNOSIS — I674 Hypertensive encephalopathy: Secondary | ICD-10-CM | POA: Diagnosis not present

## 2019-12-19 DIAGNOSIS — D638 Anemia in other chronic diseases classified elsewhere: Secondary | ICD-10-CM | POA: Diagnosis not present

## 2019-12-19 DIAGNOSIS — E1165 Type 2 diabetes mellitus with hyperglycemia: Secondary | ICD-10-CM | POA: Diagnosis not present

## 2019-12-19 DIAGNOSIS — Z79899 Other long term (current) drug therapy: Secondary | ICD-10-CM | POA: Diagnosis not present

## 2019-12-19 DIAGNOSIS — M810 Age-related osteoporosis without current pathological fracture: Secondary | ICD-10-CM | POA: Diagnosis not present

## 2019-12-19 DIAGNOSIS — I5032 Chronic diastolic (congestive) heart failure: Secondary | ICD-10-CM | POA: Diagnosis not present

## 2019-12-23 DIAGNOSIS — B351 Tinea unguium: Secondary | ICD-10-CM | POA: Diagnosis not present

## 2019-12-23 DIAGNOSIS — M79675 Pain in left toe(s): Secondary | ICD-10-CM | POA: Diagnosis not present

## 2019-12-23 DIAGNOSIS — M79674 Pain in right toe(s): Secondary | ICD-10-CM | POA: Diagnosis not present

## 2019-12-31 DIAGNOSIS — E785 Hyperlipidemia, unspecified: Secondary | ICD-10-CM | POA: Diagnosis not present

## 2019-12-31 DIAGNOSIS — D509 Iron deficiency anemia, unspecified: Secondary | ICD-10-CM | POA: Diagnosis not present

## 2019-12-31 DIAGNOSIS — E1165 Type 2 diabetes mellitus with hyperglycemia: Secondary | ICD-10-CM | POA: Diagnosis not present

## 2019-12-31 DIAGNOSIS — R946 Abnormal results of thyroid function studies: Secondary | ICD-10-CM | POA: Diagnosis not present

## 2020-01-02 DIAGNOSIS — R946 Abnormal results of thyroid function studies: Secondary | ICD-10-CM | POA: Diagnosis not present

## 2020-01-04 DIAGNOSIS — F339 Major depressive disorder, recurrent, unspecified: Secondary | ICD-10-CM | POA: Diagnosis not present

## 2020-01-04 DIAGNOSIS — F0391 Unspecified dementia with behavioral disturbance: Secondary | ICD-10-CM | POA: Diagnosis not present

## 2020-01-17 DIAGNOSIS — F411 Generalized anxiety disorder: Secondary | ICD-10-CM | POA: Diagnosis not present

## 2020-01-17 DIAGNOSIS — R419 Unspecified symptoms and signs involving cognitive functions and awareness: Secondary | ICD-10-CM | POA: Diagnosis not present

## 2020-01-17 DIAGNOSIS — F251 Schizoaffective disorder, depressive type: Secondary | ICD-10-CM | POA: Diagnosis not present

## 2020-01-27 DIAGNOSIS — E119 Type 2 diabetes mellitus without complications: Secondary | ICD-10-CM | POA: Diagnosis not present

## 2020-01-27 DIAGNOSIS — G301 Alzheimer's disease with late onset: Secondary | ICD-10-CM | POA: Diagnosis not present

## 2020-01-27 DIAGNOSIS — E7800 Pure hypercholesterolemia, unspecified: Secondary | ICD-10-CM | POA: Diagnosis not present

## 2020-01-27 DIAGNOSIS — E1169 Type 2 diabetes mellitus with other specified complication: Secondary | ICD-10-CM | POA: Diagnosis not present

## 2020-01-27 DIAGNOSIS — E039 Hypothyroidism, unspecified: Secondary | ICD-10-CM | POA: Diagnosis not present

## 2020-01-27 DIAGNOSIS — J438 Other emphysema: Secondary | ICD-10-CM | POA: Diagnosis not present

## 2020-01-27 DIAGNOSIS — I10 Essential (primary) hypertension: Secondary | ICD-10-CM | POA: Diagnosis not present

## 2020-02-03 DIAGNOSIS — G301 Alzheimer's disease with late onset: Secondary | ICD-10-CM | POA: Diagnosis not present

## 2020-02-03 DIAGNOSIS — E119 Type 2 diabetes mellitus without complications: Secondary | ICD-10-CM | POA: Diagnosis not present

## 2020-02-03 DIAGNOSIS — E1169 Type 2 diabetes mellitus with other specified complication: Secondary | ICD-10-CM | POA: Diagnosis not present

## 2020-02-03 DIAGNOSIS — E7800 Pure hypercholesterolemia, unspecified: Secondary | ICD-10-CM | POA: Diagnosis not present

## 2020-02-03 DIAGNOSIS — J438 Other emphysema: Secondary | ICD-10-CM | POA: Diagnosis not present

## 2020-02-03 DIAGNOSIS — F0281 Dementia in other diseases classified elsewhere with behavioral disturbance: Secondary | ICD-10-CM | POA: Diagnosis not present

## 2020-02-03 DIAGNOSIS — I10 Essential (primary) hypertension: Secondary | ICD-10-CM | POA: Diagnosis not present

## 2020-02-03 DIAGNOSIS — E039 Hypothyroidism, unspecified: Secondary | ICD-10-CM | POA: Diagnosis not present

## 2020-02-03 DIAGNOSIS — F0280 Dementia in other diseases classified elsewhere without behavioral disturbance: Secondary | ICD-10-CM | POA: Diagnosis not present

## 2020-02-16 ENCOUNTER — Ambulatory Visit: Payer: Medicare Other

## 2020-02-16 ENCOUNTER — Other Ambulatory Visit: Payer: Medicare Other

## 2020-03-04 DIAGNOSIS — F251 Schizoaffective disorder, depressive type: Secondary | ICD-10-CM | POA: Diagnosis not present

## 2020-03-04 DIAGNOSIS — F411 Generalized anxiety disorder: Secondary | ICD-10-CM | POA: Diagnosis not present

## 2020-03-04 DIAGNOSIS — R419 Unspecified symptoms and signs involving cognitive functions and awareness: Secondary | ICD-10-CM | POA: Diagnosis not present

## 2020-03-17 DIAGNOSIS — E039 Hypothyroidism, unspecified: Secondary | ICD-10-CM | POA: Diagnosis not present

## 2020-03-17 DIAGNOSIS — D509 Iron deficiency anemia, unspecified: Secondary | ICD-10-CM | POA: Diagnosis not present

## 2020-03-17 DIAGNOSIS — E1165 Type 2 diabetes mellitus with hyperglycemia: Secondary | ICD-10-CM | POA: Diagnosis not present

## 2020-03-17 DIAGNOSIS — F0150 Vascular dementia without behavioral disturbance: Secondary | ICD-10-CM | POA: Diagnosis not present

## 2020-03-22 DIAGNOSIS — Z20828 Contact with and (suspected) exposure to other viral communicable diseases: Secondary | ICD-10-CM | POA: Diagnosis not present

## 2020-03-24 DIAGNOSIS — Z79899 Other long term (current) drug therapy: Secondary | ICD-10-CM | POA: Diagnosis not present

## 2020-03-24 DIAGNOSIS — E039 Hypothyroidism, unspecified: Secondary | ICD-10-CM | POA: Diagnosis not present

## 2020-03-24 DIAGNOSIS — E1165 Type 2 diabetes mellitus with hyperglycemia: Secondary | ICD-10-CM | POA: Diagnosis not present

## 2020-03-24 DIAGNOSIS — E559 Vitamin D deficiency, unspecified: Secondary | ICD-10-CM | POA: Diagnosis not present

## 2020-03-24 DIAGNOSIS — D509 Iron deficiency anemia, unspecified: Secondary | ICD-10-CM | POA: Diagnosis not present

## 2020-03-25 DIAGNOSIS — E1165 Type 2 diabetes mellitus with hyperglycemia: Secondary | ICD-10-CM | POA: Diagnosis not present

## 2020-03-25 DIAGNOSIS — Z79899 Other long term (current) drug therapy: Secondary | ICD-10-CM | POA: Diagnosis not present

## 2020-03-25 DIAGNOSIS — D509 Iron deficiency anemia, unspecified: Secondary | ICD-10-CM | POA: Diagnosis not present

## 2020-03-25 DIAGNOSIS — E039 Hypothyroidism, unspecified: Secondary | ICD-10-CM | POA: Diagnosis not present

## 2020-03-29 DIAGNOSIS — Z20828 Contact with and (suspected) exposure to other viral communicable diseases: Secondary | ICD-10-CM | POA: Diagnosis not present

## 2020-04-15 DIAGNOSIS — R419 Unspecified symptoms and signs involving cognitive functions and awareness: Secondary | ICD-10-CM | POA: Diagnosis not present

## 2020-04-15 DIAGNOSIS — F411 Generalized anxiety disorder: Secondary | ICD-10-CM | POA: Diagnosis not present

## 2020-04-15 DIAGNOSIS — F251 Schizoaffective disorder, depressive type: Secondary | ICD-10-CM | POA: Diagnosis not present

## 2020-04-15 DIAGNOSIS — E039 Hypothyroidism, unspecified: Secondary | ICD-10-CM | POA: Diagnosis not present

## 2020-04-16 DIAGNOSIS — E039 Hypothyroidism, unspecified: Secondary | ICD-10-CM | POA: Diagnosis not present

## 2020-04-21 DIAGNOSIS — Z23 Encounter for immunization: Secondary | ICD-10-CM | POA: Diagnosis not present

## 2020-05-05 DIAGNOSIS — F0150 Vascular dementia without behavioral disturbance: Secondary | ICD-10-CM | POA: Diagnosis not present

## 2020-05-05 DIAGNOSIS — R2681 Unsteadiness on feet: Secondary | ICD-10-CM | POA: Diagnosis not present

## 2020-05-05 DIAGNOSIS — E1165 Type 2 diabetes mellitus with hyperglycemia: Secondary | ICD-10-CM | POA: Diagnosis not present

## 2020-05-05 DIAGNOSIS — E039 Hypothyroidism, unspecified: Secondary | ICD-10-CM | POA: Diagnosis not present

## 2020-05-14 DIAGNOSIS — E119 Type 2 diabetes mellitus without complications: Secondary | ICD-10-CM | POA: Diagnosis not present

## 2020-05-14 DIAGNOSIS — F0150 Vascular dementia without behavioral disturbance: Secondary | ICD-10-CM | POA: Diagnosis not present

## 2020-05-14 DIAGNOSIS — R2689 Other abnormalities of gait and mobility: Secondary | ICD-10-CM | POA: Diagnosis not present

## 2020-05-14 DIAGNOSIS — M6281 Muscle weakness (generalized): Secondary | ICD-10-CM | POA: Diagnosis not present

## 2020-05-14 DIAGNOSIS — I10 Essential (primary) hypertension: Secondary | ICD-10-CM | POA: Diagnosis not present

## 2020-05-19 DIAGNOSIS — R2689 Other abnormalities of gait and mobility: Secondary | ICD-10-CM | POA: Diagnosis not present

## 2020-05-19 DIAGNOSIS — E1165 Type 2 diabetes mellitus with hyperglycemia: Secondary | ICD-10-CM | POA: Diagnosis not present

## 2020-05-19 DIAGNOSIS — F32A Depression, unspecified: Secondary | ICD-10-CM | POA: Diagnosis not present

## 2020-05-19 DIAGNOSIS — R2681 Unsteadiness on feet: Secondary | ICD-10-CM | POA: Diagnosis not present

## 2020-05-19 DIAGNOSIS — M6281 Muscle weakness (generalized): Secondary | ICD-10-CM | POA: Diagnosis not present

## 2020-05-19 DIAGNOSIS — F0150 Vascular dementia without behavioral disturbance: Secondary | ICD-10-CM | POA: Diagnosis not present

## 2020-05-19 DIAGNOSIS — I10 Essential (primary) hypertension: Secondary | ICD-10-CM | POA: Diagnosis not present

## 2020-05-19 DIAGNOSIS — E119 Type 2 diabetes mellitus without complications: Secondary | ICD-10-CM | POA: Diagnosis not present

## 2020-05-19 DIAGNOSIS — E039 Hypothyroidism, unspecified: Secondary | ICD-10-CM | POA: Diagnosis not present

## 2020-05-20 DIAGNOSIS — F251 Schizoaffective disorder, depressive type: Secondary | ICD-10-CM | POA: Diagnosis not present

## 2020-05-20 DIAGNOSIS — R419 Unspecified symptoms and signs involving cognitive functions and awareness: Secondary | ICD-10-CM | POA: Diagnosis not present

## 2020-05-20 DIAGNOSIS — F411 Generalized anxiety disorder: Secondary | ICD-10-CM | POA: Diagnosis not present

## 2020-05-22 DIAGNOSIS — R2689 Other abnormalities of gait and mobility: Secondary | ICD-10-CM | POA: Diagnosis not present

## 2020-05-22 DIAGNOSIS — I10 Essential (primary) hypertension: Secondary | ICD-10-CM | POA: Diagnosis not present

## 2020-05-22 DIAGNOSIS — E119 Type 2 diabetes mellitus without complications: Secondary | ICD-10-CM | POA: Diagnosis not present

## 2020-05-22 DIAGNOSIS — M6281 Muscle weakness (generalized): Secondary | ICD-10-CM | POA: Diagnosis not present

## 2020-05-22 DIAGNOSIS — F0150 Vascular dementia without behavioral disturbance: Secondary | ICD-10-CM | POA: Diagnosis not present

## 2020-05-25 DIAGNOSIS — M79674 Pain in right toe(s): Secondary | ICD-10-CM | POA: Diagnosis not present

## 2020-05-25 DIAGNOSIS — B351 Tinea unguium: Secondary | ICD-10-CM | POA: Diagnosis not present

## 2020-05-25 DIAGNOSIS — M79675 Pain in left toe(s): Secondary | ICD-10-CM | POA: Diagnosis not present

## 2020-05-26 DIAGNOSIS — R2689 Other abnormalities of gait and mobility: Secondary | ICD-10-CM | POA: Diagnosis not present

## 2020-05-26 DIAGNOSIS — I10 Essential (primary) hypertension: Secondary | ICD-10-CM | POA: Diagnosis not present

## 2020-05-26 DIAGNOSIS — F32A Depression, unspecified: Secondary | ICD-10-CM | POA: Diagnosis not present

## 2020-05-26 DIAGNOSIS — R2681 Unsteadiness on feet: Secondary | ICD-10-CM | POA: Diagnosis not present

## 2020-05-26 DIAGNOSIS — E119 Type 2 diabetes mellitus without complications: Secondary | ICD-10-CM | POA: Diagnosis not present

## 2020-05-26 DIAGNOSIS — E1165 Type 2 diabetes mellitus with hyperglycemia: Secondary | ICD-10-CM | POA: Diagnosis not present

## 2020-05-26 DIAGNOSIS — E039 Hypothyroidism, unspecified: Secondary | ICD-10-CM | POA: Diagnosis not present

## 2020-05-26 DIAGNOSIS — M6281 Muscle weakness (generalized): Secondary | ICD-10-CM | POA: Diagnosis not present

## 2020-05-26 DIAGNOSIS — F0150 Vascular dementia without behavioral disturbance: Secondary | ICD-10-CM | POA: Diagnosis not present

## 2020-05-28 DIAGNOSIS — E039 Hypothyroidism, unspecified: Secondary | ICD-10-CM | POA: Diagnosis not present

## 2020-05-31 DIAGNOSIS — R2689 Other abnormalities of gait and mobility: Secondary | ICD-10-CM | POA: Diagnosis not present

## 2020-05-31 DIAGNOSIS — F0150 Vascular dementia without behavioral disturbance: Secondary | ICD-10-CM | POA: Diagnosis not present

## 2020-05-31 DIAGNOSIS — I10 Essential (primary) hypertension: Secondary | ICD-10-CM | POA: Diagnosis not present

## 2020-05-31 DIAGNOSIS — M6281 Muscle weakness (generalized): Secondary | ICD-10-CM | POA: Diagnosis not present

## 2020-05-31 DIAGNOSIS — E119 Type 2 diabetes mellitus without complications: Secondary | ICD-10-CM | POA: Diagnosis not present

## 2020-06-03 DIAGNOSIS — F0150 Vascular dementia without behavioral disturbance: Secondary | ICD-10-CM | POA: Diagnosis not present

## 2020-06-03 DIAGNOSIS — R2689 Other abnormalities of gait and mobility: Secondary | ICD-10-CM | POA: Diagnosis not present

## 2020-06-03 DIAGNOSIS — E119 Type 2 diabetes mellitus without complications: Secondary | ICD-10-CM | POA: Diagnosis not present

## 2020-06-03 DIAGNOSIS — I10 Essential (primary) hypertension: Secondary | ICD-10-CM | POA: Diagnosis not present

## 2020-06-03 DIAGNOSIS — M6281 Muscle weakness (generalized): Secondary | ICD-10-CM | POA: Diagnosis not present

## 2020-06-04 DIAGNOSIS — Z79899 Other long term (current) drug therapy: Secondary | ICD-10-CM | POA: Diagnosis not present

## 2020-06-05 DIAGNOSIS — I10 Essential (primary) hypertension: Secondary | ICD-10-CM | POA: Diagnosis not present

## 2020-06-05 DIAGNOSIS — M6281 Muscle weakness (generalized): Secondary | ICD-10-CM | POA: Diagnosis not present

## 2020-06-05 DIAGNOSIS — F0150 Vascular dementia without behavioral disturbance: Secondary | ICD-10-CM | POA: Diagnosis not present

## 2020-06-05 DIAGNOSIS — E119 Type 2 diabetes mellitus without complications: Secondary | ICD-10-CM | POA: Diagnosis not present

## 2020-06-05 DIAGNOSIS — R2689 Other abnormalities of gait and mobility: Secondary | ICD-10-CM | POA: Diagnosis not present

## 2020-06-09 DIAGNOSIS — R2681 Unsteadiness on feet: Secondary | ICD-10-CM | POA: Diagnosis not present

## 2020-06-09 DIAGNOSIS — E1165 Type 2 diabetes mellitus with hyperglycemia: Secondary | ICD-10-CM | POA: Diagnosis not present

## 2020-06-09 DIAGNOSIS — E039 Hypothyroidism, unspecified: Secondary | ICD-10-CM | POA: Diagnosis not present

## 2020-06-09 DIAGNOSIS — F32A Depression, unspecified: Secondary | ICD-10-CM | POA: Diagnosis not present

## 2020-06-09 DIAGNOSIS — F0150 Vascular dementia without behavioral disturbance: Secondary | ICD-10-CM | POA: Diagnosis not present

## 2020-06-10 DIAGNOSIS — F251 Schizoaffective disorder, depressive type: Secondary | ICD-10-CM | POA: Diagnosis not present

## 2020-06-10 DIAGNOSIS — F411 Generalized anxiety disorder: Secondary | ICD-10-CM | POA: Diagnosis not present

## 2020-06-10 DIAGNOSIS — R419 Unspecified symptoms and signs involving cognitive functions and awareness: Secondary | ICD-10-CM | POA: Diagnosis not present

## 2020-06-11 DIAGNOSIS — E119 Type 2 diabetes mellitus without complications: Secondary | ICD-10-CM | POA: Diagnosis not present

## 2020-06-11 DIAGNOSIS — F0150 Vascular dementia without behavioral disturbance: Secondary | ICD-10-CM | POA: Diagnosis not present

## 2020-06-11 DIAGNOSIS — M6281 Muscle weakness (generalized): Secondary | ICD-10-CM | POA: Diagnosis not present

## 2020-06-11 DIAGNOSIS — R2689 Other abnormalities of gait and mobility: Secondary | ICD-10-CM | POA: Diagnosis not present

## 2020-06-11 DIAGNOSIS — I10 Essential (primary) hypertension: Secondary | ICD-10-CM | POA: Diagnosis not present

## 2020-06-12 DIAGNOSIS — E119 Type 2 diabetes mellitus without complications: Secondary | ICD-10-CM | POA: Diagnosis not present

## 2020-06-12 DIAGNOSIS — I10 Essential (primary) hypertension: Secondary | ICD-10-CM | POA: Diagnosis not present

## 2020-06-12 DIAGNOSIS — F0150 Vascular dementia without behavioral disturbance: Secondary | ICD-10-CM | POA: Diagnosis not present

## 2020-06-12 DIAGNOSIS — M6281 Muscle weakness (generalized): Secondary | ICD-10-CM | POA: Diagnosis not present

## 2020-06-12 DIAGNOSIS — R2689 Other abnormalities of gait and mobility: Secondary | ICD-10-CM | POA: Diagnosis not present

## 2020-06-19 DIAGNOSIS — E119 Type 2 diabetes mellitus without complications: Secondary | ICD-10-CM | POA: Diagnosis not present

## 2020-06-19 DIAGNOSIS — I10 Essential (primary) hypertension: Secondary | ICD-10-CM | POA: Diagnosis not present

## 2020-06-19 DIAGNOSIS — R2689 Other abnormalities of gait and mobility: Secondary | ICD-10-CM | POA: Diagnosis not present

## 2020-06-19 DIAGNOSIS — F0150 Vascular dementia without behavioral disturbance: Secondary | ICD-10-CM | POA: Diagnosis not present

## 2020-06-19 DIAGNOSIS — M6281 Muscle weakness (generalized): Secondary | ICD-10-CM | POA: Diagnosis not present

## 2020-06-23 DIAGNOSIS — F0150 Vascular dementia without behavioral disturbance: Secondary | ICD-10-CM | POA: Diagnosis not present

## 2020-06-23 DIAGNOSIS — M6281 Muscle weakness (generalized): Secondary | ICD-10-CM | POA: Diagnosis not present

## 2020-06-23 DIAGNOSIS — E119 Type 2 diabetes mellitus without complications: Secondary | ICD-10-CM | POA: Diagnosis not present

## 2020-06-23 DIAGNOSIS — I10 Essential (primary) hypertension: Secondary | ICD-10-CM | POA: Diagnosis not present

## 2020-06-23 DIAGNOSIS — R2689 Other abnormalities of gait and mobility: Secondary | ICD-10-CM | POA: Diagnosis not present

## 2020-06-24 IMAGING — MG DIGITAL SCREENING BILATERAL MAMMOGRAM WITH TOMO AND CAD
6 of 10 series · 6 of 30 positions shown · non-contrast
Comparison: Previous exam(s).

CLINICAL DATA: Screening.

EXAM:
DIGITAL SCREENING BILATERAL MAMMOGRAM WITH TOMO AND CAD

[L CC synth-2D]
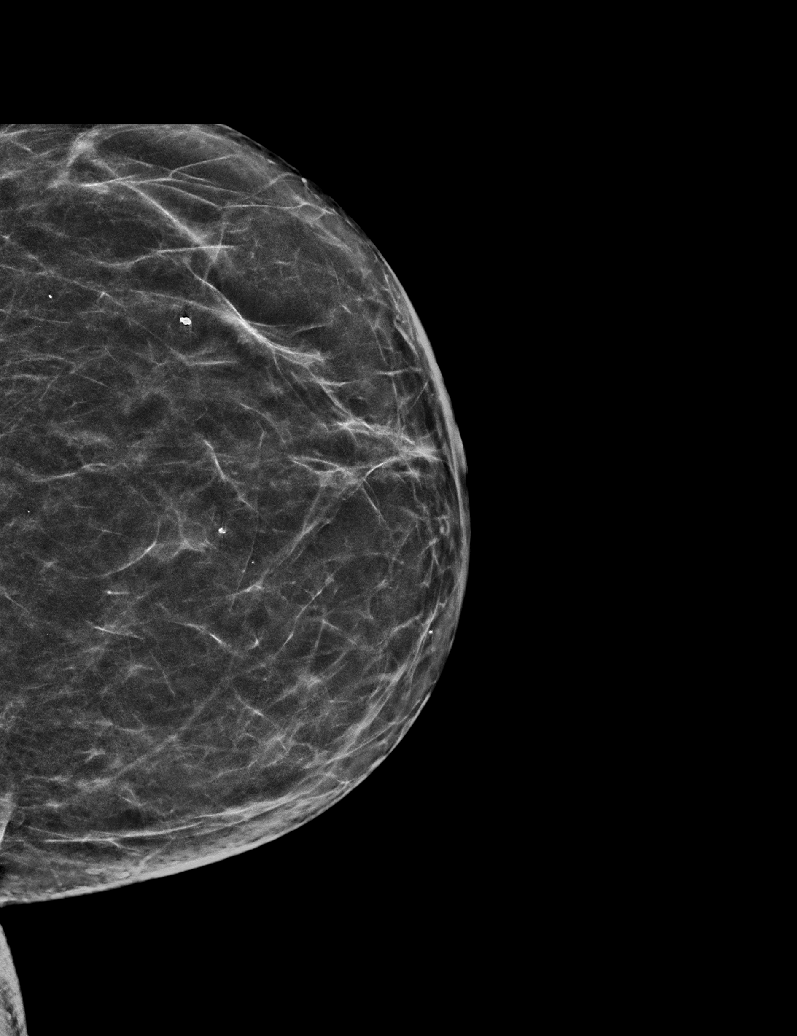

[R MLO synth-2D]
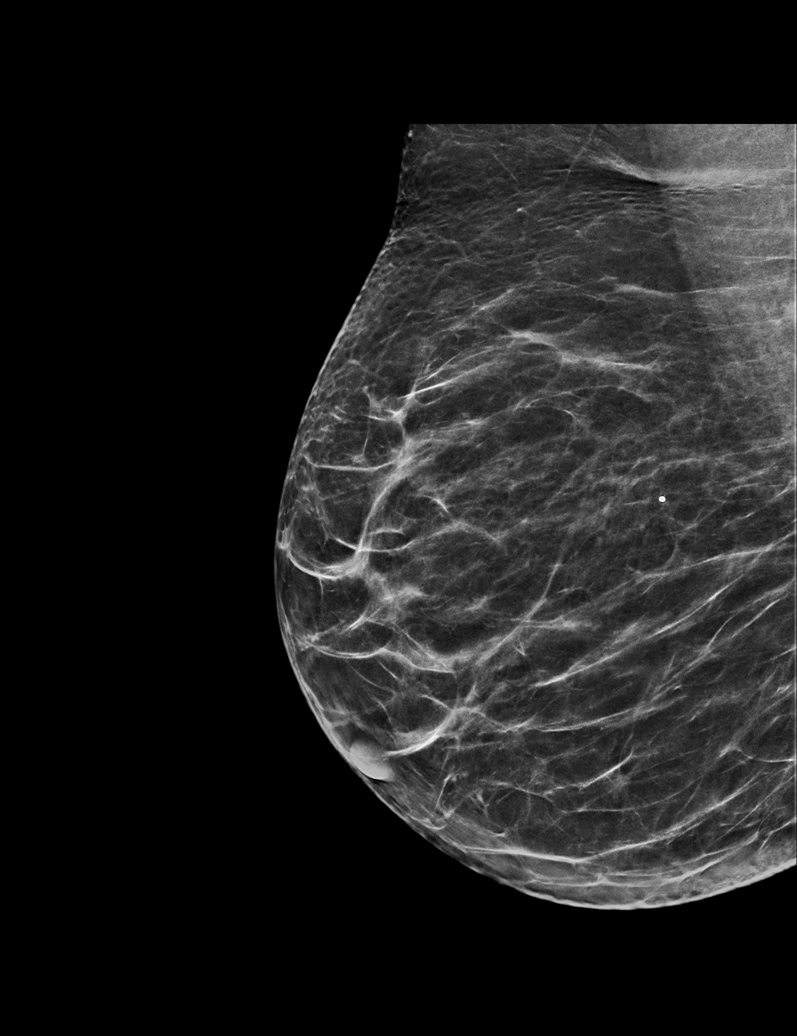

[R CC synth-2D (1 of 2)]
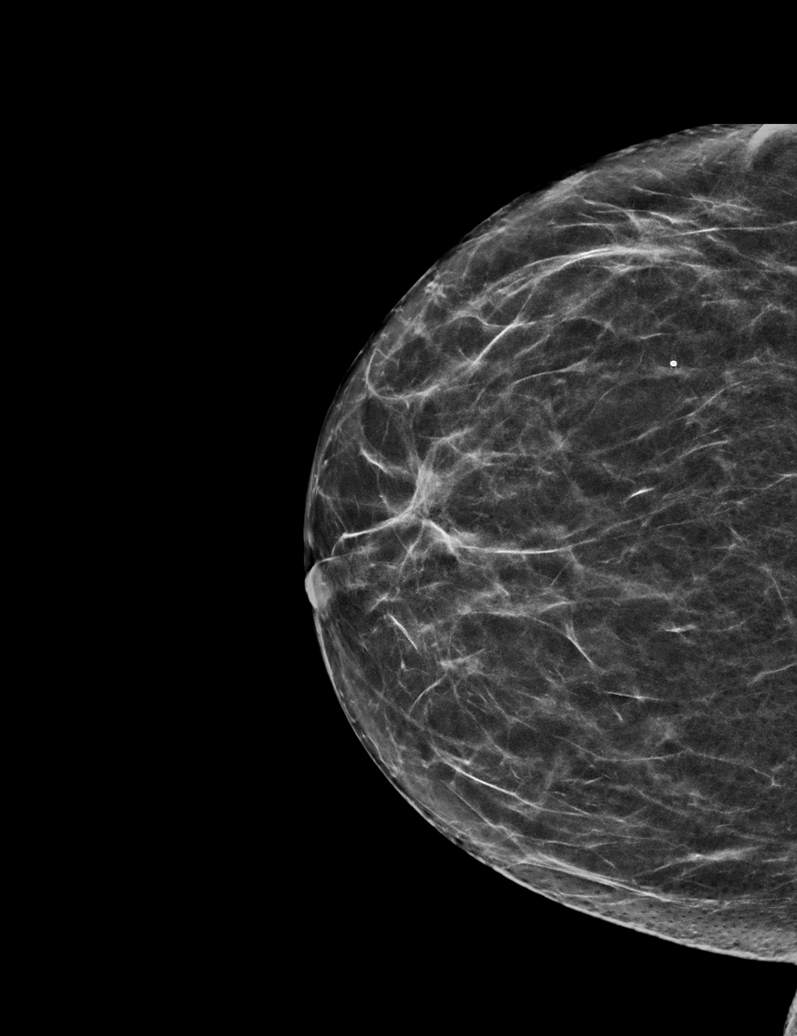

[L MLO synth-2D]
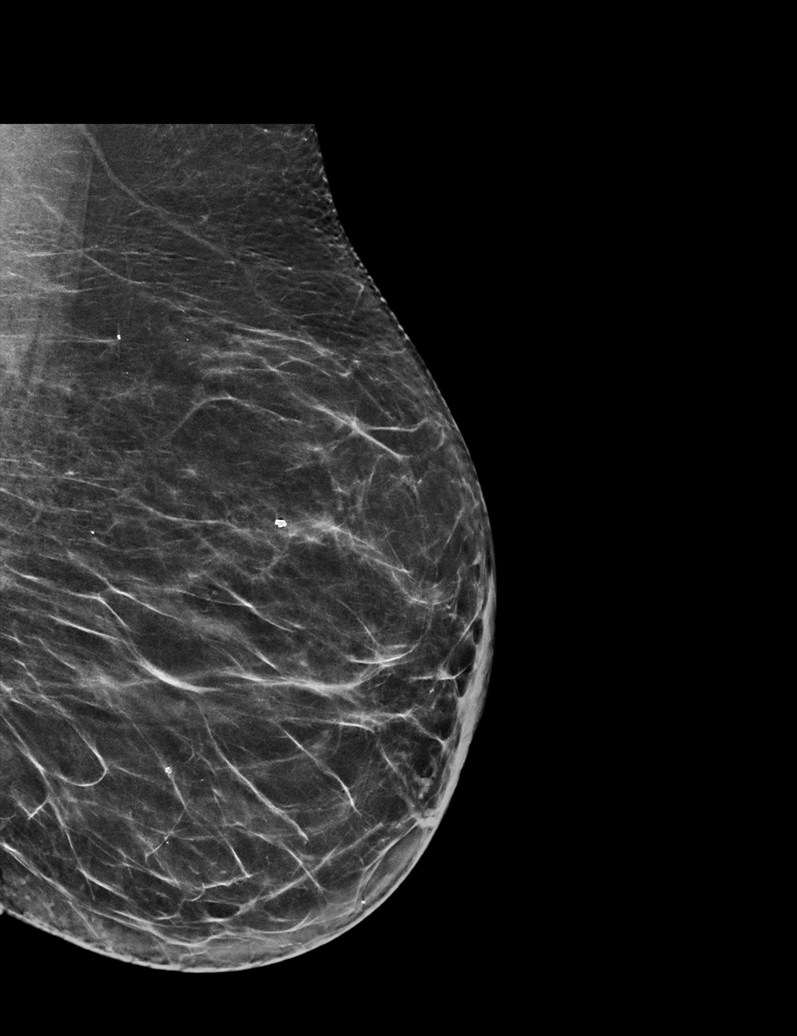

[R CC synth-2D (2 of 2)]
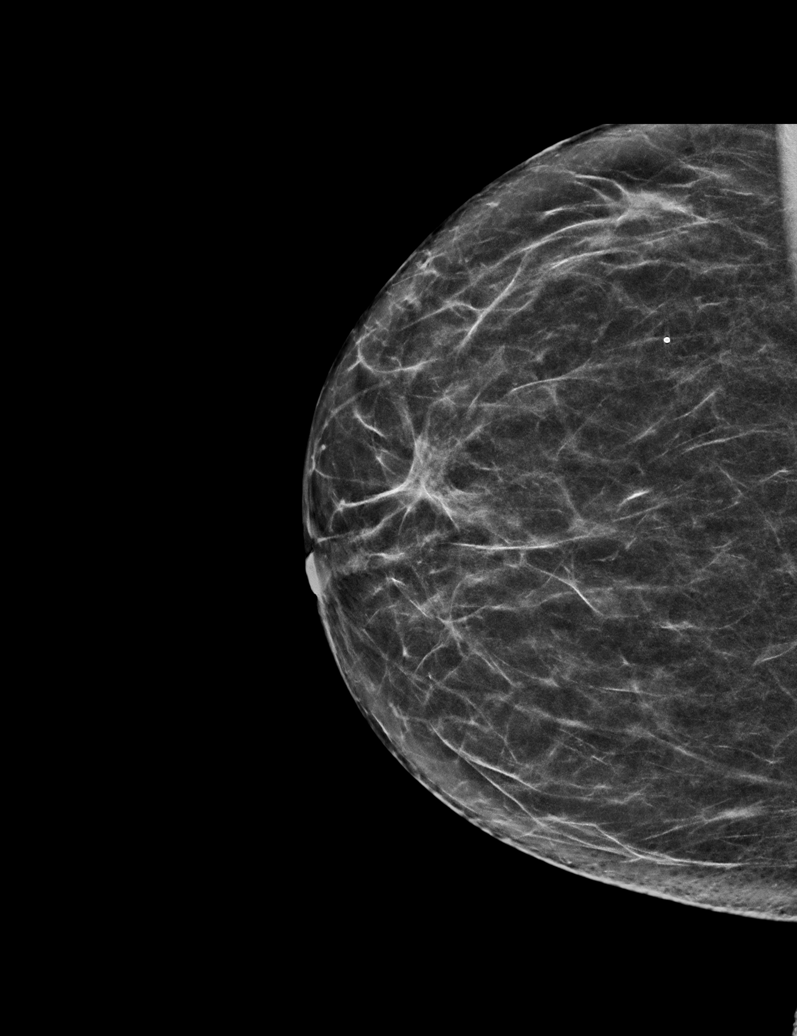

[L CC tomo · tomo slice 29/58.0]
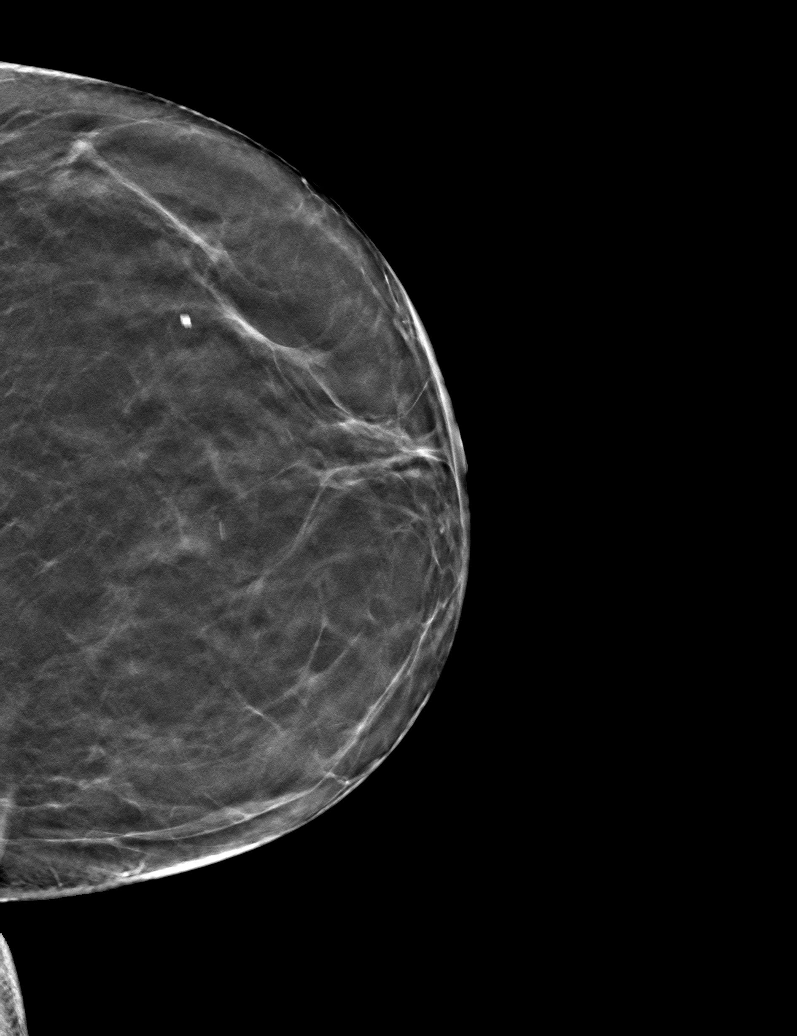

[6 of 30 positions shown; findings below may reference images not displayed]

ACR Breast Density Category b: There are scattered areas of
fibroglandular density.
FINDINGS: There are no findings suspicious for malignancy. Images were
processed with CAD.
IMPRESSION: No mammographic evidence of malignancy. A result letter of this
screening mammogram will be mailed directly to the patient.

RECOMMENDATION:
Screening mammogram in one year. (Code:[id number])

BI-RADS CATEGORY  1: Negative.

## 2020-06-26 DIAGNOSIS — F0150 Vascular dementia without behavioral disturbance: Secondary | ICD-10-CM | POA: Diagnosis not present

## 2020-06-26 DIAGNOSIS — I10 Essential (primary) hypertension: Secondary | ICD-10-CM | POA: Diagnosis not present

## 2020-06-26 DIAGNOSIS — R2689 Other abnormalities of gait and mobility: Secondary | ICD-10-CM | POA: Diagnosis not present

## 2020-06-26 DIAGNOSIS — M6281 Muscle weakness (generalized): Secondary | ICD-10-CM | POA: Diagnosis not present

## 2020-06-26 DIAGNOSIS — E119 Type 2 diabetes mellitus without complications: Secondary | ICD-10-CM | POA: Diagnosis not present

## 2020-06-30 DIAGNOSIS — F0150 Vascular dementia without behavioral disturbance: Secondary | ICD-10-CM | POA: Diagnosis not present

## 2020-06-30 DIAGNOSIS — R2681 Unsteadiness on feet: Secondary | ICD-10-CM | POA: Diagnosis not present

## 2020-06-30 DIAGNOSIS — E1165 Type 2 diabetes mellitus with hyperglycemia: Secondary | ICD-10-CM | POA: Diagnosis not present

## 2020-06-30 DIAGNOSIS — M25562 Pain in left knee: Secondary | ICD-10-CM | POA: Diagnosis not present

## 2020-06-30 DIAGNOSIS — F32A Depression, unspecified: Secondary | ICD-10-CM | POA: Diagnosis not present

## 2020-06-30 DIAGNOSIS — E039 Hypothyroidism, unspecified: Secondary | ICD-10-CM | POA: Diagnosis not present

## 2020-06-30 DIAGNOSIS — G4700 Insomnia, unspecified: Secondary | ICD-10-CM | POA: Diagnosis not present

## 2020-06-30 DIAGNOSIS — R2689 Other abnormalities of gait and mobility: Secondary | ICD-10-CM | POA: Diagnosis not present

## 2020-07-01 DIAGNOSIS — F0150 Vascular dementia without behavioral disturbance: Secondary | ICD-10-CM | POA: Diagnosis not present

## 2020-07-01 DIAGNOSIS — M6281 Muscle weakness (generalized): Secondary | ICD-10-CM | POA: Diagnosis not present

## 2020-07-01 DIAGNOSIS — E119 Type 2 diabetes mellitus without complications: Secondary | ICD-10-CM | POA: Diagnosis not present

## 2020-07-01 DIAGNOSIS — I10 Essential (primary) hypertension: Secondary | ICD-10-CM | POA: Diagnosis not present

## 2020-07-01 DIAGNOSIS — R2689 Other abnormalities of gait and mobility: Secondary | ICD-10-CM | POA: Diagnosis not present

## 2020-07-03 DIAGNOSIS — M25562 Pain in left knee: Secondary | ICD-10-CM | POA: Diagnosis not present

## 2020-07-03 DIAGNOSIS — F0150 Vascular dementia without behavioral disturbance: Secondary | ICD-10-CM | POA: Diagnosis not present

## 2020-07-03 DIAGNOSIS — I10 Essential (primary) hypertension: Secondary | ICD-10-CM | POA: Diagnosis not present

## 2020-07-03 DIAGNOSIS — R2689 Other abnormalities of gait and mobility: Secondary | ICD-10-CM | POA: Diagnosis not present

## 2020-07-03 DIAGNOSIS — M6281 Muscle weakness (generalized): Secondary | ICD-10-CM | POA: Diagnosis not present

## 2020-07-03 DIAGNOSIS — E119 Type 2 diabetes mellitus without complications: Secondary | ICD-10-CM | POA: Diagnosis not present

## 2020-07-07 DIAGNOSIS — R2689 Other abnormalities of gait and mobility: Secondary | ICD-10-CM | POA: Diagnosis not present

## 2020-07-07 DIAGNOSIS — F0150 Vascular dementia without behavioral disturbance: Secondary | ICD-10-CM | POA: Diagnosis not present

## 2020-07-07 DIAGNOSIS — I10 Essential (primary) hypertension: Secondary | ICD-10-CM | POA: Diagnosis not present

## 2020-07-07 DIAGNOSIS — E119 Type 2 diabetes mellitus without complications: Secondary | ICD-10-CM | POA: Diagnosis not present

## 2020-07-07 DIAGNOSIS — M6281 Muscle weakness (generalized): Secondary | ICD-10-CM | POA: Diagnosis not present

## 2020-07-08 DIAGNOSIS — R419 Unspecified symptoms and signs involving cognitive functions and awareness: Secondary | ICD-10-CM | POA: Diagnosis not present

## 2020-07-08 DIAGNOSIS — F411 Generalized anxiety disorder: Secondary | ICD-10-CM | POA: Diagnosis not present

## 2020-07-08 DIAGNOSIS — F251 Schizoaffective disorder, depressive type: Secondary | ICD-10-CM | POA: Diagnosis not present

## 2020-07-09 DIAGNOSIS — M6281 Muscle weakness (generalized): Secondary | ICD-10-CM | POA: Diagnosis not present

## 2020-07-09 DIAGNOSIS — R2689 Other abnormalities of gait and mobility: Secondary | ICD-10-CM | POA: Diagnosis not present

## 2020-07-09 DIAGNOSIS — F0150 Vascular dementia without behavioral disturbance: Secondary | ICD-10-CM | POA: Diagnosis not present

## 2020-07-09 DIAGNOSIS — I10 Essential (primary) hypertension: Secondary | ICD-10-CM | POA: Diagnosis not present

## 2020-07-09 DIAGNOSIS — E119 Type 2 diabetes mellitus without complications: Secondary | ICD-10-CM | POA: Diagnosis not present

## 2020-07-13 DIAGNOSIS — I10 Essential (primary) hypertension: Secondary | ICD-10-CM | POA: Diagnosis not present

## 2020-07-13 DIAGNOSIS — R2689 Other abnormalities of gait and mobility: Secondary | ICD-10-CM | POA: Diagnosis not present

## 2020-07-13 DIAGNOSIS — E119 Type 2 diabetes mellitus without complications: Secondary | ICD-10-CM | POA: Diagnosis not present

## 2020-07-13 DIAGNOSIS — M6281 Muscle weakness (generalized): Secondary | ICD-10-CM | POA: Diagnosis not present

## 2020-07-13 DIAGNOSIS — F0150 Vascular dementia without behavioral disturbance: Secondary | ICD-10-CM | POA: Diagnosis not present

## 2020-07-14 DIAGNOSIS — M25562 Pain in left knee: Secondary | ICD-10-CM | POA: Diagnosis not present

## 2020-07-14 DIAGNOSIS — G4700 Insomnia, unspecified: Secondary | ICD-10-CM | POA: Diagnosis not present

## 2020-07-14 DIAGNOSIS — E1165 Type 2 diabetes mellitus with hyperglycemia: Secondary | ICD-10-CM | POA: Diagnosis not present

## 2020-07-14 DIAGNOSIS — E039 Hypothyroidism, unspecified: Secondary | ICD-10-CM | POA: Diagnosis not present

## 2020-07-14 DIAGNOSIS — R2689 Other abnormalities of gait and mobility: Secondary | ICD-10-CM | POA: Diagnosis not present

## 2020-07-14 DIAGNOSIS — I10 Essential (primary) hypertension: Secondary | ICD-10-CM | POA: Diagnosis not present

## 2020-07-14 DIAGNOSIS — F0150 Vascular dementia without behavioral disturbance: Secondary | ICD-10-CM | POA: Diagnosis not present

## 2020-07-14 DIAGNOSIS — E119 Type 2 diabetes mellitus without complications: Secondary | ICD-10-CM | POA: Diagnosis not present

## 2020-07-14 DIAGNOSIS — M6281 Muscle weakness (generalized): Secondary | ICD-10-CM | POA: Diagnosis not present

## 2020-07-14 DIAGNOSIS — R2681 Unsteadiness on feet: Secondary | ICD-10-CM | POA: Diagnosis not present

## 2020-07-14 DIAGNOSIS — F32A Depression, unspecified: Secondary | ICD-10-CM | POA: Diagnosis not present

## 2020-07-15 DIAGNOSIS — E039 Hypothyroidism, unspecified: Secondary | ICD-10-CM | POA: Diagnosis not present

## 2020-07-20 DIAGNOSIS — I10 Essential (primary) hypertension: Secondary | ICD-10-CM | POA: Diagnosis not present

## 2020-07-20 DIAGNOSIS — M6281 Muscle weakness (generalized): Secondary | ICD-10-CM | POA: Diagnosis not present

## 2020-07-20 DIAGNOSIS — F0150 Vascular dementia without behavioral disturbance: Secondary | ICD-10-CM | POA: Diagnosis not present

## 2020-07-20 DIAGNOSIS — R2689 Other abnormalities of gait and mobility: Secondary | ICD-10-CM | POA: Diagnosis not present

## 2020-07-20 DIAGNOSIS — E119 Type 2 diabetes mellitus without complications: Secondary | ICD-10-CM | POA: Diagnosis not present

## 2020-07-21 DIAGNOSIS — E119 Type 2 diabetes mellitus without complications: Secondary | ICD-10-CM | POA: Diagnosis not present

## 2020-07-21 DIAGNOSIS — F0150 Vascular dementia without behavioral disturbance: Secondary | ICD-10-CM | POA: Diagnosis not present

## 2020-07-21 DIAGNOSIS — M6281 Muscle weakness (generalized): Secondary | ICD-10-CM | POA: Diagnosis not present

## 2020-07-21 DIAGNOSIS — R2689 Other abnormalities of gait and mobility: Secondary | ICD-10-CM | POA: Diagnosis not present

## 2020-07-21 DIAGNOSIS — I10 Essential (primary) hypertension: Secondary | ICD-10-CM | POA: Diagnosis not present

## 2020-07-22 DIAGNOSIS — E119 Type 2 diabetes mellitus without complications: Secondary | ICD-10-CM | POA: Diagnosis not present

## 2020-07-22 DIAGNOSIS — F0150 Vascular dementia without behavioral disturbance: Secondary | ICD-10-CM | POA: Diagnosis not present

## 2020-07-22 DIAGNOSIS — I10 Essential (primary) hypertension: Secondary | ICD-10-CM | POA: Diagnosis not present

## 2020-07-22 DIAGNOSIS — R2689 Other abnormalities of gait and mobility: Secondary | ICD-10-CM | POA: Diagnosis not present

## 2020-07-22 DIAGNOSIS — M6281 Muscle weakness (generalized): Secondary | ICD-10-CM | POA: Diagnosis not present

## 2020-07-24 DIAGNOSIS — I10 Essential (primary) hypertension: Secondary | ICD-10-CM | POA: Diagnosis not present

## 2020-07-24 DIAGNOSIS — F0150 Vascular dementia without behavioral disturbance: Secondary | ICD-10-CM | POA: Diagnosis not present

## 2020-07-24 DIAGNOSIS — R2689 Other abnormalities of gait and mobility: Secondary | ICD-10-CM | POA: Diagnosis not present

## 2020-07-24 DIAGNOSIS — E119 Type 2 diabetes mellitus without complications: Secondary | ICD-10-CM | POA: Diagnosis not present

## 2020-07-24 DIAGNOSIS — M6281 Muscle weakness (generalized): Secondary | ICD-10-CM | POA: Diagnosis not present

## 2020-07-28 DIAGNOSIS — E039 Hypothyroidism, unspecified: Secondary | ICD-10-CM | POA: Diagnosis not present

## 2020-07-28 DIAGNOSIS — M6281 Muscle weakness (generalized): Secondary | ICD-10-CM | POA: Diagnosis not present

## 2020-07-28 DIAGNOSIS — F32A Depression, unspecified: Secondary | ICD-10-CM | POA: Diagnosis not present

## 2020-07-28 DIAGNOSIS — E1165 Type 2 diabetes mellitus with hyperglycemia: Secondary | ICD-10-CM | POA: Diagnosis not present

## 2020-07-28 DIAGNOSIS — R2689 Other abnormalities of gait and mobility: Secondary | ICD-10-CM | POA: Diagnosis not present

## 2020-07-28 DIAGNOSIS — F0150 Vascular dementia without behavioral disturbance: Secondary | ICD-10-CM | POA: Diagnosis not present

## 2020-07-28 DIAGNOSIS — I10 Essential (primary) hypertension: Secondary | ICD-10-CM | POA: Diagnosis not present

## 2020-07-28 DIAGNOSIS — E119 Type 2 diabetes mellitus without complications: Secondary | ICD-10-CM | POA: Diagnosis not present

## 2020-07-29 DIAGNOSIS — I10 Essential (primary) hypertension: Secondary | ICD-10-CM | POA: Diagnosis not present

## 2020-07-29 DIAGNOSIS — E119 Type 2 diabetes mellitus without complications: Secondary | ICD-10-CM | POA: Diagnosis not present

## 2020-07-29 DIAGNOSIS — F0150 Vascular dementia without behavioral disturbance: Secondary | ICD-10-CM | POA: Diagnosis not present

## 2020-07-29 DIAGNOSIS — M6281 Muscle weakness (generalized): Secondary | ICD-10-CM | POA: Diagnosis not present

## 2020-07-29 DIAGNOSIS — R2689 Other abnormalities of gait and mobility: Secondary | ICD-10-CM | POA: Diagnosis not present

## 2020-07-30 DIAGNOSIS — I10 Essential (primary) hypertension: Secondary | ICD-10-CM | POA: Diagnosis not present

## 2020-07-30 DIAGNOSIS — E119 Type 2 diabetes mellitus without complications: Secondary | ICD-10-CM | POA: Diagnosis not present

## 2020-07-30 DIAGNOSIS — R2689 Other abnormalities of gait and mobility: Secondary | ICD-10-CM | POA: Diagnosis not present

## 2020-07-30 DIAGNOSIS — F0150 Vascular dementia without behavioral disturbance: Secondary | ICD-10-CM | POA: Diagnosis not present

## 2020-07-30 DIAGNOSIS — M6281 Muscle weakness (generalized): Secondary | ICD-10-CM | POA: Diagnosis not present

## 2020-07-31 DIAGNOSIS — E119 Type 2 diabetes mellitus without complications: Secondary | ICD-10-CM | POA: Diagnosis not present

## 2020-07-31 DIAGNOSIS — I10 Essential (primary) hypertension: Secondary | ICD-10-CM | POA: Diagnosis not present

## 2020-07-31 DIAGNOSIS — F0150 Vascular dementia without behavioral disturbance: Secondary | ICD-10-CM | POA: Diagnosis not present

## 2020-07-31 DIAGNOSIS — M6281 Muscle weakness (generalized): Secondary | ICD-10-CM | POA: Diagnosis not present

## 2020-07-31 DIAGNOSIS — R2689 Other abnormalities of gait and mobility: Secondary | ICD-10-CM | POA: Diagnosis not present

## 2020-08-03 DIAGNOSIS — F331 Major depressive disorder, recurrent, moderate: Secondary | ICD-10-CM | POA: Diagnosis not present

## 2020-08-04 DIAGNOSIS — F251 Schizoaffective disorder, depressive type: Secondary | ICD-10-CM | POA: Diagnosis not present

## 2020-08-04 DIAGNOSIS — F411 Generalized anxiety disorder: Secondary | ICD-10-CM | POA: Diagnosis not present

## 2020-08-04 DIAGNOSIS — R419 Unspecified symptoms and signs involving cognitive functions and awareness: Secondary | ICD-10-CM | POA: Diagnosis not present

## 2020-08-04 DIAGNOSIS — F0150 Vascular dementia without behavioral disturbance: Secondary | ICD-10-CM | POA: Diagnosis not present

## 2020-08-04 DIAGNOSIS — E119 Type 2 diabetes mellitus without complications: Secondary | ICD-10-CM | POA: Diagnosis not present

## 2020-08-04 DIAGNOSIS — M6281 Muscle weakness (generalized): Secondary | ICD-10-CM | POA: Diagnosis not present

## 2020-08-04 DIAGNOSIS — R2689 Other abnormalities of gait and mobility: Secondary | ICD-10-CM | POA: Diagnosis not present

## 2020-08-04 DIAGNOSIS — I10 Essential (primary) hypertension: Secondary | ICD-10-CM | POA: Diagnosis not present

## 2020-08-17 DIAGNOSIS — M79674 Pain in right toe(s): Secondary | ICD-10-CM | POA: Diagnosis not present

## 2020-08-17 DIAGNOSIS — B351 Tinea unguium: Secondary | ICD-10-CM | POA: Diagnosis not present

## 2020-08-17 DIAGNOSIS — M79675 Pain in left toe(s): Secondary | ICD-10-CM | POA: Diagnosis not present

## 2020-08-27 DIAGNOSIS — E039 Hypothyroidism, unspecified: Secondary | ICD-10-CM | POA: Diagnosis not present

## 2020-08-31 DIAGNOSIS — F331 Major depressive disorder, recurrent, moderate: Secondary | ICD-10-CM | POA: Diagnosis not present

## 2020-09-01 DIAGNOSIS — F32A Depression, unspecified: Secondary | ICD-10-CM | POA: Diagnosis not present

## 2020-09-01 DIAGNOSIS — E039 Hypothyroidism, unspecified: Secondary | ICD-10-CM | POA: Diagnosis not present

## 2020-09-01 DIAGNOSIS — E1165 Type 2 diabetes mellitus with hyperglycemia: Secondary | ICD-10-CM | POA: Diagnosis not present

## 2020-09-01 DIAGNOSIS — F0150 Vascular dementia without behavioral disturbance: Secondary | ICD-10-CM | POA: Diagnosis not present

## 2020-09-10 DIAGNOSIS — F251 Schizoaffective disorder, depressive type: Secondary | ICD-10-CM | POA: Diagnosis not present

## 2020-09-10 DIAGNOSIS — R419 Unspecified symptoms and signs involving cognitive functions and awareness: Secondary | ICD-10-CM | POA: Diagnosis not present

## 2020-09-10 DIAGNOSIS — F411 Generalized anxiety disorder: Secondary | ICD-10-CM | POA: Diagnosis not present

## 2020-09-28 DIAGNOSIS — F331 Major depressive disorder, recurrent, moderate: Secondary | ICD-10-CM | POA: Diagnosis not present

## 2020-09-29 DIAGNOSIS — F32A Depression, unspecified: Secondary | ICD-10-CM | POA: Diagnosis not present

## 2020-09-29 DIAGNOSIS — E039 Hypothyroidism, unspecified: Secondary | ICD-10-CM | POA: Diagnosis not present

## 2020-09-29 DIAGNOSIS — D509 Iron deficiency anemia, unspecified: Secondary | ICD-10-CM | POA: Diagnosis not present

## 2020-09-29 DIAGNOSIS — K219 Gastro-esophageal reflux disease without esophagitis: Secondary | ICD-10-CM | POA: Diagnosis not present

## 2020-09-29 DIAGNOSIS — F0150 Vascular dementia without behavioral disturbance: Secondary | ICD-10-CM | POA: Diagnosis not present

## 2020-09-29 DIAGNOSIS — I10 Essential (primary) hypertension: Secondary | ICD-10-CM | POA: Diagnosis not present

## 2020-09-29 DIAGNOSIS — E038 Other specified hypothyroidism: Secondary | ICD-10-CM | POA: Diagnosis not present

## 2020-09-29 DIAGNOSIS — E785 Hyperlipidemia, unspecified: Secondary | ICD-10-CM | POA: Diagnosis not present

## 2020-09-29 DIAGNOSIS — E1165 Type 2 diabetes mellitus with hyperglycemia: Secondary | ICD-10-CM | POA: Diagnosis not present

## 2020-09-29 DIAGNOSIS — Z79899 Other long term (current) drug therapy: Secondary | ICD-10-CM | POA: Diagnosis not present

## 2020-10-04 DIAGNOSIS — H25013 Cortical age-related cataract, bilateral: Secondary | ICD-10-CM | POA: Diagnosis not present

## 2020-10-04 DIAGNOSIS — H2513 Age-related nuclear cataract, bilateral: Secondary | ICD-10-CM | POA: Diagnosis not present

## 2020-10-07 DIAGNOSIS — F251 Schizoaffective disorder, depressive type: Secondary | ICD-10-CM | POA: Diagnosis not present

## 2020-10-07 DIAGNOSIS — F411 Generalized anxiety disorder: Secondary | ICD-10-CM | POA: Diagnosis not present

## 2020-10-07 DIAGNOSIS — R419 Unspecified symptoms and signs involving cognitive functions and awareness: Secondary | ICD-10-CM | POA: Diagnosis not present

## 2020-10-15 DIAGNOSIS — D509 Iron deficiency anemia, unspecified: Secondary | ICD-10-CM | POA: Diagnosis not present

## 2020-10-15 DIAGNOSIS — E1165 Type 2 diabetes mellitus with hyperglycemia: Secondary | ICD-10-CM | POA: Diagnosis not present

## 2020-10-15 DIAGNOSIS — Z79899 Other long term (current) drug therapy: Secondary | ICD-10-CM | POA: Diagnosis not present

## 2020-10-15 DIAGNOSIS — E785 Hyperlipidemia, unspecified: Secondary | ICD-10-CM | POA: Diagnosis not present

## 2020-10-15 DIAGNOSIS — E038 Other specified hypothyroidism: Secondary | ICD-10-CM | POA: Diagnosis not present

## 2020-10-20 DIAGNOSIS — I10 Essential (primary) hypertension: Secondary | ICD-10-CM | POA: Diagnosis not present

## 2020-10-20 DIAGNOSIS — K219 Gastro-esophageal reflux disease without esophagitis: Secondary | ICD-10-CM | POA: Diagnosis not present

## 2020-10-20 DIAGNOSIS — E1165 Type 2 diabetes mellitus with hyperglycemia: Secondary | ICD-10-CM | POA: Diagnosis not present

## 2020-10-20 DIAGNOSIS — Z79899 Other long term (current) drug therapy: Secondary | ICD-10-CM | POA: Diagnosis not present

## 2020-10-20 DIAGNOSIS — E039 Hypothyroidism, unspecified: Secondary | ICD-10-CM | POA: Diagnosis not present

## 2020-10-20 DIAGNOSIS — F0150 Vascular dementia without behavioral disturbance: Secondary | ICD-10-CM | POA: Diagnosis not present

## 2020-10-20 DIAGNOSIS — E785 Hyperlipidemia, unspecified: Secondary | ICD-10-CM | POA: Diagnosis not present

## 2020-10-20 DIAGNOSIS — E038 Other specified hypothyroidism: Secondary | ICD-10-CM | POA: Diagnosis not present

## 2020-10-20 DIAGNOSIS — D509 Iron deficiency anemia, unspecified: Secondary | ICD-10-CM | POA: Diagnosis not present

## 2020-10-20 DIAGNOSIS — F32A Depression, unspecified: Secondary | ICD-10-CM | POA: Diagnosis not present

## 2020-10-26 DIAGNOSIS — F331 Major depressive disorder, recurrent, moderate: Secondary | ICD-10-CM | POA: Diagnosis not present

## 2020-11-08 DIAGNOSIS — F411 Generalized anxiety disorder: Secondary | ICD-10-CM | POA: Diagnosis not present

## 2020-11-08 DIAGNOSIS — F251 Schizoaffective disorder, depressive type: Secondary | ICD-10-CM | POA: Diagnosis not present

## 2020-11-08 DIAGNOSIS — R419 Unspecified symptoms and signs involving cognitive functions and awareness: Secondary | ICD-10-CM | POA: Diagnosis not present

## 2020-11-09 DIAGNOSIS — M79674 Pain in right toe(s): Secondary | ICD-10-CM | POA: Diagnosis not present

## 2020-11-09 DIAGNOSIS — M79675 Pain in left toe(s): Secondary | ICD-10-CM | POA: Diagnosis not present

## 2020-11-09 DIAGNOSIS — B351 Tinea unguium: Secondary | ICD-10-CM | POA: Diagnosis not present

## 2020-12-08 DIAGNOSIS — F251 Schizoaffective disorder, depressive type: Secondary | ICD-10-CM | POA: Diagnosis not present

## 2020-12-08 DIAGNOSIS — G3109 Other frontotemporal dementia: Secondary | ICD-10-CM | POA: Diagnosis not present

## 2020-12-08 DIAGNOSIS — F411 Generalized anxiety disorder: Secondary | ICD-10-CM | POA: Diagnosis not present

## 2020-12-29 DIAGNOSIS — F331 Major depressive disorder, recurrent, moderate: Secondary | ICD-10-CM | POA: Diagnosis not present

## 2020-12-31 DIAGNOSIS — E039 Hypothyroidism, unspecified: Secondary | ICD-10-CM | POA: Diagnosis not present

## 2021-01-20 DIAGNOSIS — F411 Generalized anxiety disorder: Secondary | ICD-10-CM | POA: Diagnosis not present

## 2021-01-20 DIAGNOSIS — G3109 Other frontotemporal dementia: Secondary | ICD-10-CM | POA: Diagnosis not present

## 2021-01-20 DIAGNOSIS — F251 Schizoaffective disorder, depressive type: Secondary | ICD-10-CM | POA: Diagnosis not present

## 2021-01-21 DIAGNOSIS — I10 Essential (primary) hypertension: Secondary | ICD-10-CM | POA: Diagnosis not present

## 2021-01-21 DIAGNOSIS — Z79899 Other long term (current) drug therapy: Secondary | ICD-10-CM | POA: Diagnosis not present

## 2021-01-21 DIAGNOSIS — E1165 Type 2 diabetes mellitus with hyperglycemia: Secondary | ICD-10-CM | POA: Diagnosis not present

## 2021-01-25 DIAGNOSIS — M79675 Pain in left toe(s): Secondary | ICD-10-CM | POA: Diagnosis not present

## 2021-01-25 DIAGNOSIS — B351 Tinea unguium: Secondary | ICD-10-CM | POA: Diagnosis not present

## 2021-01-26 DIAGNOSIS — E039 Hypothyroidism, unspecified: Secondary | ICD-10-CM | POA: Diagnosis not present

## 2021-01-26 DIAGNOSIS — F32A Depression, unspecified: Secondary | ICD-10-CM | POA: Diagnosis not present

## 2021-01-26 DIAGNOSIS — E785 Hyperlipidemia, unspecified: Secondary | ICD-10-CM | POA: Diagnosis not present

## 2021-01-26 DIAGNOSIS — F0150 Vascular dementia without behavioral disturbance: Secondary | ICD-10-CM | POA: Diagnosis not present

## 2021-01-26 DIAGNOSIS — K219 Gastro-esophageal reflux disease without esophagitis: Secondary | ICD-10-CM | POA: Diagnosis not present

## 2021-01-26 DIAGNOSIS — D509 Iron deficiency anemia, unspecified: Secondary | ICD-10-CM | POA: Diagnosis not present

## 2021-01-26 DIAGNOSIS — I10 Essential (primary) hypertension: Secondary | ICD-10-CM | POA: Diagnosis not present

## 2021-01-26 DIAGNOSIS — E1165 Type 2 diabetes mellitus with hyperglycemia: Secondary | ICD-10-CM | POA: Diagnosis not present

## 2021-01-26 DIAGNOSIS — Z79899 Other long term (current) drug therapy: Secondary | ICD-10-CM | POA: Diagnosis not present

## 2021-02-14 DIAGNOSIS — Z23 Encounter for immunization: Secondary | ICD-10-CM | POA: Diagnosis not present

## 2021-03-10 DIAGNOSIS — F01C2 Vascular dementia, severe, with psychotic disturbance: Secondary | ICD-10-CM | POA: Diagnosis not present

## 2021-03-10 DIAGNOSIS — F251 Schizoaffective disorder, depressive type: Secondary | ICD-10-CM | POA: Diagnosis not present

## 2021-03-10 DIAGNOSIS — F411 Generalized anxiety disorder: Secondary | ICD-10-CM | POA: Diagnosis not present

## 2021-03-10 DIAGNOSIS — F321 Major depressive disorder, single episode, moderate: Secondary | ICD-10-CM | POA: Diagnosis not present

## 2021-03-14 DIAGNOSIS — H2513 Age-related nuclear cataract, bilateral: Secondary | ICD-10-CM | POA: Diagnosis not present

## 2021-04-07 DIAGNOSIS — F411 Generalized anxiety disorder: Secondary | ICD-10-CM | POA: Diagnosis not present

## 2021-04-07 DIAGNOSIS — F251 Schizoaffective disorder, depressive type: Secondary | ICD-10-CM | POA: Diagnosis not present

## 2021-04-07 DIAGNOSIS — F321 Major depressive disorder, single episode, moderate: Secondary | ICD-10-CM | POA: Diagnosis not present

## 2021-04-07 DIAGNOSIS — F01C2 Vascular dementia, severe, with psychotic disturbance: Secondary | ICD-10-CM | POA: Diagnosis not present

## 2021-04-12 DIAGNOSIS — B351 Tinea unguium: Secondary | ICD-10-CM | POA: Diagnosis not present

## 2021-04-12 DIAGNOSIS — M79674 Pain in right toe(s): Secondary | ICD-10-CM | POA: Diagnosis not present

## 2021-04-12 DIAGNOSIS — M79675 Pain in left toe(s): Secondary | ICD-10-CM | POA: Diagnosis not present

## 2021-04-15 DIAGNOSIS — H2513 Age-related nuclear cataract, bilateral: Secondary | ICD-10-CM | POA: Diagnosis not present

## 2021-04-15 DIAGNOSIS — H25013 Cortical age-related cataract, bilateral: Secondary | ICD-10-CM | POA: Diagnosis not present

## 2021-04-20 DIAGNOSIS — K219 Gastro-esophageal reflux disease without esophagitis: Secondary | ICD-10-CM | POA: Diagnosis not present

## 2021-04-20 DIAGNOSIS — E039 Hypothyroidism, unspecified: Secondary | ICD-10-CM | POA: Diagnosis not present

## 2021-04-20 DIAGNOSIS — E1165 Type 2 diabetes mellitus with hyperglycemia: Secondary | ICD-10-CM | POA: Diagnosis not present

## 2021-04-20 DIAGNOSIS — R269 Unspecified abnormalities of gait and mobility: Secondary | ICD-10-CM | POA: Diagnosis not present

## 2021-04-20 DIAGNOSIS — I10 Essential (primary) hypertension: Secondary | ICD-10-CM | POA: Diagnosis not present

## 2021-04-20 DIAGNOSIS — F01511 Vascular dementia, unspecified severity, with agitation: Secondary | ICD-10-CM | POA: Diagnosis not present

## 2021-04-29 DIAGNOSIS — I10 Essential (primary) hypertension: Secondary | ICD-10-CM | POA: Diagnosis not present

## 2021-04-29 DIAGNOSIS — E1165 Type 2 diabetes mellitus with hyperglycemia: Secondary | ICD-10-CM | POA: Diagnosis not present

## 2021-06-06 DIAGNOSIS — F251 Schizoaffective disorder, depressive type: Secondary | ICD-10-CM | POA: Diagnosis not present

## 2021-06-06 DIAGNOSIS — F321 Major depressive disorder, single episode, moderate: Secondary | ICD-10-CM | POA: Diagnosis not present

## 2021-06-06 DIAGNOSIS — F01C2 Vascular dementia, severe, with psychotic disturbance: Secondary | ICD-10-CM | POA: Diagnosis not present

## 2021-06-06 DIAGNOSIS — F411 Generalized anxiety disorder: Secondary | ICD-10-CM | POA: Diagnosis not present

## 2021-06-28 DIAGNOSIS — M79675 Pain in left toe(s): Secondary | ICD-10-CM | POA: Diagnosis not present

## 2021-06-28 DIAGNOSIS — M79674 Pain in right toe(s): Secondary | ICD-10-CM | POA: Diagnosis not present

## 2021-06-28 DIAGNOSIS — B351 Tinea unguium: Secondary | ICD-10-CM | POA: Diagnosis not present

## 2021-07-06 DIAGNOSIS — F01511 Vascular dementia, unspecified severity, with agitation: Secondary | ICD-10-CM | POA: Diagnosis not present

## 2021-07-06 DIAGNOSIS — K219 Gastro-esophageal reflux disease without esophagitis: Secondary | ICD-10-CM | POA: Diagnosis not present

## 2021-07-06 DIAGNOSIS — E1165 Type 2 diabetes mellitus with hyperglycemia: Secondary | ICD-10-CM | POA: Diagnosis not present

## 2021-07-06 DIAGNOSIS — R269 Unspecified abnormalities of gait and mobility: Secondary | ICD-10-CM | POA: Diagnosis not present

## 2021-07-06 DIAGNOSIS — I10 Essential (primary) hypertension: Secondary | ICD-10-CM | POA: Diagnosis not present

## 2021-07-07 DIAGNOSIS — F251 Schizoaffective disorder, depressive type: Secondary | ICD-10-CM | POA: Diagnosis not present

## 2021-07-07 DIAGNOSIS — F01C2 Vascular dementia, severe, with psychotic disturbance: Secondary | ICD-10-CM | POA: Diagnosis not present

## 2021-07-07 DIAGNOSIS — F411 Generalized anxiety disorder: Secondary | ICD-10-CM | POA: Diagnosis not present

## 2021-07-07 DIAGNOSIS — F321 Major depressive disorder, single episode, moderate: Secondary | ICD-10-CM | POA: Diagnosis not present

## 2021-07-25 DIAGNOSIS — N390 Urinary tract infection, site not specified: Secondary | ICD-10-CM | POA: Diagnosis not present

## 2021-08-04 DIAGNOSIS — F321 Major depressive disorder, single episode, moderate: Secondary | ICD-10-CM | POA: Diagnosis not present

## 2021-08-04 DIAGNOSIS — F01C2 Vascular dementia, severe, with psychotic disturbance: Secondary | ICD-10-CM | POA: Diagnosis not present

## 2021-08-04 DIAGNOSIS — F411 Generalized anxiety disorder: Secondary | ICD-10-CM | POA: Diagnosis not present

## 2021-08-04 DIAGNOSIS — F251 Schizoaffective disorder, depressive type: Secondary | ICD-10-CM | POA: Diagnosis not present

## 2021-08-25 DIAGNOSIS — E1165 Type 2 diabetes mellitus with hyperglycemia: Secondary | ICD-10-CM | POA: Diagnosis not present

## 2021-08-25 DIAGNOSIS — I10 Essential (primary) hypertension: Secondary | ICD-10-CM | POA: Diagnosis not present

## 2021-08-25 DIAGNOSIS — E039 Hypothyroidism, unspecified: Secondary | ICD-10-CM | POA: Diagnosis not present

## 2021-09-01 DIAGNOSIS — F321 Major depressive disorder, single episode, moderate: Secondary | ICD-10-CM | POA: Diagnosis not present

## 2021-09-01 DIAGNOSIS — F251 Schizoaffective disorder, depressive type: Secondary | ICD-10-CM | POA: Diagnosis not present

## 2021-09-01 DIAGNOSIS — F01C2 Vascular dementia, severe, with psychotic disturbance: Secondary | ICD-10-CM | POA: Diagnosis not present

## 2021-09-01 DIAGNOSIS — F411 Generalized anxiety disorder: Secondary | ICD-10-CM | POA: Diagnosis not present

## 2021-09-13 DIAGNOSIS — B351 Tinea unguium: Secondary | ICD-10-CM | POA: Diagnosis not present

## 2021-09-13 DIAGNOSIS — M79675 Pain in left toe(s): Secondary | ICD-10-CM | POA: Diagnosis not present

## 2021-09-18 ENCOUNTER — Emergency Department (HOSPITAL_COMMUNITY): Payer: Medicare Other

## 2021-09-18 ENCOUNTER — Other Ambulatory Visit: Payer: Self-pay

## 2021-09-18 ENCOUNTER — Inpatient Hospital Stay (HOSPITAL_COMMUNITY)
Admission: EM | Admit: 2021-09-18 | Discharge: 2021-09-26 | DRG: 177 | Disposition: A | Discharge status: Skilled Nursing Facility | Payer: Medicare Other | Source: Skilled Nursing Facility | Attending: Internal Medicine | Admitting: Internal Medicine

## 2021-09-18 DIAGNOSIS — Z833 Family history of diabetes mellitus: Secondary | ICD-10-CM

## 2021-09-18 DIAGNOSIS — Z515 Encounter for palliative care: Secondary | ICD-10-CM

## 2021-09-18 DIAGNOSIS — J690 Pneumonitis due to inhalation of food and vomit: Principal | ICD-10-CM | POA: Diagnosis present

## 2021-09-18 DIAGNOSIS — I10 Essential (primary) hypertension: Secondary | ICD-10-CM | POA: Diagnosis present

## 2021-09-18 DIAGNOSIS — K219 Gastro-esophageal reflux disease without esophagitis: Secondary | ICD-10-CM | POA: Diagnosis present

## 2021-09-18 DIAGNOSIS — E039 Hypothyroidism, unspecified: Secondary | ICD-10-CM | POA: Diagnosis present

## 2021-09-18 DIAGNOSIS — I959 Hypotension, unspecified: Secondary | ICD-10-CM | POA: Diagnosis not present

## 2021-09-18 DIAGNOSIS — Z7189 Other specified counseling: Secondary | ICD-10-CM | POA: Diagnosis not present

## 2021-09-18 DIAGNOSIS — R059 Cough, unspecified: Secondary | ICD-10-CM | POA: Diagnosis not present

## 2021-09-18 DIAGNOSIS — R5383 Other fatigue: Secondary | ICD-10-CM | POA: Diagnosis not present

## 2021-09-18 DIAGNOSIS — E875 Hyperkalemia: Secondary | ICD-10-CM | POA: Diagnosis present

## 2021-09-18 DIAGNOSIS — E785 Hyperlipidemia, unspecified: Secondary | ICD-10-CM | POA: Diagnosis present

## 2021-09-18 DIAGNOSIS — M2550 Pain in unspecified joint: Secondary | ICD-10-CM | POA: Diagnosis not present

## 2021-09-18 DIAGNOSIS — E876 Hypokalemia: Secondary | ICD-10-CM | POA: Diagnosis not present

## 2021-09-18 DIAGNOSIS — E119 Type 2 diabetes mellitus without complications: Secondary | ICD-10-CM | POA: Diagnosis present

## 2021-09-18 DIAGNOSIS — Z7401 Bed confinement status: Secondary | ICD-10-CM | POA: Diagnosis not present

## 2021-09-18 DIAGNOSIS — J181 Lobar pneumonia, unspecified organism: Secondary | ICD-10-CM | POA: Diagnosis present

## 2021-09-18 DIAGNOSIS — G9341 Metabolic encephalopathy: Secondary | ICD-10-CM | POA: Diagnosis present

## 2021-09-18 DIAGNOSIS — E873 Alkalosis: Secondary | ICD-10-CM | POA: Diagnosis not present

## 2021-09-18 DIAGNOSIS — F0390 Unspecified dementia without behavioral disturbance: Secondary | ICD-10-CM | POA: Diagnosis present

## 2021-09-18 DIAGNOSIS — R0689 Other abnormalities of breathing: Secondary | ICD-10-CM | POA: Diagnosis not present

## 2021-09-18 DIAGNOSIS — J189 Pneumonia, unspecified organism: Secondary | ICD-10-CM | POA: Diagnosis not present

## 2021-09-18 DIAGNOSIS — N179 Acute kidney failure, unspecified: Secondary | ICD-10-CM | POA: Diagnosis present

## 2021-09-18 DIAGNOSIS — J9601 Acute respiratory failure with hypoxia: Secondary | ICD-10-CM | POA: Diagnosis present

## 2021-09-18 DIAGNOSIS — Z66 Do not resuscitate: Secondary | ICD-10-CM | POA: Diagnosis present

## 2021-09-18 DIAGNOSIS — R0602 Shortness of breath: Secondary | ICD-10-CM | POA: Diagnosis not present

## 2021-09-18 DIAGNOSIS — Z7984 Long term (current) use of oral hypoglycemic drugs: Secondary | ICD-10-CM

## 2021-09-18 DIAGNOSIS — Z79890 Hormone replacement therapy (postmenopausal): Secondary | ICD-10-CM

## 2021-09-18 DIAGNOSIS — G9340 Encephalopathy, unspecified: Secondary | ICD-10-CM | POA: Diagnosis present

## 2021-09-18 DIAGNOSIS — Z79899 Other long term (current) drug therapy: Secondary | ICD-10-CM | POA: Diagnosis not present

## 2021-09-18 DIAGNOSIS — E860 Dehydration: Secondary | ICD-10-CM | POA: Diagnosis present

## 2021-09-18 DIAGNOSIS — Z87891 Personal history of nicotine dependence: Secondary | ICD-10-CM

## 2021-09-18 DIAGNOSIS — D72829 Elevated white blood cell count, unspecified: Secondary | ICD-10-CM | POA: Diagnosis present

## 2021-09-18 DIAGNOSIS — J441 Chronic obstructive pulmonary disease with (acute) exacerbation: Secondary | ICD-10-CM | POA: Diagnosis present

## 2021-09-18 DIAGNOSIS — R531 Weakness: Secondary | ICD-10-CM | POA: Diagnosis not present

## 2021-09-18 LAB — CBC WITH DIFFERENTIAL/PLATELET
Abs Immature Granulocytes: 0.15 10*3/uL — ABNORMAL HIGH (ref 0.00–0.07)
Basophils Absolute: 0.1 10*3/uL (ref 0.0–0.1)
Basophils Relative: 0 %
Eosinophils Absolute: 0.2 10*3/uL (ref 0.0–0.5)
Eosinophils Relative: 1 %
HCT: 34 % — ABNORMAL LOW (ref 36.0–46.0)
Hemoglobin: 10.6 g/dL — ABNORMAL LOW (ref 12.0–15.0)
Immature Granulocytes: 1 %
Lymphocytes Relative: 14 %
Lymphs Abs: 1.6 10*3/uL (ref 0.7–4.0)
MCH: 29.1 pg (ref 26.0–34.0)
MCHC: 31.2 g/dL (ref 30.0–36.0)
MCV: 93.4 fL (ref 80.0–100.0)
Monocytes Absolute: 1 10*3/uL (ref 0.1–1.0)
Monocytes Relative: 9 %
Neutro Abs: 8.2 10*3/uL — ABNORMAL HIGH (ref 1.7–7.7)
Neutrophils Relative %: 75 %
Platelets: 268 10*3/uL (ref 150–400)
RBC: 3.64 MIL/uL — ABNORMAL LOW (ref 3.87–5.11)
RDW: 13.2 % (ref 11.5–15.5)
WBC: 11.2 10*3/uL — ABNORMAL HIGH (ref 4.0–10.5)
nRBC: 0 % (ref 0.0–0.2)

## 2021-09-18 LAB — COMPREHENSIVE METABOLIC PANEL
ALT: 31 U/L (ref 0–44)
AST: 48 U/L — ABNORMAL HIGH (ref 15–41)
Albumin: 3.4 g/dL — ABNORMAL LOW (ref 3.5–5.0)
Alkaline Phosphatase: 86 U/L (ref 38–126)
Anion gap: 7 (ref 5–15)
BUN: 28 mg/dL — ABNORMAL HIGH (ref 8–23)
CO2: 27 mmol/L (ref 22–32)
Calcium: 8.8 mg/dL — ABNORMAL LOW (ref 8.9–10.3)
Chloride: 102 mmol/L (ref 98–111)
Creatinine, Ser: 1.09 mg/dL — ABNORMAL HIGH (ref 0.44–1.00)
GFR, Estimated: 53 mL/min — ABNORMAL LOW (ref >60)
Glucose, Bld: 121 mg/dL — ABNORMAL HIGH (ref 70–99)
Potassium: 6.6 mmol/L (ref 3.5–5.1)
Sodium: 136 mmol/L (ref 135–145)
Total Bilirubin: 1.4 mg/dL — ABNORMAL HIGH (ref 0.3–1.2)
Total Protein: 7 g/dL (ref 6.5–8.1)

## 2021-09-18 LAB — CBC
HCT: 33.1 % — ABNORMAL LOW (ref 36.0–46.0)
Hemoglobin: 10.4 g/dL — ABNORMAL LOW (ref 12.0–15.0)
MCH: 29.6 pg (ref 26.0–34.0)
MCHC: 31.4 g/dL (ref 30.0–36.0)
MCV: 94.3 fL (ref 80.0–100.0)
Platelets: 243 10*3/uL (ref 150–400)
RBC: 3.51 MIL/uL — ABNORMAL LOW (ref 3.87–5.11)
RDW: 13.2 % (ref 11.5–15.5)
WBC: 10.4 10*3/uL (ref 4.0–10.5)
nRBC: 0 % (ref 0.0–0.2)

## 2021-09-18 LAB — CREATININE, SERUM
Creatinine, Ser: 0.89 mg/dL (ref 0.44–1.00)
GFR, Estimated: >60 mL/min (ref >60)

## 2021-09-18 LAB — LACTIC ACID, PLASMA
Lactic Acid, Venous: 1.2 mmol/L (ref 0.5–1.9)
Lactic Acid, Venous: 1.3 mmol/L (ref 0.5–1.9)

## 2021-09-18 LAB — GLUCOSE, CAPILLARY: Glucose-Capillary: 106 mg/dL — ABNORMAL HIGH (ref 70–99)

## 2021-09-18 LAB — BRAIN NATRIURETIC PEPTIDE: B Natriuretic Peptide: 64.2 pg/mL (ref 0.0–100.0)

## 2021-09-18 MED ORDER — SODIUM BICARBONATE 8.4 % IV SOLN
50.0000 meq | Freq: Once | INTRAVENOUS | Status: AC
  Administered 2021-09-18: 50 meq via INTRAVENOUS
  Filled 2021-09-18: qty 50

## 2021-09-18 MED ORDER — SODIUM CHLORIDE 0.9 % IV SOLN
500.0000 mg | Freq: Once | INTRAVENOUS | Status: AC
  Administered 2021-09-18: 500 mg via INTRAVENOUS
  Filled 2021-09-18: qty 5

## 2021-09-18 MED ORDER — SODIUM CHLORIDE 0.9 % IV SOLN: INTRAVENOUS | Status: DC

## 2021-09-18 MED ORDER — SODIUM CHLORIDE 0.9 % IV SOLN
2.0000 g | INTRAVENOUS | Status: DC
  Administered 2021-09-19 – 2021-09-20 (×2): 2 g via INTRAVENOUS
  Filled 2021-09-18 (×2): qty 20

## 2021-09-18 MED ORDER — INSULIN ASPART 100 UNIT/ML IJ SOLN
0.0000 [IU] | Freq: Three times a day (TID) | INTRAMUSCULAR | Status: DC
  Administered 2021-09-19 – 2021-09-21 (×5): 2 [IU] via SUBCUTANEOUS
  Administered 2021-09-22: 3 [IU] via SUBCUTANEOUS
  Administered 2021-09-22 – 2021-09-24 (×3): 2 [IU] via SUBCUTANEOUS
  Administered 2021-09-25 – 2021-09-26 (×2): 3 [IU] via SUBCUTANEOUS

## 2021-09-18 MED ORDER — ENOXAPARIN SODIUM 40 MG/0.4ML IJ SOSY
40.0000 mg | PREFILLED_SYRINGE | INTRAMUSCULAR | Status: DC
Start: 2021-09-18 — End: 2021-09-26
  Administered 2021-09-18 – 2021-09-26 (×7): 40 mg via SUBCUTANEOUS
  Filled 2021-09-18 (×7): qty 0.4

## 2021-09-18 MED ORDER — SODIUM CHLORIDE 0.9 % IV SOLN
500.0000 mg | INTRAVENOUS | Status: DC
  Administered 2021-09-19: 500 mg via INTRAVENOUS
  Filled 2021-09-18 (×2): qty 5

## 2021-09-18 MED ORDER — INSULIN ASPART 100 UNIT/ML IV SOLN
10.0000 [IU] | Freq: Once | INTRAVENOUS | Status: AC
  Administered 2021-09-18: 10 [IU] via INTRAVENOUS
  Filled 2021-09-18: qty 0.1

## 2021-09-18 MED ORDER — SODIUM ZIRCONIUM CYCLOSILICATE 10 G PO PACK
10.0000 g | PACK | Freq: Once | ORAL | Status: AC
  Administered 2021-09-18: 10 g via ORAL
  Filled 2021-09-18: qty 1

## 2021-09-18 MED ORDER — SODIUM CHLORIDE 0.9 % IV SOLN
2.0000 g | Freq: Once | INTRAVENOUS | Status: AC
  Administered 2021-09-18: 2 g via INTRAVENOUS
  Filled 2021-09-18: qty 20

## 2021-09-18 MED ORDER — DEXTROSE 50 % IV SOLN
1.0000 | Freq: Once | INTRAVENOUS | Status: AC
Start: 2021-09-18 — End: 2021-09-18
  Administered 2021-09-18: 50 mL via INTRAVENOUS
  Filled 2021-09-18: qty 50

## 2021-09-18 MED ORDER — INSULIN ASPART 100 UNIT/ML IJ SOLN: 0.0000 [IU] | Freq: Every day | INTRAMUSCULAR | Status: DC

## 2021-09-18 NOTE — ED Triage Notes (Addendum)
Pt BIBA. Pt lives in a facility, but was picked up at their family's home. EMS called for c/o moderate lethargy, w/ possible hematuria. Pt is cognitively at baseline, w/hx of dementia.  Productive cough w/clear sputum  Aox2, some confusion as to place and time  BP: 122/62 HR: 90 SPO2: 88RA and 95 2L [place] RR: 20 CBG: 139

## 2021-09-18 NOTE — ED Provider Notes (Signed)
[facility] DEPT Provider Note   CSN: [id number] Arrival date & time: 09/18/21  1541     History {Add pertinent medical, surgical, social history, OB history to HPI:1} Chief Complaint  Patient presents with   Fatigue    Paula Andrews is a 76 y.o. female.  Patient has a history of hypertension diabetes and dementia.  She has had a cough recently and has been fatigued.   Extremity Weakness       Home Medications Prior to Admission medications   Medication Sig Start Date End Date Taking? Authorizing Provider  acetaminophen (TYLENOL) 325 MG tablet Take 2 tablets (650 mg total) by mouth 2 (two) times daily as needed for mild pain or moderate pain. 11/28/19   Elnora Morrison, MD  albuterol (VENTOLIN HFA) 108 (90 Base) MCG/ACT inhaler Inhale 2 puffs into the lungs every 6 (six) hours as needed for wheezing or shortness of breath. 11/28/19   Elnora Morrison, MD  amLODipine (NORVASC) 5 MG tablet Take 1 tablet (5 mg total) by mouth daily. 11/28/19   Elnora Morrison, MD  busPIRone (BUSPAR) 10 MG tablet Take 1 tablet (10 mg total) by mouth 3 (three) times daily as needed. 11/28/19   Elnora Morrison, MD  donepezil (ARICEPT) 10 MG tablet Take 1 tablet (10 mg total) by mouth at bedtime. 11/28/19   Elnora Morrison, MD  Fluticasone-Salmeterol (ADVAIR DISKUS) 250-50 MCG/DOSE AEPB Inhale 1 puff into the lungs 2 (two) times daily. 11/28/19 02/26/20  Elnora Morrison, MD  levothyroxine (SYNTHROID) 75 MCG tablet Take 1 tablet (75 mcg total) by mouth daily. 11/28/19   Elnora Morrison, MD  lisinopril (ZESTRIL) 5 MG tablet Take 1 tablet (5 mg total) by mouth daily. 11/28/19   Elnora Morrison, MD  memantine (NAMENDA XR) 21 MG CP24 24 hr capsule Take 1 capsule (21 mg total) by mouth daily. 11/28/19   Elnora Morrison, MD  metFORMIN (GLUCOPHAGE) 500 MG tablet Take 1 tablet (500 mg total) by mouth daily as needed (for high blood sugars). 11/28/19   Elnora Morrison, MD  metoprolol tartrate  (LOPRESSOR) 25 MG tablet Take 1 tablet (25 mg total) by mouth 2 (two) times daily. 11/28/19   Elnora Morrison, MD  pantoprazole (PROTONIX) 40 MG tablet Take 1 tablet (40 mg total) by mouth daily. 11/28/19   Elnora Morrison, MD  pravastatin (PRAVACHOL) 20 MG tablet Take 1 tablet (20 mg total) by mouth every morning. 11/28/19   Elnora Morrison, MD  QUEtiapine (SEROQUEL) 25 MG tablet Take 1 tablet (25 mg total) by mouth at bedtime as needed. 11/28/19   Elnora Morrison, MD  Rivaroxaban (XARELTO) 15 MG TABS tablet Take 1 tablet (15 mg total) by mouth 2 (two) times daily with a meal for 19 days. 03/17/18 04/05/18  Barton Dubois, MD  rivaroxaban (XARELTO) 20 MG TABS tablet Take 1 tablet (20 mg total) by mouth daily with supper. Patient not taking: Reported on 11/25/2019 04/06/18   Barton Dubois, MD      Allergies    Patient has no known allergies.    Review of Systems   Review of Systems  Musculoskeletal:  Positive for extremity weakness.    Physical Exam Updated Vital Signs BP (!) 125/54   Pulse 85   Temp 98.8 F (37.1 C) (Oral)   Resp 20   SpO2 95%  Physical Exam  ED Results / Procedures / Treatments   Labs (all labs ordered are listed, but only abnormal results are displayed) Labs Reviewed  CBC WITH DIFFERENTIAL/PLATELET -  Abnormal; Notable for the following components:      Result Value   WBC 11.2 (*)    RBC 3.64 (*)    Hemoglobin 10.6 (*)    HCT 34.0 (*)    Neutro Abs 8.2 (*)    Abs Immature Granulocytes 0.15 (*)    All other components within normal limits  COMPREHENSIVE METABOLIC PANEL - Abnormal; Notable for the following components:   Potassium 6.6 (*)    Glucose, Bld 121 (*)    BUN 28 (*)    Creatinine, Ser 1.09 (*)    Calcium 8.8 (*)    Albumin 3.4 (*)    AST 48 (*)    Total Bilirubin 1.4 (*)    GFR, Estimated 53 (*)    All other components within normal limits  LACTIC ACID, PLASMA  LACTIC ACID, PLASMA  URINALYSIS, ROUTINE W REFLEX MICROSCOPIC  BRAIN NATRIURETIC PEPTIDE     EKG None  Radiology DG Chest 2 View  Result Date: 09/18/2021 CLINICAL DATA:  Productive cough EXAM: CHEST - 2 VIEW COMPARISON:  09/18/2021 FINDINGS: Lung volumes are small, but are symmetric and are stable since prior examination. Diffuse mild interstitial infiltrate has developed with more focal consolidation within the right lower lobe in keeping with acute infection in the appropriate clinical setting. No pneumothorax or pleural effusion. Cardiac size within normal limits. Pulmonary vascularity is normal. IMPRESSION: Interval development of diffuse interstitial infiltrate with more focal consolidation within the right lower lobe in keeping with acute infection in the appropriate clinical setting. Electronically Signed   By: Fidela Salisbury M.D.   On: 09/18/2021 17:40   DG Chest Port 1 View  Result Date: 09/18/2021 CLINICAL DATA:  sob EXAM: PORTABLE CHEST 1 VIEW COMPARISON:  Chest x-ray 04/08/2018 FINDINGS: The heart and mediastinal contours are unchanged. Aortic calcification. Query retrocardiac airspace opacity. No pulmonary edema. No pleural effusion. No pneumothorax. No acute osseous abnormality. IMPRESSION: Query retrocardiac airspace opacity. Recommend PA and lateral view of the chest. Electronically Signed   By: Iven Finn M.D.   On: 09/18/2021 16:36    Procedures Procedures  {Document cardiac monitor, telemetry assessment procedure when appropriate:1}  Medications Ordered in ED Medications  azithromycin (ZITHROMAX) 500 mg in sodium chloride 0.9 % 250 mL IVPB (has no administration in time range)  cefTRIAXone (ROCEPHIN) 2 g in sodium chloride 0.9 % 100 mL IVPB (has no administration in time range)  insulin aspart (novoLOG) injection 10 Units (has no administration in time range)    And  dextrose 50 % solution 50 mL (50 mLs Intravenous Given 09/18/21 1816)  sodium zirconium cyclosilicate (LOKELMA) packet 10 g (has no administration in time range)  sodium bicarbonate injection  50 mEq (50 mEq Intravenous Given 09/18/21 1816)    ED Course/ Medical Decision Making/ A&P  CRITICAL CARE Performed by: Milton Ferguson Total critical care time: 40 minutes Critical care time was exclusive of separately billable procedures and treating other patients. Critical care was necessary to treat or prevent imminent or life-threatening deterioration. Critical care was time spent personally by me on the following activities: development of treatment plan with patient and/or surrogate as well as nursing, discussions with consultants, evaluation of patient's response to treatment, examination of patient, obtaining history from patient or surrogate, ordering and performing treatments and interventions, ordering and review of laboratory studies, ordering and review of radiographic studies, pulse oximetry and re-evaluation of patient's condition.  Medical Decision Making Amount and/or Complexity of Data Reviewed Labs: ordered. Radiology: ordered. ECG/medicine tests: ordered.  Risk OTC drugs. Prescription drug management. Decision regarding hospitalization.   Patient with pneumonia and hypoxia.  O2 sat 88%.  Patient also has hyperkalemia.  She will be admitted to medicine  {Document critical care time when appropriate:1} {Document review of labs and clinical decision tools ie heart score, Chads2Vasc2 etc:1}  {Document your independent review of radiology images, and any outside records:1} {Document your discussion with family members, caretakers, and with consultants:1} {Document social determinants of health affecting pt's care:1} {Document your decision making why or why not admission, treatments were needed:1} Final Clinical Impression(s) / ED Diagnoses Final diagnoses:  Community acquired pneumonia of right middle lobe of lung  Hyperkalemia    Rx / DC Orders ED Discharge Orders     None

## 2021-09-18 NOTE — H&P (Signed)
History and Physical    Patient: Paula Andrews EUM:[id number] DOB: [birth year] DOA: 09/18/2021 DOS: the patient was seen and examined on 09/18/2021 PCP: Lajean Manes, MD  Patient coming from: ALF/ILF  Chief Complaint:  Chief Complaint  Patient presents with   Fatigue   HPI: Paula Andrews is a 76 y.o. female with medical history significant of diabetes, dementia, hypertension, hypothyroidism, GERD, hyperlipidemia, COPD, previous metabolic encephalopathy, was brought in secondary to shortness of breath, and hypoxia.  Patient is not on chronic oxygen.  Here she was found to be requiring oxygen.  Oxygen sat was found to be 88% on room air.  Patient is unable to give much history due to dementia.  She was additionally found to have a potassium of 6.6.  Some EKG changes associated with it.  No prior history of renal failure.  Not on any known potassium supplementation.  Patient being admitted therefore for for acute respiratory failure.  She has leukocytosis.  Review of Systems: As mentioned in the history of present illness. All other systems reviewed and are negative. Past Medical History:  Diagnosis Date   Dementia ([place])    Diabetes mellitus    Hypertension    Thyroid disease    Past Surgical History:  Procedure Laterality Date   ABDOMINAL HYSTERECTOMY     Social History:  reports that she quit smoking about 20 years ago. Her smoking use included cigarettes. She has a 41.00 pack-year smoking history. She has never used smokeless tobacco. She reports that she does not drink alcohol and does not use drugs.  No Known Allergies  Family History  Problem Relation Age of Onset   Diabetes Other     Prior to Admission medications   Medication Sig Start Date End Date Taking? Authorizing Provider  acetaminophen (TYLENOL) 325 MG tablet Take 2 tablets (650 mg total) by mouth 2 (two) times daily as needed for mild pain or moderate pain. 11/28/19   Elnora Morrison, MD  albuterol (VENTOLIN HFA)  108 (90 Base) MCG/ACT inhaler Inhale 2 puffs into the lungs every 6 (six) hours as needed for wheezing or shortness of breath. 11/28/19   Elnora Morrison, MD  amLODipine (NORVASC) 5 MG tablet Take 1 tablet (5 mg total) by mouth daily. 11/28/19   Elnora Morrison, MD  busPIRone (BUSPAR) 10 MG tablet Take 1 tablet (10 mg total) by mouth 3 (three) times daily as needed. 11/28/19   Elnora Morrison, MD  donepezil (ARICEPT) 10 MG tablet Take 1 tablet (10 mg total) by mouth at bedtime. 11/28/19   Elnora Morrison, MD  Fluticasone-Salmeterol (ADVAIR DISKUS) 250-50 MCG/DOSE AEPB Inhale 1 puff into the lungs 2 (two) times daily. 11/28/19 02/26/20  Elnora Morrison, MD  levothyroxine (SYNTHROID) 75 MCG tablet Take 1 tablet (75 mcg total) by mouth daily. 11/28/19   Elnora Morrison, MD  lisinopril (ZESTRIL) 5 MG tablet Take 1 tablet (5 mg total) by mouth daily. 11/28/19   Elnora Morrison, MD  memantine (NAMENDA XR) 21 MG CP24 24 hr capsule Take 1 capsule (21 mg total) by mouth daily. 11/28/19   Elnora Morrison, MD  metFORMIN (GLUCOPHAGE) 500 MG tablet Take 1 tablet (500 mg total) by mouth daily as needed (for high blood sugars). 11/28/19   Elnora Morrison, MD  metoprolol tartrate (LOPRESSOR) 25 MG tablet Take 1 tablet (25 mg total) by mouth 2 (two) times daily. 11/28/19   Elnora Morrison, MD  pantoprazole (PROTONIX) 40 MG tablet Take 1 tablet (40 mg total) by mouth daily. 11/28/19  Elnora Morrison, MD  pravastatin (PRAVACHOL) 20 MG tablet Take 1 tablet (20 mg total) by mouth every morning. 11/28/19   Elnora Morrison, MD  QUEtiapine (SEROQUEL) 25 MG tablet Take 1 tablet (25 mg total) by mouth at bedtime as needed. 11/28/19   Elnora Morrison, MD  Rivaroxaban (XARELTO) 15 MG TABS tablet Take 1 tablet (15 mg total) by mouth 2 (two) times daily with a meal for 19 days. 03/17/18 04/05/18  Barton Dubois, MD  rivaroxaban (XARELTO) 20 MG TABS tablet Take 1 tablet (20 mg total) by mouth daily with supper. Patient not taking: Reported on 11/25/2019  04/06/18   Barton Dubois, MD    Physical Exam: Vitals:   09/18/21 1608 09/18/21 1630 09/18/21 1645 09/18/21 1800  BP: (!) 109/47 (!) 123/56 111/70 (!) 125/54  Pulse:  95 95 85  Resp:  '18 16 20  '$ Temp:      TempSrc:      SpO2:  94% 95% 95%   Generally: Stable, no acute distress HEENT: PERRLA, EOMI, no pallor, no jaundice no rhinorrhea Neck: Supple, no JVD, no lymphadenopathy Respiratory: Decreased air entry at the bases, no significant wheeze or rales Cardiovascular: Regular rate and rhythm Abdomen: Soft, nontender with positive bowel sounds Extremities: No edema cyanosis or clubbing Skin exam: Normal rashes or ulcers Neurologic: Nonfocal  Data Reviewed:  Sodium 136, potassium 6.3, chloride 102, CO2 27, glucose 121, BUN 28 creatinine 1.09 and calcium 8.8.  Albumin 3.4 AST 48.  Total bili is 1.4 lactic acid 1.2 white count is 11.2, hemoglobin 10.6 chest x-ray showed interval development of diffuse interstitial infiltrate.  EKG showed normal sinus rhythm with a rate of 96, peaked T waves.  Assessment and Plan:  #1 acute hypoxic respiratory failure: Secondary to pneumonia.  Patient has underlying history of COPD but no evidence of acute exacerbation.  We will continue antibiotics.  #2 pneumonia: Community-acquired.  Patient is from assisted living.  We will assess for possible Pseudomonas and if so change antibiotics.  #3 type 2 diabetes: Non-insulin-dependent.  Initiate sliding scale.  Hold metformin  #4 essential hypertension, confirm and resume home regimen  #5 dementia: At baseline.  Resume home regimen  #6 acute metabolic encephalopathy: Patient is confused.  Most likely secondary to pneumonia.  #7 COPD: No acute exacerbation  #8 hyperlipidemia: Continue statin.  #9 hyperkalemia: No obvious cause.  Patient has been initiated with D50 and insulin.  Also Lokelma.  #10 hypothyroidism: Continue levothyroxine     Advance Care Planning:   Code Status: Prior full  code  Consults: None  Family Communication: No family at bedside  Severity of Illness: The appropriate patient status for this patient is INPATIENT. Inpatient status is judged to be reasonable and necessary in order to provide the required intensity of service to ensure the patient's safety. The patient's presenting symptoms, physical exam findings, and initial radiographic and laboratory data in the context of their chronic comorbidities is felt to place them at high risk for further clinical deterioration. Furthermore, it is not anticipated that the patient will be medically stable for discharge from the hospital within 2 midnights of admission.   * I certify that at the point of admission it is my clinical judgment that the patient will require inpatient hospital care spanning beyond 2 midnights from the point of admission due to high intensity of service, high risk for further deterioration and high frequency of surveillance required.*  AuthorBarbette Merino, MD 09/18/2021 6:26 PM  For on call review [email or web address].

## 2021-09-19 DIAGNOSIS — J9601 Acute respiratory failure with hypoxia: Secondary | ICD-10-CM | POA: Diagnosis not present

## 2021-09-19 LAB — URINALYSIS, ROUTINE W REFLEX MICROSCOPIC
Bacteria, UA: NONE SEEN
Bilirubin Urine: NEGATIVE
Glucose, UA: 50 mg/dL — AB
Hgb urine dipstick: NEGATIVE
Ketones, ur: NEGATIVE mg/dL
Nitrite: NEGATIVE
Protein, ur: 30 mg/dL — AB
Specific Gravity, Urine: 1.019 (ref 1.005–1.030)
pH: 5 (ref 5.0–8.0)

## 2021-09-19 LAB — CBC
HCT: 32.9 % — ABNORMAL LOW (ref 36.0–46.0)
Hemoglobin: 10.5 g/dL — ABNORMAL LOW (ref 12.0–15.0)
MCH: 29.4 pg (ref 26.0–34.0)
MCHC: 31.9 g/dL (ref 30.0–36.0)
MCV: 92.2 fL (ref 80.0–100.0)
Platelets: 307 10*3/uL (ref 150–400)
RBC: 3.57 MIL/uL — ABNORMAL LOW (ref 3.87–5.11)
RDW: 12.9 % (ref 11.5–15.5)
WBC: 10.4 10*3/uL (ref 4.0–10.5)
nRBC: 0 % (ref 0.0–0.2)

## 2021-09-19 LAB — BLOOD GAS, ARTERIAL
Acid-Base Excess: 9.1 mmol/L — ABNORMAL HIGH (ref 0.0–2.0)
Bicarbonate: 34.2 mmol/L — ABNORMAL HIGH (ref 20.0–28.0)
O2 Content: 2 L/min
O2 Saturation: 99 %
Patient temperature: 37
pCO2 arterial: 47 mmHg (ref 32–48)
pH, Arterial: 7.47 — ABNORMAL HIGH (ref 7.35–7.45)
pO2, Arterial: 78 mmHg — ABNORMAL LOW (ref 83–108)

## 2021-09-19 LAB — HEMOGLOBIN A1C
Hgb A1c MFr Bld: 6.4 % — ABNORMAL HIGH (ref 4.8–5.6)
Mean Plasma Glucose: 136.98 mg/dL

## 2021-09-19 LAB — HIV ANTIBODY (ROUTINE TESTING W REFLEX): HIV Screen 4th Generation wRfx: NONREACTIVE

## 2021-09-19 LAB — BASIC METABOLIC PANEL
Anion gap: 10 (ref 5–15)
BUN: 17 mg/dL (ref 8–23)
CO2: 29 mmol/L (ref 22–32)
Calcium: 8.4 mg/dL — ABNORMAL LOW (ref 8.9–10.3)
Chloride: 102 mmol/L (ref 98–111)
Creatinine, Ser: 0.81 mg/dL (ref 0.44–1.00)
GFR, Estimated: >60 mL/min (ref >60)
Glucose, Bld: 128 mg/dL — ABNORMAL HIGH (ref 70–99)
Potassium: 3.6 mmol/L (ref 3.5–5.1)
Sodium: 141 mmol/L (ref 135–145)

## 2021-09-19 LAB — MRSA NEXT GEN BY PCR, NASAL: MRSA by PCR Next Gen: NOT DETECTED

## 2021-09-19 LAB — GLUCOSE, CAPILLARY
Glucose-Capillary: 130 mg/dL — ABNORMAL HIGH (ref 70–99)
Glucose-Capillary: 108 mg/dL — ABNORMAL HIGH (ref 70–99)
Glucose-Capillary: 107 mg/dL — ABNORMAL HIGH (ref 70–99)
Glucose-Capillary: 122 mg/dL — ABNORMAL HIGH (ref 70–99)

## 2021-09-19 MED ORDER — FAMOTIDINE 20 MG PO TABS
20.0000 mg | ORAL_TABLET | Freq: Every morning | ORAL | Status: DC
  Administered 2021-09-20 – 2021-09-26 (×7): 20 mg via ORAL
  Filled 2021-09-19 (×7): qty 1

## 2021-09-19 MED ORDER — MEMANTINE HCL ER 7 MG PO CP24
21.0000 mg | ORAL_CAPSULE | Freq: Every morning | ORAL | Status: DC
  Administered 2021-09-20 – 2021-09-26 (×7): 21 mg via ORAL
  Filled 2021-09-19 (×8): qty 1

## 2021-09-19 MED ORDER — GUAIFENESIN 100 MG/5ML PO LIQD
10.0000 mL | Freq: Four times a day (QID) | ORAL | Status: DC | PRN
Start: 2021-09-19 — End: 2021-09-26

## 2021-09-19 MED ORDER — LEVOTHYROXINE SODIUM 50 MCG PO TABS
50.0000 ug | ORAL_TABLET | Freq: Every day | ORAL | Status: DC
Start: 2021-09-20 — End: 2021-09-26
  Administered 2021-09-21 – 2021-09-26 (×5): 50 ug via ORAL
  Filled 2021-09-19 (×5): qty 1

## 2021-09-19 MED ORDER — OLANZAPINE 5 MG PO TABS
2.5000 mg | ORAL_TABLET | Freq: Every day | ORAL | Status: DC
  Administered 2021-09-20 – 2021-09-26 (×5): 2.5 mg via ORAL
  Filled 2021-09-19 (×7): qty 1

## 2021-09-19 MED ORDER — BUSPIRONE HCL 5 MG PO TABS
5.0000 mg | ORAL_TABLET | Freq: Two times a day (BID) | ORAL | Status: DC
  Administered 2021-09-19 – 2021-09-26 (×10): 5 mg via ORAL
  Filled 2021-09-19 (×13): qty 1

## 2021-09-19 MED ORDER — PNEUMOCOCCAL 20-VAL CONJ VACC 0.5 ML IM SUSY
0.5000 mL | PREFILLED_SYRINGE | INTRAMUSCULAR | Status: DC
  Filled 2021-09-19: qty 0.5

## 2021-09-19 MED ORDER — LISINOPRIL 5 MG PO TABS
5.0000 mg | ORAL_TABLET | Freq: Every morning | ORAL | Status: DC
  Administered 2021-09-20 – 2021-09-26 (×7): 5 mg via ORAL
  Filled 2021-09-19 (×7): qty 1

## 2021-09-19 MED ORDER — VENLAFAXINE HCL ER 37.5 MG PO CP24
37.5000 mg | ORAL_CAPSULE | Freq: Every morning | ORAL | Status: DC
  Administered 2021-09-20 – 2021-09-26 (×7): 37.5 mg via ORAL
  Filled 2021-09-19 (×8): qty 1

## 2021-09-19 NOTE — Progress Notes (Signed)
Staff unable to safely transfer patient from bed to chair today due to patient not cooperating. Patient is able to stand and walk at baseline which she did this morning with grand daughter present but did not comply with staff this afternoon. Patient sitting upright in bed with eyes closed.

## 2021-09-19 NOTE — Evaluation (Signed)
Clinical/Bedside Swallow Evaluation Patient Details  Name: Paula Andrews MRN: [id number] Date of Birth: 1945-05-08  Today's Date: 09/19/2021 Time: SLP Start Time (ACUTE ONLY): 1440 SLP Stop Time (ACUTE ONLY): 1455 SLP Time Calculation (min) (ACUTE ONLY): 15 min  Past Medical History:  Past Medical History:  Diagnosis Date   Dementia ([place])    Diabetes mellitus    Hypertension    Thyroid disease    Past Surgical History:  Past Surgical History:  Procedure Laterality Date   ABDOMINAL HYSTERECTOMY     HPI:  Patient is a 76 y.o. female with PMH: DM, dementia, HTN, hypothyroidism, GERD, HLD, COPD, previous metabolic encephalopathy who was brought to the hospital from her SNF [facility]) with SOB, hypoxic 88% on RA. ED workup showed hyperkalemia, mild leukocytosis, CXR showed interval development of diffuse interstitial infiltrate with more focal consolidation within the right lower lobe. She was started on IV antibiotics and admitted with right lower lobe PNA and acute respiratory failure with hypoxia.    Assessment / Plan / Recommendation  Clinical Impression  Patient currently presenting with what appears to be a primarily cognitive-based dysphagia. Although some congested coughing was observed occasionally, this did not appear to correlate with PO intake. Per granddaughter, patient's cough was deeper yesterday than it is today and seems to be from the PNA. Patient ate small amounts of italian ice at a time but did exhibit timely swallow initiation and no significant oral delays (also no holding of PO's). Patient did not speak often but when she did her voice was clear. Family reporting that patient already preferred sweets before dementia diagnosis. Spouse reported that patient was diagnosed with dementia in 2017 and after 4 years of trying to take care of her at home, he was no longer able to and patient has been a resident of [facility]. Family reported  that patient typically has a very good appetite. They are understanding of possibility that patient's current state could be progression of dementia versus exacerbation from PNA. SLP will follow briefly to ensure patient is on least restrictive PO diet. SLP Visit Diagnosis: Dysphagia, unspecified (R13.10)    Aspiration Risk  Mild aspiration risk;No limitations    Diet Recommendation Regular;Thin liquid   Liquid Administration via: Straw;Cup Medication Administration: Whole meds with puree Supervision: Full supervision/cueing for compensatory strategies;Staff to assist with self feeding Compensations: Slow rate;Small sips/bites;Minimize environmental distractions Postural Changes: Seated upright at 90 degrees    Other  Recommendations Oral Care Recommendations: Oral care BID    Recommendations for follow up therapy are one component of a multi-disciplinary discharge planning process, led by the attending physician.  Recommendations may be updated based on patient status, additional functional criteria and insurance authorization.  Follow up Recommendations Skilled nursing-short term rehab (<3 hours/day)      Assistance Recommended at Discharge Frequent or constant Supervision/Assistance  Functional Status Assessment Patient has had a recent decline in their functional status and demonstrates the ability to make significant improvements in function in a reasonable and predictable amount of time.  Frequency and Duration min 1 x/week  1 week       Prognosis Prognosis for Safe Diet Advancement: Good Barriers/Prognosis Comment: patient's spouse, daughter, granddaughter      Britt Bolognese   General Date of Onset: 09/18/21 HPI: Patient is a 76 y.o. female with PMH: DM, dementia, HTN, hypothyroidism, GERD, HLD, COPD, previous metabolic encephalopathy who was brought to the hospital from her SNF [facility]) with  SOB, hypoxic 88% on RA. ED workup showed hyperkalemia, mild leukocytosis,  CXR showed interval development of diffuse interstitial infiltrate with more focal consolidation within the right lower lobe. She was started on IV antibiotics and admitted with right lower lobe PNA and acute respiratory failure with hypoxia. Type of Study: Bedside Swallow Evaluation Previous Swallow Assessment: none found Diet Prior to this Study: Regular;Thin liquids Temperature Spikes Noted: No Respiratory Status: Nasal cannula History of Recent Intubation: No Behavior/Cognition: Alert;Pleasant mood;Confused;Cooperative Oral Cavity Assessment: Other (comment) (not fully assessed) Oral Care Completed by SLP: No Oral Cavity - Dentition: Adequate natural dentition Self-Feeding Abilities: Total assist Patient Positioning: Upright in bed Baseline Vocal Quality: Normal Volitional Cough: Cognitively unable to elicit Volitional Swallow: Unable to elicit    Oral/Motor/Sensory Function Overall Oral Motor/Sensory Function: Other (comment) (no observed oral motor dysfunction but patient unable to follow directions for complete assessment)   Ice Chips     Thin Liquid Thin Liquid: Within functional limits Other Comments: observed with italian ice which melted to approximately thin liquid    Nectar Thick     Honey Thick     Puree Puree: Not tested   Solid     Solid: Not tested Other Comments: chicken salad sandwich on tray but family reported she would not eat it      Sonia Baller, MA, CCC-SLP Speech Therapy

## 2021-09-19 NOTE — Hospital Course (Addendum)
76 y.o. female with history of diabetes, dementia, hypertension, hypothyroidism, GERD, hyperlipidemia, COPD, previous metabolic encephalopathy, brought in with shortness of breath, hypoxic 88% on room air, history limited due to dementia.  ED further work-up showed hyperkalemia, mild leukocytosis normal lactic acid mildly elevated total bili 1.4 AST 48, UA WBC 11-20, nitrate negative leukocytes moderate. Chest x-ray interval development of diffuse interstitial infiltrate with more focal consolidation within the right lower lobe patient is placed on temporizing measures and Lokelma for hyperkalemia and ceftriaxone azithromycin and admitted for further management

## 2021-09-19 NOTE — Progress Notes (Signed)
RN unable to administer some PO medications today d/t patient not wanting food. Patient will open her mouth very little and take a tiny bite from spoon. Patient alert but not opening her eyes or following commands. Patient does verbally respond to her name and answer "just fine I think" when asked how she is doing.

## 2021-09-19 NOTE — Progress Notes (Signed)
PROGRESS NOTE Paula Andrews  FIE:[id number] DOB: 08-13-1945 DOA: 09/18/2021 PCP: Lajean Manes, MD   Brief Narrative/Hospital Course: 76 y.o. female with history of diabetes, dementia, hypertension, hypothyroidism, GERD, hyperlipidemia, COPD, previous metabolic encephalopathy, brought in with shortness of breath, hypoxic 88% on room air, history limited due to dementia.  ED further work-up showed hyperkalemia, mild leukocytosis normal lactic acid mildly elevated total bili 1.4 AST 48, UA WBC 11-20, nitrate negative leukocytes moderate. Chest x-ray interval development of diffuse interstitial infiltrate with more focal consolidation within the right lower lobe patient is placed on temporizing measures and Lokelma for hyperkalemia and ceftriaxone azithromycin and admitted for further management   Subjective: Seen and examined.  Granddaughter was EMT at [facility] at bedside Settings patient is not currently at baseline and she is more lethargicat baseline AAOX1 On 2l [place].Did wake up for me smiling pleasant no complaints  Assessment and Plan: Principal Problem:   Acute hypoxemic respiratory failure (HCC) Active Problems:   HTN (hypertension)   Diabetes mellitus type II, controlled ([place])   HLD (hyperlipidemia)   COPD with acute exacerbation (HCC)   Acute respiratory failure with hypoxia (HCC)   Acute encephalopathy   Dementia (HCC)   Hypothyroidism   Lobar pneumonia ([place])   Leucocytosis   Hyperkalemia   Right lower lobe pneumonia Acute respiratory failure with hypoxia: Based on her chest x-ray clinical presentation continue empiric treatment of pneumonia with ceftriaxone, azithromycin.  Sputum culture once able-but limited due to her dementia.  Continue bronchodilators, supplemental oxygen.  Speech eval to rule out aspiration given her dementia  COPD : Resume home inhaler bronchodilators supplemental oxygen as above  HTN Diabetes mellitus type II, controlled  HLD: BP is fairly  controlled.  Keep on SSI, resume antihypertensive  Acute metabolic encephalopathy in the setting of dementia: somewhat lethargic more than baseline normally AAO x1 at baseline.  Continue underlying infection treatment.  ABG obtained and no hypercapnia.  Resume Namenda, venlafaxine, BuSpar.  Hold melatonin  Hypothyroidism: Resume Synthroid 50 mcg  Hyperkalemia: Now resolved.  DVT prophylaxis: enoxaparin (LOVENOX) injection 40 mg Start: 09/18/21 2200 Code Status:   Code Status: Full Code Family Communication: plan of care discussed with patient and her granddaughter at bedside. Patient status is: Inpatient because of ongoing management of hypoxia, pneumonia, worsening mental status Level of care: Telemetry  Dispo: The patient is from: Nursing facility            Anticipated disposition: Nursing facility 1-2 days.  Obtain PT OT evaluation today  Mobility Assessment (last 72 hours)     Mobility Assessment     Row Name 09/19/21 0100           Does patient have an order for bedrest or is patient medically unstable No - Continue assessment       What is the highest level of mobility based on the progressive mobility assessment? Level 4 (Walks with assist in room) - Balance while marching in place and cannot step forward and back - Complete              Objective: Vitals last 24 hrs: Vitals:   09/18/21 1800 09/18/21 1936 09/18/21 2332 09/19/21 0426  BP: (!) 125/54 125/64 (!) 145/66 (!) 153/75  Pulse: 85 91 87 88  Resp: '20 16 16 16  '$ Temp:  98.9 F (37.2 C) 98.9 F (37.2 C) 99.3 F (37.4 C)  TempSrc:  Oral Oral Oral  SpO2: 95% 96% 97% 96%  Weight:  70 kg  Weight change:   Physical Examination: General exam: Lethargic weak frail elderly  HEENT:Oral mucosa moist, Ear/Nose WNL grossly, dentition normal. Respiratory system: bilaterally diminished BS, no use of accessory muscle Cardiovascular system: S1 & S2 +, No JVD. Gastrointestinal system: Abdomen soft,NT,ND,  BS+ Nervous System:Alert, awake, moving extremities and grossly nonfocal Extremities: LE edema NEG,distal peripheral pulses palpable.  Skin: No rashes,no icterus. MSK: Normal muscle bulk,tone, power  Medications reviewed: Scheduled Meds:  enoxaparin (LOVENOX) injection  40 mg Subcutaneous Q24H   insulin aspart  0-15 Units Subcutaneous TID WC   insulin aspart  0-5 Units Subcutaneous QHS   [START ON 09/20/2021] pneumococcal 20-valent conjugate vaccine  0.5 mL Intramuscular Tomorrow-1000   Continuous Infusions:  sodium chloride 75 mL/hr at 09/19/21 1050   azithromycin     cefTRIAXone (ROCEPHIN)  IV      Diet Order             Diet heart healthy/carb modified Room service appropriate? Yes; Fluid consistency: Thin  Diet effective now                  Intake/Output Summary (Last 24 hours) at 09/19/2021 1310 Last data filed at 09/19/2021 1002 Gross per 24 hour  Intake 965.61 ml  Output 250 ml  Net 715.61 ml   Net IO Since Admission: 715.61 mL [09/19/21 1310]  Wt Readings from Last 3 Encounters:  09/18/21 70 kg  11/24/19 63.5 kg  03/23/18 77.1 kg     Unresulted Labs (From admission, onward)     Start     Ordered   09/25/21 0500  Creatinine, serum  (enoxaparin (LOVENOX)    CrCl >/= 30 ml/min)  Weekly,   R     Comments: while on enoxaparin therapy    09/18/21 1929          Data Reviewed: I have personally reviewed following labs and imaging studies CBC: Recent Labs  Lab 09/18/21 1647 09/18/21 2024 09/19/21 0654  WBC 11.2* 10.4 10.4  NEUTROABS 8.2*  --   --   HGB 10.6* 10.4* 10.5*  HCT 34.0* 33.1* 32.9*  MCV 93.4 94.3 92.2  PLT 268 243 371   Basic Metabolic Panel: Recent Labs  Lab 09/18/21 1647 09/18/21 2024 09/19/21 0654  NA 136  --  141  K 6.6*  --  3.6  CL 102  --  102  CO2 27  --  29  GLUCOSE 121*  --  128*  BUN 28*  --  17  CREATININE 1.09* 0.89 0.81  CALCIUM 8.8*  --  8.4*   GFR: CrCl cannot be calculated (Unknown ideal weight.). Liver  Function Tests: Recent Labs  Lab 09/18/21 1647  AST 48*  ALT 31  ALKPHOS 86  BILITOT 1.4*  PROT 7.0  ALBUMIN 3.4*   No results for input(s): "LIPASE", "AMYLASE" in the last 168 hours. No results for input(s): "AMMONIA" in the last 168 hours. Coagulation Profile: No results for input(s): "INR", "PROTIME" in the last 168 hours. BNP (last 3 results) No results for input(s): "PROBNP" in the last 8760 hours. HbA1C: Recent Labs    09/18/21 2024  HGBA1C 6.4*   CBG: Recent Labs  Lab 09/18/21 2155 09/19/21 0751 09/19/21 1222  GLUCAP 106* 130* 108*   Lipid Profile: No results for input(s): "CHOL", "HDL", "LDLCALC", "TRIG", "CHOLHDL", "LDLDIRECT" in the last 72 hours. Thyroid Function Tests: No results for input(s): "TSH", "T4TOTAL", "FREET4", "T3FREE", "THYROIDAB" in the last 72 hours. Sepsis Labs: Recent Labs  Lab 09/18/21 1647  09/18/21 2024  LATICACIDVEN 1.2 1.3    Recent Results (from the past 240 hour(s))  MRSA Next Gen by PCR, Nasal     Status: None   Collection Time: 09/19/21  1:49 AM   Specimen: Nasal Mucosa; Nasal Swab  Result Value Ref Range Status   MRSA by PCR Next Gen NOT DETECTED NOT DETECTED Final    Comment: (NOTE) The GeneXpert MRSA Assay (FDA approved for NASAL specimens only), is one component of a comprehensive MRSA colonization surveillance program. It is not intended to diagnose MRSA infection nor to guide or monitor treatment for MRSA infections. Test performance is not FDA approved in patients less than 9 years old. Performed at [facility], [place] [address], [place], [place] [address]     Antimicrobials: Anti-infectives (From admission, onward)    Start     Dose/Rate Route Frequency Ordered Stop   09/19/21 1800  cefTRIAXone (ROCEPHIN) 2 g in sodium chloride 0.9 % 100 mL IVPB        2 g 200 mL/hr over 30 Minutes Intravenous Every 24 hours 09/18/21 1929 09/24/21 1759   09/19/21 1800  azithromycin (ZITHROMAX) 500 mg in  sodium chloride 0.9 % 250 mL IVPB        500 mg 250 mL/hr over 60 Minutes Intravenous Every 24 hours 09/18/21 1929 09/24/21 1759   09/18/21 1815  azithromycin (ZITHROMAX) 500 mg in sodium chloride 0.9 % 250 mL IVPB        500 mg 250 mL/hr over 60 Minutes Intravenous  Once 09/18/21 1800 09/19/21 1050   09/18/21 1815  cefTRIAXone (ROCEPHIN) 2 g in sodium chloride 0.9 % 100 mL IVPB        2 g 200 mL/hr over 30 Minutes Intravenous  Once 09/18/21 1800 09/19/21 1051      Culture/Microbiology    Component Value Date/Time   SDES BLOOD LEFT HAND 03/23/2018 1043   SPECREQUEST  03/23/2018 1043    BOTTLES DRAWN AEROBIC AND ANAEROBIC Blood Culture adequate volume   CULT  03/23/2018 1043    NO GROWTH 5 DAYS Performed at [facility], [address], [place],  [address]    REPTSTATUS 03/28/2018 FINAL 03/23/2018 1043  Radiology Studies: DG Chest 2 View  Result Date: 09/18/2021 CLINICAL DATA:  Productive cough EXAM: CHEST - 2 VIEW COMPARISON:  09/18/2021 FINDINGS: Lung volumes are small, but are symmetric and are stable since prior examination. Diffuse mild interstitial infiltrate has developed with more focal consolidation within the right lower lobe in keeping with acute infection in the appropriate clinical setting. No pneumothorax or pleural effusion. Cardiac size within normal limits. Pulmonary vascularity is normal. IMPRESSION: Interval development of diffuse interstitial infiltrate with more focal consolidation within the right lower lobe in keeping with acute infection in the appropriate clinical setting. Electronically Signed   By: Fidela Salisbury M.D.   On: 09/18/2021 17:40   DG Chest Port 1 View  Result Date: 09/18/2021 CLINICAL DATA:  sob EXAM: PORTABLE CHEST 1 VIEW COMPARISON:  Chest x-ray 04/08/2018 FINDINGS: The heart and mediastinal contours are unchanged. Aortic calcification. Query retrocardiac airspace opacity. No pulmonary edema. No pleural effusion. No pneumothorax. No acute  osseous abnormality. IMPRESSION: Query retrocardiac airspace opacity. Recommend PA and lateral view of the chest. Electronically Signed   By: Iven Finn M.D.   On: 09/18/2021 16:36     LOS: 1 day   Antonieta Pert, MD [facility]  09/19/2021, 1:10 PM

## 2021-09-20 DIAGNOSIS — J9601 Acute respiratory failure with hypoxia: Secondary | ICD-10-CM | POA: Diagnosis not present

## 2021-09-20 LAB — GLUCOSE, CAPILLARY
Glucose-Capillary: 121 mg/dL — ABNORMAL HIGH (ref 70–99)
Glucose-Capillary: 128 mg/dL — ABNORMAL HIGH (ref 70–99)
Glucose-Capillary: 126 mg/dL — ABNORMAL HIGH (ref 70–99)
Glucose-Capillary: 140 mg/dL — ABNORMAL HIGH (ref 70–99)

## 2021-09-20 MED ORDER — AZITHROMYCIN 250 MG PO TABS
500.0000 mg | ORAL_TABLET | Freq: Every day | ORAL | Status: AC
  Administered 2021-09-20: 500 mg via ORAL
  Filled 2021-09-20 (×3): qty 2

## 2021-09-20 NOTE — Evaluation (Signed)
Occupational Therapy Evaluation Patient Details Name: Paula Andrews MRN: [id number] DOB: 13-Feb-1946 Today's Date: 09/20/2021   History of Present Illness 76 y.o. female with history of diabetes, dementia, hypertension, hypothyroidism, GERD, hyperlipidemia, COPD, previous metabolic encephalopathy, brought in with shortness of breath, hypoxic 88% on room air, history limited due to dementia.  ED further work-up showed hyperkalemia, mild leukocytosis. Dx of PNA, hyperkalemia.   Clinical Impression   Patient is a 76 year old female who was admitted for above. Patient was living at ALF with caregiver support prior level. Patient was min A for transfers with HHA with increased time and TD for hygiene tasks. Patient was noted to follow one step commands inconsistently. Patient was noted to have decreased functional activity tolerance, decreased endurance, decreased standing balance, decreased safety awareness, and decreased knowledge of AD/AE impacting participation in ADLs. Patient would continue to benefit from skilled OT services at this time while admitted and after d/c to address noted deficits in order to improve overall safety and independence in ADLs.          Recommendations for follow up therapy are one component of a multi-disciplinary discharge planning process, led by the attending physician.  Recommendations may be updated based on patient status, additional functional criteria and insurance authorization.   Follow Up Recommendations  Home health OT    Assistance Recommended at Discharge Frequent or constant Supervision/Assistance  Patient can return home with the following A little help with walking and/or transfers;A little help with bathing/dressing/bathroom;Assistance with cooking/housework;Direct supervision/assist for financial management;Assist for transportation;Help with stairs or ramp for entrance;Direct supervision/assist for medications management    Functional Status  Assessment  Patient has had a recent decline in their functional status and demonstrates the ability to make significant improvements in function in a reasonable and predictable amount of time.  Equipment Recommendations  None recommended by OT    Recommendations for Other Services       Precautions / Restrictions Precautions Precautions: Fall Restrictions Weight Bearing Restrictions: No      Mobility Bed Mobility Overal bed mobility: Needs Assistance Bed Mobility: Supine to Sit     Supine to sit: Min assist     General bed mobility comments: assist to initiate movement and to raise trunk    Transfers                          Balance Overall balance assessment: Needs assistance Sitting-balance support: Feet supported Sitting balance-Leahy Scale: Fair     Standing balance support: Single extremity supported, During functional activity Standing balance-Leahy Scale: Poor                             ADL either performed or assessed with clinical judgement   ADL Overall ADL's : Needs assistance/impaired Eating/Feeding: Moderate assistance;Sitting Eating/Feeding Details (indicate cue type and reason): in recliner with increased time Grooming: Minimal assistance;Sitting;Wash/dry face   Upper Body Bathing: Minimal assistance;Sitting   Lower Body Bathing: Maximal assistance;Sit to/from stand   Upper Body Dressing : Minimal assistance;Sitting   Lower Body Dressing: Maximal assistance;Sit to/from stand   Toilet Transfer: Minimal assistance;+2 for safety/equipment Toilet Transfer Details (indicate cue type and reason): hand held assistance to transfer from edge of bed to 3 in1 commode and then to recliner in room Toileting- Clothing Manipulation and Hygiene: Maximal assistance;Sit to/from stand  Vision Baseline Vision/History: 1 Wears glasses Patient Visual Report: No change from baseline Additional Comments: difficult to  assess with cog level     Perception     Praxis      Pertinent Vitals/Pain Pain Assessment Pain Assessment: Faces Faces Pain Scale: No hurt     Hand Dominance Right   Extremity/Trunk Assessment Upper Extremity Assessment Upper Extremity Assessment: Difficult to assess due to impaired cognition   Lower Extremity Assessment Lower Extremity Assessment: Defer to PT evaluation   Cervical / Trunk Assessment Cervical / Trunk Assessment: Normal   Communication Communication Communication: Expressive difficulties   Cognition Arousal/Alertness: Awake/alert Behavior During Therapy: Flat affect Overall Cognitive Status: Impaired/Different from baseline Area of Impairment: Memory, Following commands, Safety/judgement, Awareness, Problem solving                       Following Commands: Follows one step commands inconsistently     Problem Solving: Slow processing, Decreased initiation, Difficulty sequencing, Requires verbal cues, Requires tactile cues General Comments: h/o dementia per chart, pt is more confused than her baseline per spouse. patient was unable to report her name but able to answer to correct name     General Comments       Exercises     Shoulder Instructions      Home Living Family/patient expects to be discharged to:: Assisted living                                        Prior Functioning/Environment Prior Level of Function : Independent/Modified Independent             Mobility Comments: per spouse, pt walked without AD indpendently at ALF ADLs Comments: patients spouse reported that she was feeding herself at ALF and had staff support for bathing/dressing tasks.        OT Problem List: Decreased strength;Decreased activity tolerance;Impaired balance (sitting and/or standing);Decreased safety awareness;Decreased knowledge of precautions;Decreased knowledge of use of DME or AE      OT Treatment/Interventions:  Self-care/ADL training;Therapeutic exercise;Neuromuscular education;Energy conservation;DME and/or AE instruction;Therapeutic activities;Balance training;Patient/family education    OT Goals(Current goals can be found in the care plan section) Acute Rehab OT Goals Patient Stated Goal: to get back to ALF per husband OT Goal Formulation: With patient/family Time For Goal Achievement: 10/04/21 Potential to Achieve Goals: Fair  OT Frequency: Min 2X/week    Co-evaluation PT/OT/SLP Co-Evaluation/Treatment: Yes Reason for Co-Treatment: For patient/therapist safety PT goals addressed during session: Mobility/safety with mobility OT goals addressed during session: ADL's and self-care      AM-PAC OT "6 Clicks" Daily Activity     Outcome Measure Help from another person eating meals?: A Little Help from another person taking care of personal grooming?: A Little Help from another person toileting, which includes using toliet, bedpan, or urinal?: A Lot Help from another person bathing (including washing, rinsing, drying)?: A Lot Help from another person to put on and taking off regular upper body clothing?: A Lot Help from another person to put on and taking off regular lower body clothing?: A Lot 6 Click Score: 14   End of Session Nurse Communication: Mobility status  Activity Tolerance: Patient tolerated treatment well Patient left: in chair;with call bell/phone within reach;with chair alarm set;with nursing/sitter in room;with family/visitor present  OT Visit Diagnosis: Unsteadiness on feet (R26.81);Other abnormalities of gait and mobility (R26.89);Muscle weakness (generalized) (  M62.81)                Time: 7517-0017 OT Time Calculation (min): 27 min Charges:  OT General Charges $OT Visit: 1 Visit OT Evaluation $OT Eval Moderate Complexity: 1 Mod  Jackelyn Poling OTR/L, MS Acute Rehabilitation Department Office# [phone] Pager# [phone]   Marcellina Millin 09/20/2021, 9:46 AM

## 2021-09-20 NOTE — Progress Notes (Signed)
  CONCERNING: Antibiotic IV to Oral Route Change Policy  RECOMMENDATION: This patient is receiving azithromycin by the intravenous route.  Based on criteria approved by the Pharmacy and Therapeutics Committee, the antibiotic(s) is/are being converted to the equivalent oral dose form(s).   DESCRIPTION: These criteria include: Patient being treated for a respiratory tract infection, urinary tract infection, cellulitis or clostridium difficile associated diarrhea if on metronidazole The patient is not neutropenic and does not exhibit a GI malabsorption state The patient is eating (either orally or via tube) and/or has been taking other orally administered medications for a least 24 hours The patient is improving clinically and has a Tmax < 100.5  If you have questions about this conversion, please contact the Pharmacy Department  []  ( [phone] )  [facility] []  ( [phone] )   [facility] []  ( [phone]  [facility] []  ( [phone] )  [facility] [x]  ( [phone] )  [facility]   

## 2021-09-20 NOTE — Evaluation (Signed)
Physical Therapy Evaluation Patient Details Name: Paula Andrews MRN: [id number] DOB: [birth year] Today's Date: 09/20/2021  History of Present Illness  76 y.o. female with history of diabetes, dementia, hypertension, hypothyroidism, GERD, hyperlipidemia, COPD, previous metabolic encephalopathy, brought in with shortness of breath, hypoxic 88% on room air, history limited due to dementia.  ED further work-up showed hyperkalemia, mild leukocytosis. Dx of PNA, hyperkalemia.  Clinical Impression  Pt admitted with above diagnosis. Min assist for bed mobility and transfer to 3 in 1 then to recliner. Pt soiled in large BM, she was able to stand for ~2 minutes for cleanup without buckling of LEs. Spouse reports she ambulated independently without an assistive device at an ALF at baseline.  Pt currently with functional limitations due to the deficits listed below (see PT Problem List). Pt will benefit from skilled PT to increase their independence and safety with mobility to allow discharge to the venue listed below.          Recommendations for follow up therapy are one component of a multi-disciplinary discharge planning process, led by the attending physician.  Recommendations may be updated based on patient status, additional functional criteria and insurance authorization.  Follow Up Recommendations Home health PT    Assistance Recommended at Discharge Intermittent Supervision/Assistance  Patient can return home with the following  A little help with walking and/or transfers;A little help with bathing/dressing/bathroom;Assist for transportation;Help with stairs or ramp for entrance;Direct supervision/assist for financial management;Direct supervision/assist for medications management;Assistance with cooking/housework    Equipment Recommendations None recommended by PT  Recommendations for Other Services       Functional Status Assessment Patient has had a recent decline in their functional status  and demonstrates the ability to make significant improvements in function in a reasonable and predictable amount of time.     Precautions / Restrictions Precautions Precautions: Fall Restrictions Weight Bearing Restrictions: No      Mobility  Bed Mobility Overal bed mobility: Needs Assistance Bed Mobility: Supine to Sit     Supine to sit: Min assist     General bed mobility comments: assist to initiate movement and to raise trunk    Transfers Overall transfer level: Needs assistance Equipment used: 2 person hand held assist Transfers: Sit to/from Stand, Bed to chair/wheelchair/BSC Sit to Stand: Min assist   Step pivot transfers: Min assist, +2 safety/equipment       General transfer comment: assist to rise and steady with HHA of 2 to take pivotal steps to bedside commode then to recliner. Verbal and tactile cues for safety/technique. Pt soiled in BM and was able to stand for ~2 minutes for cleanup.    Ambulation/Gait Ambulation/Gait assistance: Min assist, +2 safety/equipment Gait Distance (Feet): 3 Feet Assistive device: 2 person hand held assist Gait Pattern/deviations: Step-through pattern, Decreased stride length Gait velocity: decr     General Gait Details: 3' x 2 pivotal steps from bed to bedside commode, then to recliner. No buckling of LEs.  Stairs            Wheelchair Mobility    Modified Rankin (Stroke Patients Only)       Balance Overall balance assessment: Needs assistance Sitting-balance support: Feet supported Sitting balance-Leahy Scale: Fair     Standing balance support: Single extremity supported, During functional activity Standing balance-Leahy Scale: Poor                               Pertinent  Vitals/Pain Pain Assessment Breathing: normal Negative Vocalization: none Facial Expression: smiling or inexpressive Body Language: relaxed Consolability: no need to console PAINAD Score: 0    Home Living  Family/patient expects to be discharged to:: Assisted living                        Prior Function Prior Level of Function : Independent/Modified Independent             Mobility Comments: per spouse, pt walked without AD indpendently at ALF ADLs Comments: patients spouse reported that she was feeding herself at ALF and had staff support for bathing/dressing tasks.     Hand Dominance   Dominant Hand: Right    Extremity/Trunk Assessment   Upper Extremity Assessment Upper Extremity Assessment: Difficult to assess due to impaired cognition    Lower Extremity Assessment Lower Extremity Assessment: Defer to PT evaluation    Cervical / Trunk Assessment Cervical / Trunk Assessment: Normal  Communication   Communication: Expressive difficulties  Cognition Arousal/Alertness: Awake/alert Behavior During Therapy: Flat affect Overall Cognitive Status: Impaired/Different from baseline Area of Impairment: Memory, Following commands, Safety/judgement, Awareness, Problem solving                       Following Commands: Follows one step commands inconsistently     Problem Solving: Slow processing, Decreased initiation, Difficulty sequencing, Requires verbal cues, Requires tactile cues General Comments: h/o dementia per chart, pt is more confused than her baseline per spouse        General Comments      Exercises     Assessment/Plan    PT Assessment Patient needs continued PT services  PT Problem List Decreased mobility;Decreased activity tolerance       PT Treatment Interventions Gait training;Therapeutic exercise;Functional mobility training;Patient/family education;Therapeutic activities    PT Goals (Current goals can be found in the Care Plan section)  Acute Rehab PT Goals Patient Stated Goal: return to ALF PT Goal Formulation: With family Time For Goal Achievement: 10/04/21 Potential to Achieve Goals: Good    Frequency Min 3X/week      Co-evaluation PT/OT/SLP Co-Evaluation/Treatment: Yes Reason for Co-Treatment: For patient/therapist safety PT goals addressed during session: Mobility/safety with mobility OT goals addressed during session: ADL's and self-care       AM-PAC PT "6 Clicks" Mobility  Outcome Measure Help needed turning from your back to your side while in a flat bed without using bedrails?: A Little Help needed moving from lying on your back to sitting on the side of a flat bed without using bedrails?: A Little Help needed moving to and from a bed to a chair (including a wheelchair)?: A Lot Help needed standing up from a chair using your arms (e.g., wheelchair or bedside chair)?: A Lot Help needed to walk in hospital room?: A Lot Help needed climbing 3-5 steps with a railing? : Total 6 Click Score: 13    End of Session Equipment Utilized During Treatment: Gait belt Activity Tolerance: Patient tolerated treatment well Patient left: in chair;with call bell/phone within reach;with chair alarm set Nurse Communication: Mobility status PT Visit Diagnosis: Difficulty in walking, not elsewhere classified (R26.2)    Time: 9323-5573 PT Time Calculation (min) (ACUTE ONLY): 16 min   Charges:   PT Evaluation $PT Eval Moderate Complexity: 1 Mod         Philomena Doheny PT 09/20/2021  Acute Rehabilitation Services Pager [phone] Office [phone]

## 2021-09-20 NOTE — TOC Initial Note (Signed)
Transition of Care [facility]) - Initial/Assessment Note    Patient Details  Name: Paula Andrews MRN: [id number] Date of Birth: [birth year]  Transition of Care [facility]) CM/SW Contact:    Damiana Berrian, Marjie Skiff, RN Phone Number: 09/20/2021, 3:52 PM  Clinical Narrative:                 Pt is from memory care at [facility] and will return there at dc. TOC will follow along for dc planning.  Expected Discharge Plan: Memory Care Barriers to Discharge: Continued Medical Work up   Patient Goals and CMS Choice Patient states their goals for this hospitalization and ongoing recovery are:: Dementia      Expected Discharge Plan and Services Expected Discharge Plan: Memory Care   Discharge Planning Services: CM Consult   Living arrangements for the past 2 months: [place] (Memory care)                   Prior Living Arrangements/Services Living arrangements for the past 2 months: [place] (Memory care) Lives with:: Facility Resident Patient language and need for interpreter reviewed:: Yes        Need for Family Participation in Patient Care: Yes (Comment) Care giver support system in place?: Yes (comment)   Criminal Activity/Legal Involvement Pertinent to Current Situation/Hospitalization: No - Comment as needed  Activities of Daily Living Home Assistive Devices/Equipment: None ADL Screening (condition at time of admission) Patient's cognitive ability adequate to safely complete daily activities?: No Is the patient deaf or have difficulty hearing?: Yes Does the patient have difficulty seeing, even when wearing glasses/contacts?: No Does the patient have difficulty concentrating, remembering, or making decisions?: Yes Patient able to express need for assistance with ADLs?: Yes Does the patient have difficulty dressing or bathing?: Yes Independently performs ADLs?: No Communication: Independent Dressing (OT): Needs assistance Is this a change from baseline?:  Pre-admission baseline Grooming: Needs assistance Is this a change from baseline?: Pre-admission baseline Feeding: Independent Bathing: Needs assistance Is this a change from baseline?: Pre-admission baseline Toileting: Needs assistance Is this a change from baseline?: Pre-admission baseline In/Out Bed: Needs assistance Is this a change from baseline?: Pre-admission baseline Walks in Home: Independent Does the patient have difficulty walking or climbing stairs?: Yes Weakness of Legs: Both Weakness of Arms/Hands: Both  Permission Sought/Granted                  Emotional Assessment Appearance:: Appears stated age       Alcohol / Substance Use: Not Applicable Psych Involvement: No (comment)  Admission diagnosis:  Hyperkalemia [E87.5] Acute hypoxemic respiratory failure ([place]) [J96.01] Community acquired pneumonia of right middle lobe of lung [J18.9] Patient Active Problem List   Diagnosis Date Noted   Leucocytosis 09/18/2021   Hyperkalemia 09/18/2021   Acute hypoxemic respiratory failure ([place]) 09/18/2021   HCAP (healthcare-associated pneumonia)    Enterobacter sepsis ([place]) 03/24/2018   Sepsis due to undetermined organism ([place]) 03/23/2018   Lobar pneumonia ([place]) 03/23/2018   Bacteremia due to Gram-negative bacteria 03/23/2018   Gram negative sepsis ([place]) 03/23/2018   Pulmonary embolus ([place])    Acute respiratory failure with hypoxia ([place]) 03/13/2018   Acute encephalopathy 03/13/2018   Fall 03/13/2018   Sinus bradycardia 03/13/2018   Dementia ([place]) 03/13/2018   Hypothyroidism 03/13/2018   Mood disorder ([place]) 03/13/2018   COPD with acute exacerbation ([place]) 08/07/2017   Adjustment 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 05/31/2017   Chronic respiratory failure with hypoxia and  hypercapnia ([place])    Acute respiratory failure with hypoxia and hypercapnia ([place]) 05/30/2017   Acute lower UTI 05/30/2017   HTN (hypertension) 05/30/2017   Diabetes mellitus  type II, controlled ([place]) 05/30/2017   HLD (hyperlipidemia) 05/30/2017   UTI (urinary tract infection) 05/30/2017   Hypoxia 05/29/2017   Hyponatremia 05/16/2017   Fracture of radial neck, left, closed 01/29/2012   CLOSED FRACTURE UNSPEC PART UPPER END HUMERUS 04/13/2010   PCP:  Lajean Manes, MD Pharmacy:  No Pharmacies Listed    Social Determinants of Health (SDOH) Interventions    Readmission Risk Interventions    09/20/2021    3:04 PM  Readmission Risk Prevention Plan  Transportation Screening Complete  PCP or Specialist Appt within 5-7 Days Complete  Home Care Screening Complete  Medication Review (RN CM) Complete

## 2021-09-20 NOTE — Progress Notes (Signed)
Speech Language Pathology Treatment: Dysphagia  Patient Details Name: Paula Andrews MRN: [id number] DOB: [birth year] Today's Date: 09/20/2021 Time: 1340-1350 SLP Time Calculation (min) (ACUTE ONLY): 10 min  Assessment / Plan / Recommendation Clinical Impression  Pt seen for skilled SLP to address her dysphagia goals. She is fully alert and willingly consumed water and meat- with adequate mastication and appearance of timely audible swallow.  3 ounce [place] was administered but pt unable to sequentially swallow 3 ounces - required 3 rest breaks. No coughing noted with yale or with po.  Pt was coughing upon SlP entrance to room and when SLP returned with a 3 ounce cup for swallow screen. Pt then admitted she coughs sometimes with intake - pointing to chest to indicate area of retention and admits to reflux.  She states "It's a lot worse then it used to be" -referring to GERD.    She has been on a PPI (protonix 40 mg) in the past and currently is taking Pepcid 20 mg daily.  Of note, pt also with prior h/o concern for potential reflux esophagitis due to mild diffuse esophageal wall thickening seen on image 03/2018.  Suspect pt's primary dysphagia/aspiration risk is reflux related given her report and known history.  Recommend small frequent meals daily and staying upright after meals.   No family present to educate, thus will pass information on to the pt's nurse.    HPI HPI: Patient is a 76 y.o. female with PMH: DM, dementia, HTN, hypothyroidism, GERD, HLD, COPD, previous metabolic encephalopathy who was brought to the hospital from her SNF [facility]) with SOB, hypoxic 88% on RA. ED workup showed hyperkalemia, mild leukocytosis, CXR showed interval development of diffuse interstitial infiltrate with more focal consolidation within the right lower lobe. She was started on IV antibiotics and admitted with right lower lobe PNA and acute respiratory failure with hypoxia.      SLP Plan  All goals met       Recommendations for follow up therapy are one component of a multi-disciplinary discharge planning process, led by the attending physician.  Recommendations may be updated based on patient status, additional functional criteria and insurance authorization.    Recommendations  Diet recommendations: Regular;Thin liquid Liquids provided via: Cup;Straw Medication Administration: Whole meds with puree Compensations: Slow rate;Small sips/bites;Minimize environmental distractions Postural Changes and/or Swallow Maneuvers: Seated upright 90 degrees;Upright 30-60 min after meal                Oral Care Recommendations: Oral care BID Follow Up Recommendations: Follow physician's recommendations for discharge plan and follow up therapies Assistance recommended at discharge: Frequent or constant Supervision/Assistance SLP Visit Diagnosis: Dysphagia, unspecified (R13.10) Plan: All goals met          Kathleen Lime, MS [place] Office [phone] Pager [phone]  Paula Andrews  09/20/2021, 2:05 PM

## 2021-09-20 NOTE — Progress Notes (Signed)
Patient walked 129f with front wheel walker. Staff used front wheel walker d/t patient initially unsteady on her feet when stood up from sitting position. While walking oxygen saturation on 2L [place] 96%, 1L [place] 92, and 88% on room air. Patient resting O2 sat steady at 88% on room air. Patient tolerated walk well. . Patient sitting upright in recliner, oxygen 1L [place] sat 90%. RN will continue to wean patient off oxygen as tolerated.

## 2021-09-20 NOTE — Progress Notes (Signed)
PROGRESS NOTE Paula Andrews  TFT:[id number] DOB: 1945/10/01 DOA: 09/18/2021 PCP: Lajean Manes, MD   Brief Narrative/Hospital Course: 76 y.o. female with history of diabetes, dementia, hypertension, hypothyroidism, GERD, hyperlipidemia, COPD, previous metabolic encephalopathy, brought in with shortness of breath, hypoxic 88% on room air, history limited due to dementia.  ED further work-up showed hyperkalemia, mild leukocytosis normal lactic acid mildly elevated total bili 1.4 AST 48, UA WBC 11-20, nitrate negative leukocytes moderate. Chest x-ray interval development of diffuse interstitial infiltrate with more focal consolidation within the right lower lobe patient is placed on temporizing measures and Lokelma for hyperkalemia and ceftriaxone azithromycin and admitted for further management   Subjective: Seen and examined this morning earlier there is very weak and lethargic return checked after family arrived she was placed in bedside chair appears to have perked up, still needing oxygen  Able to tell me her name  Yesterday evening she could not do anything walking-as per family she does get sundowning in the evening  Assessment and Plan: Principal Problem:   Acute hypoxemic respiratory failure ([place]) Active Problems:   HTN (hypertension)   Diabetes mellitus type II, controlled ([place])   HLD (hyperlipidemia)   COPD with acute exacerbation ([place])   Acute respiratory failure with hypoxia ([place])   Acute encephalopathy   Dementia ([place])   Hypothyroidism   Lobar pneumonia ([place])   Leucocytosis   Hyperkalemia   Right lower lobe pneumonia Acute respiratory failure with hypoxia: Based on her chest x-ray clinical presentation continue empiric treatment of pneumonia with ceftriaxone, azithromycin.  Sputum culture once able-but limited due to her dementia.  Overall clinically improving, continue bronchodilators wean supplemental oxygen as tolerated encourage PT OT.  Speech eval appreciated due to  dementia to rule out aspiration  COPD: Not wheezing continue her bronchodilators supplemental oxygen as above  HTNBP fairly stable continue her lisinopril,  Diabetes mellitus type II, controlled: Sugar stable, continue sliding scale insulin  Acute metabolic encephalopathy in the setting of dementia Dehydration/deconditioning: Getting more perked up continue to mobilize with PT OT.normally AAO x1 at baseline.  Continue underlying infection treatment.  ABG obtained and no hypercapnia.  Continue IV hydration, continue home Namenda, venlafaxine, BuSpar.  Hold melatonin.  Improving overall  Hypothyroidism: Continue Synthroid 50 mcg  Hyperkalemia: Now resolved.  DVT prophylaxis: enoxaparin (LOVENOX) injection 40 mg Start: 09/18/21 2200 Code Status:   Code Status: Full Code Family Communication: plan of care discussed with patient's son and granddaughter ( EMT) at the bedside again today Patient status is: Inpatient because of ongoing management of hypoxia, pneumonia, worsening mental status Level of care: Telemetry  Dispo: The patient is from: Nursing facility            Anticipated disposition: Anticipate discharge back to facility tomorrow if respiratory status stable and able to come off oxygen   Mobility Assessment (last 72 hours)     Mobility Assessment     Row Name 09/20/21 0945 09/20/21 0940 09/19/21 0100       Does patient have an order for bedrest or is patient medically unstable -- -- No - Continue assessment     What is the highest level of mobility based on the progressive mobility assessment? Level 4 (Walks with assist in room) - Balance while marching in place and cannot step forward and back - Complete Level 4 (Walks with assist in room) - Balance while marching in place and cannot step forward and back - Complete Level 4 (Walks with assist in room) - Balance  while marching in place and cannot step forward and back - Complete            Objective: Vitals last 24  hrs: Vitals:   09/19/21 0426 09/19/21 1320 09/19/21 2014 09/20/21 0436  BP: (!) 153/75 (!) 157/96 (!) 157/81 (!) 154/81  Pulse: 88 93 93 91  Resp: '16 20 18 16  '$ Temp: 99.3 F (37.4 C) 98.1 F (36.7 C) 98.9 F (37.2 C) 98.6 F (37 C)  TempSrc: Oral Oral Oral Oral  SpO2: 96% 94% 95% 94%  Weight:       Weight change:   Physical Examination: General exam: AA0x1, ill looking, frail, older than stated age, weak appearing. HEENT:Oral mucosa moist, Ear/Nose WNL grossly, dentition normal. Respiratory system: bilaterally diminished, no use of accessory muscle Cardiovascular system: S1 & S2 +, No JVD,. Gastrointestinal system: Abdomen soft,NT,ND,BS+ Nervous System:Alert, awake, moving extremities and grossly nonfocal Extremities: LE ankle edema neg, distal peripheral pulses palpable.  Skin: No rashes,no icterus. MSK: Normal muscle bulk,tone, power   Medications reviewed: Scheduled Meds:  busPIRone  5 mg Oral BID WC   enoxaparin (LOVENOX) injection  40 mg Subcutaneous Q24H   famotidine  20 mg Oral q morning   insulin aspart  0-15 Units Subcutaneous TID WC   insulin aspart  0-5 Units Subcutaneous QHS   levothyroxine  50 mcg Oral Q0600   lisinopril  5 mg Oral q morning   memantine  21 mg Oral q morning   OLANZapine  2.5 mg Oral Q1400   pneumococcal 20-valent conjugate vaccine  0.5 mL Intramuscular Tomorrow-1000   venlafaxine XR  37.5 mg Oral q morning   Continuous Infusions:  sodium chloride 75 mL/hr at 09/20/21 0220   azithromycin 500 mg (09/19/21 1916)   cefTRIAXone (ROCEPHIN)  IV 2 g (09/19/21 1745)    Diet Order             Diet heart healthy/carb modified Room service appropriate? Yes; Fluid consistency: Thin  Diet effective now                  Intake/Output Summary (Last 24 hours) at 09/20/2021 1058 Last data filed at 09/20/2021 0500 Gross per 24 hour  Intake 614.43 ml  Output 1100 ml  Net -485.57 ml    Net IO Since Admission: 230.04 mL [09/20/21 1058]  Wt  Readings from Last 3 Encounters:  09/18/21 70 kg  11/24/19 63.5 kg  03/23/18 77.1 kg     Unresulted Labs (From admission, onward)     Start     Ordered   09/25/21 0500  Creatinine, serum  (enoxaparin (LOVENOX)    CrCl >/= 30 ml/min)  Weekly,   R     Comments: while on enoxaparin therapy    09/18/21 1929          Data Reviewed: I have personally reviewed following labs and imaging studies CBC: Recent Labs  Lab 09/18/21 1647 09/18/21 2024 09/19/21 0654  WBC 11.2* 10.4 10.4  NEUTROABS 8.2*  --   --   HGB 10.6* 10.4* 10.5*  HCT 34.0* 33.1* 32.9*  MCV 93.4 94.3 92.2  PLT 268 243 277    Basic Metabolic Panel: Recent Labs  Lab 09/18/21 1647 09/18/21 2024 09/19/21 0654  NA 136  --  141  K 6.6*  --  3.6  CL 102  --  102  CO2 27  --  29  GLUCOSE 121*  --  128*  BUN 28*  --  17  CREATININE  1.09* 0.89 0.81  CALCIUM 8.8*  --  8.4*    GFR: CrCl cannot be calculated (Unknown ideal weight.). Liver Function Tests: Recent Labs  Lab 09/18/21 1647  AST 48*  ALT 31  ALKPHOS 86  BILITOT 1.4*  PROT 7.0  ALBUMIN 3.4*    No results for input(s): "LIPASE", "AMYLASE" in the last 168 hours. No results for input(s): "AMMONIA" in the last 168 hours. Coagulation Profile: No results for input(s): "INR", "PROTIME" in the last 168 hours. BNP (last 3 results) No results for input(s): "PROBNP" in the last 8760 hours. HbA1C: Recent Labs    09/18/21 2024  HGBA1C 6.4*    CBG: Recent Labs  Lab 09/19/21 0751 09/19/21 1222 09/19/21 1642 09/19/21 2114 09/20/21 0731  GLUCAP 130* 108* 107* 122* 121*    Lipid Profile: No results for input(s): "CHOL", "HDL", "LDLCALC", "TRIG", "CHOLHDL", "LDLDIRECT" in the last 72 hours. Thyroid Function Tests: No results for input(s): "TSH", "T4TOTAL", "FREET4", "T3FREE", "THYROIDAB" in the last 72 hours. Sepsis Labs: Recent Labs  Lab 09/18/21 1647 09/18/21 2024  LATICACIDVEN 1.2 1.3     Recent Results (from the past 240 hour(s))   MRSA Next Gen by PCR, Nasal     Status: None   Collection Time: 09/19/21  1:49 AM   Specimen: Nasal Mucosa; Nasal Swab  Result Value Ref Range Status   MRSA by PCR Next Gen NOT DETECTED NOT DETECTED Final    Comment: (NOTE) The GeneXpert MRSA Assay (FDA approved for NASAL specimens only), is one component of a comprehensive MRSA colonization surveillance program. It is not intended to diagnose MRSA infection nor to guide or monitor treatment for MRSA infections. Test performance is not FDA approved in patients less than 84 years old. Performed at [facility], [place] [address], [place], [place] [address]     Antimicrobials: Anti-infectives (From admission, onward)    Start     Dose/Rate Route Frequency Ordered Stop   09/19/21 1800  cefTRIAXone (ROCEPHIN) 2 g in sodium chloride 0.9 % 100 mL IVPB        2 g 200 mL/hr over 30 Minutes Intravenous Every 24 hours 09/18/21 1929 09/24/21 1759   09/19/21 1800  azithromycin (ZITHROMAX) 500 mg in sodium chloride 0.9 % 250 mL IVPB        500 mg 250 mL/hr over 60 Minutes Intravenous Every 24 hours 09/18/21 1929 09/24/21 1759   09/18/21 1815  azithromycin (ZITHROMAX) 500 mg in sodium chloride 0.9 % 250 mL IVPB        500 mg 250 mL/hr over 60 Minutes Intravenous  Once 09/18/21 1800 09/19/21 1050   09/18/21 1815  cefTRIAXone (ROCEPHIN) 2 g in sodium chloride 0.9 % 100 mL IVPB        2 g 200 mL/hr over 30 Minutes Intravenous  Once 09/18/21 1800 09/19/21 1051      Culture/Microbiology    Component Value Date/Time   SDES BLOOD LEFT HAND 03/23/2018 1043   SPECREQUEST  03/23/2018 1043    BOTTLES DRAWN AEROBIC AND ANAEROBIC Blood Culture adequate volume   CULT  03/23/2018 1043    NO GROWTH 5 DAYS Performed at [facility], [address], [place], [place] [address]    REPTSTATUS 03/28/2018 FINAL 03/23/2018 1043  Radiology Studies: DG Chest 2 View  Result Date: 09/18/2021 CLINICAL DATA:  Productive cough EXAM: CHEST - 2  VIEW COMPARISON:  09/18/2021 FINDINGS: Lung volumes are small, but are symmetric and are stable since prior examination. Diffuse mild interstitial  infiltrate has developed with more focal consolidation within the right lower lobe in keeping with acute infection in the appropriate clinical setting. No pneumothorax or pleural effusion. Cardiac size within normal limits. Pulmonary vascularity is normal. IMPRESSION: Interval development of diffuse interstitial infiltrate with more focal consolidation within the right lower lobe in keeping with acute infection in the appropriate clinical setting. Electronically Signed   By: Fidela Salisbury M.D.   On: 09/18/2021 17:40   DG Chest Port 1 View  Result Date: 09/18/2021 CLINICAL DATA:  sob EXAM: PORTABLE CHEST 1 VIEW COMPARISON:  Chest x-ray 04/08/2018 FINDINGS: The heart and mediastinal contours are unchanged. Aortic calcification. Query retrocardiac airspace opacity. No pulmonary edema. No pleural effusion. No pneumothorax. No acute osseous abnormality. IMPRESSION: Query retrocardiac airspace opacity. Recommend PA and lateral view of the chest. Electronically Signed   By: Iven Finn M.D.   On: 09/18/2021 16:36     LOS: 2 days   Antonieta Pert, MD [facility]  09/20/2021, 10:58 AM

## 2021-09-21 DIAGNOSIS — J9601 Acute respiratory failure with hypoxia: Secondary | ICD-10-CM | POA: Diagnosis not present

## 2021-09-21 LAB — GLUCOSE, CAPILLARY
Glucose-Capillary: 122 mg/dL — ABNORMAL HIGH (ref 70–99)
Glucose-Capillary: 112 mg/dL — ABNORMAL HIGH (ref 70–99)
Glucose-Capillary: 105 mg/dL — ABNORMAL HIGH (ref 70–99)
Glucose-Capillary: 135 mg/dL — ABNORMAL HIGH (ref 70–99)

## 2021-09-21 MED ORDER — AMOXICILLIN-POT CLAVULANATE 875-125 MG PO TABS
1.0000 | ORAL_TABLET | Freq: Two times a day (BID) | ORAL | Status: DC
  Administered 2021-09-21 – 2021-09-25 (×9): 1 via ORAL
  Filled 2021-09-21 (×9): qty 1

## 2021-09-21 NOTE — Care Management Important Message (Signed)
Important Message  Patient Details IM Letter placed in Patients room. Name: Paula Andrews MRN: [id number] Date of Birth: [birth year]   Medicare Important Message Given:  Yes     Kerin Salen 09/21/2021, 8:51 AM

## 2021-09-21 NOTE — Plan of Care (Signed)
Pt resting comfortably at this time.  Problem: Education: Goal: Ability to describe self-care measures that may prevent or decrease complications (Diabetes Survival Skills Education) will improve Outcome: Progressing Goal: Individualized Educational Video(s) Outcome: Progressing   Problem: Coping: Goal: Ability to adjust to condition or change in health will improve Outcome: Progressing   Problem: Fluid Volume: Goal: Ability to maintain a balanced intake and output will improve Outcome: Progressing   Problem: Health Behavior/Discharge Planning: Goal: Ability to identify and utilize available resources and services will improve Outcome: Progressing Goal: Ability to manage health-related needs will improve Outcome: Progressing   Problem: Metabolic: Goal: Ability to maintain appropriate glucose levels will improve Outcome: Progressing   Problem: Nutritional: Goal: Maintenance of adequate nutrition will improve Outcome: Progressing Goal: Progress toward achieving an optimal weight will improve Outcome: Progressing   Problem: Skin Integrity: Goal: Risk for impaired skin integrity will decrease Outcome: Progressing   Problem: Tissue Perfusion: Goal: Adequacy of tissue perfusion will improve Outcome: Progressing   Problem: Education: Goal: Knowledge of General Education information will improve Description: Including pain rating scale, medication(s)/side effects and non-pharmacologic comfort measures Outcome: Progressing   Problem: Health Behavior/Discharge Planning: Goal: Ability to manage health-related needs will improve Outcome: Progressing   Problem: Clinical Measurements: Goal: Ability to maintain clinical measurements within normal limits will improve Outcome: Progressing Goal: Will remain free from infection Outcome: Progressing Goal: Diagnostic test results will improve Outcome: Progressing Goal: Respiratory complications will improve Outcome:  Progressing Goal: Cardiovascular complication will be avoided Outcome: Progressing   Problem: Activity: Goal: Risk for activity intolerance will decrease Outcome: Progressing   Problem: Nutrition: Goal: Adequate nutrition will be maintained Outcome: Progressing   Problem: Coping: Goal: Level of anxiety will decrease Outcome: Progressing   Problem: Elimination: Goal: Will not experience complications related to bowel motility Outcome: Progressing Goal: Will not experience complications related to urinary retention Outcome: Progressing   Problem: Pain Managment: Goal: General experience of comfort will improve Outcome: Progressing   Problem: Safety: Goal: Ability to remain free from injury will improve Outcome: Progressing   Problem: Skin Integrity: Goal: Risk for impaired skin integrity will decrease Outcome: Progressing

## 2021-09-21 NOTE — Progress Notes (Signed)
, PROGRESS NOTE    Paula Andrews  PFX:[id number] DOB: [birth year] DOA: 09/18/2021 PCP: Lajean Manes, MD    Brief Narrative:  76 year old with history of diabetes, dementia, hypertension, hypothyroidism, GERD hyperlipidemia brought to the emergency room with shortness of breath, hypoxemic 88% on room air.  In the emergency room hyperkalemic, mild leukocytosis, UA with WBC 11-20.  Chest x-ray with interval development of diffuse interstitial infiltrate more within right lower lobe.  Admitted with right lower lobe pneumonia and hyperkalemia.   Assessment & Plan:   Right lower lobe pneumonia, acute hypoxemic respiratory failure, suspect aspiration pneumonia: Currently hemodynamically stable.  Able to wean off the oxygen today.  Treated with Rocephin and azithromycin.  Unable to participate on chest physiotherapy given advanced dementia. Clinically improving, unable to secure IV line without excess distress today, changed to Augmentin for total 7 days.  Seen by speech therapy, aspiration precautions but normal swallowing. Bronchodilators as needed.  Essential hypertension: Blood pressure stable on lisinopril.  Acute metabolic encephalopathy in the setting of dementia, dehydration deconditioning: Patient with advanced dementia at baseline.  Now with worsening mental status, poor recovery, poor oral intake. Remains on Namenda, venlafaxine and BuSpar and Zyprexa.  Melatonin on hold. Given her advancing dementia, she will benefit with palliative care discussion.  Hypothyroidism: Continue Synthroid.  Hypokalemia: Resolved.  Lisinopril resumed.  Patient is from assisted living facility.  She needed 2 person assist to mobilize.  She will need to work with physical therapy again to ensure that she can safely be transitioned to assisted living place. If unable to go to assisted living place, she will need SNF placement.   DVT prophylaxis: enoxaparin (LOVENOX) injection 40 mg Start: 09/18/21  2200   Code Status: Full code Family Communication: Daughter on the phone Disposition Plan: Status is: Inpatient Remains inpatient appropriate because: Persistent confusion, unsafe discharge planning.     Consultants:  Palliative care, consult called  Procedures:  None  Antimicrobials:  Rocephin azithromycin 6/18--6/21 Augmentin 6/21---   Subjective: Patient seen and examined.  Pleasantly confused.  Unable to interact.  No overnight events per nursing staff reported that she only ate 25% of the meal.  Able to wean off to room air.  Objective: Vitals:   09/20/21 2113 09/21/21 0528 09/21/21 0914 09/21/21 1413  BP: (!) 149/73 (!) 149/86 (!) 150/79 140/74  Pulse: 94 92  96  Resp: '16 20  18  '$ Temp: 99.7 F (37.6 C) 99 F (37.2 C)  98.2 F (36.8 C)  TempSrc: Oral Oral  Oral  SpO2: 94% 92%  91%  Weight:        Intake/Output Summary (Last 24 hours) at 09/21/2021 1515 Last data filed at 09/21/2021 0554 Gross per 24 hour  Intake 0 ml  Output 1600 ml  Net -1600 ml   Filed Weights   09/18/21 1936  Weight: 70 kg    Examination:  General: Alert and awake but not oriented.  Ill looking.  Frail and debilitated. Cardiovascular: S1-S2 normal.  Regular rate Paula. Respiratory: Bilateral clear.  No added sounds. Gastrointestinal: Soft.  Nontender.  Bowel sound present. Ext: No deformities.  No edema or cyanosis.      Data Reviewed: I have personally reviewed following labs and imaging studies  CBC: Recent Labs  Lab 09/18/21 1647 09/18/21 2024 09/19/21 0654  WBC 11.2* 10.4 10.4  NEUTROABS 8.2*  --   --   HGB 10.6* 10.4* 10.5*  HCT 34.0* 33.1* 32.9*  MCV 93.4 94.3 92.2  PLT 268  243 326   Basic Metabolic Panel: Recent Labs  Lab 09/18/21 1647 09/18/21 2024 09/19/21 0654  NA 136  --  141  K 6.6*  --  3.6  CL 102  --  102  CO2 27  --  29  GLUCOSE 121*  --  128*  BUN 28*  --  17  CREATININE 1.09* 0.89 0.81  CALCIUM 8.8*  --  8.4*   GFR: CrCl cannot be  calculated (Unknown ideal weight.). Liver Function Tests: Recent Labs  Lab 09/18/21 1647  AST 48*  ALT 31  ALKPHOS 86  BILITOT 1.4*  PROT 7.0  ALBUMIN 3.4*   No results for input(s): "LIPASE", "AMYLASE" in the last 168 hours. No results for input(s): "AMMONIA" in the last 168 hours. Coagulation Profile: No results for input(s): "INR", "PROTIME" in the last 168 hours. Cardiac Enzymes: No results for input(s): "CKTOTAL", "CKMB", "CKMBINDEX", "TROPONINI" in the last 168 hours. BNP (last 3 results) No results for input(s): "PROBNP" in the last 8760 hours. HbA1C: Recent Labs    09/18/21 2024  HGBA1C 6.4*   CBG: Recent Labs  Lab 09/20/21 1116 09/20/21 1607 09/20/21 2115 09/21/21 0805 09/21/21 1153  GLUCAP 128* 126* 140* 122* 112*   Lipid Profile: No results for input(s): "CHOL", "HDL", "LDLCALC", "TRIG", "CHOLHDL", "LDLDIRECT" in the last 72 hours. Thyroid Function Tests: No results for input(s): "TSH", "T4TOTAL", "FREET4", "T3FREE", "THYROIDAB" in the last 72 hours. Anemia Panel: No results for input(s): "VITAMINB12", "FOLATE", "FERRITIN", "TIBC", "IRON", "RETICCTPCT" in the last 72 hours. Sepsis Labs: Recent Labs  Lab 09/18/21 1647 09/18/21 2024  LATICACIDVEN 1.2 1.3    Recent Results (from the past 240 hour(s))  MRSA Next Gen by PCR, Nasal     Status: None   Collection Time: 09/19/21  1:49 AM   Specimen: Nasal Mucosa; Nasal Swab  Result Value Ref Range Status   MRSA by PCR Next Gen NOT DETECTED NOT DETECTED Final    Comment: (NOTE) The GeneXpert MRSA Assay (FDA approved for NASAL specimens only), is one component of a comprehensive MRSA colonization surveillance program. It is not intended to diagnose MRSA infection nor to guide or monitor treatment for MRSA infections. Test performance is not FDA approved in patients less than 49 years old. Performed at [facility], [place] [address] [place], [place] [address]          Radiology  Studies: No results found.      Scheduled Meds:  amoxicillin-clavulanate  1 tablet Oral Q12H   azithromycin  500 mg Oral q1800   busPIRone  5 mg Oral BID WC   enoxaparin (LOVENOX) injection  40 mg Subcutaneous Q24H   famotidine  20 mg Oral q morning   insulin aspart  0-15 Units Subcutaneous TID WC   insulin aspart  0-5 Units Subcutaneous QHS   levothyroxine  50 mcg Oral Q0600   lisinopril  5 mg Oral q morning   memantine  21 mg Oral q morning   OLANZapine  2.5 mg Oral Q1400   pneumococcal 20-valent conjugate vaccine  0.5 mL Intramuscular Tomorrow-1000   venlafaxine XR  37.5 mg Oral q morning   Continuous Infusions:   LOS: 3 days    Time spent: 35 minutes    Barb Merino, MD [facility] Pager [phone]

## 2021-09-22 DIAGNOSIS — Z7189 Other specified counseling: Secondary | ICD-10-CM | POA: Diagnosis not present

## 2021-09-22 DIAGNOSIS — J9601 Acute respiratory failure with hypoxia: Secondary | ICD-10-CM | POA: Diagnosis not present

## 2021-09-22 DIAGNOSIS — J189 Pneumonia, unspecified organism: Secondary | ICD-10-CM | POA: Diagnosis not present

## 2021-09-22 DIAGNOSIS — Z515 Encounter for palliative care: Secondary | ICD-10-CM | POA: Diagnosis not present

## 2021-09-22 DIAGNOSIS — F0390 Unspecified dementia without behavioral disturbance: Secondary | ICD-10-CM | POA: Diagnosis not present

## 2021-09-22 LAB — GLUCOSE, CAPILLARY
Glucose-Capillary: 125 mg/dL — ABNORMAL HIGH (ref 70–99)
Glucose-Capillary: 182 mg/dL — ABNORMAL HIGH (ref 70–99)
Glucose-Capillary: 100 mg/dL — ABNORMAL HIGH (ref 70–99)
Glucose-Capillary: 110 mg/dL — ABNORMAL HIGH (ref 70–99)

## 2021-09-22 NOTE — Progress Notes (Signed)
PT Cancellation Note  Patient Details Name: Paula Andrews MRN: [id number] DOB: 1945/09/12   Cancelled Treatment:    Reason Eval/Treat Not Completed: (P) Patient declined, no reason specified. Pt eating her lunch and enjoying it, pt's husband requesting PT return after lunch. We will follow up as schedule allows.   Coolidge Breeze, PT, DPT [facility] Office: [phone] Pager: [phone]   Coolidge Breeze 09/22/2021, 11:55 AM

## 2021-09-22 NOTE — Progress Notes (Signed)
Physical Therapy Treatment Patient Details Name: Paula Andrews MRN: [id number] DOB: 25-Nov-1945 Today's Date: 09/22/2021   History of Present Illness 76 y.o. female with history of diabetes, dementia, hypertension, hypothyroidism, GERD, hyperlipidemia, COPD, previous metabolic encephalopathy, brought in with shortness of breath, hypoxic 88% on room air, history limited due to dementia.  ED further work-up showed hyperkalemia, mild leukocytosis. Dx of PNA, hyperkalemia.    PT Comments    Pt very sleepy and lethargic upon entry, unable to rouse for any length of time even with sternal rub, but was able to waken enough to mobilize. Pt required mod assist +2 for bed mobility, min assist +2 for transfers and short distance ambulation in room. Trialed use of RW for ambulation but pt resistant, pt responded well to +2HHA though ambulated with shuffling gait. Pt in recliner at exit and already dozing off. Return to memory care unit with HHPT may continue to be the best thing from a PT standpoint as it will be a familiar environment considering dementia history, assuming memory care can provide appropriate assist and supervision. We will continue to follow acutely.    Recommendations for follow up therapy are one component of a multi-disciplinary discharge planning process, led by the attending physician.  Recommendations may be updated based on patient status, additional functional criteria and insurance authorization.  Follow Up Recommendations  Home health PT (Pt is not at her baseline level of function physically or cognitively (via chart review, no family member present). Return to memory care with HHPT may be beneficial given the familiarity of the environment if they can provide the level of care needed.)     Assistance Recommended at Discharge Frequent or constant Supervision/Assistance  Patient can return home with the following Assist for transportation;Help with stairs or ramp for  entrance;Direct supervision/assist for financial management;Direct supervision/assist for medications management;Assistance with cooking/housework;A lot of help with walking and/or transfers;A lot of help with bathing/dressing/bathroom;Assistance with feeding   Equipment Recommendations  None recommended by PT    Recommendations for Other Services       Precautions / Restrictions Precautions Precautions: Fall Restrictions Weight Bearing Restrictions: No     Mobility  Bed Mobility Overal bed mobility: Needs Assistance Bed Mobility: Supine to Sit     Supine to sit: Mod assist, +2 for physical assistance     General bed mobility comments: assist to initiate movement and to raise trunk    Transfers Overall transfer level: Needs assistance Equipment used: 2 person hand held assist, Rolling walker (2 wheels) Transfers: Sit to/from Stand Sit to Stand: Min assist           General transfer comment: Pt required min assist +2 for rising from bed using RW. Attempted to sit back down but was able to be redirected.    Ambulation/Gait Ambulation/Gait assistance: Min assist, +2 safety/equipment Gait Distance (Feet): 6 Feet Assistive device: 2 person hand held assist, Rolling walker (2 wheels) Gait Pattern/deviations: Step-through pattern, Decreased stride length, Shuffle, Narrow base of support Gait velocity: decr     General Gait Details: Attempted to take steps with RW min assist +2 but pt resistant and attempting to sit back down; trialed tactile and verbal cues to pt's feet with simple commands withot success. Removed walker and trialed [facility] with +2 for safety, pt able to walk 19feet very gingerly. Demonstrated narrow BOS, short step length, shuffling gait.   Stairs             Psychologist, prison and probation services  Modified Rankin (Stroke Patients Only)       Balance Overall balance assessment: Needs assistance Sitting-balance support: Feet supported Sitting balance-Leahy  Scale: Fair     Standing balance support: Single extremity supported, During functional activity Standing balance-Leahy Scale: Poor                              Cognition Arousal/Alertness: Lethargic Behavior During Therapy: Flat affect Overall Cognitive Status: No family/caregiver present to determine baseline cognitive functioning Area of Impairment: Memory, Following commands, Safety/judgement, Awareness, Problem solving                     Memory: Decreased short-term memory Following Commands: Follows one step commands consistently   Awareness: Intellectual Problem Solving: Slow processing, Decreased initiation, Difficulty sequencing, Requires verbal cues, Requires tactile cues General Comments: h/o dementia per chart, pt is more confused than her baseline per spouse. patient was able to state her name but might be her maiden last name, pt able to state month only of birth, day and year were incorrect, unable to name husband. Pt very, very lethargic, suspect sundowning behavior, only became truly alert during standing.        Exercises      General Comments        Pertinent Vitals/Pain Pain Assessment Pain Assessment: No/denies pain Faces Pain Scale: No hurt Breathing: normal Negative Vocalization: none Facial Expression: smiling or inexpressive Body Language: relaxed Consolability: no need to console PAINAD Score: 0    Home Living                          Prior Function            PT Goals (current goals can now be found in the care plan section) Acute Rehab PT Goals Patient Stated Goal: return to ALF PT Goal Formulation: With family Time For Goal Achievement: 10/04/21 Potential to Achieve Goals: Good Progress towards PT goals: Not progressing toward goals - comment (Pt's cognitive function is possibly impacting functional limitations)    Frequency    Min 3X/week      PT Plan Discharge plan needs to be updated     Co-evaluation              AM-PAC PT "6 Clicks" Mobility   Outcome Measure  Help needed turning from your back to your side while in a flat bed without using bedrails?: A Little Help needed moving from lying on your back to sitting on the side of a flat bed without using bedrails?: A Little Help needed moving to and from a bed to a chair (including a wheelchair)?: A Lot Help needed standing up from a chair using your arms (e.g., wheelchair or bedside chair)?: A Lot Help needed to walk in hospital room?: A Lot Help needed climbing 3-5 steps with a railing? : Total 6 Click Score: 13    End of Session Equipment Utilized During Treatment: Gait belt Activity Tolerance: Patient tolerated treatment well;No increased pain Patient left: in chair;with call bell/phone within reach;with chair alarm set Nurse Communication: Mobility status PT Visit Diagnosis: Difficulty in walking, not elsewhere classified (R26.2)     Time: 8119-1478 PT Time Calculation (min) (ACUTE ONLY): 18 min  Charges:  $Gait Training: 8-22 mins                     Jamesetta Geralds, PT,  DPT [facility] Rehabilitation Department Office: [phone] Pager: [phone]   Jamesetta Geralds 09/22/2021, 3:49 PM

## 2021-09-22 NOTE — Consult Note (Cosign Needed Addendum)
Consultation Note Date: 09/22/2021   Patient Name: Paula Andrews  DOB: 09-07-1945  MRN: [id number]  Age / Sex: 76 y.o., female  PCP: Paula Manes, MD Referring Physician: Barb Merino, MD  Reason for Consultation: Establishing goals of care  HPI/Patient Profile: 76 y.o. female  with past medical history of dementia, HTN, hypothyroidism, HLD, COPD, diabetes, GERD admitted on 09/18/2021 with fatigue and hypoxia related to pneumonia.   Clinical Assessment and Goals of Care: I met today at Paula Andrews's bedside along with her husband Paula Andrews and daughter Paula Andrews. Paula Andrews sleeps throughout my visit. She has struggled during hospitalization with lethargy and cooperating to take her medications as prescribed. I spoke with family who tell me that she has been living at [facility] for 2 years now. They report that prior to Sunday when she became ill she was able to walk independently (without walker or any assistance) around facility and maintained good appetite. She participated in many social activities including bingo appropriately. They describe her as very social. Her current status is a steep decline from her recent baseline prior to hospitalization.   We discussed her underlying dementia and how many factors could be leading to her declined state now including hypoactive delirium, infection/pneumonia, and in an unfamiliar environment. Unfortunately it is difficult to know how she will recover from this illness with her underlying dementia being an obstacle. Paula Andrews shares that he wants to hope for the best but acknowledging that this means preparing for the worse. They are hopeful to pursue rehab (vs therapy at memory care) to give her more time to see how she may be able to recover. They have already agreed to DNR for her and they do not want her to suffer. They are hopeful that she can recover to some level of  quality of life. Paula Andrews talks of other patients in her memory care unit that are just existing and have no quality of life and this is not what he would want for his wife and would not be what she would want for herself. We agree that only time will clarify what her future will look like. They agree with transition to SNF rehab and to follow up with outpatient palliative care to continue to conversation based on her outcomes and progress. I gave Paula Andrews copy of [facility] Choices booklet.   All questions/concerns addressed. Emotional support provided.    Primary Decision Maker NEXT OF KIN/HCPOA spouse Paula Andrews    SUMMARY OF RECOMMENDATIONS   - DNR in place - Outpatient palliative to follow - Time for outcomes  Code Status/Advance Care Planning: DNR   Symptom Management:  Per attending  Palliative Prophylaxis:  Aspiration, Bowel Regimen, Delirium Protocol, Frequent Pain Assessment, Oral Care, and Turn Reposition  Prognosis:  Overall prognosis poor with underlying dementia and significant decline due to acute illness. High risk for further decline.   Discharge Planning: [place] for rehab with Palliative care service follow-up      Primary Diagnoses: Present on Admission:  Acute encephalopathy  Acute respiratory failure with hypoxia (HCC)  COPD with acute exacerbation (HCC)  Dementia (HCC)  HLD (hyperlipidemia)  HTN (hypertension)  Lobar pneumonia (HCC)  Hypothyroidism  Leucocytosis  Hyperkalemia  Acute hypoxemic respiratory failure ([place])   I have reviewed the medical record, interviewed the patient and family, and examined the patient. The following aspects are pertinent.  Past Medical History:  Diagnosis Date   Dementia ([place])    Diabetes mellitus    Hypertension    Thyroid disease    Social History   Socioeconomic History   Marital status: Married    Spouse name: Not on file   Number of children: Not on file   Years of education: 9   Highest education  level: Not on file  Occupational History    Employer: RETIRED  Tobacco Use   Smoking status: Former    Packs/day: 1.00    Years: 41.00    Total pack years: 41.00    Types: Cigarettes    Quit date: 04/03/2001    Years since quitting: 20.4   Smokeless tobacco: Never  Vaping Use   Vaping Use: Never used  Substance and Sexual Activity   Alcohol use: No   Drug use: No   Sexual activity: Not on file  Other Topics Concern   Not on file  Social History Narrative   Not on file   Social Determinants of Health   Financial Resource Strain: Not on file  Food Insecurity: Not on file  Transportation Needs: Not on file  Physical Activity: Not on file  Stress: Not on file  Social Connections: Not on file   Family History  Problem Relation Age of Onset   Diabetes Other    Scheduled Meds:  amoxicillin-clavulanate  1 tablet Oral Q12H   azithromycin  500 mg Oral q1800   busPIRone  5 mg Oral BID WC   enoxaparin (LOVENOX) injection  40 mg Subcutaneous Q24H   famotidine  20 mg Oral q morning   insulin aspart  0-15 Units Subcutaneous TID WC   insulin aspart  0-5 Units Subcutaneous QHS   levothyroxine  50 mcg Oral Q0600   lisinopril  5 mg Oral q morning   memantine  21 mg Oral q morning   OLANZapine  2.5 mg Oral Q1400   pneumococcal 20-valent conjugate vaccine  0.5 mL Intramuscular Tomorrow-1000   venlafaxine XR  37.5 mg Oral q morning   Continuous Infusions: PRN Meds:.guaiFENesin No Known Allergies Review of Systems  Unable to perform ROS: Dementia    Physical Exam Vitals and nursing note reviewed.  Constitutional:      General: She is sleeping. She is not in acute distress.    Appearance: She is ill-appearing.  Cardiovascular:     Rate and Rhythm: Normal rate.  Pulmonary:     Effort: No tachypnea, accessory muscle usage or respiratory distress.  Abdominal:     General: Abdomen is flat.  Neurological:     Comments: Varied responses. She would not open her eyes (only briefly  after multiple requests). She would at times answer verbally with appropriate responses but not consistently. Per family and RN she has fluctuating mentation and was more awake and alert earlier.      Vital Signs: BP (!) 130/58   Pulse 87   Temp 98.5 F (36.9 C) (Oral)   Resp 20   Wt 70 kg   SpO2 93%   BMI 26.49 kg/m  Pain Scale: Faces POSS *See Group Information*: 1-Acceptable,Awake and alert  Pain Score: 0-No pain   SpO2: SpO2: 93 % O2 Device:SpO2: 93 % O2 Flow Rate: .O2 Flow Rate (L/min): 2 L/min  IO: Intake/output summary:  Intake/Output Summary (Last 24 hours) at 09/22/2021 1327 Last data filed at 09/22/2021 1300 Gross per 24 hour  Intake 240 ml  Output 850 ml  Net -610 ml    LBM: Last BM Date : 09/21/21 Baseline Weight: Weight: 70 kg Most recent weight: Weight: 70 kg     Palliative Assessment/Data: 20-30%     Time In: 1200  Time Total: 75 min  Greater than 50%  of this time was spent counseling and coordinating care related to the above assessment and plan.  Signed by: Vinie Sill, NP Palliative Medicine Team Pager # [phone] (M-F 8a-5p) Team Phone # [phone] (Nights/Weekends)

## 2021-09-22 NOTE — Progress Notes (Signed)
, PROGRESS NOTE    Paula Andrews  OVA:[id number] DOB: 1946-01-25 DOA: 09/18/2021 PCP: Lajean Manes, MD    Brief Narrative:  76 year old with history of diabetes, dementia, hypertension, hypothyroidism, GERD hyperlipidemia brought to the emergency room with shortness of breath, hypoxemic 88% on room air.  In the emergency room hyperkalemic, mild leukocytosis, UA with WBC 11-20.  Chest x-ray with interval development of diffuse interstitial infiltrate more within right lower lobe.  Admitted with right lower lobe pneumonia and hyperkalemia.   Assessment & Plan:   Right lower lobe pneumonia, acute hypoxemic respiratory failure, suspected aspiration pneumonia: Currently hemodynamically stable.  Treated with Rocephin and azithromycin.  Unable to participate on chest physiotherapy given advanced dementia. Clinically improving, unable to secure IV line as she keeps pulling off on it.  Changed to Augmentin for total 7 days.  Seen by speech therapy, aspiration precautions but normal swallowing. Bronchodilators as needed.  Essential hypertension: Blood pressure stable on lisinopril.  Acute metabolic encephalopathy in the setting of dementia, dehydration deconditioning: Patient with advanced dementia at baseline.  Now with worsening mental status, poor recovery, poor oral intake. Remains on Namenda, venlafaxine and BuSpar and Zyprexa.  Melatonin on hold. Given her advancing dementia, she will benefit with palliative care discussion. Patient without good oral intake.  Refuses food and medications.  Hypothyroidism: Continue Synthroid.  Hypokalemia: Resolved.  Lisinopril resumed.  Goal of care discussion: Met with patient's daughter and husband at the bedside.  We discussed about patient's intermittent refusal to medications, more deconditioning.  Gradually advancing dementia symptoms. Apparently, patient ate well with her daughter when she arrived.  Patient did not participate in any  conversation.  She was sleeping.  Even when she was awake she was not interactive. CODE STATUS discussion, we all agree that she should not undergo CPR, CODE STATUS changed to DNR. Family agreed to meet with palliative care team, if unable to meet in the hospital, will do outpatient referral.  With advancing dementia she may benefit with palliative or hospice follow-up.    DVT prophylaxis: enoxaparin (LOVENOX) injection 40 mg Start: 09/18/21 2200   Code Status: DNR/DNI Family Communication: Daughter and husband at the bedside. Disposition Plan: Status is: Inpatient Remains inpatient appropriate because: Persistent confusion, unsafe discharge planning.     Consultants:  Palliative care, consult called  Procedures:  None  Antimicrobials:  Rocephin azithromycin 6/18--6/21 Augmentin 6/21---   Subjective:  Seen and examined during morning rounds.  She was mostly sleepy, minimally interactive and not eating well. Came back to talk to the family at the bedside, patient was still sleepy.  Family tells me that she was walking herself a week ago at her assisted living facility. Patient is on 1 L of oxygen now.  She does have weak cough.  Unable to expectorate phlegm.  Objective: Vitals:   09/21/21 1413 09/21/21 2007 09/22/21 0500 09/22/21 0945  BP: 140/74 (!) 141/78 (!) 150/78 (!) 130/58  Pulse: 96 (!) 107 87   Resp: _0 Temp: 98.2 F (36.8 C) 99.5 F (37.5 C) 98.5 F (36.9 C)   TempSrc: Oral Oral Oral   SpO2: 91% 92% 93%   Weight:        Intake/Output Summary (Last 24 hours) at 09/22/2021 1111 Last data filed at 09/22/2021 0500 Gross per 24 hour  Intake --  Output 850 ml  Net -850 ml   Filed Weights   09/18/21 1936  Weight: 70 kg    Examination:  General: Alert and  awake but not oriented.  Ill looking.  Frail and debilitated. Cardiovascular: S1-S2 normal.  Regular rate rhythm. Respiratory: Mostly conducted upper airway sounds. Gastrointestinal: Soft.   Nontender.  Bowel sound present. Ext: No deformities.  No edema or cyanosis.      Data Reviewed: I have personally reviewed following labs and imaging studies  CBC: Recent Labs  Lab 09/18/21 1647 09/18/21 2024 09/19/21 0654  WBC 11.2* 10.4 10.4  NEUTROABS 8.2*  --   --   HGB 10.6* 10.4* 10.5*  HCT 34.0* 33.1* 32.9*  MCV 93.4 94.3 92.2  PLT 268 243 956   Basic Metabolic Panel: Recent Labs  Lab 09/18/21 1647 09/18/21 2024 09/19/21 0654  NA 136  --  141  K 6.6*  --  3.6  CL 102  --  102  CO2 27  --  29  GLUCOSE 121*  --  128*  BUN 28*  --  17  CREATININE 1.09* 0.89 0.81  CALCIUM 8.8*  --  8.4*   GFR: CrCl cannot be calculated (Unknown ideal weight.). Liver Function Tests: Recent Labs  Lab 09/18/21 1647  AST 48*  ALT 31  ALKPHOS 86  BILITOT 1.4*  PROT 7.0  ALBUMIN 3.4*   No results for input(s): "LIPASE", "AMYLASE" in the last 168 hours. No results for input(s): "AMMONIA" in the last 168 hours. Coagulation Profile: No results for input(s): "INR", "PROTIME" in the last 168 hours. Cardiac Enzymes: No results for input(s): "CKTOTAL", "CKMB", "CKMBINDEX", "TROPONINI" in the last 168 hours. BNP (last 3 results) No results for input(s): "PROBNP" in the last 8760 hours. HbA1C: No results for input(s): "HGBA1C" in the last 72 hours.  CBG: Recent Labs  Lab 09/21/21 0805 09/21/21 1153 09/21/21 1756 09/21/21 2122 09/22/21 0733  GLUCAP 122* 112* 105* 135* 125*   Lipid Profile: No results for input(s): "CHOL", "HDL", "LDLCALC", "TRIG", "CHOLHDL", "LDLDIRECT" in the last 72 hours. Thyroid Function Tests: No results for input(s): "TSH", "T4TOTAL", "FREET4", "T3FREE", "THYROIDAB" in the last 72 hours. Anemia Panel: No results for input(s): "VITAMINB12", "FOLATE", "FERRITIN", "TIBC", "IRON", "RETICCTPCT" in the last 72 hours. Sepsis Labs: Recent Labs  Lab 09/18/21 1647 09/18/21 2024  LATICACIDVEN 1.2 1.3    Recent Results (from the past 240 hour(s))   MRSA Next Gen by PCR, Nasal     Status: None   Collection Time: 09/19/21  1:49 AM   Specimen: Nasal Mucosa; Nasal Swab  Result Value Ref Range Status   MRSA by PCR Next Gen NOT DETECTED NOT DETECTED Final    Comment: (NOTE) The GeneXpert MRSA Assay (FDA approved for NASAL specimens only), is one component of a comprehensive MRSA colonization surveillance program. It is not intended to diagnose MRSA infection nor to guide or monitor treatment for MRSA infections. Test performance is not FDA approved in patients less than 14 years old. Performed at [facility], [place] [address] [place], [place] [address]          Radiology Studies: No results found.      Scheduled Meds:  amoxicillin-clavulanate  1 tablet Oral Q12H   azithromycin  500 mg Oral q1800   busPIRone  5 mg Oral BID WC   enoxaparin (LOVENOX) injection  40 mg Subcutaneous Q24H   famotidine  20 mg Oral q morning   insulin aspart  0-15 Units Subcutaneous TID WC   insulin aspart  0-5 Units Subcutaneous QHS   levothyroxine  50 mcg Oral Q0600   lisinopril  5 mg Oral q morning  memantine  21 mg Oral q morning   OLANZapine  2.5 mg Oral Q1400   pneumococcal 20-valent conjugate vaccine  0.5 mL Intramuscular Tomorrow-1000   venlafaxine XR  37.5 mg Oral q morning   Continuous Infusions:   LOS: 4 days    Time spent: 35 minutes    Barb Merino, MD [facility] Pager [phone]

## 2021-09-22 NOTE — Plan of Care (Signed)
Pt very drowsy.  Refuses oral meds intermittently.  No c/o at this time.  Problem: Education: Goal: Ability to describe self-care measures that may prevent or decrease complications (Diabetes Survival Skills Education) will improve Outcome: Progressing Goal: Individualized Educational Video(s) Outcome: Progressing   Problem: Coping: Goal: Ability to adjust to condition or change in health will improve Outcome: Progressing   Problem: Fluid Volume: Goal: Ability to maintain a balanced intake and output will improve Outcome: Progressing   Problem: Health Behavior/Discharge Planning: Goal: Ability to identify and utilize available resources and services will improve Outcome: Progressing Goal: Ability to manage health-related needs will improve Outcome: Progressing   Problem: Metabolic: Goal: Ability to maintain appropriate glucose levels will improve Outcome: Progressing   Problem: Skin Integrity: Goal: Risk for impaired skin integrity will decrease Outcome: Progressing   Problem: Tissue Perfusion: Goal: Adequacy of tissue perfusion will improve Outcome: Progressing   Problem: Education: Goal: Knowledge of General Education information will improve Description: Including pain rating scale, medication(s)/side effects and non-pharmacologic comfort measures Outcome: Progressing   Problem: Health Behavior/Discharge Planning: Goal: Ability to manage health-related needs will improve Outcome: Progressing   Problem: Clinical Measurements: Goal: Ability to maintain clinical measurements within normal limits will improve Outcome: Progressing Goal: Will remain free from infection Outcome: Progressing Goal: Diagnostic test results will improve Outcome: Progressing Goal: Respiratory complications will improve Outcome: Progressing Goal: Cardiovascular complication will be avoided Outcome: Progressing   Problem: Coping: Goal: Level of anxiety will decrease Outcome:  Progressing   Problem: Elimination: Goal: Will not experience complications related to bowel motility Outcome: Progressing Goal: Will not experience complications related to urinary retention Outcome: Progressing   Problem: Pain Managment: Goal: General experience of comfort will improve Outcome: Progressing   Problem: Safety: Goal: Ability to remain free from injury will improve Outcome: Progressing   Problem: Skin Integrity: Goal: Risk for impaired skin integrity will decrease Outcome: Progressing

## 2021-09-23 DIAGNOSIS — J9601 Acute respiratory failure with hypoxia: Secondary | ICD-10-CM | POA: Diagnosis not present

## 2021-09-23 LAB — GLUCOSE, CAPILLARY
Glucose-Capillary: 120 mg/dL — ABNORMAL HIGH (ref 70–99)
Glucose-Capillary: 115 mg/dL — ABNORMAL HIGH (ref 70–99)
Glucose-Capillary: 115 mg/dL — ABNORMAL HIGH (ref 70–99)
Glucose-Capillary: 99 mg/dL (ref 70–99)

## 2021-09-23 NOTE — Progress Notes (Signed)
, PROGRESS NOTE    Paula Andrews  QMV:[id number] DOB: 1945/08/09 DOA: 09/18/2021 PCP: Merlene Laughter, MD    Brief Narrative:  75 year old with history of diabetes, dementia, hypertension, hypothyroidism, GERD, hyperlipidemia brought to the emergency room with shortness of breath, hypoxemic 88% on room air.  In the emergency room hyperkalemic, mild leukocytosis, UA with WBC 11-20.  Chest x-ray with interval development of diffuse interstitial infiltrate more within right lower lobe.  Admitted with right lower lobe pneumonia and hyperkalemia.  Assessment & Plan:   Right lower lobe pneumonia, acute hypoxemic respiratory failure, suspected aspiration pneumonia: Currently hemodynamically stable.  Treated with Rocephin and azithromycin.  Not able to participate much in respiratory therapy. Unable to participate on chest physiotherapy. Clinically improving, patient is unable to keep her IV line.  Changed to Augmentin for total 7 days.  Seen by speech therapy, aspiration precautions but normal swallowing. Bronchodilators as needed.  Essential hypertension: Blood pressure stable on lisinopril.  Acute metabolic encephalopathy in the setting of dementia, dehydration deconditioning: Remains on Namenda, venlafaxine and BuSpar and Zyprexa and melatonin. Palliative following. Currently DNR. She is deconditioned and less functional than her baseline.  Will benefit with short-term rehab before going back to assisted living facility.  Will recommend ongoing follow-up with palliative care.  Hypothyroidism: Continue Synthroid.  Hypokalemia: Resolved.  Lisinopril resumed.  Goal of care discussion: Currently DNR. Husband is Education officer, environmental and healthcare power of attorney. They are open to pursue short-term rehab and transition to assisted living facility.  Will recommend outpatient palliative care follow-up.    DVT prophylaxis: enoxaparin (LOVENOX) injection 40 mg Start: 09/18/21 2200   Code  Status: DNR/DNI Family Communication: None today. Disposition Plan: Status is: Inpatient Remains inpatient appropriate because: Stable to transition to SNF level of care.     Consultants:  Palliative care,  Procedures:  None  Antimicrobials:  Rocephin azithromycin 6/18--6/21 Augmentin 6/21---   Subjective:  Seen and examined.  No overnight events.  Pleasant.  Awake today but unable to keep up with conversation.  Objective: Vitals:   09/22/21 0945 09/22/21 1338 09/22/21 1935 09/23/21 0517  BP: (!) 130/58 135/74 134/72 129/66  Pulse:  (!) 103 96 77  Resp:  16 14 18   Temp:  98 F (36.7 C) 99 F (37.2 C) 97.7 F (36.5 C)  TempSrc:  Oral Oral Oral  SpO2:  97% 98% 99%  Weight:        Intake/Output Summary (Last 24 hours) at 09/23/2021 1136 Last data filed at 09/23/2021 0500 Gross per 24 hour  Intake 120 ml  Output 500 ml  Net -380 ml   Filed Weights   09/18/21 1936  Weight: 70 kg    Examination:  General: Alert and awake but not oriented.  Pleasant and confused.  Frail and debilitated. Cardiovascular: S1-S2 normal.  Regular rate rhythm. Respiratory: Mostly conducted upper airway sounds. Gastrointestinal: Soft.  Nontender.  Bowel sound present. Ext: No deformities.  No edema or cyanosis.      Data Reviewed: I have personally reviewed following labs and imaging studies  CBC: Recent Labs  Lab 09/18/21 1647 09/18/21 2024 09/19/21 0654  WBC 11.2* 10.4 10.4  NEUTROABS 8.2*  --   --   HGB 10.6* 10.4* 10.5*  HCT 34.0* 33.1* 32.9*  MCV 93.4 94.3 92.2  PLT 268 243 307   Basic Metabolic Panel: Recent Labs  Lab 09/18/21 1647 09/18/21 2024 09/19/21 0654  NA 136  --  141  K 6.6*  --  3.6  CL 102  --  102  CO2 27  --  29  GLUCOSE 121*  --  128*  BUN 28*  --  17  CREATININE 1.09* 0.89 0.81  CALCIUM 8.8*  --  8.4*   GFR: CrCl cannot be calculated (Unknown ideal weight.). Liver Function Tests: Recent Labs  Lab 09/18/21 1647  AST 48*  ALT 31   ALKPHOS 86  BILITOT 1.4*  PROT 7.0  ALBUMIN 3.4*   No results for input(s): "LIPASE", "AMYLASE" in the last 168 hours. No results for input(s): "AMMONIA" in the last 168 hours. Coagulation Profile: No results for input(s): "INR", "PROTIME" in the last 168 hours. Cardiac Enzymes: No results for input(s): "CKTOTAL", "CKMB", "CKMBINDEX", "TROPONINI" in the last 168 hours. BNP (last 3 results) No results for input(s): "PROBNP" in the last 8760 hours. HbA1C: No results for input(s): "HGBA1C" in the last 72 hours.  CBG: Recent Labs  Lab 09/22/21 0733 09/22/21 1153 09/22/21 1642 09/22/21 2133 09/23/21 0728  GLUCAP 125* 182* 100* 110* 120*   Lipid Profile: No results for input(s): "CHOL", "HDL", "LDLCALC", "TRIG", "CHOLHDL", "LDLDIRECT" in the last 72 hours. Thyroid Function Tests: No results for input(s): "TSH", "T4TOTAL", "FREET4", "T3FREE", "THYROIDAB" in the last 72 hours. Anemia Panel: No results for input(s): "VITAMINB12", "FOLATE", "FERRITIN", "TIBC", "IRON", "RETICCTPCT" in the last 72 hours. Sepsis Labs: Recent Labs  Lab 09/18/21 1647 09/18/21 2024  LATICACIDVEN 1.2 1.3    Recent Results (from the past 240 hour(s))  MRSA Next Gen by PCR, Nasal     Status: None   Collection Time: 09/19/21  1:49 AM   Specimen: Nasal Mucosa; Nasal Swab  Result Value Ref Range Status   MRSA by PCR Next Gen NOT DETECTED NOT DETECTED Final    Comment: (NOTE) The GeneXpert MRSA Assay (FDA approved for NASAL specimens only), is one component of a comprehensive MRSA colonization surveillance program. It is not intended to diagnose MRSA infection nor to guide or monitor treatment for MRSA infections. Test performance is not FDA approved in patients less than 25 years old. Performed at [facility], [address], [place], [place] [address]          Radiology Studies: No results found.      Scheduled Meds:  amoxicillin-clavulanate  1 tablet Oral Q12H    azithromycin  500 mg Oral q1800   busPIRone  5 mg Oral BID WC   enoxaparin (LOVENOX) injection  40 mg Subcutaneous Q24H   famotidine  20 mg Oral q morning   insulin aspart  0-15 Units Subcutaneous TID WC   insulin aspart  0-5 Units Subcutaneous QHS   levothyroxine  50 mcg Oral Q0600   lisinopril  5 mg Oral q morning   memantine  21 mg Oral q morning   OLANZapine  2.5 mg Oral Q1400   pneumococcal 20-valent conjugate vaccine  0.5 mL Intramuscular Tomorrow-1000   venlafaxine XR  37.5 mg Oral q morning   Continuous Infusions:   LOS: 5 days    Time spent: 35 minutes    Dorcas Carrow, MD [facility] Pager [phone]

## 2021-09-24 DIAGNOSIS — J9601 Acute respiratory failure with hypoxia: Secondary | ICD-10-CM | POA: Diagnosis not present

## 2021-09-24 LAB — GLUCOSE, CAPILLARY
Glucose-Capillary: 125 mg/dL — ABNORMAL HIGH (ref 70–99)
Glucose-Capillary: 92 mg/dL (ref 70–99)
Glucose-Capillary: 121 mg/dL — ABNORMAL HIGH (ref 70–99)
Glucose-Capillary: 134 mg/dL — ABNORMAL HIGH (ref 70–99)

## 2021-09-25 DIAGNOSIS — J9601 Acute respiratory failure with hypoxia: Secondary | ICD-10-CM | POA: Diagnosis not present

## 2021-09-25 LAB — GLUCOSE, CAPILLARY
Glucose-Capillary: 116 mg/dL — ABNORMAL HIGH (ref 70–99)
Glucose-Capillary: 165 mg/dL — ABNORMAL HIGH (ref 70–99)
Glucose-Capillary: 115 mg/dL — ABNORMAL HIGH (ref 70–99)
Glucose-Capillary: 120 mg/dL — ABNORMAL HIGH (ref 70–99)

## 2021-09-25 LAB — BASIC METABOLIC PANEL
Anion gap: 11 (ref 5–15)
BUN: 10 mg/dL (ref 8–23)
CO2: 27 mmol/L (ref 22–32)
Calcium: 9.2 mg/dL (ref 8.9–10.3)
Chloride: 99 mmol/L (ref 98–111)
Creatinine, Ser: 0.67 mg/dL (ref 0.44–1.00)
GFR, Estimated: >60 mL/min (ref >60)
Glucose, Bld: 126 mg/dL — ABNORMAL HIGH (ref 70–99)
Potassium: 3.8 mmol/L (ref 3.5–5.1)
Sodium: 137 mmol/L (ref 135–145)

## 2021-09-26 DIAGNOSIS — J9601 Acute respiratory failure with hypoxia: Secondary | ICD-10-CM | POA: Diagnosis not present

## 2021-09-26 LAB — GLUCOSE, CAPILLARY
Glucose-Capillary: 119 mg/dL — ABNORMAL HIGH (ref 70–99)
Glucose-Capillary: 169 mg/dL — ABNORMAL HIGH (ref 70–99)

## 2021-09-26 NOTE — Progress Notes (Signed)
[facility] [address] [facility] Hospital Liaison note:  Notified via Epic workque from Dr. Dorcas Carrow of request for [facility] Palliative Care services. Will continue to follow for disposition.  Please call with any outpatient palliative questions or concerns.  Thank you for the opportunity to participate in this patient's care.  Thank you, Abran Cantor, LPN [facility] Liaison [phone]

## 2021-09-26 NOTE — TOC Transition Note (Signed)
Transition of Care [facility]) - CM/SW Discharge Note   Patient Details  Name: Paula Andrews MRN: [id number] Date of Birth: 08-09-1945  [facility]) CM/SW Contact:  Kennieth Plotts, Meriam Sprague, RN Phone Number: 09/26/2021, 12:42 PM   Clinical Narrative:     Per MD pt is mainly back to her baseline functionally and can go back to the memory care today. Spoke with liaison at [facility] who confirmed pt could come back today. Husband was called to inform of transport. DC summary and FL2 faxed to [facility] [phone]. Yellow DNR on the chart for transport and PTAR called. RN to call report to [phone].  Final next level of care: Memory Care Barriers to Discharge: Continued Medical Work up   Patient Goals and CMS Choice Patient states their goals for this hospitalization and ongoing recovery are:: Dementia       Discharge Plan and Services   Discharge Planning Services: CM Consult                Readmission Risk Interventions    09/20/2021    3:04 PM  Readmission Risk Prevention Plan  Transportation Screening Complete  PCP or Specialist Appt within 5-7 Days Complete  Home Care Screening Complete  Medication Review (RN CM) Complete

## 2021-09-26 NOTE — Progress Notes (Signed)
This nurse called [facility] twice to give report due to patient returning to facility, no answer.

## 2021-09-28 DIAGNOSIS — F01511 Vascular dementia, unspecified severity, with agitation: Secondary | ICD-10-CM | POA: Diagnosis not present

## 2021-09-28 DIAGNOSIS — J168 Pneumonia due to other specified infectious organisms: Secondary | ICD-10-CM | POA: Diagnosis not present

## 2021-09-28 DIAGNOSIS — J449 Chronic obstructive pulmonary disease, unspecified: Secondary | ICD-10-CM | POA: Diagnosis not present

## 2021-09-28 DIAGNOSIS — R269 Unspecified abnormalities of gait and mobility: Secondary | ICD-10-CM | POA: Diagnosis not present

## 2021-10-06 DIAGNOSIS — M6250 Muscle wasting and atrophy, not elsewhere classified, unspecified site: Secondary | ICD-10-CM | POA: Diagnosis not present

## 2021-10-06 DIAGNOSIS — I69815 Cognitive social or emotional deficit following other cerebrovascular disease: Secondary | ICD-10-CM | POA: Diagnosis not present

## 2021-10-06 DIAGNOSIS — Z9181 History of falling: Secondary | ICD-10-CM | POA: Diagnosis not present

## 2021-10-06 DIAGNOSIS — R251 Tremor, unspecified: Secondary | ICD-10-CM | POA: Diagnosis not present

## 2021-10-06 DIAGNOSIS — F0390 Unspecified dementia without behavioral disturbance: Secondary | ICD-10-CM | POA: Diagnosis not present

## 2021-10-06 DIAGNOSIS — E1165 Type 2 diabetes mellitus with hyperglycemia: Secondary | ICD-10-CM | POA: Diagnosis not present

## 2021-10-06 DIAGNOSIS — R278 Other lack of coordination: Secondary | ICD-10-CM | POA: Diagnosis not present

## 2021-10-06 DIAGNOSIS — F0150 Vascular dementia without behavioral disturbance: Secondary | ICD-10-CM | POA: Diagnosis not present

## 2021-10-06 DIAGNOSIS — R262 Difficulty in walking, not elsewhere classified: Secondary | ICD-10-CM | POA: Diagnosis not present

## 2021-10-06 DIAGNOSIS — D509 Iron deficiency anemia, unspecified: Secondary | ICD-10-CM | POA: Diagnosis not present

## 2021-10-06 DIAGNOSIS — I10 Essential (primary) hypertension: Secondary | ICD-10-CM | POA: Diagnosis not present

## 2021-10-06 DIAGNOSIS — M6281 Muscle weakness (generalized): Secondary | ICD-10-CM | POA: Diagnosis not present

## 2021-10-10 DIAGNOSIS — I10 Essential (primary) hypertension: Secondary | ICD-10-CM | POA: Diagnosis not present

## 2021-10-10 DIAGNOSIS — F0390 Unspecified dementia without behavioral disturbance: Secondary | ICD-10-CM | POA: Diagnosis not present

## 2021-10-10 DIAGNOSIS — R262 Difficulty in walking, not elsewhere classified: Secondary | ICD-10-CM | POA: Diagnosis not present

## 2021-10-10 DIAGNOSIS — R278 Other lack of coordination: Secondary | ICD-10-CM | POA: Diagnosis not present

## 2021-10-10 DIAGNOSIS — M6250 Muscle wasting and atrophy, not elsewhere classified, unspecified site: Secondary | ICD-10-CM | POA: Diagnosis not present

## 2021-10-12 DIAGNOSIS — F321 Major depressive disorder, single episode, moderate: Secondary | ICD-10-CM | POA: Diagnosis not present

## 2021-10-12 DIAGNOSIS — F251 Schizoaffective disorder, depressive type: Secondary | ICD-10-CM | POA: Diagnosis not present

## 2021-10-12 DIAGNOSIS — M6250 Muscle wasting and atrophy, not elsewhere classified, unspecified site: Secondary | ICD-10-CM | POA: Diagnosis not present

## 2021-10-12 DIAGNOSIS — I10 Essential (primary) hypertension: Secondary | ICD-10-CM | POA: Diagnosis not present

## 2021-10-12 DIAGNOSIS — F0390 Unspecified dementia without behavioral disturbance: Secondary | ICD-10-CM | POA: Diagnosis not present

## 2021-10-12 DIAGNOSIS — R278 Other lack of coordination: Secondary | ICD-10-CM | POA: Diagnosis not present

## 2021-10-12 DIAGNOSIS — F411 Generalized anxiety disorder: Secondary | ICD-10-CM | POA: Diagnosis not present

## 2021-10-12 DIAGNOSIS — F01C2 Vascular dementia, severe, with psychotic disturbance: Secondary | ICD-10-CM | POA: Diagnosis not present

## 2021-10-12 DIAGNOSIS — R262 Difficulty in walking, not elsewhere classified: Secondary | ICD-10-CM | POA: Diagnosis not present

## 2021-10-13 DIAGNOSIS — I69815 Cognitive social or emotional deficit following other cerebrovascular disease: Secondary | ICD-10-CM | POA: Diagnosis not present

## 2021-10-13 DIAGNOSIS — M6281 Muscle weakness (generalized): Secondary | ICD-10-CM | POA: Diagnosis not present

## 2021-10-13 DIAGNOSIS — I10 Essential (primary) hypertension: Secondary | ICD-10-CM | POA: Diagnosis not present

## 2021-10-13 DIAGNOSIS — Z9181 History of falling: Secondary | ICD-10-CM | POA: Diagnosis not present

## 2021-10-13 DIAGNOSIS — E1165 Type 2 diabetes mellitus with hyperglycemia: Secondary | ICD-10-CM | POA: Diagnosis not present

## 2021-10-13 DIAGNOSIS — D509 Iron deficiency anemia, unspecified: Secondary | ICD-10-CM | POA: Diagnosis not present

## 2021-10-13 DIAGNOSIS — F0150 Vascular dementia without behavioral disturbance: Secondary | ICD-10-CM | POA: Diagnosis not present

## 2021-10-13 DIAGNOSIS — F0390 Unspecified dementia without behavioral disturbance: Secondary | ICD-10-CM | POA: Diagnosis not present

## 2021-10-13 DIAGNOSIS — R278 Other lack of coordination: Secondary | ICD-10-CM | POA: Diagnosis not present

## 2021-10-13 DIAGNOSIS — R251 Tremor, unspecified: Secondary | ICD-10-CM | POA: Diagnosis not present

## 2021-10-13 DIAGNOSIS — M6250 Muscle wasting and atrophy, not elsewhere classified, unspecified site: Secondary | ICD-10-CM | POA: Diagnosis not present

## 2021-10-13 DIAGNOSIS — R262 Difficulty in walking, not elsewhere classified: Secondary | ICD-10-CM | POA: Diagnosis not present

## 2021-10-14 ENCOUNTER — Encounter: Payer: Self-pay | Admitting: Nurse Practitioner

## 2021-10-14 ENCOUNTER — Non-Acute Institutional Stay: Payer: Medicare Other | Admitting: Nurse Practitioner

## 2021-10-14 DIAGNOSIS — R5381 Other malaise: Secondary | ICD-10-CM

## 2021-10-14 DIAGNOSIS — Z515 Encounter for palliative care: Secondary | ICD-10-CM

## 2021-10-14 DIAGNOSIS — F02818 Dementia in other diseases classified elsewhere, unspecified severity, with other behavioral disturbance: Secondary | ICD-10-CM

## 2021-10-14 NOTE — Progress Notes (Signed)
[facility] Pall[facility] Consult Note Telephone: [phone]  Fax: [phone]   Date of encounter: 10/14/21 5:32 PM PATIENT NAME: Paula Andrews [address] [place] [address]   [phone] (home)  DOB: 15-Feb-1946 MRN: [id number] PRIMARY CARE PROVIDER:   [facility] Lajean Manes, MD,  [place] [place] [address] [phone]  RESPONSIBLE PARTY:    Contact Information     Name Relation Home Work [place] Spouse [phone]  [phone]   Theresa Duty   [phone]      I met face to face with patient in facility. Palliative Care was asked to follow this patient by consultation request of  Lajean Manes, MD [facility] to address advance care planning and complex medical decision making. This is the initial visit.                               ASSESSMENT AND PLAN / RECOMMENDATIONS:  1. Advance Care Planning;  DNR with golden at facility  2. Goals of Care: Goals include to maximize quality of life and symptom management. Our advance care planning conversation included a discussion about:    The value and importance of advance care planning  Exploration of personal, cultural or spiritual beliefs that might influence medical decisions  Exploration of goals of care in the event of a sudden injury or illness  Identification and preparation of a healthcare agent  Review and updating or creation of an advance directive document.  3. Debility secondary to dementia, continue to monitor, further discuss with son, goc. Continue fall precautions. Monitor for aspiration.   4. Palliative care encounter; Palliative care encounter; Palliative medicine team will continue to support patient, patient's family, and medical team. Visit consisted of counseling and education dealing with the complex and emotionally intense issues of symptom management and palliative care in the setting of serious and  potentially life-threatening illness  Follow up Palliative Care Visit: Palliative care will continue to follow for complex medical decision making, advance care planning, and clarification of goals. Return 4 weeks or prn.  I spent 62 minutes providing this consultation. More than 50% of the time in this consultation was spent in counseling and care coordination. PPS: 40% Chief Complaint: Initial palliative consult for complex medical decision making, address goals, manage ongoing symptoms  HISTORY OF PRESENT ILLNESS:  Paula Andrews is a 76 y.o. year old female  with multiple medical problems including dementia, HTN, DM, Hypothyroidism, HLD. Hospitalized 09/18/2021 to 09/26/2021 for right lower lobe pneumonia, acute hypoxemic respiratory failure suspected aspiration pneumonia, acute metabolic encephalopathy in the setting of dementia, dehydration, deconditioning. She was stabilized and d/c back to [facility], locked memory care unit where she is a long term resident. Ms. Leiner requires assistance for transfers, mobility, adl's, bathing, dressing, toileting per staff. Ms. Meskill does feed herself with fairly good appetite reported by staff. Ms .Surges is a DNR. At present Ms. Arras is sitting at the table with other residents waiting for lunch. Ms. Ryce does make eye contact, answers brief simple questions, answers not appropriate. Limited discussion with cognitive impairment. Ms. Jurek was cooperative with assessment. Support provided. Will contact son for further discussions of goc. Updated staff.   History obtained from review of EMR, discussion with primary team, and interview with family, facility staff/caregiver and/or Ms. Bianca.  I reviewed available labs, medications, imaging, studies and related documents from  the EMR.  Records reviewed and summarized above.   ROS 10 point system reviewed all negative except HPI  Physical Exam: Constitutional: NAD General: obese, pleasantly confused  female EYES: lids intact ENMT: oral mucous membranes moist CV: S1S2, RRR Pulmonary: LCTA, no increased work of breathing, no cough,  MSK: w/c Skin: warm and dry Neuro:  no generalized weakness,  + cognitive impairment Psych: non-anxious affect, A and Oriented to person  CURRENT PROBLEM LIST:  Patient Active Problem List   Diagnosis Date Noted   Leucocytosis 09/18/2021   Hyperkalemia 09/18/2021   Acute hypoxemic respiratory failure ([place]) 09/18/2021   HCAP (healthcare-associated pneumonia)    Enterobacter sepsis ([place]) 03/24/2018   Sepsis due to undetermined organism ([place]) 03/23/2018   Lobar pneumonia ([place]) 03/23/2018   Bacteremia due to Gram-negative bacteria 03/23/2018   Gram negative sepsis ([place]) 03/23/2018   Pulmonary embolus (HCC)    Acute respiratory failure with hypoxia ([place]) 03/13/2018   Acute encephalopathy 03/13/2018   Fall 03/13/2018   Sinus bradycardia 03/13/2018   Dementia ([place]) 03/13/2018   Hypothyroidism 03/13/2018   Mood disorder ([place]) 03/13/2018   COPD with acute exacerbation ([place]) 08/07/2017   Adjustment 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 05/31/2017   Chronic respiratory failure with hypoxia and hypercapnia (HCC)    Acute respiratory failure with hypoxia and hypercapnia ([place]) 05/30/2017   Acute lower UTI 05/30/2017   HTN (hypertension) 05/30/2017   Diabetes mellitus type II, controlled ([place]) 05/30/2017   HLD (hyperlipidemia) 05/30/2017   UTI (urinary tract infection) 05/30/2017   Hypoxia 05/29/2017   Hyponatremia 05/16/2017   Fracture of radial neck, left, closed 01/29/2012   CLOSED FRACTURE UNSPEC PART UPPER END HUMERUS 04/13/2010   PAST MEDICAL HISTORY:  Active Ambulatory Problems    Diagnosis Date Noted   CLOSED FRACTURE UNSPEC PART UPPER END HUMERUS 04/13/2010   Fracture of radial neck, left, closed 01/29/2012   Hyponatremia 05/16/2017   Hypoxia 05/29/2017   Acute respiratory failure with hypoxia and hypercapnia ([place])  05/30/2017   Acute lower UTI 05/30/2017   HTN (hypertension) 05/30/2017   Diabetes mellitus type II, controlled ([place]) 05/30/2017   HLD (hyperlipidemia) 05/30/2017   UTI (urinary tract infection) 05/30/2017   Adjustment 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 05/31/2017   Chronic respiratory failure with hypoxia and hypercapnia ([place])    COPD with acute exacerbation ([place]) 08/07/2017   Acute respiratory failure with hypoxia ([place]) 03/13/2018   Acute encephalopathy 03/13/2018   Fall 03/13/2018   Sinus bradycardia 03/13/2018   Dementia ([place]) 03/13/2018   Hypothyroidism 03/13/2018   Mood disorder ([place]) 03/13/2018   Pulmonary embolus (HCC)    Sepsis due to undetermined organism ([place]) 03/23/2018   Lobar pneumonia ([place]) 03/23/2018   Bacteremia due to Gram-negative bacteria 03/23/2018   Gram negative sepsis ([place]) 03/23/2018   Enterobacter sepsis ([place]) 03/24/2018   HCAP (healthcare-associated pneumonia)    Leucocytosis 09/18/2021   Hyperkalemia 09/18/2021   Acute hypoxemic respiratory failure ([place]) 09/18/2021   Resolved Ambulatory Problems    Diagnosis Date Noted   No Resolved Ambulatory Problems   Past Medical History:  Diagnosis Date   Diabetes mellitus    Hypertension    Thyroid disease    SOCIAL HX:  Social History   Tobacco Use   Smoking status: Former    Packs/day: 1.00    Years: 41.00    Total pack years: 41.00    Types: Cigarettes    Quit date: 04/03/2001    Years since quitting: 20.5  Smokeless tobacco: Never  Substance Use Topics   Alcohol use: No   FAMILY HX:  Family History  Problem Relation Age of Onset   Diabetes Other       ALLERGIES: No Known Allergies   PERTINENT MEDICATIONS:  Outpatient Encounter Medications as of 10/14/2021  Medication Sig   acetaminophen (TYLENOL) 325 MG tablet Take 2 tablets (650 mg total) by mouth 2 (two) times daily as needed for mild pain or moderate pain.   alum & mag hydroxide-simeth (MI-ACID) 200-200-20  MG/5ML suspension Take 30 mLs by mouth every 6 (six) hours as needed for indigestion or heartburn.   busPIRone (BUSPAR) 5 MG tablet Take 5 mg by mouth 2 (two) times daily. 8am, 5pm   famotidine (PEPCID) 20 MG tablet Take 20 mg by mouth every morning.   levothyroxine (SYNTHROID) 50 MCG tablet Take 50 mcg by mouth daily at 6 (six) AM.   lisinopril (ZESTRIL) 5 MG tablet Take 1 tablet (5 mg total) by mouth daily. (Patient taking differently: Take 5 mg by mouth every morning.)   loperamide (IMODIUM) 2 MG capsule Take 2 mg by mouth as needed for diarrhea or loose stools (max 8 doses in 24 hours).   magnesium hydroxide (MILK OF MAGNESIA) 400 MG/5ML suspension Take 30 mLs by mouth at bedtime as needed (constipation).   melatonin 1 MG TABS tablet Take 1 mg by mouth at bedtime.   memantine (NAMENDA XR) 21 MG CP24 24 hr capsule Take 1 capsule (21 mg total) by mouth daily. (Patient taking differently: Take 21 mg by mouth every morning.)   metFORMIN (GLUCOPHAGE) 500 MG tablet Take 1 tablet (500 mg total) by mouth daily as needed (for high blood sugars). (Patient taking differently: Take 500 mg by mouth 2 (two) times daily with a meal.)   OLANZapine (ZYPREXA) 2.5 MG tablet Take 2.5 mg by mouth daily at 2 PM.   venlafaxine XR (EFFEXOR-XR) 37.5 MG 24 hr capsule Take 37.5 mg by mouth every morning.   Vitamin D, Cholecalciferol, 10 MCG (400 UNIT) TABS Take 800 Units by mouth every morning.   No facility-administered encounter medications on file as of 10/14/2021.   Thank you for the opportunity to participate in the care of Ms. Chait.  The palliative care team will continue to follow. Please call our office at [phone] if we can be of additional assistance.   Juanna Pudlo Z Marsean Elkhatib, NP ,

## 2021-10-17 DIAGNOSIS — F0150 Vascular dementia without behavioral disturbance: Secondary | ICD-10-CM | POA: Diagnosis not present

## 2021-10-17 DIAGNOSIS — E1165 Type 2 diabetes mellitus with hyperglycemia: Secondary | ICD-10-CM | POA: Diagnosis not present

## 2021-10-17 DIAGNOSIS — Z9181 History of falling: Secondary | ICD-10-CM | POA: Diagnosis not present

## 2021-10-17 DIAGNOSIS — M6281 Muscle weakness (generalized): Secondary | ICD-10-CM | POA: Diagnosis not present

## 2021-10-17 DIAGNOSIS — D509 Iron deficiency anemia, unspecified: Secondary | ICD-10-CM | POA: Diagnosis not present

## 2021-10-17 DIAGNOSIS — I69815 Cognitive social or emotional deficit following other cerebrovascular disease: Secondary | ICD-10-CM | POA: Diagnosis not present

## 2021-10-17 DIAGNOSIS — R262 Difficulty in walking, not elsewhere classified: Secondary | ICD-10-CM | POA: Diagnosis not present

## 2021-10-17 DIAGNOSIS — R251 Tremor, unspecified: Secondary | ICD-10-CM | POA: Diagnosis not present

## 2021-10-17 DIAGNOSIS — R278 Other lack of coordination: Secondary | ICD-10-CM | POA: Diagnosis not present

## 2021-10-17 DIAGNOSIS — F0390 Unspecified dementia without behavioral disturbance: Secondary | ICD-10-CM | POA: Diagnosis not present

## 2021-10-17 DIAGNOSIS — I10 Essential (primary) hypertension: Secondary | ICD-10-CM | POA: Diagnosis not present

## 2021-10-17 DIAGNOSIS — M6250 Muscle wasting and atrophy, not elsewhere classified, unspecified site: Secondary | ICD-10-CM | POA: Diagnosis not present

## 2021-10-19 DIAGNOSIS — I69815 Cognitive social or emotional deficit following other cerebrovascular disease: Secondary | ICD-10-CM | POA: Diagnosis not present

## 2021-10-19 DIAGNOSIS — D509 Iron deficiency anemia, unspecified: Secondary | ICD-10-CM | POA: Diagnosis not present

## 2021-10-19 DIAGNOSIS — F0390 Unspecified dementia without behavioral disturbance: Secondary | ICD-10-CM | POA: Diagnosis not present

## 2021-10-19 DIAGNOSIS — M6281 Muscle weakness (generalized): Secondary | ICD-10-CM | POA: Diagnosis not present

## 2021-10-19 DIAGNOSIS — Z9181 History of falling: Secondary | ICD-10-CM | POA: Diagnosis not present

## 2021-10-19 DIAGNOSIS — E1165 Type 2 diabetes mellitus with hyperglycemia: Secondary | ICD-10-CM | POA: Diagnosis not present

## 2021-10-19 DIAGNOSIS — R251 Tremor, unspecified: Secondary | ICD-10-CM | POA: Diagnosis not present

## 2021-10-19 DIAGNOSIS — M6250 Muscle wasting and atrophy, not elsewhere classified, unspecified site: Secondary | ICD-10-CM | POA: Diagnosis not present

## 2021-10-19 DIAGNOSIS — R278 Other lack of coordination: Secondary | ICD-10-CM | POA: Diagnosis not present

## 2021-10-19 DIAGNOSIS — R262 Difficulty in walking, not elsewhere classified: Secondary | ICD-10-CM | POA: Diagnosis not present

## 2021-10-19 DIAGNOSIS — I10 Essential (primary) hypertension: Secondary | ICD-10-CM | POA: Diagnosis not present

## 2021-10-19 DIAGNOSIS — F0150 Vascular dementia without behavioral disturbance: Secondary | ICD-10-CM | POA: Diagnosis not present

## 2021-10-20 DIAGNOSIS — F0150 Vascular dementia without behavioral disturbance: Secondary | ICD-10-CM | POA: Diagnosis not present

## 2021-10-20 DIAGNOSIS — H2513 Age-related nuclear cataract, bilateral: Secondary | ICD-10-CM | POA: Diagnosis not present

## 2021-10-20 DIAGNOSIS — D509 Iron deficiency anemia, unspecified: Secondary | ICD-10-CM | POA: Diagnosis not present

## 2021-10-20 DIAGNOSIS — E1165 Type 2 diabetes mellitus with hyperglycemia: Secondary | ICD-10-CM | POA: Diagnosis not present

## 2021-10-20 DIAGNOSIS — R251 Tremor, unspecified: Secondary | ICD-10-CM | POA: Diagnosis not present

## 2021-10-20 DIAGNOSIS — H25013 Cortical age-related cataract, bilateral: Secondary | ICD-10-CM | POA: Diagnosis not present

## 2021-10-20 DIAGNOSIS — Z9181 History of falling: Secondary | ICD-10-CM | POA: Diagnosis not present

## 2021-10-20 DIAGNOSIS — I69815 Cognitive social or emotional deficit following other cerebrovascular disease: Secondary | ICD-10-CM | POA: Diagnosis not present

## 2021-10-20 DIAGNOSIS — M6281 Muscle weakness (generalized): Secondary | ICD-10-CM | POA: Diagnosis not present

## 2021-10-21 DIAGNOSIS — M6250 Muscle wasting and atrophy, not elsewhere classified, unspecified site: Secondary | ICD-10-CM | POA: Diagnosis not present

## 2021-10-21 DIAGNOSIS — I10 Essential (primary) hypertension: Secondary | ICD-10-CM | POA: Diagnosis not present

## 2021-10-21 DIAGNOSIS — R262 Difficulty in walking, not elsewhere classified: Secondary | ICD-10-CM | POA: Diagnosis not present

## 2021-10-21 DIAGNOSIS — R278 Other lack of coordination: Secondary | ICD-10-CM | POA: Diagnosis not present

## 2021-10-21 DIAGNOSIS — F0390 Unspecified dementia without behavioral disturbance: Secondary | ICD-10-CM | POA: Diagnosis not present

## 2021-10-24 DIAGNOSIS — R262 Difficulty in walking, not elsewhere classified: Secondary | ICD-10-CM | POA: Diagnosis not present

## 2021-10-24 DIAGNOSIS — R251 Tremor, unspecified: Secondary | ICD-10-CM | POA: Diagnosis not present

## 2021-10-24 DIAGNOSIS — I10 Essential (primary) hypertension: Secondary | ICD-10-CM | POA: Diagnosis not present

## 2021-10-24 DIAGNOSIS — I69815 Cognitive social or emotional deficit following other cerebrovascular disease: Secondary | ICD-10-CM | POA: Diagnosis not present

## 2021-10-24 DIAGNOSIS — Z9181 History of falling: Secondary | ICD-10-CM | POA: Diagnosis not present

## 2021-10-24 DIAGNOSIS — M6281 Muscle weakness (generalized): Secondary | ICD-10-CM | POA: Diagnosis not present

## 2021-10-24 DIAGNOSIS — F0390 Unspecified dementia without behavioral disturbance: Secondary | ICD-10-CM | POA: Diagnosis not present

## 2021-10-24 DIAGNOSIS — D509 Iron deficiency anemia, unspecified: Secondary | ICD-10-CM | POA: Diagnosis not present

## 2021-10-24 DIAGNOSIS — R278 Other lack of coordination: Secondary | ICD-10-CM | POA: Diagnosis not present

## 2021-10-24 DIAGNOSIS — M6250 Muscle wasting and atrophy, not elsewhere classified, unspecified site: Secondary | ICD-10-CM | POA: Diagnosis not present

## 2021-10-24 DIAGNOSIS — E1165 Type 2 diabetes mellitus with hyperglycemia: Secondary | ICD-10-CM | POA: Diagnosis not present

## 2021-10-24 DIAGNOSIS — F0150 Vascular dementia without behavioral disturbance: Secondary | ICD-10-CM | POA: Diagnosis not present

## 2021-10-26 DIAGNOSIS — N390 Urinary tract infection, site not specified: Secondary | ICD-10-CM | POA: Diagnosis not present

## 2021-10-26 DIAGNOSIS — R269 Unspecified abnormalities of gait and mobility: Secondary | ICD-10-CM | POA: Diagnosis not present

## 2021-10-26 DIAGNOSIS — U071 COVID-19: Secondary | ICD-10-CM | POA: Diagnosis not present

## 2021-10-26 DIAGNOSIS — F01511 Vascular dementia, unspecified severity, with agitation: Secondary | ICD-10-CM | POA: Diagnosis not present

## 2021-11-07 DIAGNOSIS — F0150 Vascular dementia without behavioral disturbance: Secondary | ICD-10-CM | POA: Diagnosis not present

## 2021-11-07 DIAGNOSIS — F0390 Unspecified dementia without behavioral disturbance: Secondary | ICD-10-CM | POA: Diagnosis not present

## 2021-11-07 DIAGNOSIS — R262 Difficulty in walking, not elsewhere classified: Secondary | ICD-10-CM | POA: Diagnosis not present

## 2021-11-07 DIAGNOSIS — M6281 Muscle weakness (generalized): Secondary | ICD-10-CM | POA: Diagnosis not present

## 2021-11-07 DIAGNOSIS — R251 Tremor, unspecified: Secondary | ICD-10-CM | POA: Diagnosis not present

## 2021-11-07 DIAGNOSIS — D509 Iron deficiency anemia, unspecified: Secondary | ICD-10-CM | POA: Diagnosis not present

## 2021-11-07 DIAGNOSIS — Z9181 History of falling: Secondary | ICD-10-CM | POA: Diagnosis not present

## 2021-11-07 DIAGNOSIS — R278 Other lack of coordination: Secondary | ICD-10-CM | POA: Diagnosis not present

## 2021-11-07 DIAGNOSIS — E1165 Type 2 diabetes mellitus with hyperglycemia: Secondary | ICD-10-CM | POA: Diagnosis not present

## 2021-11-07 DIAGNOSIS — M6250 Muscle wasting and atrophy, not elsewhere classified, unspecified site: Secondary | ICD-10-CM | POA: Diagnosis not present

## 2021-11-07 DIAGNOSIS — I69815 Cognitive social or emotional deficit following other cerebrovascular disease: Secondary | ICD-10-CM | POA: Diagnosis not present

## 2021-11-07 DIAGNOSIS — I10 Essential (primary) hypertension: Secondary | ICD-10-CM | POA: Diagnosis not present

## 2021-11-08 DIAGNOSIS — F0390 Unspecified dementia without behavioral disturbance: Secondary | ICD-10-CM | POA: Diagnosis not present

## 2021-11-08 DIAGNOSIS — I10 Essential (primary) hypertension: Secondary | ICD-10-CM | POA: Diagnosis not present

## 2021-11-08 DIAGNOSIS — R262 Difficulty in walking, not elsewhere classified: Secondary | ICD-10-CM | POA: Diagnosis not present

## 2021-11-08 DIAGNOSIS — R278 Other lack of coordination: Secondary | ICD-10-CM | POA: Diagnosis not present

## 2021-11-08 DIAGNOSIS — M6250 Muscle wasting and atrophy, not elsewhere classified, unspecified site: Secondary | ICD-10-CM | POA: Diagnosis not present

## 2021-11-09 DIAGNOSIS — F0150 Vascular dementia without behavioral disturbance: Secondary | ICD-10-CM | POA: Diagnosis not present

## 2021-11-09 DIAGNOSIS — E1165 Type 2 diabetes mellitus with hyperglycemia: Secondary | ICD-10-CM | POA: Diagnosis not present

## 2021-11-09 DIAGNOSIS — D509 Iron deficiency anemia, unspecified: Secondary | ICD-10-CM | POA: Diagnosis not present

## 2021-11-09 DIAGNOSIS — R262 Difficulty in walking, not elsewhere classified: Secondary | ICD-10-CM | POA: Diagnosis not present

## 2021-11-09 DIAGNOSIS — I69815 Cognitive social or emotional deficit following other cerebrovascular disease: Secondary | ICD-10-CM | POA: Diagnosis not present

## 2021-11-09 DIAGNOSIS — F0390 Unspecified dementia without behavioral disturbance: Secondary | ICD-10-CM | POA: Diagnosis not present

## 2021-11-09 DIAGNOSIS — M6281 Muscle weakness (generalized): Secondary | ICD-10-CM | POA: Diagnosis not present

## 2021-11-09 DIAGNOSIS — R278 Other lack of coordination: Secondary | ICD-10-CM | POA: Diagnosis not present

## 2021-11-09 DIAGNOSIS — M6250 Muscle wasting and atrophy, not elsewhere classified, unspecified site: Secondary | ICD-10-CM | POA: Diagnosis not present

## 2021-11-09 DIAGNOSIS — Z9181 History of falling: Secondary | ICD-10-CM | POA: Diagnosis not present

## 2021-11-09 DIAGNOSIS — I10 Essential (primary) hypertension: Secondary | ICD-10-CM | POA: Diagnosis not present

## 2021-11-09 DIAGNOSIS — R251 Tremor, unspecified: Secondary | ICD-10-CM | POA: Diagnosis not present

## 2021-11-10 DIAGNOSIS — K219 Gastro-esophageal reflux disease without esophagitis: Secondary | ICD-10-CM | POA: Diagnosis not present

## 2021-11-14 DIAGNOSIS — M6281 Muscle weakness (generalized): Secondary | ICD-10-CM | POA: Diagnosis not present

## 2021-11-14 DIAGNOSIS — M6250 Muscle wasting and atrophy, not elsewhere classified, unspecified site: Secondary | ICD-10-CM | POA: Diagnosis not present

## 2021-11-14 DIAGNOSIS — R251 Tremor, unspecified: Secondary | ICD-10-CM | POA: Diagnosis not present

## 2021-11-14 DIAGNOSIS — I10 Essential (primary) hypertension: Secondary | ICD-10-CM | POA: Diagnosis not present

## 2021-11-14 DIAGNOSIS — R278 Other lack of coordination: Secondary | ICD-10-CM | POA: Diagnosis not present

## 2021-11-14 DIAGNOSIS — I69815 Cognitive social or emotional deficit following other cerebrovascular disease: Secondary | ICD-10-CM | POA: Diagnosis not present

## 2021-11-14 DIAGNOSIS — R262 Difficulty in walking, not elsewhere classified: Secondary | ICD-10-CM | POA: Diagnosis not present

## 2021-11-14 DIAGNOSIS — Z9181 History of falling: Secondary | ICD-10-CM | POA: Diagnosis not present

## 2021-11-14 DIAGNOSIS — D509 Iron deficiency anemia, unspecified: Secondary | ICD-10-CM | POA: Diagnosis not present

## 2021-11-14 DIAGNOSIS — F0150 Vascular dementia without behavioral disturbance: Secondary | ICD-10-CM | POA: Diagnosis not present

## 2021-11-14 DIAGNOSIS — F0390 Unspecified dementia without behavioral disturbance: Secondary | ICD-10-CM | POA: Diagnosis not present

## 2021-11-14 DIAGNOSIS — E1165 Type 2 diabetes mellitus with hyperglycemia: Secondary | ICD-10-CM | POA: Diagnosis not present

## 2021-11-15 DIAGNOSIS — M6250 Muscle wasting and atrophy, not elsewhere classified, unspecified site: Secondary | ICD-10-CM | POA: Diagnosis not present

## 2021-11-15 DIAGNOSIS — F0390 Unspecified dementia without behavioral disturbance: Secondary | ICD-10-CM | POA: Diagnosis not present

## 2021-11-15 DIAGNOSIS — R278 Other lack of coordination: Secondary | ICD-10-CM | POA: Diagnosis not present

## 2021-11-15 DIAGNOSIS — I10 Essential (primary) hypertension: Secondary | ICD-10-CM | POA: Diagnosis not present

## 2021-11-15 DIAGNOSIS — R262 Difficulty in walking, not elsewhere classified: Secondary | ICD-10-CM | POA: Diagnosis not present

## 2021-11-16 DIAGNOSIS — F411 Generalized anxiety disorder: Secondary | ICD-10-CM | POA: Diagnosis not present

## 2021-11-16 DIAGNOSIS — R278 Other lack of coordination: Secondary | ICD-10-CM | POA: Diagnosis not present

## 2021-11-16 DIAGNOSIS — E1165 Type 2 diabetes mellitus with hyperglycemia: Secondary | ICD-10-CM | POA: Diagnosis not present

## 2021-11-16 DIAGNOSIS — I69815 Cognitive social or emotional deficit following other cerebrovascular disease: Secondary | ICD-10-CM | POA: Diagnosis not present

## 2021-11-16 DIAGNOSIS — Z9181 History of falling: Secondary | ICD-10-CM | POA: Diagnosis not present

## 2021-11-16 DIAGNOSIS — F321 Major depressive disorder, single episode, moderate: Secondary | ICD-10-CM | POA: Diagnosis not present

## 2021-11-16 DIAGNOSIS — F01C2 Vascular dementia, severe, with psychotic disturbance: Secondary | ICD-10-CM | POA: Diagnosis not present

## 2021-11-16 DIAGNOSIS — M6250 Muscle wasting and atrophy, not elsewhere classified, unspecified site: Secondary | ICD-10-CM | POA: Diagnosis not present

## 2021-11-16 DIAGNOSIS — R262 Difficulty in walking, not elsewhere classified: Secondary | ICD-10-CM | POA: Diagnosis not present

## 2021-11-16 DIAGNOSIS — F0150 Vascular dementia without behavioral disturbance: Secondary | ICD-10-CM | POA: Diagnosis not present

## 2021-11-16 DIAGNOSIS — F251 Schizoaffective disorder, depressive type: Secondary | ICD-10-CM | POA: Diagnosis not present

## 2021-11-16 DIAGNOSIS — R251 Tremor, unspecified: Secondary | ICD-10-CM | POA: Diagnosis not present

## 2021-11-16 DIAGNOSIS — D509 Iron deficiency anemia, unspecified: Secondary | ICD-10-CM | POA: Diagnosis not present

## 2021-11-16 DIAGNOSIS — I10 Essential (primary) hypertension: Secondary | ICD-10-CM | POA: Diagnosis not present

## 2021-11-16 DIAGNOSIS — M6281 Muscle weakness (generalized): Secondary | ICD-10-CM | POA: Diagnosis not present

## 2021-11-16 DIAGNOSIS — F0390 Unspecified dementia without behavioral disturbance: Secondary | ICD-10-CM | POA: Diagnosis not present

## 2021-11-22 DIAGNOSIS — M6250 Muscle wasting and atrophy, not elsewhere classified, unspecified site: Secondary | ICD-10-CM | POA: Diagnosis not present

## 2021-11-22 DIAGNOSIS — R262 Difficulty in walking, not elsewhere classified: Secondary | ICD-10-CM | POA: Diagnosis not present

## 2021-11-22 DIAGNOSIS — R278 Other lack of coordination: Secondary | ICD-10-CM | POA: Diagnosis not present

## 2021-11-22 DIAGNOSIS — I10 Essential (primary) hypertension: Secondary | ICD-10-CM | POA: Diagnosis not present

## 2021-11-22 DIAGNOSIS — F0390 Unspecified dementia without behavioral disturbance: Secondary | ICD-10-CM | POA: Diagnosis not present

## 2021-11-23 DIAGNOSIS — F0390 Unspecified dementia without behavioral disturbance: Secondary | ICD-10-CM | POA: Diagnosis not present

## 2021-11-23 DIAGNOSIS — R278 Other lack of coordination: Secondary | ICD-10-CM | POA: Diagnosis not present

## 2021-11-23 DIAGNOSIS — I10 Essential (primary) hypertension: Secondary | ICD-10-CM | POA: Diagnosis not present

## 2021-11-23 DIAGNOSIS — R262 Difficulty in walking, not elsewhere classified: Secondary | ICD-10-CM | POA: Diagnosis not present

## 2021-11-23 DIAGNOSIS — M6250 Muscle wasting and atrophy, not elsewhere classified, unspecified site: Secondary | ICD-10-CM | POA: Diagnosis not present

## 2021-11-28 DIAGNOSIS — R278 Other lack of coordination: Secondary | ICD-10-CM | POA: Diagnosis not present

## 2021-11-28 DIAGNOSIS — I10 Essential (primary) hypertension: Secondary | ICD-10-CM | POA: Diagnosis not present

## 2021-11-28 DIAGNOSIS — F0390 Unspecified dementia without behavioral disturbance: Secondary | ICD-10-CM | POA: Diagnosis not present

## 2021-11-28 DIAGNOSIS — M6250 Muscle wasting and atrophy, not elsewhere classified, unspecified site: Secondary | ICD-10-CM | POA: Diagnosis not present

## 2021-11-28 DIAGNOSIS — R262 Difficulty in walking, not elsewhere classified: Secondary | ICD-10-CM | POA: Diagnosis not present

## 2021-11-29 DIAGNOSIS — R262 Difficulty in walking, not elsewhere classified: Secondary | ICD-10-CM | POA: Diagnosis not present

## 2021-11-29 DIAGNOSIS — I10 Essential (primary) hypertension: Secondary | ICD-10-CM | POA: Diagnosis not present

## 2021-11-29 DIAGNOSIS — M6250 Muscle wasting and atrophy, not elsewhere classified, unspecified site: Secondary | ICD-10-CM | POA: Diagnosis not present

## 2021-11-29 DIAGNOSIS — F0390 Unspecified dementia without behavioral disturbance: Secondary | ICD-10-CM | POA: Diagnosis not present

## 2021-11-29 DIAGNOSIS — R278 Other lack of coordination: Secondary | ICD-10-CM | POA: Diagnosis not present

## 2021-12-01 DIAGNOSIS — R262 Difficulty in walking, not elsewhere classified: Secondary | ICD-10-CM | POA: Diagnosis not present

## 2021-12-01 DIAGNOSIS — I10 Essential (primary) hypertension: Secondary | ICD-10-CM | POA: Diagnosis not present

## 2021-12-01 DIAGNOSIS — M6250 Muscle wasting and atrophy, not elsewhere classified, unspecified site: Secondary | ICD-10-CM | POA: Diagnosis not present

## 2021-12-01 DIAGNOSIS — F0390 Unspecified dementia without behavioral disturbance: Secondary | ICD-10-CM | POA: Diagnosis not present

## 2021-12-01 DIAGNOSIS — R278 Other lack of coordination: Secondary | ICD-10-CM | POA: Diagnosis not present

## 2021-12-06 DIAGNOSIS — R262 Difficulty in walking, not elsewhere classified: Secondary | ICD-10-CM | POA: Diagnosis not present

## 2021-12-06 DIAGNOSIS — I10 Essential (primary) hypertension: Secondary | ICD-10-CM | POA: Diagnosis not present

## 2021-12-06 DIAGNOSIS — F0390 Unspecified dementia without behavioral disturbance: Secondary | ICD-10-CM | POA: Diagnosis not present

## 2021-12-06 DIAGNOSIS — R278 Other lack of coordination: Secondary | ICD-10-CM | POA: Diagnosis not present

## 2021-12-06 DIAGNOSIS — M6250 Muscle wasting and atrophy, not elsewhere classified, unspecified site: Secondary | ICD-10-CM | POA: Diagnosis not present

## 2021-12-07 DIAGNOSIS — F0150 Vascular dementia without behavioral disturbance: Secondary | ICD-10-CM | POA: Diagnosis not present

## 2021-12-07 DIAGNOSIS — Z9181 History of falling: Secondary | ICD-10-CM | POA: Diagnosis not present

## 2021-12-07 DIAGNOSIS — M6250 Muscle wasting and atrophy, not elsewhere classified, unspecified site: Secondary | ICD-10-CM | POA: Diagnosis not present

## 2021-12-07 DIAGNOSIS — L600 Ingrowing nail: Secondary | ICD-10-CM | POA: Diagnosis not present

## 2021-12-07 DIAGNOSIS — D509 Iron deficiency anemia, unspecified: Secondary | ICD-10-CM | POA: Diagnosis not present

## 2021-12-07 DIAGNOSIS — R251 Tremor, unspecified: Secondary | ICD-10-CM | POA: Diagnosis not present

## 2021-12-07 DIAGNOSIS — M79674 Pain in right toe(s): Secondary | ICD-10-CM | POA: Diagnosis not present

## 2021-12-07 DIAGNOSIS — F0390 Unspecified dementia without behavioral disturbance: Secondary | ICD-10-CM | POA: Diagnosis not present

## 2021-12-07 DIAGNOSIS — R278 Other lack of coordination: Secondary | ICD-10-CM | POA: Diagnosis not present

## 2021-12-07 DIAGNOSIS — I10 Essential (primary) hypertension: Secondary | ICD-10-CM | POA: Diagnosis not present

## 2021-12-07 DIAGNOSIS — E1165 Type 2 diabetes mellitus with hyperglycemia: Secondary | ICD-10-CM | POA: Diagnosis not present

## 2021-12-07 DIAGNOSIS — I69815 Cognitive social or emotional deficit following other cerebrovascular disease: Secondary | ICD-10-CM | POA: Diagnosis not present

## 2021-12-07 DIAGNOSIS — M6281 Muscle weakness (generalized): Secondary | ICD-10-CM | POA: Diagnosis not present

## 2021-12-07 DIAGNOSIS — R262 Difficulty in walking, not elsewhere classified: Secondary | ICD-10-CM | POA: Diagnosis not present

## 2021-12-09 DIAGNOSIS — D509 Iron deficiency anemia, unspecified: Secondary | ICD-10-CM | POA: Diagnosis not present

## 2021-12-09 DIAGNOSIS — R262 Difficulty in walking, not elsewhere classified: Secondary | ICD-10-CM | POA: Diagnosis not present

## 2021-12-09 DIAGNOSIS — F0390 Unspecified dementia without behavioral disturbance: Secondary | ICD-10-CM | POA: Diagnosis not present

## 2021-12-09 DIAGNOSIS — F0150 Vascular dementia without behavioral disturbance: Secondary | ICD-10-CM | POA: Diagnosis not present

## 2021-12-09 DIAGNOSIS — M6281 Muscle weakness (generalized): Secondary | ICD-10-CM | POA: Diagnosis not present

## 2021-12-09 DIAGNOSIS — Z9181 History of falling: Secondary | ICD-10-CM | POA: Diagnosis not present

## 2021-12-09 DIAGNOSIS — R251 Tremor, unspecified: Secondary | ICD-10-CM | POA: Diagnosis not present

## 2021-12-09 DIAGNOSIS — E1165 Type 2 diabetes mellitus with hyperglycemia: Secondary | ICD-10-CM | POA: Diagnosis not present

## 2021-12-09 DIAGNOSIS — I10 Essential (primary) hypertension: Secondary | ICD-10-CM | POA: Diagnosis not present

## 2021-12-09 DIAGNOSIS — R278 Other lack of coordination: Secondary | ICD-10-CM | POA: Diagnosis not present

## 2021-12-09 DIAGNOSIS — M6250 Muscle wasting and atrophy, not elsewhere classified, unspecified site: Secondary | ICD-10-CM | POA: Diagnosis not present

## 2021-12-09 DIAGNOSIS — I69815 Cognitive social or emotional deficit following other cerebrovascular disease: Secondary | ICD-10-CM | POA: Diagnosis not present

## 2021-12-13 DIAGNOSIS — R278 Other lack of coordination: Secondary | ICD-10-CM | POA: Diagnosis not present

## 2021-12-13 DIAGNOSIS — R262 Difficulty in walking, not elsewhere classified: Secondary | ICD-10-CM | POA: Diagnosis not present

## 2021-12-13 DIAGNOSIS — I10 Essential (primary) hypertension: Secondary | ICD-10-CM | POA: Diagnosis not present

## 2021-12-13 DIAGNOSIS — F0390 Unspecified dementia without behavioral disturbance: Secondary | ICD-10-CM | POA: Diagnosis not present

## 2021-12-13 DIAGNOSIS — M6250 Muscle wasting and atrophy, not elsewhere classified, unspecified site: Secondary | ICD-10-CM | POA: Diagnosis not present

## 2021-12-14 DIAGNOSIS — R278 Other lack of coordination: Secondary | ICD-10-CM | POA: Diagnosis not present

## 2021-12-14 DIAGNOSIS — R262 Difficulty in walking, not elsewhere classified: Secondary | ICD-10-CM | POA: Diagnosis not present

## 2021-12-14 DIAGNOSIS — F0390 Unspecified dementia without behavioral disturbance: Secondary | ICD-10-CM | POA: Diagnosis not present

## 2021-12-14 DIAGNOSIS — N390 Urinary tract infection, site not specified: Secondary | ICD-10-CM | POA: Diagnosis not present

## 2021-12-14 DIAGNOSIS — M6250 Muscle wasting and atrophy, not elsewhere classified, unspecified site: Secondary | ICD-10-CM | POA: Diagnosis not present

## 2021-12-14 DIAGNOSIS — I10 Essential (primary) hypertension: Secondary | ICD-10-CM | POA: Diagnosis not present

## 2021-12-14 DIAGNOSIS — F01511 Vascular dementia, unspecified severity, with agitation: Secondary | ICD-10-CM | POA: Diagnosis not present

## 2021-12-14 DIAGNOSIS — R269 Unspecified abnormalities of gait and mobility: Secondary | ICD-10-CM | POA: Diagnosis not present

## 2021-12-15 DIAGNOSIS — R278 Other lack of coordination: Secondary | ICD-10-CM | POA: Diagnosis not present

## 2021-12-15 DIAGNOSIS — R262 Difficulty in walking, not elsewhere classified: Secondary | ICD-10-CM | POA: Diagnosis not present

## 2021-12-15 DIAGNOSIS — F0390 Unspecified dementia without behavioral disturbance: Secondary | ICD-10-CM | POA: Diagnosis not present

## 2021-12-15 DIAGNOSIS — M6250 Muscle wasting and atrophy, not elsewhere classified, unspecified site: Secondary | ICD-10-CM | POA: Diagnosis not present

## 2021-12-15 DIAGNOSIS — I10 Essential (primary) hypertension: Secondary | ICD-10-CM | POA: Diagnosis not present

## 2021-12-16 DIAGNOSIS — I10 Essential (primary) hypertension: Secondary | ICD-10-CM | POA: Diagnosis not present

## 2021-12-16 DIAGNOSIS — E039 Hypothyroidism, unspecified: Secondary | ICD-10-CM | POA: Diagnosis not present

## 2021-12-16 DIAGNOSIS — E785 Hyperlipidemia, unspecified: Secondary | ICD-10-CM | POA: Diagnosis not present

## 2021-12-16 DIAGNOSIS — E1165 Type 2 diabetes mellitus with hyperglycemia: Secondary | ICD-10-CM | POA: Diagnosis not present

## 2021-12-20 DIAGNOSIS — R251 Tremor, unspecified: Secondary | ICD-10-CM | POA: Diagnosis not present

## 2021-12-20 DIAGNOSIS — F0150 Vascular dementia without behavioral disturbance: Secondary | ICD-10-CM | POA: Diagnosis not present

## 2021-12-20 DIAGNOSIS — R262 Difficulty in walking, not elsewhere classified: Secondary | ICD-10-CM | POA: Diagnosis not present

## 2021-12-20 DIAGNOSIS — R278 Other lack of coordination: Secondary | ICD-10-CM | POA: Diagnosis not present

## 2021-12-20 DIAGNOSIS — D539 Nutritional anemia, unspecified: Secondary | ICD-10-CM | POA: Diagnosis not present

## 2021-12-20 DIAGNOSIS — M6250 Muscle wasting and atrophy, not elsewhere classified, unspecified site: Secondary | ICD-10-CM | POA: Diagnosis not present

## 2021-12-20 DIAGNOSIS — Z9181 History of falling: Secondary | ICD-10-CM | POA: Diagnosis not present

## 2021-12-20 DIAGNOSIS — N1832 Chronic kidney disease, stage 3b: Secondary | ICD-10-CM | POA: Diagnosis not present

## 2021-12-20 DIAGNOSIS — M6281 Muscle weakness (generalized): Secondary | ICD-10-CM | POA: Diagnosis not present

## 2021-12-20 DIAGNOSIS — I69815 Cognitive social or emotional deficit following other cerebrovascular disease: Secondary | ICD-10-CM | POA: Diagnosis not present

## 2021-12-20 DIAGNOSIS — E1165 Type 2 diabetes mellitus with hyperglycemia: Secondary | ICD-10-CM | POA: Diagnosis not present

## 2021-12-20 DIAGNOSIS — D509 Iron deficiency anemia, unspecified: Secondary | ICD-10-CM | POA: Diagnosis not present

## 2021-12-20 DIAGNOSIS — I10 Essential (primary) hypertension: Secondary | ICD-10-CM | POA: Diagnosis not present

## 2021-12-20 DIAGNOSIS — F0390 Unspecified dementia without behavioral disturbance: Secondary | ICD-10-CM | POA: Diagnosis not present

## 2021-12-21 DIAGNOSIS — I10 Essential (primary) hypertension: Secondary | ICD-10-CM | POA: Diagnosis not present

## 2021-12-21 DIAGNOSIS — F251 Schizoaffective disorder, depressive type: Secondary | ICD-10-CM | POA: Diagnosis not present

## 2021-12-21 DIAGNOSIS — F321 Major depressive disorder, single episode, moderate: Secondary | ICD-10-CM | POA: Diagnosis not present

## 2021-12-21 DIAGNOSIS — M6250 Muscle wasting and atrophy, not elsewhere classified, unspecified site: Secondary | ICD-10-CM | POA: Diagnosis not present

## 2021-12-21 DIAGNOSIS — R278 Other lack of coordination: Secondary | ICD-10-CM | POA: Diagnosis not present

## 2021-12-21 DIAGNOSIS — F0390 Unspecified dementia without behavioral disturbance: Secondary | ICD-10-CM | POA: Diagnosis not present

## 2021-12-21 DIAGNOSIS — D539 Nutritional anemia, unspecified: Secondary | ICD-10-CM | POA: Diagnosis not present

## 2021-12-21 DIAGNOSIS — F01C2 Vascular dementia, severe, with psychotic disturbance: Secondary | ICD-10-CM | POA: Diagnosis not present

## 2021-12-21 DIAGNOSIS — R262 Difficulty in walking, not elsewhere classified: Secondary | ICD-10-CM | POA: Diagnosis not present

## 2021-12-21 DIAGNOSIS — F411 Generalized anxiety disorder: Secondary | ICD-10-CM | POA: Diagnosis not present

## 2021-12-22 DIAGNOSIS — M6250 Muscle wasting and atrophy, not elsewhere classified, unspecified site: Secondary | ICD-10-CM | POA: Diagnosis not present

## 2021-12-22 DIAGNOSIS — I10 Essential (primary) hypertension: Secondary | ICD-10-CM | POA: Diagnosis not present

## 2021-12-22 DIAGNOSIS — R262 Difficulty in walking, not elsewhere classified: Secondary | ICD-10-CM | POA: Diagnosis not present

## 2021-12-22 DIAGNOSIS — R278 Other lack of coordination: Secondary | ICD-10-CM | POA: Diagnosis not present

## 2021-12-22 DIAGNOSIS — F0390 Unspecified dementia without behavioral disturbance: Secondary | ICD-10-CM | POA: Diagnosis not present

## 2021-12-22 DIAGNOSIS — E1165 Type 2 diabetes mellitus with hyperglycemia: Secondary | ICD-10-CM | POA: Diagnosis not present

## 2021-12-22 DIAGNOSIS — D509 Iron deficiency anemia, unspecified: Secondary | ICD-10-CM | POA: Diagnosis not present

## 2021-12-22 DIAGNOSIS — I69815 Cognitive social or emotional deficit following other cerebrovascular disease: Secondary | ICD-10-CM | POA: Diagnosis not present

## 2021-12-22 DIAGNOSIS — R251 Tremor, unspecified: Secondary | ICD-10-CM | POA: Diagnosis not present

## 2021-12-22 DIAGNOSIS — F0150 Vascular dementia without behavioral disturbance: Secondary | ICD-10-CM | POA: Diagnosis not present

## 2021-12-22 DIAGNOSIS — Z9181 History of falling: Secondary | ICD-10-CM | POA: Diagnosis not present

## 2021-12-22 DIAGNOSIS — M6281 Muscle weakness (generalized): Secondary | ICD-10-CM | POA: Diagnosis not present

## 2021-12-27 DIAGNOSIS — F0390 Unspecified dementia without behavioral disturbance: Secondary | ICD-10-CM | POA: Diagnosis not present

## 2021-12-27 DIAGNOSIS — I10 Essential (primary) hypertension: Secondary | ICD-10-CM | POA: Diagnosis not present

## 2021-12-27 DIAGNOSIS — D509 Iron deficiency anemia, unspecified: Secondary | ICD-10-CM | POA: Diagnosis not present

## 2021-12-27 DIAGNOSIS — M6281 Muscle weakness (generalized): Secondary | ICD-10-CM | POA: Diagnosis not present

## 2021-12-27 DIAGNOSIS — E1165 Type 2 diabetes mellitus with hyperglycemia: Secondary | ICD-10-CM | POA: Diagnosis not present

## 2021-12-27 DIAGNOSIS — R262 Difficulty in walking, not elsewhere classified: Secondary | ICD-10-CM | POA: Diagnosis not present

## 2021-12-27 DIAGNOSIS — R278 Other lack of coordination: Secondary | ICD-10-CM | POA: Diagnosis not present

## 2021-12-27 DIAGNOSIS — Z9181 History of falling: Secondary | ICD-10-CM | POA: Diagnosis not present

## 2021-12-27 DIAGNOSIS — R251 Tremor, unspecified: Secondary | ICD-10-CM | POA: Diagnosis not present

## 2021-12-27 DIAGNOSIS — F0150 Vascular dementia without behavioral disturbance: Secondary | ICD-10-CM | POA: Diagnosis not present

## 2021-12-27 DIAGNOSIS — M6250 Muscle wasting and atrophy, not elsewhere classified, unspecified site: Secondary | ICD-10-CM | POA: Diagnosis not present

## 2021-12-27 DIAGNOSIS — I69815 Cognitive social or emotional deficit following other cerebrovascular disease: Secondary | ICD-10-CM | POA: Diagnosis not present

## 2021-12-28 DIAGNOSIS — F01511 Vascular dementia, unspecified severity, with agitation: Secondary | ICD-10-CM | POA: Diagnosis not present

## 2021-12-28 DIAGNOSIS — F0390 Unspecified dementia without behavioral disturbance: Secondary | ICD-10-CM | POA: Diagnosis not present

## 2021-12-28 DIAGNOSIS — E1165 Type 2 diabetes mellitus with hyperglycemia: Secondary | ICD-10-CM | POA: Diagnosis not present

## 2021-12-28 DIAGNOSIS — I10 Essential (primary) hypertension: Secondary | ICD-10-CM | POA: Diagnosis not present

## 2021-12-28 DIAGNOSIS — R262 Difficulty in walking, not elsewhere classified: Secondary | ICD-10-CM | POA: Diagnosis not present

## 2021-12-28 DIAGNOSIS — R278 Other lack of coordination: Secondary | ICD-10-CM | POA: Diagnosis not present

## 2021-12-28 DIAGNOSIS — R269 Unspecified abnormalities of gait and mobility: Secondary | ICD-10-CM | POA: Diagnosis not present

## 2021-12-28 DIAGNOSIS — M6250 Muscle wasting and atrophy, not elsewhere classified, unspecified site: Secondary | ICD-10-CM | POA: Diagnosis not present

## 2021-12-29 DIAGNOSIS — Z9181 History of falling: Secondary | ICD-10-CM | POA: Diagnosis not present

## 2021-12-29 DIAGNOSIS — M6250 Muscle wasting and atrophy, not elsewhere classified, unspecified site: Secondary | ICD-10-CM | POA: Diagnosis not present

## 2021-12-29 DIAGNOSIS — I10 Essential (primary) hypertension: Secondary | ICD-10-CM | POA: Diagnosis not present

## 2021-12-29 DIAGNOSIS — I69815 Cognitive social or emotional deficit following other cerebrovascular disease: Secondary | ICD-10-CM | POA: Diagnosis not present

## 2021-12-29 DIAGNOSIS — R278 Other lack of coordination: Secondary | ICD-10-CM | POA: Diagnosis not present

## 2021-12-29 DIAGNOSIS — R251 Tremor, unspecified: Secondary | ICD-10-CM | POA: Diagnosis not present

## 2021-12-29 DIAGNOSIS — M6281 Muscle weakness (generalized): Secondary | ICD-10-CM | POA: Diagnosis not present

## 2021-12-29 DIAGNOSIS — E1165 Type 2 diabetes mellitus with hyperglycemia: Secondary | ICD-10-CM | POA: Diagnosis not present

## 2021-12-29 DIAGNOSIS — F0150 Vascular dementia without behavioral disturbance: Secondary | ICD-10-CM | POA: Diagnosis not present

## 2021-12-29 DIAGNOSIS — R262 Difficulty in walking, not elsewhere classified: Secondary | ICD-10-CM | POA: Diagnosis not present

## 2021-12-29 DIAGNOSIS — D509 Iron deficiency anemia, unspecified: Secondary | ICD-10-CM | POA: Diagnosis not present

## 2021-12-29 DIAGNOSIS — F0390 Unspecified dementia without behavioral disturbance: Secondary | ICD-10-CM | POA: Diagnosis not present

## 2022-01-03 DIAGNOSIS — F0150 Vascular dementia without behavioral disturbance: Secondary | ICD-10-CM | POA: Diagnosis not present

## 2022-01-03 DIAGNOSIS — I10 Essential (primary) hypertension: Secondary | ICD-10-CM | POA: Diagnosis not present

## 2022-01-03 DIAGNOSIS — F0390 Unspecified dementia without behavioral disturbance: Secondary | ICD-10-CM | POA: Diagnosis not present

## 2022-01-03 DIAGNOSIS — R262 Difficulty in walking, not elsewhere classified: Secondary | ICD-10-CM | POA: Diagnosis not present

## 2022-01-03 DIAGNOSIS — M6281 Muscle weakness (generalized): Secondary | ICD-10-CM | POA: Diagnosis not present

## 2022-01-03 DIAGNOSIS — R278 Other lack of coordination: Secondary | ICD-10-CM | POA: Diagnosis not present

## 2022-01-03 DIAGNOSIS — E1165 Type 2 diabetes mellitus with hyperglycemia: Secondary | ICD-10-CM | POA: Diagnosis not present

## 2022-01-03 DIAGNOSIS — M6250 Muscle wasting and atrophy, not elsewhere classified, unspecified site: Secondary | ICD-10-CM | POA: Diagnosis not present

## 2022-01-03 DIAGNOSIS — I69815 Cognitive social or emotional deficit following other cerebrovascular disease: Secondary | ICD-10-CM | POA: Diagnosis not present

## 2022-01-03 DIAGNOSIS — D509 Iron deficiency anemia, unspecified: Secondary | ICD-10-CM | POA: Diagnosis not present

## 2022-01-03 DIAGNOSIS — Z9181 History of falling: Secondary | ICD-10-CM | POA: Diagnosis not present

## 2022-01-03 DIAGNOSIS — R251 Tremor, unspecified: Secondary | ICD-10-CM | POA: Diagnosis not present

## 2022-01-04 DIAGNOSIS — R262 Difficulty in walking, not elsewhere classified: Secondary | ICD-10-CM | POA: Diagnosis not present

## 2022-01-04 DIAGNOSIS — I10 Essential (primary) hypertension: Secondary | ICD-10-CM | POA: Diagnosis not present

## 2022-01-04 DIAGNOSIS — F0390 Unspecified dementia without behavioral disturbance: Secondary | ICD-10-CM | POA: Diagnosis not present

## 2022-01-04 DIAGNOSIS — M6250 Muscle wasting and atrophy, not elsewhere classified, unspecified site: Secondary | ICD-10-CM | POA: Diagnosis not present

## 2022-01-04 DIAGNOSIS — R278 Other lack of coordination: Secondary | ICD-10-CM | POA: Diagnosis not present

## 2022-01-06 DIAGNOSIS — D509 Iron deficiency anemia, unspecified: Secondary | ICD-10-CM | POA: Diagnosis not present

## 2022-01-06 DIAGNOSIS — R251 Tremor, unspecified: Secondary | ICD-10-CM | POA: Diagnosis not present

## 2022-01-06 DIAGNOSIS — F0150 Vascular dementia without behavioral disturbance: Secondary | ICD-10-CM | POA: Diagnosis not present

## 2022-01-06 DIAGNOSIS — M6281 Muscle weakness (generalized): Secondary | ICD-10-CM | POA: Diagnosis not present

## 2022-01-06 DIAGNOSIS — E1165 Type 2 diabetes mellitus with hyperglycemia: Secondary | ICD-10-CM | POA: Diagnosis not present

## 2022-01-06 DIAGNOSIS — I69815 Cognitive social or emotional deficit following other cerebrovascular disease: Secondary | ICD-10-CM | POA: Diagnosis not present

## 2022-01-06 DIAGNOSIS — Z9181 History of falling: Secondary | ICD-10-CM | POA: Diagnosis not present

## 2022-01-11 DIAGNOSIS — N390 Urinary tract infection, site not specified: Secondary | ICD-10-CM | POA: Diagnosis not present

## 2022-01-11 DIAGNOSIS — I10 Essential (primary) hypertension: Secondary | ICD-10-CM | POA: Diagnosis not present

## 2022-01-11 DIAGNOSIS — R269 Unspecified abnormalities of gait and mobility: Secondary | ICD-10-CM | POA: Diagnosis not present

## 2022-01-11 DIAGNOSIS — R262 Difficulty in walking, not elsewhere classified: Secondary | ICD-10-CM | POA: Diagnosis not present

## 2022-01-11 DIAGNOSIS — F0390 Unspecified dementia without behavioral disturbance: Secondary | ICD-10-CM | POA: Diagnosis not present

## 2022-01-11 DIAGNOSIS — R278 Other lack of coordination: Secondary | ICD-10-CM | POA: Diagnosis not present

## 2022-01-11 DIAGNOSIS — F01511 Vascular dementia, unspecified severity, with agitation: Secondary | ICD-10-CM | POA: Diagnosis not present

## 2022-01-11 DIAGNOSIS — M6250 Muscle wasting and atrophy, not elsewhere classified, unspecified site: Secondary | ICD-10-CM | POA: Diagnosis not present

## 2022-01-17 DIAGNOSIS — I69815 Cognitive social or emotional deficit following other cerebrovascular disease: Secondary | ICD-10-CM | POA: Diagnosis not present

## 2022-01-17 DIAGNOSIS — M6281 Muscle weakness (generalized): Secondary | ICD-10-CM | POA: Diagnosis not present

## 2022-01-17 DIAGNOSIS — R251 Tremor, unspecified: Secondary | ICD-10-CM | POA: Diagnosis not present

## 2022-01-17 DIAGNOSIS — D509 Iron deficiency anemia, unspecified: Secondary | ICD-10-CM | POA: Diagnosis not present

## 2022-01-17 DIAGNOSIS — Z9181 History of falling: Secondary | ICD-10-CM | POA: Diagnosis not present

## 2022-01-17 DIAGNOSIS — E1165 Type 2 diabetes mellitus with hyperglycemia: Secondary | ICD-10-CM | POA: Diagnosis not present

## 2022-01-17 DIAGNOSIS — F0150 Vascular dementia without behavioral disturbance: Secondary | ICD-10-CM | POA: Diagnosis not present

## 2022-01-18 DIAGNOSIS — F0390 Unspecified dementia without behavioral disturbance: Secondary | ICD-10-CM | POA: Diagnosis not present

## 2022-01-18 DIAGNOSIS — F01C2 Vascular dementia, severe, with psychotic disturbance: Secondary | ICD-10-CM | POA: Diagnosis not present

## 2022-01-18 DIAGNOSIS — F251 Schizoaffective disorder, depressive type: Secondary | ICD-10-CM | POA: Diagnosis not present

## 2022-01-18 DIAGNOSIS — F321 Major depressive disorder, single episode, moderate: Secondary | ICD-10-CM | POA: Diagnosis not present

## 2022-01-18 DIAGNOSIS — M6250 Muscle wasting and atrophy, not elsewhere classified, unspecified site: Secondary | ICD-10-CM | POA: Diagnosis not present

## 2022-01-18 DIAGNOSIS — R278 Other lack of coordination: Secondary | ICD-10-CM | POA: Diagnosis not present

## 2022-01-18 DIAGNOSIS — F411 Generalized anxiety disorder: Secondary | ICD-10-CM | POA: Diagnosis not present

## 2022-01-18 DIAGNOSIS — I10 Essential (primary) hypertension: Secondary | ICD-10-CM | POA: Diagnosis not present

## 2022-01-18 DIAGNOSIS — R262 Difficulty in walking, not elsewhere classified: Secondary | ICD-10-CM | POA: Diagnosis not present

## 2022-01-19 DIAGNOSIS — R251 Tremor, unspecified: Secondary | ICD-10-CM | POA: Diagnosis not present

## 2022-01-19 DIAGNOSIS — Z9181 History of falling: Secondary | ICD-10-CM | POA: Diagnosis not present

## 2022-01-19 DIAGNOSIS — D509 Iron deficiency anemia, unspecified: Secondary | ICD-10-CM | POA: Diagnosis not present

## 2022-01-19 DIAGNOSIS — F0150 Vascular dementia without behavioral disturbance: Secondary | ICD-10-CM | POA: Diagnosis not present

## 2022-01-19 DIAGNOSIS — F0390 Unspecified dementia without behavioral disturbance: Secondary | ICD-10-CM | POA: Diagnosis not present

## 2022-01-19 DIAGNOSIS — E1165 Type 2 diabetes mellitus with hyperglycemia: Secondary | ICD-10-CM | POA: Diagnosis not present

## 2022-01-19 DIAGNOSIS — I10 Essential (primary) hypertension: Secondary | ICD-10-CM | POA: Diagnosis not present

## 2022-01-19 DIAGNOSIS — I69815 Cognitive social or emotional deficit following other cerebrovascular disease: Secondary | ICD-10-CM | POA: Diagnosis not present

## 2022-01-19 DIAGNOSIS — M6281 Muscle weakness (generalized): Secondary | ICD-10-CM | POA: Diagnosis not present

## 2022-01-19 DIAGNOSIS — R278 Other lack of coordination: Secondary | ICD-10-CM | POA: Diagnosis not present

## 2022-01-19 DIAGNOSIS — M6250 Muscle wasting and atrophy, not elsewhere classified, unspecified site: Secondary | ICD-10-CM | POA: Diagnosis not present

## 2022-01-19 DIAGNOSIS — R262 Difficulty in walking, not elsewhere classified: Secondary | ICD-10-CM | POA: Diagnosis not present

## 2022-01-24 DIAGNOSIS — M6281 Muscle weakness (generalized): Secondary | ICD-10-CM | POA: Diagnosis not present

## 2022-01-24 DIAGNOSIS — F0150 Vascular dementia without behavioral disturbance: Secondary | ICD-10-CM | POA: Diagnosis not present

## 2022-01-24 DIAGNOSIS — D509 Iron deficiency anemia, unspecified: Secondary | ICD-10-CM | POA: Diagnosis not present

## 2022-01-24 DIAGNOSIS — E1165 Type 2 diabetes mellitus with hyperglycemia: Secondary | ICD-10-CM | POA: Diagnosis not present

## 2022-01-24 DIAGNOSIS — R251 Tremor, unspecified: Secondary | ICD-10-CM | POA: Diagnosis not present

## 2022-01-24 DIAGNOSIS — Z9181 History of falling: Secondary | ICD-10-CM | POA: Diagnosis not present

## 2022-01-24 DIAGNOSIS — I69815 Cognitive social or emotional deficit following other cerebrovascular disease: Secondary | ICD-10-CM | POA: Diagnosis not present

## 2022-01-26 DIAGNOSIS — M6250 Muscle wasting and atrophy, not elsewhere classified, unspecified site: Secondary | ICD-10-CM | POA: Diagnosis not present

## 2022-01-26 DIAGNOSIS — M6281 Muscle weakness (generalized): Secondary | ICD-10-CM | POA: Diagnosis not present

## 2022-01-26 DIAGNOSIS — I10 Essential (primary) hypertension: Secondary | ICD-10-CM | POA: Diagnosis not present

## 2022-01-26 DIAGNOSIS — R278 Other lack of coordination: Secondary | ICD-10-CM | POA: Diagnosis not present

## 2022-01-26 DIAGNOSIS — R251 Tremor, unspecified: Secondary | ICD-10-CM | POA: Diagnosis not present

## 2022-01-26 DIAGNOSIS — F0150 Vascular dementia without behavioral disturbance: Secondary | ICD-10-CM | POA: Diagnosis not present

## 2022-01-26 DIAGNOSIS — F0390 Unspecified dementia without behavioral disturbance: Secondary | ICD-10-CM | POA: Diagnosis not present

## 2022-01-26 DIAGNOSIS — D509 Iron deficiency anemia, unspecified: Secondary | ICD-10-CM | POA: Diagnosis not present

## 2022-01-26 DIAGNOSIS — Z9181 History of falling: Secondary | ICD-10-CM | POA: Diagnosis not present

## 2022-01-26 DIAGNOSIS — I69815 Cognitive social or emotional deficit following other cerebrovascular disease: Secondary | ICD-10-CM | POA: Diagnosis not present

## 2022-01-26 DIAGNOSIS — E1165 Type 2 diabetes mellitus with hyperglycemia: Secondary | ICD-10-CM | POA: Diagnosis not present

## 2022-01-26 DIAGNOSIS — R262 Difficulty in walking, not elsewhere classified: Secondary | ICD-10-CM | POA: Diagnosis not present

## 2022-01-31 DIAGNOSIS — Z23 Encounter for immunization: Secondary | ICD-10-CM | POA: Diagnosis not present

## 2022-01-31 DIAGNOSIS — M6281 Muscle weakness (generalized): Secondary | ICD-10-CM | POA: Diagnosis not present

## 2022-01-31 DIAGNOSIS — I69815 Cognitive social or emotional deficit following other cerebrovascular disease: Secondary | ICD-10-CM | POA: Diagnosis not present

## 2022-01-31 DIAGNOSIS — D509 Iron deficiency anemia, unspecified: Secondary | ICD-10-CM | POA: Diagnosis not present

## 2022-01-31 DIAGNOSIS — F0150 Vascular dementia without behavioral disturbance: Secondary | ICD-10-CM | POA: Diagnosis not present

## 2022-01-31 DIAGNOSIS — R251 Tremor, unspecified: Secondary | ICD-10-CM | POA: Diagnosis not present

## 2022-01-31 DIAGNOSIS — Z9181 History of falling: Secondary | ICD-10-CM | POA: Diagnosis not present

## 2022-01-31 DIAGNOSIS — E1165 Type 2 diabetes mellitus with hyperglycemia: Secondary | ICD-10-CM | POA: Diagnosis not present

## 2022-02-01 DIAGNOSIS — R262 Difficulty in walking, not elsewhere classified: Secondary | ICD-10-CM | POA: Diagnosis not present

## 2022-02-01 DIAGNOSIS — R278 Other lack of coordination: Secondary | ICD-10-CM | POA: Diagnosis not present

## 2022-02-01 DIAGNOSIS — Z79899 Other long term (current) drug therapy: Secondary | ICD-10-CM | POA: Diagnosis not present

## 2022-02-01 DIAGNOSIS — F0390 Unspecified dementia without behavioral disturbance: Secondary | ICD-10-CM | POA: Diagnosis not present

## 2022-02-01 DIAGNOSIS — I10 Essential (primary) hypertension: Secondary | ICD-10-CM | POA: Diagnosis not present

## 2022-02-01 DIAGNOSIS — M6250 Muscle wasting and atrophy, not elsewhere classified, unspecified site: Secondary | ICD-10-CM | POA: Diagnosis not present

## 2022-02-01 DIAGNOSIS — Z5181 Encounter for therapeutic drug level monitoring: Secondary | ICD-10-CM | POA: Diagnosis not present

## 2022-02-02 DIAGNOSIS — F0390 Unspecified dementia without behavioral disturbance: Secondary | ICD-10-CM | POA: Diagnosis not present

## 2022-02-02 DIAGNOSIS — I69815 Cognitive social or emotional deficit following other cerebrovascular disease: Secondary | ICD-10-CM | POA: Diagnosis not present

## 2022-02-02 DIAGNOSIS — M6250 Muscle wasting and atrophy, not elsewhere classified, unspecified site: Secondary | ICD-10-CM | POA: Diagnosis not present

## 2022-02-02 DIAGNOSIS — R262 Difficulty in walking, not elsewhere classified: Secondary | ICD-10-CM | POA: Diagnosis not present

## 2022-02-02 DIAGNOSIS — R278 Other lack of coordination: Secondary | ICD-10-CM | POA: Diagnosis not present

## 2022-02-02 DIAGNOSIS — I10 Essential (primary) hypertension: Secondary | ICD-10-CM | POA: Diagnosis not present

## 2022-02-02 DIAGNOSIS — F0150 Vascular dementia without behavioral disturbance: Secondary | ICD-10-CM | POA: Diagnosis not present

## 2022-02-02 DIAGNOSIS — D509 Iron deficiency anemia, unspecified: Secondary | ICD-10-CM | POA: Diagnosis not present

## 2022-02-02 DIAGNOSIS — E1165 Type 2 diabetes mellitus with hyperglycemia: Secondary | ICD-10-CM | POA: Diagnosis not present

## 2022-02-02 DIAGNOSIS — M6281 Muscle weakness (generalized): Secondary | ICD-10-CM | POA: Diagnosis not present

## 2022-02-02 DIAGNOSIS — Z9181 History of falling: Secondary | ICD-10-CM | POA: Diagnosis not present

## 2022-02-02 DIAGNOSIS — R251 Tremor, unspecified: Secondary | ICD-10-CM | POA: Diagnosis not present

## 2022-02-08 DIAGNOSIS — F0150 Vascular dementia without behavioral disturbance: Secondary | ICD-10-CM | POA: Diagnosis not present

## 2022-02-08 DIAGNOSIS — R269 Unspecified abnormalities of gait and mobility: Secondary | ICD-10-CM | POA: Diagnosis not present

## 2022-02-08 DIAGNOSIS — N390 Urinary tract infection, site not specified: Secondary | ICD-10-CM | POA: Diagnosis not present

## 2022-02-14 DIAGNOSIS — F0150 Vascular dementia without behavioral disturbance: Secondary | ICD-10-CM | POA: Diagnosis not present

## 2022-02-14 DIAGNOSIS — N390 Urinary tract infection, site not specified: Secondary | ICD-10-CM | POA: Diagnosis not present

## 2022-03-02 ENCOUNTER — Telehealth: Payer: Medicare Other | Admitting: Nurse Practitioner

## 2022-03-02 ENCOUNTER — Encounter: Payer: Self-pay | Admitting: Nurse Practitioner

## 2022-03-02 DIAGNOSIS — R5381 Other malaise: Secondary | ICD-10-CM | POA: Diagnosis not present

## 2022-03-02 DIAGNOSIS — F02818 Dementia in other diseases classified elsewhere, unspecified severity, with other behavioral disturbance: Secondary | ICD-10-CM

## 2022-03-02 DIAGNOSIS — Z515 Encounter for palliative care: Secondary | ICD-10-CM

## 2022-03-02 NOTE — Progress Notes (Addendum)
[facility] Consult Note Telephone: [phone]  Fax: [phone]    Date of encounter: 03/02/22 5:05 PM PATIENT NAME: Paula Andrews [address] [place] [address]   [phone] (home)  DOB: Sep 26, 1945 MRN: [id number] PRIMARY CARE PROVIDER:    [facility]  RESPONSIBLE PARTY:    Contact Information     Name Relation Home Work Mobile   [place] Spouse [phone]  [phone]   Glori Bickers Daughter   [phone]      Due to the COVID-19 crisis, this visit was done via telemedicine from my office and it was initiated and consent by this patient and or family.  I connected with [employer] with  [facility] on 03/02/22 by a video enabled telemedicine application and verified that I am speaking with the correct person using two identifiers.  Palliative Care was asked to follow this patient by consultation request of  Lajean Manes, MD [facility] to address advance care planning and complex medical decision making. This is the initial visit.                               ASSESSMENT AND PLAN / RECOMMENDATIONS:  1. Advance Care Planning;  DNR with golden at facility   2. Goals of Care: Goals include to maximize quality of life and symptom management. Our advance care planning conversation included a discussion about:    The value and importance of advance care planning  Exploration of personal, cultural or spiritual beliefs that might influence medical decisions  Exploration of goals of care in the event of a sudden injury or illness  Identification and preparation of a healthcare agent  Review and updating or creation of an advance directive document.   3. Debility secondary to dementia, continue to monitor,  Continue fall precautions. Monitor for aspiration.   01/02/2022 weight 144.5 lbs   4. Palliative care encounter; Palliative care encounter; Palliative medicine team will continue to  support patient, patient's family, and medical team. Visit consisted of counseling and education dealing with the complex and emotionally intense issues of symptom management and palliative care in the setting of serious and potentially life-threatening illness   Follow up Palliative Care Visit: Palliative care will continue to follow for complex medical decision making, advance care planning, and clarification of goals. Return 4 to 8 weeks or prn.   I spent 32 minutes providing this consultation. More than 50% of the time in this consultation was spent in counseling and care coordination. PPS: 40% Chief Complaint: Initial palliative consult for complex medical decision making, address goals, manage ongoing symptoms   HISTORY OF PRESENT ILLNESS:  Paula Andrews is a 76 y.o. year old female  with multiple medical problems including dementia, HTN, DM, Hypothyroidism, HLD. Last Hospitalized 09/18/2021 to 09/26/2021 for right lower lobe pneumonia, acute hypoxemic respiratory failure suspected aspiration pneumonia, acute metabolic encephalopathy in the setting of dementia, dehydration, deconditioning. She was stabilized and d/c back to [facility], locked memory care unit where she is a long term resident. [name] requires assistance for transfers, mobility, adl's, bathing, dressing, toileting per staff. [name] does feed herself with good appetite reported by staff. I called [employer] by telephone for telemedicine visit as video is not available. Attempted to contact son for further discussions of goc. Updated staff. Updates discussed, ros, no recent falls, wounds, infections, hospitalizations, good appetite, last weight 144.5 lbs.  Medications, medical goals, poc reviewed. Continue current plan, no new changes.    History obtained from review of EMR, discussion with primary team, and interview with family, facility staff/caregiver and/or Ms. Brunner.  I reviewed available labs, medications,  imaging, studies and related documents from the EMR.  Records reviewed and summarized above.    ROS 10 point system reviewed all negative except HPI   Physical Exam: Deferred I discussed the limitations of evaluation and management by telemedicine. The patient expressed understanding and agreed to proceed.  Thank you for the opportunity to participate in the care of [name]. Please call our office at [phone] if we can be of additional assistance.   Kennen Stammer Ihor Gully, NP

## 2022-03-31 DIAGNOSIS — E1165 Type 2 diabetes mellitus with hyperglycemia: Secondary | ICD-10-CM | POA: Diagnosis not present

## 2022-05-03 ENCOUNTER — Telehealth: Payer: Medicare Other | Admitting: Nurse Practitioner

## 2022-05-03 ENCOUNTER — Encounter: Payer: Self-pay | Admitting: Nurse Practitioner

## 2022-05-03 DIAGNOSIS — R5381 Other malaise: Secondary | ICD-10-CM

## 2022-05-03 DIAGNOSIS — F02818 Dementia in other diseases classified elsewhere, unspecified severity, with other behavioral disturbance: Secondary | ICD-10-CM

## 2022-05-03 DIAGNOSIS — Z515 Encounter for palliative care: Secondary | ICD-10-CM

## 2022-05-03 NOTE — Addendum Note (Signed)
Addended by: Shawn Stall on: 05/03/2022 06:02 PM   Modules accepted: Level of Service

## 2022-05-03 NOTE — Progress Notes (Signed)
[facility] Consult Note Telephone: [phone]  Fax: [phone]    Date of encounter: 05/03/22 5:51 PM PATIENT NAME: Paula Andrews [address] [place] [address]   [phone] (home)  DOB: 1945-07-08 MRN: [id number] PRIMARY CARE PROVIDER:    [place]   RESPONSIBLE PARTY:    Contact Information       Name Relation Home Work Mobile    [place] Spouse [phone]   [phone]    Glori Bickers Daughter     [phone]         Due to the COVID-19 crisis, this visit was done via telemedicine from my office and it was initiated and consent by this patient and or family.   I connected with Croshanda [employer] with  [facility] on 03/02/22 by a video enabled telemedicine application and verified that I am speaking with the correct person using two identifiers.  Palliative Care was asked to follow this patient by consultation request of  Lajean Manes, MD [facility] to address advance care planning and complex medical decision making. This is the initial visit.                               ASSESSMENT AND PLAN / RECOMMENDATIONS:  1. Advance Care Planning;  DNR with golden at facility   2. Goals of Care: Goals include to maximize quality of life and symptom management. Our advance care planning conversation included a discussion about:    The value and importance of advance care planning  Exploration of personal, cultural or spiritual beliefs that might influence medical decisions  Exploration of goals of care in the event of a sudden injury or illness  Identification and preparation of a healthcare agent  Review and updating or creation of an advance directive document.   3. Debility secondary to dementia, continue to monitor,  Continue fall precautions. Monitor for aspiration.    01/02/2022 weight 144.5 lbs 04/04/2022 weight 146 lbs 4. Palliative care encounter; Palliative care encounter;  Palliative medicine team will continue to support patient, patient's family, and medical team. Visit consisted of counseling and education dealing with the complex and emotionally intense issues of symptom management and palliative care in the setting of serious and potentially life-threatening illness   Follow up Palliative Care Visit: Palliative care will continue to follow for complex medical decision making, advance care planning, and clarification of goals. Return 4 to 8 weeks or prn.   I spent 31 minutes providing this consultation. More than 50% of the time in this consultation was spent in counseling and care coordination. PPS: 40% Chief Complaint: Initial palliative consult for complex medical decision making, address goals, manage ongoing symptoms   HISTORY OF PRESENT ILLNESS:  Paula Andrews is a 77 y.o. year old female  with multiple medical problems including dementia, HTN, DM, Hypothyroidism, HLD. Resides at [facility], [place] unit LTC. Paula Andrews requires assistance for transfers, mobility, adl's, bathing, dressing, toileting per staff. Paula Andrews does feed herself with good appetite reported by staff. Purpose of today PC f/u visit further discussion monitor trends of appetite, weights, monitor for functional, cognitive decline with chronic disease progression, assess any active symptoms, supportive role.I called [employer] by telephone for telemedicine visit as video is not available. Attempted to contact son for further discussions of goc. Updated staff. Updates discussed, ros, no recent falls, wounds, infections, hospitalizations.  Medications,  medical goals, poc reviewed. Continue current plan, no new changes. PC f/u visit further discussion monitor trends of appetite, weights, monitor for functional, cognitive decline with chronic disease progression, assess any active symptoms, supportive role.   History obtained from review of EMR, discussion with primary  team, and interview with family, facility staff/caregiver and/or Ms. Lessner.  I reviewed available labs, medications, imaging, studies and related documents from the EMR.  Records reviewed and summarized above.    Physical Exam: Deferred,  Nicci Vaughan Ihor Gully, NP

## 2022-05-24 ENCOUNTER — Telehealth: Payer: Self-pay | Admitting: Nurse Practitioner

## 2022-05-24 NOTE — Telephone Encounter (Signed)
I talked with [name] NP at [facility] with over the last 3 weeks, left leg pain with right leg swollen.  Results:  ABI severe PVD both sides Xray negative for fracture Venous doppler negative for clot I called Paula Andrews, talked about poc, medical goals. Results discussed and clinical presentation shared by [name] NP. Discussed with Paula Andrews will schedule upcoming visit in a few weeks for pc, options discussed declined Ortho referral at present time. Will add baby asa (will need to monitor for fall risk) with statin for PVD with age and co-morbidities is not unreasonable at this time to see if improves pain. Also will try topical voltaren recommended. Will try to avoid gabapentin with fall risk. Also with Cognitive impairment would likely not keep a boot on which Paula Andrews agrees. Will proceed conservative. Paula Andrews was thankful for phone call.   Total time 20 minutes Discussion 15 minutes Documentation 5 minutes

## 2022-06-21 ENCOUNTER — Telehealth: Payer: Medicare Other | Admitting: Nurse Practitioner

## 2022-06-21 ENCOUNTER — Encounter: Payer: Self-pay | Admitting: Nurse Practitioner

## 2022-06-21 DIAGNOSIS — R5381 Other malaise: Secondary | ICD-10-CM

## 2022-06-21 DIAGNOSIS — Z515 Encounter for palliative care: Secondary | ICD-10-CM

## 2022-06-21 DIAGNOSIS — F02818 Dementia in other diseases classified elsewhere, unspecified severity, with other behavioral disturbance: Secondary | ICD-10-CM

## 2022-06-21 DIAGNOSIS — R634 Abnormal weight loss: Secondary | ICD-10-CM

## 2022-06-21 NOTE — Progress Notes (Signed)
[facility] Consult Note Telephone: [phone]  Fax: [phone]    Date of encounter: 06/21/22 10:25 AM PATIENT NAME: Paula Andrews [address] [place] [address]   [phone] (home)  DOB: 07-04-1945 MRN: [id number] PRIMARY CARE PROVIDER:    [facility]; Locked [facility] Care Unit  RESPONSIBLE PARTY:    Contact Information     Name Relation Home Work Mobile   [place] Spouse [phone]  [phone]   Theresa Duty   [phone]          Due to the COVID-19 crisis, this visit was done via telemedicine from my office and it was initiated and consent by this patient and or family.   I connected with Kaitlyn [employer] with  [facility] on 03/02/22 by telephone as video not available  telemedicine application and verified that I am speaking with the correct person. Palliative Care was asked to follow this patient by consultation request of  [facility]; to address advance care planning and complex medical decision making. This is the initial visit.                               ASSESSMENT AND PLAN / RECOMMENDATIONS:  1. Advance Care Planning;  DNR with golden at facility   2. Debility with weight loss secondary to dementia, continue to monitor,  Continue fall precautions. Monitor for aspiration. Encourage Ms Paula Andrews to eat meals, assist, reviewed weights, continue to monitor weights, supplements.    01/02/2022 weight 144.5 lbs 04/04/2022 weight 146 lbs 06/03/2022 weight 143 lbs 3 lbs weight loss/2 months 4. Palliative care encounter; Palliative care encounter; Palliative medicine team will continue to support patient, patient's family, and medical team. Visit consisted of counseling and education dealing with the complex and emotionally intense issues of symptom management and palliative care in the setting of serious and potentially life-threatening illness   Follow up Palliative Care  Visit: Palliative care will continue to follow for complex medical decision making, advance care planning, and clarification of goals. Return 4 to 8 weeks or prn.   I spent 22 minutes providing this consultation. More than 50% of the time in this consultation was spent in counseling and care coordination. PPS: 40% Chief Complaint: Initial palliative consult for complex medical decision making, address goals, manage ongoing symptoms   HISTORY OF PRESENT ILLNESS:  CARISSE CHESSHIR is a 77 y.o. year old female  with multiple medical problems including dementia, HTN, DM, Hypothyroidism, HLD. Resides at [facility], [place] unit LTC. [name] requires assistance for transfers, mobility, adl's, bathing, dressing, toileting per staff. [name] does feed herself with good appetite reported by staff. Purpose of today PC f/u visit further discussion monitor trends of appetite, weights, monitor for functional, cognitive decline with chronic disease progression, assess any active symptoms, supportive role.I called Kaitlyn [employer] by telephone for telemedicine visit as video is not available. We talked about how Ms Paula Andrews has been doing, no recent falls, hospitalizations, infections, wounds. No functional or cognitive decline. Appetite has been stable, noted slight weight loss with current weight 152.5 lbs. Ms Paula Andrews behavior, moods have been stable, medications, goc, poc reviewed. Attempted to contact son for further discussions of goc. Updated staff. Updates discussed, ros, no recent falls, wounds, infections, hospitalizations. Continue current plan, no new changes. PC f/u visit further discussion monitor trends of appetite, weights, monitor for functional, cognitive  decline with chronic disease progression, assess any active symptoms, supportive role. Next visit will be PC in person. No current needs per staff.   History obtained from review of EMR, discussion with primary team, and interview with  family, facility staff/caregiver and/or [name].  I reviewed available labs, medications, imaging, studies and related documents from the EMR.  Records reviewed and summarized above.    Physical Exam: Deferred,   Thank you for the opportunity to participate in the care of [name]. Please call our office at [phone] if we can be of additional assistance.   Mahiya Kercheval Ihor Gully, NP

## 2022-08-14 ENCOUNTER — Non-Acute Institutional Stay: Payer: Medicare Other | Admitting: Nurse Practitioner

## 2022-08-14 DIAGNOSIS — R5381 Other malaise: Secondary | ICD-10-CM

## 2022-08-14 DIAGNOSIS — Z515 Encounter for palliative care: Secondary | ICD-10-CM

## 2022-08-14 DIAGNOSIS — F02818 Dementia in other diseases classified elsewhere, unspecified severity, with other behavioral disturbance: Secondary | ICD-10-CM

## 2022-08-14 NOTE — Progress Notes (Signed)
Therapist, [facility] Consult Note Telephone: [phone]  Fax: [phone]    Date of encounter: 08/14/22 6:33 PM PATIENT NAME: Paula Andrews [address] [place] [address]   [phone] (home)  DOB: 05/02/1945 MRN: [id number] PRIMARY CARE PROVIDER:    [facility], Lock[facility] care unit  RESPONSIBLE PARTY:    Contact Information     Name Relation Home Work Mobile   Paula Andrews Spouse [phone]  [phone]   Paula Andrews   [phone]          I met face to face with patient in facility. Palliative Care was asked to follow this patient by consultation request of  Paula Laughter, MD Paula Andrews to address advance care planning and complex medical decision making. This is the initial visit.                               ASSESSMENT AND PLAN / RECOMMENDATIONS:  1. Advance Care Planning;  DNR with golden at facility   2.  Debility secondary to dementia, continue to monitor, further discuss with son, goc. Continue fall precautions. Monitor for aspiration.   07/04/2022 weight 147 lbs 08/03/2022 weight 147.5 lbs   4. Palliative care encounter; Palliative care encounter; Palliative medicine team will continue to support patient, patient's family, and medical team. Visit consisted of counseling and education dealing with the complex and emotionally intense issues of symptom management and palliative care in the setting of serious and potentially life-threatening illness   Follow up Palliative Care Visit: Palliative care will continue to follow for complex medical decision making, advance care planning, and clarification of goals. Return 4 weeks or prn.   I spent 42 minutes providing this consultation. More than 50% of the time in this consultation was spent in counseling and care coordination. PPS: 40% Chief Complaint: Initial palliative consult for complex medical decision making, address goals, manage ongoing  symptoms   HISTORY OF PRESENT ILLNESS:  Paula Andrews is a 77 y.o. year old female  with multiple medical problems including dementia, HTN, DM, Hypothyroidism, HLD. Hospitalized 09/18/2021 to 09/26/2021 for right lower lobe pneumonia, acute hypoxemic respiratory failure suspected aspiration pneumonia, acute metabolic encephalopathy in the setting of dementia, dehydration, deconditioning. She was stabilized and d/c back to [facility], locked memory care unit where she is a long term resident. [name] requires assistance for transfers, mobility, adl's, bathing, dressing, toileting per staff. [name] does feed herself with fairly good appetite reported by staff. [name] is a DNR. Purpose of today PC f/u visit further discussion monitor trends of appetite, weights, monitor for functional, cognitive decline with chronic disease progression, assess any active symptoms, supportive role. At present [name] is sitting at the table with other residents feeding herself lunch, no s/s aspiration. [name] does make eye contact, answers brief simple questions, answers not appropriate. Limited discussion with cognitive impairment. [name] was cooperative with assessment. Support provided. Will contact son for further discussions of goc. Updated staff. Medications, goc, poc reviewed, currently [name] is stable. PC f/u visit further discussion monitor trends of appetite, weights, monitor for functional, cognitive decline with chronic disease progression, assess any active symptoms, supportive role.   History obtained from review of EMR, discussion with primary team, and interview with family, facility staff/caregiver and/or [name].  I reviewed available labs, medications, imaging, studies and related documents from the EMR.  Records reviewed  and summarized above.    ROS 10 point system reviewed all negative except HPI   Physical Exam: General: obese, pleasantly confused female ENMT: oral mucous  membranes moist CV: S1S2, RRR Pulmonary: breath sounds clear MSK: w/c Neuro:  + cognitive impairment Psych: non-anxious affect, Alert Thank you for the opportunity to participate in the care of [name]. Please call our office at [phone] if we can be of additional assistance.   Paula Andrews [place], NP

## 2022-10-02 ENCOUNTER — Emergency Department (HOSPITAL_COMMUNITY): Payer: Medicare Other

## 2022-10-02 ENCOUNTER — Emergency Department (HOSPITAL_COMMUNITY)
Admission: EM | Admit: 2022-10-02 | Discharge: 2022-10-03 | Disposition: A | Discharge status: Skilled Nursing Facility | Payer: Medicare Other | Attending: Emergency Medicine | Admitting: Emergency Medicine

## 2022-10-02 ENCOUNTER — Encounter (HOSPITAL_COMMUNITY): Payer: Self-pay

## 2022-10-02 ENCOUNTER — Other Ambulatory Visit: Payer: Self-pay

## 2022-10-02 DIAGNOSIS — R4182 Altered mental status, unspecified: Secondary | ICD-10-CM | POA: Diagnosis present

## 2022-10-02 DIAGNOSIS — E119 Type 2 diabetes mellitus without complications: Secondary | ICD-10-CM | POA: Insufficient documentation

## 2022-10-02 DIAGNOSIS — F0390 Unspecified dementia without behavioral disturbance: Secondary | ICD-10-CM | POA: Insufficient documentation

## 2022-10-02 DIAGNOSIS — Z79899 Other long term (current) drug therapy: Secondary | ICD-10-CM | POA: Insufficient documentation

## 2022-10-02 DIAGNOSIS — R410 Disorientation, unspecified: Secondary | ICD-10-CM | POA: Diagnosis not present

## 2022-10-02 DIAGNOSIS — Z7984 Long term (current) use of oral hypoglycemic drugs: Secondary | ICD-10-CM | POA: Diagnosis not present

## 2022-10-02 DIAGNOSIS — I10 Essential (primary) hypertension: Secondary | ICD-10-CM | POA: Insufficient documentation

## 2022-10-02 HISTORY — DX: Other seasonal allergic rhinitis: J30.2

## 2022-10-02 HISTORY — DX: Unspecified osteoarthritis, unspecified site: M19.90

## 2022-10-02 HISTORY — DX: Unspecified psychosis not due to a substance or known physiological condition: F29

## 2022-10-02 HISTORY — DX: Unspecified dementia, unspecified severity, with agitation: F03.911

## 2022-10-02 HISTORY — DX: Gastro-esophageal reflux disease without esophagitis: K21.9

## 2022-10-02 HISTORY — DX: Anxiety disorder, unspecified: F41.9

## 2022-10-02 HISTORY — DX: Depression, unspecified: F32.A

## 2022-10-02 LAB — CBC WITH DIFFERENTIAL/PLATELET
Abs Immature Granulocytes: 0.03 10*3/uL (ref 0.00–0.07)
Basophils Absolute: 0.1 10*3/uL (ref 0.0–0.1)
Basophils Relative: 1 %
Eosinophils Absolute: 0 10*3/uL (ref 0.0–0.5)
Eosinophils Relative: 0 %
HCT: 40.3 % (ref 36.0–46.0)
Hemoglobin: 12.2 g/dL (ref 12.0–15.0)
Immature Granulocytes: 0 %
Lymphocytes Relative: 10 %
Lymphs Abs: 0.9 10*3/uL (ref 0.7–4.0)
MCH: 25.4 pg — ABNORMAL LOW (ref 26.0–34.0)
MCHC: 30.3 g/dL (ref 30.0–36.0)
MCV: 84 fL (ref 80.0–100.0)
Monocytes Absolute: 0.7 10*3/uL (ref 0.1–1.0)
Monocytes Relative: 8 %
Neutro Abs: 6.8 10*3/uL (ref 1.7–7.7)
Neutrophils Relative %: 81 %
Platelets: 263 10*3/uL (ref 150–400)
RBC: 4.8 MIL/uL (ref 3.87–5.11)
RDW: 15.2 % (ref 11.5–15.5)
WBC: 8.5 10*3/uL (ref 4.0–10.5)
nRBC: 0 % (ref 0.0–0.2)

## 2022-10-02 LAB — URINALYSIS, ROUTINE W REFLEX MICROSCOPIC
Bilirubin Urine: NEGATIVE
Glucose, UA: NEGATIVE mg/dL
Hgb urine dipstick: NEGATIVE
Ketones, ur: 20 mg/dL — AB
Leukocytes,Ua: NEGATIVE
Nitrite: NEGATIVE
Protein, ur: 30 mg/dL — AB
Specific Gravity, Urine: 1.018 (ref 1.005–1.030)
pH: 5 (ref 5.0–8.0)

## 2022-10-02 LAB — COMPREHENSIVE METABOLIC PANEL
ALT: 18 U/L (ref 0–44)
AST: 32 U/L (ref 15–41)
Albumin: 3.9 g/dL (ref 3.5–5.0)
Alkaline Phosphatase: 76 U/L (ref 38–126)
Anion gap: 13 (ref 5–15)
BUN: 26 mg/dL — ABNORMAL HIGH (ref 8–23)
CO2: 22 mmol/L (ref 22–32)
Calcium: 9 mg/dL (ref 8.9–10.3)
Chloride: 105 mmol/L (ref 98–111)
Creatinine, Ser: 1.01 mg/dL — ABNORMAL HIGH (ref 0.44–1.00)
GFR, Estimated: 58 mL/min — ABNORMAL LOW (ref >60)
Glucose, Bld: 126 mg/dL — ABNORMAL HIGH (ref 70–99)
Potassium: 3.9 mmol/L (ref 3.5–5.1)
Sodium: 140 mmol/L (ref 135–145)
Total Bilirubin: 1.3 mg/dL — ABNORMAL HIGH (ref 0.3–1.2)
Total Protein: 7.1 g/dL (ref 6.5–8.1)

## 2022-10-02 MED ORDER — SODIUM CHLORIDE 0.9 % IV BOLUS
500.0000 mL | Freq: Once | INTRAVENOUS | Status: AC
  Administered 2022-10-02: 500 mL via INTRAVENOUS

## 2022-10-02 NOTE — ED Triage Notes (Addendum)
Pt arrived from [facility] for AMS since 6/29 after a fall on 6/29, transported to [facility] in [place] and was released that evening. Staff at the facility reported that pt has not been at baseline since the fall, pt is usually up ambulating, talkative, and has a great appetite. Since 6/29, pt has been laying around, somnolent, decrease appetite and not herself. Staff concern that [facility] may have missed a "bleed" or "other causes" for her AMS the past 3 days. EMS reports VSS, pt alert on arrival, response to questions are inappropriate and pt has to be redirected from pulling at lines.   Pt is NOT on blood thinners Daughter reports pt fell a few weeks ago as well.

## 2022-10-02 NOTE — ED Notes (Signed)
Pt to Ct scanner at this time 

## 2022-10-02 NOTE — ED Notes (Signed)
Family members advised they were leaving for the night shortly, asked them to please let this RN know when they are about to leave so that additional proper fall risk precautions may be implemented as pt has been moving around in bed freely, placing leg over rale, removing monitoring equipments, and pulling at IV lines. Family verbalized understanding, explained that while they are at bedside they can help assist with keeping pt in bed and from removing equipment, but if they left the bedside, pt can be a risk to self and potential for another fall.   Charge RN notified of need for precautions and moving pt to a room at nurses station for safety monitoring once pt's family leaves the bedside

## 2022-10-03 DIAGNOSIS — R410 Disorientation, unspecified: Secondary | ICD-10-CM | POA: Diagnosis not present

## 2022-10-03 MED ORDER — OLANZAPINE 5 MG PO TBDP
2.5000 mg | ORAL_TABLET | Freq: Every day | ORAL | Status: DC
  Administered 2022-10-03: 2.5 mg via ORAL

## 2022-10-03 NOTE — ED Provider Notes (Signed)
[place] EMERGENCY DEPARTMENT AT [facility] Provider Note   CSN: [id number] Arrival date & time: 10/02/22  1930     History  Chief Complaint  Patient presents with   AMS    Paula Andrews is a 77 y.o. female.  HPI     This is a 77 year old female with a history of dementia who presents with altered mental status.  [facility] for increased altered mental status.  She fell on 6/29 and was evaluated at [facility] in [place].  Per EMS report, she was released but facility thinks that she is not acting like herself.  Reportedly not as ambulatory or talkative as normal.  Patient does not provide any history.  Denies any pain.  Poke with the patient's daughter.  Reports fall and evaluation in the hospital on Friday.  Reports that she seems a little more confused but at baseline has pretty bad dementia and has had dementia for some time.  Home Medications Prior to Admission medications   Medication Sig Start Date End Date Taking? Authorizing Provider  acetaminophen (TYLENOL) 325 MG tablet Take 2 tablets (650 mg total) by mouth 2 (two) times daily as needed for mild pain or moderate pain. 11/28/19   Blane Ohara, MD  alum & mag hydroxide-simeth (MI-ACID) 200-200-20 MG/5ML suspension Take 30 mLs by mouth every 6 (six) hours as needed for indigestion or heartburn.    [provider]  busPIRone (BUSPAR) 5 MG tablet Take 5 mg by mouth 2 (two) times daily. 8am, 5pm 08/04/21   [provider]  famotidine (PEPCID) 20 MG tablet Take 20 mg by mouth every morning. 08/04/21   [provider]  levothyroxine (SYNTHROID) 50 MCG tablet Take 50 mcg by mouth daily at 6 (six) AM. 08/04/21   [provider]  lisinopril (ZESTRIL) 5 MG tablet Take 1 tablet (5 mg total) by mouth daily. Patient taking differently: Take 5 mg by mouth every morning. 11/28/19   Blane Ohara, MD  loperamide (IMODIUM) 2 MG capsule Take 2 mg by mouth as  needed for diarrhea or loose stools (max 8 doses in 24 hours).    [provider]  magnesium hydroxide (MILK OF MAGNESIA) 400 MG/5ML suspension Take 30 mLs by mouth at bedtime as needed (constipation).    [provider]  melatonin 1 MG TABS tablet Take 1 mg by mouth at bedtime.    [provider]  memantine (NAMENDA XR) 21 MG CP24 24 hr capsule Take 1 capsule (21 mg total) by mouth daily. Patient taking differently: Take 21 mg by mouth every morning. 11/28/19   Blane Ohara, MD  metFORMIN (GLUCOPHAGE) 500 MG tablet Take 1 tablet (500 mg total) by mouth daily as needed (for high blood sugars). Patient taking differently: Take 500 mg by mouth 2 (two) times daily with a meal. 11/28/19   Blane Ohara, MD  OLANZapine (ZYPREXA) 2.5 MG tablet Take 2.5 mg by mouth daily at 2 PM. 08/04/21   [provider]  venlafaxine XR (EFFEXOR-XR) 37.5 MG 24 hr capsule Take 37.5 mg by mouth every morning. 08/04/21   [provider]  Vitamin D, Cholecalciferol, 10 MCG (400 UNIT) TABS Take 800 Units by mouth every morning.    [provider]      Allergies    Patient has no known allergies.    Review of Systems   Review of Systems  Unable to perform ROS: Dementia    Physical Exam Updated  Vital Signs BP (!) 158/96   Pulse 81   Temp 99.8 F (37.7 C) (Oral)   Resp 13   Ht 1.626 m (5\' 4" )   Wt 70 kg   SpO2 97%   BMI 26.49 kg/m  Physical Exam Vitals and nursing note reviewed.  Constitutional:      Appearance: She is well-developed.     Comments: Agitated but directable  HENT:     Head: Normocephalic and atraumatic.     Mouth/Throat:     Mouth: Mucous membranes are dry.  Eyes:     Pupils: Pupils are equal, round, and reactive to light.     Comments: Contusion right eye  Cardiovascular:     Rate and Rhythm: Normal rate and regular rhythm.     Heart sounds: Normal heart sounds.  Pulmonary:     Effort: Pulmonary effort is normal. No respiratory  distress.     Breath sounds: No wheezing.  Abdominal:     Palpations: Abdomen is soft.     Tenderness: There is no abdominal tenderness.  Musculoskeletal:     Cervical back: Neck supple.  Skin:    General: Skin is warm and dry.  Neurological:     Mental Status: She is alert.     Comments: Alert, only oriented to self, moves all 4 extremities, follows simple commands     ED Results / Procedures / Treatments   Labs (all labs ordered are listed, but only abnormal results are displayed) Labs Reviewed  CBC WITH DIFFERENTIAL/PLATELET - Abnormal; Notable for the following components:      Result Value   MCH 25.4 (*)    All other components within normal limits  COMPREHENSIVE METABOLIC PANEL - Abnormal; Notable for the following components:   Glucose, Bld 126 (*)    BUN 26 (*)    Creatinine, Ser 1.01 (*)    Total Bilirubin 1.3 (*)    GFR, Estimated 58 (*)    All other components within normal limits  URINALYSIS, ROUTINE W REFLEX MICROSCOPIC - Abnormal; Notable for the following components:   APPearance HAZY (*)    Ketones, ur 20 (*)    Protein, ur 30 (*)    Bacteria, UA RARE (*)    All other components within normal limits    EKG EKG Interpretation Date/Time:  Monday October 02 2022 20:27:07 EDT Ventricular Rate:  81 PR Interval:    QRS Duration:  93 QT Interval:  395 QTC Calculation: 459 R Axis:   61  Text Interpretation: Normal sinus rhythm Confirmed by Ross Marcus ([id number]) on 10/02/2022 10:59:39 PM  Radiology DG Chest Port 1 View  Result Date: 10/02/2022 CLINICAL DATA:  Shortness of EXAM: PORTABLE CHEST 1 VIEW COMPARISON:  09/18/2021 FINDINGS: Borderline cardiomegaly. Aortic atherosclerosis. No focal airspace disease or effusion. No pneumothorax. IMPRESSION: Borderline cardiomegaly. Electronically Signed   By: Jasmine Pang M.D.   On: 10/02/2022 21:35   CT Head Wo Contrast  Result Date: 10/02/2022 CLINICAL DATA:  Headache fever EXAM: CT HEAD WITHOUT CONTRAST  TECHNIQUE: Contiguous axial images were obtained from the base of the skull through the vertex without intravenous contrast. RADIATION DOSE REDUCTION: This exam was performed according to the departmental dose-optimization program which includes automated exposure control, adjustment of the mA and/or kV according to patient size and/or use of iterative reconstruction technique. COMPARISON:  CT brain 03/13/2018 FINDINGS: Brain: Motion degradation. No acute territorial infarction, hemorrhage or intracranial mass. Chronic left occipital infarct. Atrophy. Moderate chronic small vessel ischemic changes of  the white matter. Progressed temporal lobe atrophy with enlargement of the temporal horns compared to prior. Vascular: No hyperdense vessels.  Carotid vascular calcification Skull: Normal. Negative for fracture or focal lesion. Sinuses/Orbits: No acute finding. Other: None IMPRESSION: 1. No CT evidence for acute intracranial abnormality. 2. Atrophy and chronic small vessel ischemic changes of the white matter. Chronic left occipital infarct Electronically Signed   By: Jasmine Pang M.D.   On: 10/02/2022 21:34    Procedures Procedures    Medications Ordered in ED Medications  sodium chloride 0.9 % bolus 500 mL (0 mLs Intravenous Stopped 10/02/22 2251)    ED Course/ Medical Decision Making/ A&P Clinical Course as of 10/03/22 0003  Tue Oct 03, 2022  0003 Spoke to the patient's daughter.  Reports that she fell on Friday.  She has a history of dementia but may be a little more confused than normal.  We discussed her workup.  Patient was given 500 cc of fluid to cover any potential dehydration.  Daughter is agreeable to discharge back to facility. [CH]    Clinical Course User Index [CH] Vipul Cafarelli, Mayer Masker, MD                             Medical Decision Making  This patient presents to the ED for concern of altered mental status, this involves an extensive number of treatment options, and is a complaint  that carries with it a high risk of complications and morbidity.  I considered the following differential and admission for this acute, potentially life threatening condition.  The differential diagnosis includes viral derangements, acute infection, traumatic injury, urinary tract infection  MDM:    This is a 77 year old female who presents with reported increased altered mental status.  She is nontoxic.  Vital signs are largely reassuring.  She is very fidgety in the bed and is only oriented to herself.  Physical exam however is otherwise unremarkable.  She moves all 4 extremities equally.  No apparent focal deficits.  She does have obvious contusion to the right side of the face likely related to recent fall.  Labs here are negative for UTI.  May be slight worsening creatinine and uremia with a BUN of 26.  Do not feel this fully explains her altered mental status but she was given 500 cc of fluid for possible early dehydration.  She does have a history of chronic kidney disease.  EKG and repeat CT scan are unremarkable.  Discussed with the daughter.  Will send back to her living facility.  (Labs, imaging, consults)  Labs: I Ordered, and personally interpreted labs.  The pertinent results include: CBC, CMP, urinalysis  Imaging Studies ordered: I ordered imaging studies including x-ray, CT imaging I independently visualized and interpreted imaging. I agree with the radiologist interpretation  Additional history obtained from daughter over the phone.  External records from outside source obtained and reviewed including prior evaluations  Cardiac Monitoring: The patient was maintained on a cardiac monitor.  If on the cardiac monitor, I personally viewed and interpreted the cardiac monitored which showed an underlying rhythm of: Sinus rhythm  Reevaluation: After the interventions noted above, I reevaluated the patient and found that they have :stayed the same  Social Determinants of Health:   lives in facility  Disposition: Discharge  Co morbidities that complicate the patient evaluation  Past Medical History:  Diagnosis Date   Acid reflux    Agitation due to dementia (  HCC)    Anxiety    Arthritis    Dementia (HCC)    Depression    Diabetes mellitus    Hypertension    Psychosis (HCC)    Seasonal allergies    Thyroid disease      Medicines Meds ordered this encounter  Medications   sodium chloride 0.9 % bolus 500 mL    I have reviewed the patients home medicines and have made adjustments as needed  Problem List / ED Course: Problem List Items Addressed This Visit   None Visit Diagnoses     Delirium    -  Primary                   Final Clinical Impression(s) / ED Diagnoses Final diagnoses:  Delirium    Rx / DC Orders ED Discharge Orders     None         Shon Baton, MD 10/03/22 0009

## 2022-10-03 NOTE — Discharge Instructions (Signed)
She was seen today for increasing confusion.  Her workup today is reassuring including head imaging and basic labs.  Follow-up with primary physician.

## 2022-10-03 NOTE — ED Provider Notes (Incomplete)
[place] EMERGENCY DEPARTMENT AT [facility] Provider Note   CSN: [id number] Arrival date & time: 10/02/22  1930     History {Add pertinent medical, surgical, social history, OB history to HPI:1} Chief Complaint  Patient presents with  . AMS    Paula Andrews is a 77 y.o. female.  HPI     Home Medications Prior to Admission medications   Medication Sig Start Date End Date Taking? Authorizing Provider  acetaminophen (TYLENOL) 325 MG tablet Take 2 tablets (650 mg total) by mouth 2 (two) times daily as needed for mild pain or moderate pain. 11/28/19   Blane Ohara, MD  alum & mag hydroxide-simeth (MI-ACID) 200-200-20 MG/5ML suspension Take 30 mLs by mouth every 6 (six) hours as needed for indigestion or heartburn.    [provider]  busPIRone (BUSPAR) 5 MG tablet Take 5 mg by mouth 2 (two) times daily. 8am, 5pm 08/04/21   [provider]  famotidine (PEPCID) 20 MG tablet Take 20 mg by mouth every morning. 08/04/21   [provider]  levothyroxine (SYNTHROID) 50 MCG tablet Take 50 mcg by mouth daily at 6 (six) AM. 08/04/21   [provider]  lisinopril (ZESTRIL) 5 MG tablet Take 1 tablet (5 mg total) by mouth daily. Patient taking differently: Take 5 mg by mouth every morning. 11/28/19   Blane Ohara, MD  loperamide (IMODIUM) 2 MG capsule Take 2 mg by mouth as needed for diarrhea or loose stools (max 8 doses in 24 hours).    [provider]  magnesium hydroxide (MILK OF MAGNESIA) 400 MG/5ML suspension Take 30 mLs by mouth at bedtime as needed (constipation).    [provider]  melatonin 1 MG TABS tablet Take 1 mg by mouth at bedtime.    [provider]  memantine (NAMENDA XR) 21 MG CP24 24 hr capsule Take 1 capsule (21 mg total) by mouth daily. Patient taking differently: Take 21 mg by mouth every morning. 11/28/19   Blane Ohara, MD  metFORMIN (GLUCOPHAGE) 500 MG tablet Take 1 tablet (500 mg total) by mouth  daily as needed (for high blood sugars). Patient taking differently: Take 500 mg by mouth 2 (two) times daily with a meal. 11/28/19   Blane Ohara, MD  OLANZapine (ZYPREXA) 2.5 MG tablet Take 2.5 mg by mouth daily at 2 PM. 08/04/21   [provider]  venlafaxine XR (EFFEXOR-XR) 37.5 MG 24 hr capsule Take 37.5 mg by mouth every morning. 08/04/21   [provider]  Vitamin D, Cholecalciferol, 10 MCG (400 UNIT) TABS Take 800 Units by mouth every morning.    [provider]      Allergies    Patient has no known allergies.    Review of Systems   Review of Systems  Physical Exam Updated Vital Signs BP (!) 158/96   Pulse 81   Temp 99.8 F (37.7 C) (Oral)   Resp 13   Ht 1.626 m (5\' 4" )   Wt 70 kg   SpO2 97%   BMI 26.49 kg/m  Physical Exam  ED Results / Procedures / Treatments   Labs (all labs ordered are listed, but only abnormal results are displayed) Labs Reviewed  CBC WITH DIFFERENTIAL/PLATELET - Abnormal; Notable for the following components:      Result Value   MCH 25.4 (*)    All other components within normal limits  COMPREHENSIVE METABOLIC PANEL - Abnormal; Notable for the following components:   Glucose, Bld 126 (*)  BUN 26 (*)    Creatinine, Ser 1.01 (*)    Total Bilirubin 1.3 (*)    GFR, Estimated 58 (*)    All other components within normal limits  URINALYSIS, ROUTINE W REFLEX MICROSCOPIC - Abnormal; Notable for the following components:   APPearance HAZY (*)    Ketones, ur 20 (*)    Protein, ur 30 (*)    Bacteria, UA RARE (*)    All other components within normal limits    EKG EKG Interpretation Date/Time:  Monday October 02 2022 20:27:07 EDT Ventricular Rate:  81 PR Interval:    QRS Duration:  93 QT Interval:  395 QTC Calculation: 459 R Axis:   61  Text Interpretation: Normal sinus rhythm Confirmed by Ross Marcus ([id number]) on 10/02/2022 10:59:39 PM  Radiology DG Chest Port 1 View  Result Date: 10/02/2022 CLINICAL DATA:   Shortness of EXAM: PORTABLE CHEST 1 VIEW COMPARISON:  09/18/2021 FINDINGS: Borderline cardiomegaly. Aortic atherosclerosis. No focal airspace disease or effusion. No pneumothorax. IMPRESSION: Borderline cardiomegaly. Electronically Signed   By: Jasmine Pang M.D.   On: 10/02/2022 21:35   CT Head Wo Contrast  Result Date: 10/02/2022 CLINICAL DATA:  Headache fever EXAM: CT HEAD WITHOUT CONTRAST TECHNIQUE: Contiguous axial images were obtained from the base of the skull through the vertex without intravenous contrast. RADIATION DOSE REDUCTION: This exam was performed according to the departmental dose-optimization program which includes automated exposure control, adjustment of the mA and/or kV according to patient size and/or use of iterative reconstruction technique. COMPARISON:  CT brain 03/13/2018 FINDINGS: Brain: Motion degradation. No acute territorial infarction, hemorrhage or intracranial mass. Chronic left occipital infarct. Atrophy. Moderate chronic small vessel ischemic changes of the white matter. Progressed temporal lobe atrophy with enlargement of the temporal horns compared to prior. Vascular: No hyperdense vessels.  Carotid vascular calcification Skull: Normal. Negative for fracture or focal lesion. Sinuses/Orbits: No acute finding. Other: None IMPRESSION: 1. No CT evidence for acute intracranial abnormality. 2. Atrophy and chronic small vessel ischemic changes of the white matter. Chronic left occipital infarct Electronically Signed   By: Jasmine Pang M.D.   On: 10/02/2022 21:34    Procedures Procedures  {Document cardiac monitor, telemetry assessment procedure when appropriate:1}  Medications Ordered in ED Medications  sodium chloride 0.9 % bolus 500 mL (0 mLs Intravenous Stopped 10/02/22 2251)    ED Course/ Medical Decision Making/ A&P   {   Click here for ABCD2, HEART and other calculatorsREFRESH Note before signing :1}                          Medical Decision  Making  ***  {Document critical care time when appropriate:1} {Document review of labs and clinical decision tools ie heart score, Chads2Vasc2 etc:1}  {Document your independent review of radiology images, and any outside records:1} {Document your discussion with family members, caretakers, and with consultants:1} {Document social determinants of health affecting pt's care:1} {Document your decision making why or why not admission, treatments were needed:1} Final Clinical Impression(s) / ED Diagnoses Final diagnoses:  Delirium    Rx / DC Orders ED Discharge Orders     None

## 2022-10-08 ENCOUNTER — Other Ambulatory Visit: Payer: Self-pay

## 2022-10-08 ENCOUNTER — Encounter (HOSPITAL_COMMUNITY): Payer: Self-pay | Admitting: Emergency Medicine

## 2022-10-08 ENCOUNTER — Emergency Department (HOSPITAL_COMMUNITY)
Admission: EM | Admit: 2022-10-08 | Discharge: 2022-10-08 | Disposition: A | Discharge status: Home / Self Care | Payer: Medicare Other | Attending: Emergency Medicine | Admitting: Emergency Medicine

## 2022-10-08 ENCOUNTER — Emergency Department (HOSPITAL_COMMUNITY): Payer: Medicare Other

## 2022-10-08 DIAGNOSIS — F0390 Unspecified dementia without behavioral disturbance: Secondary | ICD-10-CM | POA: Diagnosis not present

## 2022-10-08 DIAGNOSIS — R531 Weakness: Secondary | ICD-10-CM | POA: Diagnosis present

## 2022-10-08 DIAGNOSIS — E875 Hyperkalemia: Secondary | ICD-10-CM | POA: Insufficient documentation

## 2022-10-08 LAB — CBC WITH DIFFERENTIAL/PLATELET
Abs Immature Granulocytes: 0.04 10*3/uL (ref 0.00–0.07)
Basophils Absolute: 0.1 10*3/uL (ref 0.0–0.1)
Basophils Relative: 1 %
Eosinophils Absolute: 0.1 10*3/uL (ref 0.0–0.5)
Eosinophils Relative: 1 %
HCT: 44.5 % (ref 36.0–46.0)
Hemoglobin: 12.9 g/dL (ref 12.0–15.0)
Immature Granulocytes: 0 %
Lymphocytes Relative: 14 %
Lymphs Abs: 1.3 10*3/uL (ref 0.7–4.0)
MCH: 24.9 pg — ABNORMAL LOW (ref 26.0–34.0)
MCHC: 29 g/dL — ABNORMAL LOW (ref 30.0–36.0)
MCV: 85.9 fL (ref 80.0–100.0)
Monocytes Absolute: 0.6 10*3/uL (ref 0.1–1.0)
Monocytes Relative: 6 %
Neutro Abs: 7.5 10*3/uL (ref 1.7–7.7)
Neutrophils Relative %: 78 %
Platelets: 311 10*3/uL (ref 150–400)
RBC: 5.18 MIL/uL — ABNORMAL HIGH (ref 3.87–5.11)
RDW: 15.4 % (ref 11.5–15.5)
WBC: 9.6 10*3/uL (ref 4.0–10.5)
nRBC: 0 % (ref 0.0–0.2)

## 2022-10-08 LAB — BLOOD GAS, VENOUS
Acid-Base Excess: 4.5 mmol/L — ABNORMAL HIGH (ref 0.0–2.0)
Bicarbonate: 30.4 mmol/L — ABNORMAL HIGH (ref 20.0–28.0)
Drawn by: 27160
O2 Saturation: 35.2 %
Patient temperature: 36.8
pCO2, Ven: 49 mmHg (ref 44–60)
pH, Ven: 7.4 (ref 7.25–7.43)
pO2, Ven: <31 mmHg — CL (ref 32–45)

## 2022-10-08 LAB — COMPREHENSIVE METABOLIC PANEL
ALT: 17 U/L (ref 0–44)
AST: 21 U/L (ref 15–41)
Albumin: 3.6 g/dL (ref 3.5–5.0)
Alkaline Phosphatase: 85 U/L (ref 38–126)
Anion gap: 14 (ref 5–15)
BUN: 34 mg/dL — ABNORMAL HIGH (ref 8–23)
CO2: 22 mmol/L (ref 22–32)
Calcium: 9.2 mg/dL (ref 8.9–10.3)
Chloride: 109 mmol/L (ref 98–111)
Creatinine, Ser: 1.07 mg/dL — ABNORMAL HIGH (ref 0.44–1.00)
GFR, Estimated: 54 mL/min — ABNORMAL LOW (ref >60)
Glucose, Bld: 120 mg/dL — ABNORMAL HIGH (ref 70–99)
Potassium: 5.5 mmol/L — ABNORMAL HIGH (ref 3.5–5.1)
Sodium: 145 mmol/L (ref 135–145)
Total Bilirubin: 1.6 mg/dL — ABNORMAL HIGH (ref 0.3–1.2)
Total Protein: 7.1 g/dL (ref 6.5–8.1)

## 2022-10-08 LAB — URINALYSIS, ROUTINE W REFLEX MICROSCOPIC
Bacteria, UA: NONE SEEN
Bilirubin Urine: NEGATIVE
Glucose, UA: NEGATIVE mg/dL
Ketones, ur: 20 mg/dL — AB
Nitrite: NEGATIVE
Protein, ur: 30 mg/dL — AB
Specific Gravity, Urine: 1.02 (ref 1.005–1.030)
pH: 5 (ref 5.0–8.0)

## 2022-10-08 LAB — BASIC METABOLIC PANEL
Anion gap: 14 (ref 5–15)
BUN: 36 mg/dL — ABNORMAL HIGH (ref 8–23)
CO2: 24 mmol/L (ref 22–32)
Calcium: 9.5 mg/dL (ref 8.9–10.3)
Chloride: 109 mmol/L (ref 98–111)
Creatinine, Ser: 1.1 mg/dL — ABNORMAL HIGH (ref 0.44–1.00)
GFR, Estimated: 52 mL/min — ABNORMAL LOW (ref >60)
Glucose, Bld: 119 mg/dL — ABNORMAL HIGH (ref 70–99)
Potassium: 3.7 mmol/L (ref 3.5–5.1)
Sodium: 147 mmol/L — ABNORMAL HIGH (ref 135–145)

## 2022-10-08 MED ORDER — SODIUM ZIRCONIUM CYCLOSILICATE 5 G PO PACK
10.0000 g | PACK | Freq: Once | ORAL | Status: AC
  Administered 2022-10-08: 10 g via ORAL
  Filled 2022-10-08: qty 2

## 2022-10-08 MED ORDER — SODIUM CHLORIDE 0.9 % IV BOLUS
500.0000 mL | Freq: Once | INTRAVENOUS | Status: AC
  Administered 2022-10-08: 500 mL via INTRAVENOUS

## 2022-10-08 NOTE — Discharge Instructions (Signed)
You appear to be mildly dehydrated, make sure that you are taking lots of fluids, your blood work was unremarkable, there is no urinary infection, please see your family doctor within 2 or 3 days for recheck

## 2022-10-08 NOTE — ED Triage Notes (Signed)
Pt arrived via [facility] from [facility] for reports of decreased PO intake and dehydration.

## 2022-10-08 NOTE — ED Provider Notes (Signed)
[place] EMERGENCY DEPARTMENT AT [facility] Provider Note   CSN: [id number] Arrival date & time: 10/08/22  1259     History  Chief Complaint  Patient presents with   Weakness    Paula Andrews is a 77 y.o. female.  Presents from [facility].  She has history of PE and dementia not on baseline oxygen.  Brought to the ER for generalized weakness and decreased PO intake. No fever, cough, vomiting or pain. Pt cannot give history due to dementia.  With her daughter Docia Chuck on the phone.  She states patient has had 4 days of generalized weakness and decreased oral intake, she states in the past when she has been like this her carbadox levels were elevated.  She ihas not had cough, fevers, vomiting or other symptoms.  Not been fully resolved that she had a fall on 6/29.  At that time was evaluated at [facility] and had CT scan.  Has not been able to ambulate independently since that time, was seen here in the ED on 7/1 and had repeat head CT but still is not ambulating.   Weakness      Home Medications Prior to Admission medications   Medication Sig Start Date End Date Taking? Authorizing Provider  acetaminophen (TYLENOL) 325 MG tablet Take 2 tablets (650 mg total) by mouth 2 (two) times daily as needed for mild pain or moderate pain. 11/28/19   Blane Ohara, MD  alum & mag hydroxide-simeth (MI-ACID) 200-200-20 MG/5ML suspension Take 30 mLs by mouth every 6 (six) hours as needed for indigestion or heartburn.    [provider]  busPIRone (BUSPAR) 5 MG tablet Take 5 mg by mouth 2 (two) times daily. 8am, 5pm 08/04/21   [provider]  famotidine (PEPCID) 20 MG tablet Take 20 mg by mouth every morning. 08/04/21   [provider]  levothyroxine (SYNTHROID) 50 MCG tablet Take 50 mcg by mouth daily at 6 (six) AM. 08/04/21   [provider]  lisinopril (ZESTRIL) 5 MG tablet Take 1 tablet (5 mg total) by mouth daily. Patient taking  differently: Take 5 mg by mouth every morning. 11/28/19   Blane Ohara, MD  loperamide (IMODIUM) 2 MG capsule Take 2 mg by mouth as needed for diarrhea or loose stools (max 8 doses in 24 hours).    [provider]  magnesium hydroxide (MILK OF MAGNESIA) 400 MG/5ML suspension Take 30 mLs by mouth at bedtime as needed (constipation).    [provider]  melatonin 1 MG TABS tablet Take 1 mg by mouth at bedtime.    [provider]  memantine (NAMENDA XR) 21 MG CP24 24 hr capsule Take 1 capsule (21 mg total) by mouth daily. Patient taking differently: Take 21 mg by mouth every morning. 11/28/19   Blane Ohara, MD  metFORMIN (GLUCOPHAGE) 500 MG tablet Take 1 tablet (500 mg total) by mouth daily as needed (for high blood sugars). Patient taking differently: Take 500 mg by mouth 2 (two) times daily with a meal. 11/28/19   Blane Ohara, MD  OLANZapine (ZYPREXA) 2.5 MG tablet Take 2.5 mg by mouth daily at 2 PM. 08/04/21   [provider]  venlafaxine XR (EFFEXOR-XR) 37.5 MG 24 hr capsule Take 37.5 mg by mouth every morning. 08/04/21   [provider]  Vitamin D, Cholecalciferol, 10 MCG (400 UNIT) TABS Take 800 Units by mouth every morning.    [provider]      Allergies  Patient has no known allergies.    Review of Systems   Review of Systems  Neurological:  Positive for weakness.    Physical Exam Updated Vital Signs BP (!) 134/91   Pulse 92   Temp 98.1 F (36.7 C) (Axillary)   Resp 18   Ht 5\' 4"  (1.626 m)   Wt 69.9 kg   SpO2 93%   BMI 26.43 kg/m  Physical Exam Vitals and nursing note reviewed.  Constitutional:      General: She is not in acute distress.    Appearance: She is well-developed.  HENT:     Head: Normocephalic and atraumatic.     Mouth/Throat:     Mouth: Mucous membranes are moist.  Eyes:     Conjunctiva/sclera: Conjunctivae normal.  Cardiovascular:     Rate and Rhythm: Normal rate and regular rhythm.     Heart  sounds: No murmur heard. Pulmonary:     Effort: Pulmonary effort is normal. No respiratory distress.     Breath sounds: Normal breath sounds. No wheezing, rhonchi or rales.  Abdominal:     Palpations: Abdomen is soft.     Tenderness: There is no abdominal tenderness. There is no guarding.  Musculoskeletal:        General: No swelling. Normal range of motion.     Cervical back: Neck supple.     Right lower leg: No edema.     Left lower leg: No edema.  Skin:    General: Skin is warm and dry.     Capillary Refill: Capillary refill takes less than 2 seconds.  Neurological:     General: No focal deficit present.     Mental Status: She is alert. Mental status is at baseline.     Comments: Oriented To self only  Psychiatric:        Mood and Affect: Mood normal.     ED Results / Procedures / Treatments   Labs (all labs ordered are listed, but only abnormal results are displayed) Labs Reviewed  COMPREHENSIVE METABOLIC PANEL - Abnormal; Notable for the following components:      Result Value   Potassium 5.5 (*)    Glucose, Bld 120 (*)    BUN 34 (*)    Creatinine, Ser 1.07 (*)    Total Bilirubin 1.6 (*)    GFR, Estimated 54 (*)    All other components within normal limits  CBC WITH DIFFERENTIAL/PLATELET - Abnormal; Notable for the following components:   RBC 5.18 (*)    MCH 24.9 (*)    MCHC 29.0 (*)    All other components within normal limits  URINALYSIS, ROUTINE W REFLEX MICROSCOPIC - Abnormal; Notable for the following components:   Hgb urine dipstick SMALL (*)    Ketones, ur 20 (*)    Protein, ur 30 (*)    Leukocytes,Ua SMALL (*)    All other components within normal limits  BLOOD GAS, VENOUS - Abnormal; Notable for the following components:   pO2, Ven <31 (*)    Bicarbonate 30.4 (*)    Acid-Base Excess 4.5 (*)    All other components within normal limits  BASIC METABOLIC PANEL - Abnormal; Notable for the following components:   Sodium 147 (*)    Glucose, Bld 119 (*)     BUN 36 (*)    Creatinine, Ser 1.10 (*)    GFR, Estimated 52 (*)    All other components within normal limits    EKG EKG Interpretation Date/Time:  Sunday  October 08 2022 14:01:58 EDT Ventricular Rate:  85 PR Interval:  144 QRS Duration:  77 QT Interval:  386 QTC Calculation: 459 R Axis:   62  Text Interpretation: Sinus or ectopic atrial rhythm since last tracing no significant change Confirmed by Eber Hong ([id number]) on 10/08/2022 4:29:38 PM  Radiology DG Chest Portable 1 View  Result Date: 10/08/2022 CLINICAL DATA:  Weakness. EXAM: PORTABLE CHEST 1 VIEW COMPARISON:  10/02/2022 and older exams. FINDINGS: Cardiac silhouette is normal in size. Normal mediastinal and hilar contours. Clear lungs.  No pleural effusion or pneumothorax. Skeletal structures are demineralized, grossly intact. IMPRESSION: No active disease. Electronically Signed   By: Amie Portland M.D.   On: 10/08/2022 14:57    Procedures Procedures    Medications Ordered in ED Medications  sodium chloride 0.9 % bolus 500 mL (500 mLs Intravenous Bolus 10/08/22 1532)  sodium zirconium cyclosilicate (LOKELMA) packet 10 g (10 g Oral Given 10/08/22 1622)    ED Course/ Medical Decision Making/ A&P                             Medical Decision Making This patient presents to the ED for concern of generalized weakness ongoing for over a week, this involves an extensive number of treatment options, and is a complaint that carries with it a high risk of complications and morbidity.  The differential diagnosis includes dehydration, electrolyte abnormality, intracranial hemorrhage, CVA, metabolic encephalopathy, UTI, worsening dementia, other   Co morbidities that complicate the patient evaluation :   Dementia   Additional history obtained:  Additional history obtained from EMR External records from outside source obtained and reviewed including prior notes and labs   Lab Tests:  I Ordered, and personally interpreted labs.   The pertinent results include: CBC is reassuring without leukocytosis or anemia, CMP does have elevated potassium at 5.5, possibly due to some mild dehydration versus malaise as well lab draw, repeat shows normal, no EKG changes Urinalysis is negative, VBG shows no hypercapnia, PaO2 is decreased at 31 but on pulse oximeter patient is saturating 93 to 96 on room air  Imaging Studies ordered:  I ordered imaging studies including chest x-ray I independently visualized and interpreted imaging which showed nopulmonary edema or infiltrate I agree with the radiologist interpretation   Cardiac Monitoring: / EKG:  The patient was maintained on a cardiac monitor.  I personally viewed and interpreted the cardiac monitored which showed an underlying rhythm of: Sinus rhythm    Problem List / ED Course / Critical interventions / Medication management  Patient here for evaluation of continued generalized weakness, decrease in p.o. intake and decrease in ambulation, appears to be mild dehydrated, is already has had 2 head CTs since her recent fall, no mental status changes, she is not getting worse but just has not gotten any better, I do not feel need for repeat head CT, labs are reassuring, patient is nontoxic appearance, give IV fluids, discussed with family no need for admission but they were concerned, Dr. Hyacinth Meeker also spoke with him, they are reassured with findings and she is going to go back to her memory care unit.  I discussed with them if they would be interested in placement in skilled nursing since she seems to be deteriorating and they are not interested in this.  I have reviewed the patients home medicines and have made adjustments as needed  Amount and/or Complexity of Data Reviewed Labs: ordered. Radiology:  ordered.  Risk Prescription drug management.           Final Clinical Impression(s) / ED Diagnoses Final diagnoses:  Generalized weakness    Rx / DC Orders ED Discharge  Orders     None         Josem Kaufmann 10/08/22 Lynford Citizen, MD 10/08/22 2102

## 2022-10-16 ENCOUNTER — Emergency Department (HOSPITAL_COMMUNITY)
Admission: EM | Admit: 2022-10-16 | Discharge: 2022-10-17 | Disposition: A | Discharge status: Home / Self Care | Payer: Medicare Other | Attending: Emergency Medicine | Admitting: Emergency Medicine

## 2022-10-16 ENCOUNTER — Encounter (HOSPITAL_COMMUNITY): Payer: Self-pay

## 2022-10-16 ENCOUNTER — Other Ambulatory Visit: Payer: Self-pay

## 2022-10-16 ENCOUNTER — Emergency Department (HOSPITAL_COMMUNITY): Payer: Medicare Other

## 2022-10-16 DIAGNOSIS — W19XXXA Unspecified fall, initial encounter: Secondary | ICD-10-CM

## 2022-10-16 DIAGNOSIS — S0990XA Unspecified injury of head, initial encounter: Secondary | ICD-10-CM | POA: Insufficient documentation

## 2022-10-16 DIAGNOSIS — F0390 Unspecified dementia without behavioral disturbance: Secondary | ICD-10-CM | POA: Diagnosis not present

## 2022-10-16 DIAGNOSIS — W1830XA Fall on same level, unspecified, initial encounter: Secondary | ICD-10-CM | POA: Diagnosis not present

## 2022-10-16 NOTE — ED Triage Notes (Addendum)
BIB EMS from [facility] after unwitnessed fall per staff patient was found on floor.  Hx dementia.  No obvious injuries noted.  Patient ambulatory from ems stretcher to ed stretcher.  No blood thinners noted.  Pt is on hospice care and DNR.

## 2022-10-16 NOTE — ED Notes (Signed)
Notified [place] [facility] of patient needing transportation back to [facility]. 

## 2022-10-16 NOTE — ED Notes (Signed)
Pt to CT

## 2022-10-16 NOTE — ED Notes (Addendum)
Report called to [facility], staff reports no transportation until am. Will arrange transport

## 2022-10-16 NOTE — ED Provider Notes (Signed)
[place] EMERGENCY DEPARTMENT AT [facility] Provider Note   CSN: [id number] Arrival date & time: 10/16/22  1947     History  Chief Complaint  Patient presents with   Paula Andrews    Paula Andrews is a 77 y.o. female.  77 year old female presents today from SNF.  She has history of dementia and does not provide any history.  Daughter is at bedside until she had an unwitnessed fall.  Patient denies any complaints.  The history is provided by the patient. No language interpreter was used.       Home Medications Prior to Admission medications   Medication Sig Start Date End Date Taking? Authorizing Provider  acetaminophen (TYLENOL) 325 MG tablet Take 2 tablets (650 mg total) by mouth 2 (two) times daily as needed for mild pain or moderate pain. 11/28/19   Blane Ohara, MD  alum & mag hydroxide-simeth (MI-ACID) 200-200-20 MG/5ML suspension Take 30 mLs by mouth every 6 (six) hours as needed for indigestion or heartburn.    [provider]  busPIRone (BUSPAR) 5 MG tablet Take 5 mg by mouth 2 (two) times daily. 8am, 5pm 08/04/21   [provider]  famotidine (PEPCID) 20 MG tablet Take 20 mg by mouth every morning. 08/04/21   [provider]  levothyroxine (SYNTHROID) 50 MCG tablet Take 50 mcg by mouth daily at 6 (six) AM. 08/04/21   [provider]  lisinopril (ZESTRIL) 5 MG tablet Take 1 tablet (5 mg total) by mouth daily. Patient taking differently: Take 5 mg by mouth every morning. 11/28/19   Blane Ohara, MD  loperamide (IMODIUM) 2 MG capsule Take 2 mg by mouth as needed for diarrhea or loose stools (max 8 doses in 24 hours).    [provider]  magnesium hydroxide (MILK OF MAGNESIA) 400 MG/5ML suspension Take 30 mLs by mouth at bedtime as needed (constipation).    [provider]  melatonin 1 MG TABS tablet Take 1 mg by mouth at bedtime.    [provider]  memantine (NAMENDA XR) 21 MG CP24 24 hr capsule Take 1 capsule  (21 mg total) by mouth daily. Patient taking differently: Take 21 mg by mouth every morning. 11/28/19   Blane Ohara, MD  metFORMIN (GLUCOPHAGE) 500 MG tablet Take 1 tablet (500 mg total) by mouth daily as needed (for high blood sugars). Patient taking differently: Take 500 mg by mouth 2 (two) times daily with a meal. 11/28/19   Blane Ohara, MD  OLANZapine (ZYPREXA) 2.5 MG tablet Take 2.5 mg by mouth daily at 2 PM. 08/04/21   [provider]  venlafaxine XR (EFFEXOR-XR) 37.5 MG 24 hr capsule Take 37.5 mg by mouth every morning. 08/04/21   [provider]  Vitamin D, Cholecalciferol, 10 MCG (400 UNIT) TABS Take 800 Units by mouth every morning.    [provider]      Allergies    Patient has no known allergies.    Review of Systems   Review of Systems  Unable to perform ROS: Dementia    Physical Exam Updated Vital Signs BP 139/61   Pulse 78   Temp 98 F (36.7 C)   Resp 18   SpO2 95%  Physical Exam Vitals and nursing note reviewed.  Constitutional:      General: She is not in acute distress.    Appearance: Normal appearance. She is not ill-appearing.  HENT:     Head: Normocephalic and atraumatic.     Nose: Nose  normal.  Eyes:     Conjunctiva/sclera: Conjunctivae normal.  Pulmonary:     Effort: Pulmonary effort is normal. No respiratory distress.  Musculoskeletal:        General: No deformity. Normal range of motion.     Cervical back: Normal range of motion.     Comments: Full range of motion bilateral upper and lower extremities  Skin:    Findings: No rash.  Neurological:     Mental Status: She is alert.     ED Results / Procedures / Treatments   Labs (all labs ordered are listed, but only abnormal results are displayed) Labs Reviewed - No data to display  EKG None  Radiology No results found.  Procedures Procedures    Medications Ordered in ED Medications - No data to display  ED Course/ Medical Decision Making/  A&P Clinical Course as of 10/16/22 2059  Mon Oct 16, 2022  [id number] 77 year old female brought in by ambulance from [facility] after an unwitnessed fall.  No gross signs of injury.  Anticipate will be able to return back to her facility. [MB]    Clinical Course User Index [MB] Terrilee Files, MD                             Medical Decision Making Amount and/or Complexity of Data Reviewed Radiology: ordered.   77 year old female presents today for concern of a fall.  She presents from SNF.  This was an unwitnessed fall.  Daughter is at bedside.  Patient appears to be at baseline.  Concern for head injury.  Will order CT head and cervical spine.  CT head and cervical spine without acute findings.  Findings discussed with daughter at bedside as well as mother and daughter over the phone.  They are in agreement.  Patient is appropriate for discharge back to facility.  Exam otherwise reassuring.  Discharged in stable condition.   Final Clinical Impression(s) / ED Diagnoses Final diagnoses:  Injury of head, initial encounter  Fall, initial encounter    Rx / DC Orders ED Discharge Orders     None         Marita Kansas, PA-C 10/16/22 2238    Terrilee Files, MD 10/17/22 [phone]

## 2022-10-16 NOTE — Discharge Instructions (Signed)
CT scan of the head and neck without any concerning findings.  Exam otherwise reassuring.  Return to the emergency department for any concerning symptoms otherwise follow-up with your primary care provider.

## 2022-10-16 NOTE — ED Notes (Signed)
Attempted to call daughter to ask if she would like to transport patient to [facility]

## 2023-02-08 ENCOUNTER — Other Ambulatory Visit: Payer: Self-pay

## 2023-02-08 ENCOUNTER — Emergency Department (HOSPITAL_COMMUNITY)

## 2023-02-08 ENCOUNTER — Emergency Department (HOSPITAL_COMMUNITY)
Admission: EM | Admit: 2023-02-08 | Discharge: 2023-02-08 | Disposition: A | Discharge status: Skilled Nursing Facility | Attending: Emergency Medicine | Admitting: Emergency Medicine

## 2023-02-08 ENCOUNTER — Encounter (HOSPITAL_COMMUNITY): Payer: Self-pay

## 2023-02-08 DIAGNOSIS — S0990XA Unspecified injury of head, initial encounter: Secondary | ICD-10-CM | POA: Diagnosis present

## 2023-02-08 DIAGNOSIS — F0390 Unspecified dementia without behavioral disturbance: Secondary | ICD-10-CM | POA: Insufficient documentation

## 2023-02-08 DIAGNOSIS — J449 Chronic obstructive pulmonary disease, unspecified: Secondary | ICD-10-CM | POA: Diagnosis not present

## 2023-02-08 DIAGNOSIS — Z79899 Other long term (current) drug therapy: Secondary | ICD-10-CM | POA: Insufficient documentation

## 2023-02-08 DIAGNOSIS — W1811XA Fall from or off toilet without subsequent striking against object, initial encounter: Secondary | ICD-10-CM | POA: Diagnosis not present

## 2023-02-08 DIAGNOSIS — Z7984 Long term (current) use of oral hypoglycemic drugs: Secondary | ICD-10-CM | POA: Insufficient documentation

## 2023-02-08 DIAGNOSIS — E119 Type 2 diabetes mellitus without complications: Secondary | ICD-10-CM | POA: Diagnosis not present

## 2023-02-08 DIAGNOSIS — I10 Essential (primary) hypertension: Secondary | ICD-10-CM | POA: Diagnosis not present

## 2023-02-08 DIAGNOSIS — W19XXXA Unspecified fall, initial encounter: Secondary | ICD-10-CM

## 2023-02-08 NOTE — ED Notes (Signed)
Patient transported to CT 

## 2023-02-08 NOTE — ED Provider Notes (Signed)
EMERGENCY DEPARTMENT AT [facility] Provider Note   CSN: [id number] Arrival date & time: 02/08/23  1120     History  Chief Complaint  Patient presents with   Marletta Lor    Paula Andrews is a 77 y.o. female.  77 year old female brought in by EMS from facility.  History of dementia, patient was sitting on the toilet when the hospice nurse stepped away and the patient fell off of the commode.  Patient was found laying on the floor, bleeding from back of head. Not on thinners. In c-collar, unable to provide any oriented answers to questions.        Home Medications Prior to Admission medications   Medication Sig Start Date End Date Taking? Authorizing Provider  acetaminophen (TYLENOL) 325 MG tablet Take 2 tablets (650 mg total) by mouth 2 (two) times daily as needed for mild pain or moderate pain. 11/28/19   Blane Ohara, MD  alum & mag hydroxide-simeth (MI-ACID) 200-200-20 MG/5ML suspension Take 30 mLs by mouth every 6 (six) hours as needed for indigestion or heartburn.    [provider]  busPIRone (BUSPAR) 5 MG tablet Take 5 mg by mouth 2 (two) times daily. 8am, 5pm 08/04/21   [provider]  famotidine (PEPCID) 20 MG tablet Take 20 mg by mouth every morning. 08/04/21   [provider]  levothyroxine (SYNTHROID) 50 MCG tablet Take 50 mcg by mouth daily at 6 (six) AM. 08/04/21   [provider]  lisinopril (ZESTRIL) 5 MG tablet Take 1 tablet (5 mg total) by mouth daily. Patient taking differently: Take 5 mg by mouth every morning. 11/28/19   Blane Ohara, MD  loperamide (IMODIUM) 2 MG capsule Take 2 mg by mouth as needed for diarrhea or loose stools (max 8 doses in 24 hours).    [provider]  magnesium hydroxide (MILK OF MAGNESIA) 400 MG/5ML suspension Take 30 mLs by mouth at bedtime as needed (constipation).    [provider]  melatonin 1 MG TABS tablet Take 1 mg by mouth at bedtime.    [provider]   memantine (NAMENDA XR) 21 MG CP24 24 hr capsule Take 1 capsule (21 mg total) by mouth daily. Patient taking differently: Take 21 mg by mouth every morning. 11/28/19   Blane Ohara, MD  metFORMIN (GLUCOPHAGE) 500 MG tablet Take 1 tablet (500 mg total) by mouth daily as needed (for high blood sugars). Patient taking differently: Take 500 mg by mouth 2 (two) times daily with a meal. 11/28/19   Blane Ohara, MD  OLANZapine (ZYPREXA) 2.5 MG tablet Take 2.5 mg by mouth daily at 2 PM. 08/04/21   [provider]  venlafaxine XR (EFFEXOR-XR) 37.5 MG 24 hr capsule Take 37.5 mg by mouth every morning. 08/04/21   [provider]  Vitamin D, Cholecalciferol, 10 MCG (400 UNIT) TABS Take 800 Units by mouth every morning.    [provider]      Allergies    Patient has no known allergies.    Review of Systems   Review of Systems Level 5 caveat for dementia  Physical Exam Updated Vital Signs BP (!) 145/54   Pulse 66   Ht 5\' 4"  (1.626 m)   Wt 69.9 kg   SpO2 94%   BMI 26.45 kg/m  Physical Exam Vitals and nursing note reviewed.  Constitutional:      General: She is not in acute distress.    Appearance: She is well-developed. She is not  diaphoretic.     Interventions: Cervical collar in place.  HENT:     Head: Normocephalic.     Comments: Blood to back of head, no ongoing bleeding, source not initially identified  Eyes:     Comments: Refuses to open eyes, holds eyes tightly shut  Cardiovascular:     Rate and Rhythm: Normal rate and regular rhythm.  Pulmonary:     Effort: Pulmonary effort is normal.     Breath sounds: Normal breath sounds.  Abdominal:     Palpations: Abdomen is soft.     Tenderness: There is no abdominal tenderness.  Musculoskeletal:        General: No swelling, tenderness or deformity.  Skin:    General: Skin is warm and dry.     Findings: No erythema or rash.  Neurological:     Mental Status: She is alert.  Psychiatric:        Behavior:  Behavior normal.     ED Results / Procedures / Treatments   Labs (all labs ordered are listed, but only abnormal results are displayed) Labs Reviewed - No data to display  EKG None  Radiology CT Head Wo Contrast  Result Date: 02/08/2023 CLINICAL DATA:  Unwitnessed fall from toilet, head injury, pain EXAM: CT HEAD WITHOUT CONTRAST CT CERVICAL SPINE WITHOUT CONTRAST TECHNIQUE: Multidetector CT imaging of the head and cervical spine was performed following the standard protocol without intravenous contrast. Multiplanar CT image reconstructions of the cervical spine were also generated. RADIATION DOSE REDUCTION: This exam was performed according to the departmental dose-optimization program which includes automated exposure control, adjustment of the mA and/or kV according to patient size and/or use of iterative reconstruction technique. COMPARISON:  10/16/2022 FINDINGS: CT HEAD FINDINGS Brain: No evidence of acute infarction, hemorrhage, hydrocephalus, extra-axial collection or mass lesion/mass effect. Extensive periventricular and deep white matter hypodensity. Vascular: No hyperdense vessel or unexpected calcification. Skull: Normal. Negative for fracture or focal lesion. Sinuses/Orbits: No acute finding. Other: Soft tissue contusion of the left occiput (series 2, image 26). CT CERVICAL SPINE FINDINGS Alignment: Degenerative straightening of the normal cervical lordosis Skull base and vertebrae: No acute fracture. No primary bone lesion or focal pathologic process. Soft tissues and spinal canal: No prevertebral fluid or swelling. No visible canal hematoma. Disc levels: Focally mild to moderate disc space height loss and osteophytosis of the lower cervical spine. Upper chest: Negative. Other: None. IMPRESSION: 1. No acute intracranial pathology. Advanced small-vessel white matter disease. 2. Soft tissue contusion of the left occiput. 3. No fracture or static subluxation of the cervical spine. 4. Focally  mild to moderate disc degenerative disease of the lower cervical spine. Electronically Signed   By: Jearld Lesch M.D.   On: 02/08/2023 14:16   CT Cervical Spine Wo Contrast  Result Date: 02/08/2023 CLINICAL DATA:  Unwitnessed fall from toilet, head injury, pain EXAM: CT HEAD WITHOUT CONTRAST CT CERVICAL SPINE WITHOUT CONTRAST TECHNIQUE: Multidetector CT imaging of the head and cervical spine was performed following the standard protocol without intravenous contrast. Multiplanar CT image reconstructions of the cervical spine were also generated. RADIATION DOSE REDUCTION: This exam was performed according to the departmental dose-optimization program which includes automated exposure control, adjustment of the mA and/or kV according to patient size and/or use of iterative reconstruction technique. COMPARISON:  10/16/2022 FINDINGS: CT HEAD FINDINGS Brain: No evidence of acute infarction, hemorrhage, hydrocephalus, extra-axial collection or mass lesion/mass effect. Extensive periventricular and deep white matter hypodensity. Vascular: No hyperdense vessel or unexpected calcification.  Skull: Normal. Negative for fracture or focal lesion. Sinuses/Orbits: No acute finding. Other: Soft tissue contusion of the left occiput (series 2, image 26). CT CERVICAL SPINE FINDINGS Alignment: Degenerative straightening of the normal cervical lordosis Skull base and vertebrae: No acute fracture. No primary bone lesion or focal pathologic process. Soft tissues and spinal canal: No prevertebral fluid or swelling. No visible canal hematoma. Disc levels: Focally mild to moderate disc space height loss and osteophytosis of the lower cervical spine. Upper chest: Negative. Other: None. IMPRESSION: 1. No acute intracranial pathology. Advanced small-vessel white matter disease. 2. Soft tissue contusion of the left occiput. 3. No fracture or static subluxation of the cervical spine. 4. Focally mild to moderate disc degenerative disease of  the lower cervical spine. Electronically Signed   By: Jearld Lesch M.D.   On: 02/08/2023 14:16    Procedures Procedures    Medications Ordered in ED Medications - No data to display  ED Course/ Medical Decision Making/ A&P                                 Medical Decision Making Amount and/or Complexity of Data Reviewed Radiology: ordered.   This patient presents to the ED for concern of injuries from a fall, this involves an extensive number of treatment options, and is a complaint that carries with it a high risk of complications and morbidity.  The differential diagnosis includes intracranial injury, c-spine injury   Co morbidities that complicate the patient evaluation  HTN, DM, COPD, PEHLD   Additional history obtained:  External records from outside source obtained and reviewed including EMS who contributes to history as above   Imaging Studies ordered:  I ordered imaging studies including CT head, ct-c spine  I independently visualized and interpreted imaging which showed no acute intracranial findings I agree with the radiologist interpretation  Consultations Obtained:  I requested consultation with the ER attending, Dr. Hyacinth Meeker,  and discussed lab and imaging findings as well as pertinent plan - they recommend: agrees with plan of care   Problem List / ED Course / Critical interventions / Medication management  77 year old female brought in by EMS from facility after fall with concern for injury to the head.  She is found to have blood to the back of the scalp/occipital area without identifiable wound present, no ongoing bleeding.  CT of the head and C-spine are negative for acute injury.  Patient is ambulatory without difficulty.  She is discharged back to facility. I have reviewed the patients home medicines and have made adjustments as needed   Social Determinants of Health:  Lives at facility    Test / Admission - Considered:  Stable for dc back to  facility          Final Clinical Impression(s) / ED Diagnoses Final diagnoses:  Fall, initial encounter    Rx / DC Orders ED Discharge Orders     None         Alden Hipp 02/08/23 1516    Eber Hong, MD 02/09/23 [phone]

## 2023-02-08 NOTE — ED Triage Notes (Addendum)
Pt was [facility]. EMS states she was sitting on the toilet and the hospice nurse stepped away and came back and pt was laying in the floor. EMS states pt has a knot on the back side of head left side. No LOC, pt does have dementia. EMS states she has blood to the right side of the head but no injury to the right side. PT is a DNR, no blood thinners

## 2023-02-08 NOTE — ED Notes (Signed)
Family updated as to patient's status.

## 2023-02-08 NOTE — Discharge Instructions (Signed)
Recheck with your primary care as needed. Return to ER for worsening or concerning symptoms.

## 2023-02-08 NOTE — ED Notes (Signed)
Pt brief changed. Linens changed.

## 2023-06-14 ENCOUNTER — Encounter (HOSPITAL_COMMUNITY): Payer: Self-pay

## 2023-06-14 ENCOUNTER — Emergency Department (HOSPITAL_COMMUNITY)

## 2023-06-14 ENCOUNTER — Inpatient Hospital Stay (HOSPITAL_COMMUNITY)
Admission: EM | Admit: 2023-06-14 | Discharge: 2023-06-19 | DRG: 522 | Disposition: A | Discharge status: Skilled Nursing Facility | Source: Skilled Nursing Facility | Attending: Family Medicine | Admitting: Family Medicine

## 2023-06-14 DIAGNOSIS — E039 Hypothyroidism, unspecified: Secondary | ICD-10-CM | POA: Diagnosis not present

## 2023-06-14 DIAGNOSIS — F0393 Unspecified dementia, unspecified severity, with mood disturbance: Secondary | ICD-10-CM | POA: Diagnosis present

## 2023-06-14 DIAGNOSIS — W19XXXA Unspecified fall, initial encounter: Secondary | ICD-10-CM | POA: Diagnosis present

## 2023-06-14 DIAGNOSIS — M1990 Unspecified osteoarthritis, unspecified site: Secondary | ICD-10-CM | POA: Diagnosis present

## 2023-06-14 DIAGNOSIS — E876 Hypokalemia: Secondary | ICD-10-CM | POA: Diagnosis not present

## 2023-06-14 DIAGNOSIS — Z7984 Long term (current) use of oral hypoglycemic drugs: Secondary | ICD-10-CM | POA: Diagnosis not present

## 2023-06-14 DIAGNOSIS — I10 Essential (primary) hypertension: Secondary | ICD-10-CM | POA: Insufficient documentation

## 2023-06-14 DIAGNOSIS — F419 Anxiety disorder, unspecified: Secondary | ICD-10-CM

## 2023-06-14 DIAGNOSIS — Y92099 Unspecified place in other non-institutional residence as the place of occurrence of the external cause: Secondary | ICD-10-CM | POA: Diagnosis not present

## 2023-06-14 DIAGNOSIS — D72829 Elevated white blood cell count, unspecified: Secondary | ICD-10-CM | POA: Diagnosis present

## 2023-06-14 DIAGNOSIS — J449 Chronic obstructive pulmonary disease, unspecified: Secondary | ICD-10-CM | POA: Diagnosis not present

## 2023-06-14 DIAGNOSIS — E119 Type 2 diabetes mellitus without complications: Secondary | ICD-10-CM

## 2023-06-14 DIAGNOSIS — Z87891 Personal history of nicotine dependence: Secondary | ICD-10-CM

## 2023-06-14 DIAGNOSIS — F03918 Unspecified dementia, unspecified severity, with other behavioral disturbance: Secondary | ICD-10-CM | POA: Insufficient documentation

## 2023-06-14 DIAGNOSIS — F0394 Unspecified dementia, unspecified severity, with anxiety: Secondary | ICD-10-CM | POA: Diagnosis present

## 2023-06-14 DIAGNOSIS — K219 Gastro-esophageal reflux disease without esophagitis: Secondary | ICD-10-CM | POA: Diagnosis present

## 2023-06-14 DIAGNOSIS — Z79890 Hormone replacement therapy (postmenopausal): Secondary | ICD-10-CM | POA: Diagnosis not present

## 2023-06-14 DIAGNOSIS — Z833 Family history of diabetes mellitus: Secondary | ICD-10-CM | POA: Diagnosis not present

## 2023-06-14 DIAGNOSIS — Z66 Do not resuscitate: Secondary | ICD-10-CM | POA: Diagnosis present

## 2023-06-14 DIAGNOSIS — F32A Depression, unspecified: Secondary | ICD-10-CM | POA: Insufficient documentation

## 2023-06-14 DIAGNOSIS — S72001A Fracture of unspecified part of neck of right femur, initial encounter for closed fracture: Principal | ICD-10-CM | POA: Diagnosis present

## 2023-06-14 DIAGNOSIS — Z90710 Acquired absence of both cervix and uterus: Secondary | ICD-10-CM

## 2023-06-14 DIAGNOSIS — Z79899 Other long term (current) drug therapy: Secondary | ICD-10-CM

## 2023-06-14 DIAGNOSIS — Z1152 Encounter for screening for COVID-19: Secondary | ICD-10-CM | POA: Diagnosis not present

## 2023-06-14 LAB — RESP PANEL BY RT-PCR (RSV, FLU A&B, COVID)  RVPGX2
Influenza A by PCR: NEGATIVE
Influenza B by PCR: NEGATIVE
Resp Syncytial Virus by PCR: NEGATIVE
SARS Coronavirus 2 by RT PCR: NEGATIVE

## 2023-06-14 LAB — CBC
HCT: 34.2 % — ABNORMAL LOW (ref 36.0–46.0)
Hemoglobin: 10.6 g/dL — ABNORMAL LOW (ref 12.0–15.0)
MCH: 26.6 pg (ref 26.0–34.0)
MCHC: 31 g/dL (ref 30.0–36.0)
MCV: 85.7 fL (ref 80.0–100.0)
Platelets: 200 10*3/uL (ref 150–400)
RBC: 3.99 MIL/uL (ref 3.87–5.11)
RDW: 15.9 % — ABNORMAL HIGH (ref 11.5–15.5)
WBC: 11.6 10*3/uL — ABNORMAL HIGH (ref 4.0–10.5)
nRBC: 0 % (ref 0.0–0.2)

## 2023-06-14 LAB — BASIC METABOLIC PANEL
Anion gap: 12 (ref 5–15)
BUN: 24 mg/dL — ABNORMAL HIGH (ref 8–23)
CO2: 24 mmol/L (ref 22–32)
Calcium: 9.1 mg/dL (ref 8.9–10.3)
Chloride: 102 mmol/L (ref 98–111)
Creatinine, Ser: 1.1 mg/dL — ABNORMAL HIGH (ref 0.44–1.00)
GFR, Estimated: 52 mL/min — ABNORMAL LOW (ref >60)
Glucose, Bld: 165 mg/dL — ABNORMAL HIGH (ref 70–99)
Potassium: 3.4 mmol/L — ABNORMAL LOW (ref 3.5–5.1)
Sodium: 138 mmol/L (ref 135–145)

## 2023-06-14 LAB — MAGNESIUM: Magnesium: 1.8 mg/dL (ref 1.7–2.4)

## 2023-06-14 MED ORDER — MORPHINE SULFATE (PF) 2 MG/ML IV SOLN: 0.5000 mg | INTRAVENOUS | Status: DC | PRN

## 2023-06-14 MED ORDER — MELATONIN 3 MG PO TABS
3.0000 mg | ORAL_TABLET | Freq: Every day | ORAL | Status: DC
  Administered 2023-06-14 – 2023-06-18 (×5): 3 mg via ORAL
  Filled 2023-06-14 (×5): qty 1

## 2023-06-14 MED ORDER — BUSPIRONE HCL 5 MG PO TABS: 5.0000 mg | ORAL_TABLET | Freq: Two times a day (BID) | ORAL | Status: DC

## 2023-06-14 MED ORDER — VENLAFAXINE HCL ER 37.5 MG PO CP24: 37.5000 mg | ORAL_CAPSULE | Freq: Every morning | ORAL | Status: DC

## 2023-06-14 MED ORDER — ONDANSETRON HCL 4 MG/2ML IJ SOLN
4.0000 mg | INTRAMUSCULAR | Status: DC | PRN
  Administered 2023-06-15: 4 mg via INTRAVENOUS

## 2023-06-14 MED ORDER — ALUM & MAG HYDROXIDE-SIMETH 200-200-20 MG/5ML PO SUSP: 30.0000 mL | Freq: Four times a day (QID) | ORAL | Status: DC | PRN

## 2023-06-14 MED ORDER — VITAMIN D (CHOLECALCIFEROL) 10 MCG (400 UNIT) PO TABS: 800.0000 [IU] | ORAL_TABLET | Freq: Every morning | ORAL | Status: DC

## 2023-06-14 MED ORDER — CEFTRIAXONE SODIUM 1 G IJ SOLR: 1.0000 g | INTRAMUSCULAR | Status: DC

## 2023-06-14 MED ORDER — SODIUM CHLORIDE 0.9 % IV SOLN: INTRAVENOUS | Status: DC

## 2023-06-14 MED ORDER — HYDROCODONE-ACETAMINOPHEN 5-325 MG PO TABS
1.0000 | ORAL_TABLET | Freq: Four times a day (QID) | ORAL | Status: DC | PRN
Start: 2023-06-14 — End: 2023-06-19
  Administered 2023-06-14: 1 via ORAL
  Administered 2023-06-15: 2 via ORAL
  Administered 2023-06-15 – 2023-06-19 (×2): 1 via ORAL
  Filled 2023-06-14: qty 1
  Filled 2023-06-14: qty 2
  Filled 2023-06-14 (×2): qty 1

## 2023-06-14 MED ORDER — MAGNESIUM HYDROXIDE 400 MG/5ML PO SUSP: 30.0000 mL | Freq: Every day | ORAL | Status: DC | PRN

## 2023-06-14 MED ORDER — OLANZAPINE 5 MG PO TABS
10.0000 mg | ORAL_TABLET | Freq: Every day | ORAL | Status: DC
  Administered 2023-06-15 – 2023-06-18 (×4): 10 mg via ORAL
  Filled 2023-06-14 (×4): qty 2

## 2023-06-14 MED ORDER — MEMANTINE HCL ER 7 MG PO CP24: 21.0000 mg | ORAL_CAPSULE | Freq: Every morning | ORAL | Status: DC

## 2023-06-14 MED ORDER — POTASSIUM CHLORIDE 20 MEQ PO PACK
40.0000 meq | PACK | Freq: Once | ORAL | Status: AC
  Administered 2023-06-14: 40 meq via ORAL
  Filled 2023-06-14: qty 2

## 2023-06-14 MED ORDER — LOPERAMIDE HCL 2 MG PO CAPS: 2.0000 mg | ORAL_CAPSULE | ORAL | Status: DC | PRN

## 2023-06-14 MED ORDER — LISINOPRIL 5 MG PO TABS
5.0000 mg | ORAL_TABLET | Freq: Every morning | ORAL | Status: DC
  Administered 2023-06-16 – 2023-06-19 (×3): 5 mg via ORAL
  Filled 2023-06-14 (×4): qty 1

## 2023-06-14 MED ORDER — TRAZODONE HCL 50 MG PO TABS
25.0000 mg | ORAL_TABLET | Freq: Every evening | ORAL | Status: DC | PRN
  Administered 2023-06-14 – 2023-06-16 (×2): 25 mg via ORAL
  Filled 2023-06-14 (×2): qty 1

## 2023-06-14 MED ORDER — SODIUM CHLORIDE 0.9 % IV SOLN
1.0000 g | INTRAVENOUS | Status: DC
  Administered 2023-06-14 – 2023-06-18 (×5): 1 g via INTRAVENOUS
  Filled 2023-06-14 (×5): qty 10

## 2023-06-14 MED ORDER — HALOPERIDOL LACTATE 5 MG/ML IJ SOLN: 1.0000 mg | Freq: Four times a day (QID) | INTRAMUSCULAR | Status: DC | PRN

## 2023-06-14 MED ORDER — LEVOTHYROXINE SODIUM 50 MCG PO TABS
50.0000 ug | ORAL_TABLET | Freq: Every day | ORAL | Status: DC
  Administered 2023-06-18 – 2023-06-19 (×2): 50 ug via ORAL
  Filled 2023-06-14 (×4): qty 1

## 2023-06-14 MED ORDER — FAMOTIDINE 20 MG PO TABS
20.0000 mg | ORAL_TABLET | Freq: Every morning | ORAL | Status: DC
Start: 2023-06-15 — End: 2023-06-19
  Administered 2023-06-17 – 2023-06-19 (×3): 20 mg via ORAL
  Filled 2023-06-14 (×4): qty 1

## 2023-06-14 MED ORDER — ENOXAPARIN SODIUM 40 MG/0.4ML IJ SOSY: 40.0000 mg | PREFILLED_SYRINGE | INTRAMUSCULAR | Status: DC

## 2023-06-14 MED ORDER — ACETAMINOPHEN 325 MG PO TABS
650.0000 mg | ORAL_TABLET | ORAL | Status: DC | PRN
  Administered 2023-06-16: 650 mg via ORAL
  Filled 2023-06-14: qty 2

## 2023-06-14 MED ORDER — MAGNESIUM HYDROXIDE 400 MG/5ML PO SUSP: 30.0000 mL | Freq: Every evening | ORAL | Status: DC | PRN

## 2023-06-14 NOTE — Assessment & Plan Note (Signed)
-   We will place the patient on supplemental coverage with NovoLog. - We will continue glipizide and hold off metformin.

## 2023-06-14 NOTE — Assessment & Plan Note (Addendum)
-   We will continue BuSpar, Remeron and escitalopram as well as Zyprexa.

## 2023-06-14 NOTE — ED Triage Notes (Signed)
 Patient brought from [facility] care center by [facility] for a fall this previous weekend. She was previously ambulatory and now must move by using a manual wheelchair. Patient denies pain, but doesn't seem to want to ambulate and difficult to determine due to patient's dementia.

## 2023-06-14 NOTE — Progress Notes (Signed)
 [name] liaison note:       This patient is a current hospice patient with [facility], admitted at her facility with a terminal diagnosis of cerebral atherosclerosis with vascular dementia.     We will continue to follow for any discharge planning needs and to coordinate continuation of hospice care.   Please don't hesitate to call with any Hospice related questions or concerns.    Thank you for the opportunity to participate in this patient's care. Thea Gist, Charity fundraiser, BSN [facility] Liaison [phone]

## 2023-06-14 NOTE — ED Provider Notes (Signed)
 [facility]   CSN: [id number] Arrival date & time: 06/14/23  1408     History  Chief Complaint  Patient presents with   Marletta Lor    Paula Andrews is a 78 y.o. female.  78 year old female past medical history of advanced dementia presenting to the emergency department today after she has stopped walking at her nursing facility.  The patient apparently had a fall last week and has been refusing to walk since.  She is denying any pain or specific complaints at this time.  She has not had any nausea or vomiting.  She came to the ER today for further evaluation after being sent in by her nursing home.   Fall       Home Medications Prior to Admission medications   Medication Sig Start Date End Date Taking? Authorizing Provider  acetaminophen (TYLENOL) 325 MG tablet Take 2 tablets (650 mg total) by mouth 2 (two) times daily as needed for mild pain or moderate pain. 11/28/19   Blane Ohara, MD  alum & mag hydroxide-simeth (MI-ACID) 200-200-20 MG/5ML suspension Take 30 mLs by mouth every 6 (six) hours as needed for indigestion or heartburn.    [provider]  busPIRone (BUSPAR) 5 MG tablet Take 5 mg by mouth 2 (two) times daily. 8am, 5pm 08/04/21   [provider]  famotidine (PEPCID) 20 MG tablet Take 20 mg by mouth every morning. 08/04/21   [provider]  levothyroxine (SYNTHROID) 50 MCG tablet Take 50 mcg by mouth daily at 6 (six) AM. 08/04/21   [provider]  lisinopril (ZESTRIL) 5 MG tablet Take 1 tablet (5 mg total) by mouth daily. Patient taking differently: Take 5 mg by mouth every morning. 11/28/19   Blane Ohara, MD  loperamide (IMODIUM) 2 MG capsule Take 2 mg by mouth as needed for diarrhea or loose stools (max 8 doses in 24 hours).    [provider]  magnesium hydroxide (MILK OF MAGNESIA) 400 MG/5ML suspension Take 30 mLs by mouth at bedtime as needed (constipation).     [provider]  melatonin 1 MG TABS tablet Take 1 mg by mouth at bedtime.    [provider]  memantine (NAMENDA XR) 21 MG CP24 24 hr capsule Take 1 capsule (21 mg total) by mouth daily. Patient taking differently: Take 21 mg by mouth every morning. 11/28/19   Blane Ohara, MD  metFORMIN (GLUCOPHAGE) 500 MG tablet Take 1 tablet (500 mg total) by mouth daily as needed (for high blood sugars). Patient taking differently: Take 500 mg by mouth 2 (two) times daily with a meal. 11/28/19   Blane Ohara, MD  OLANZapine (ZYPREXA) 2.5 MG tablet Take 2.5 mg by mouth daily at 2 PM. 08/04/21   [provider]  venlafaxine XR (EFFEXOR-XR) 37.5 MG 24 hr capsule Take 37.5 mg by mouth every morning. 08/04/21   [provider]  Vitamin D, Cholecalciferol, 10 MCG (400 UNIT) TABS Take 800 Units by mouth every morning.    [provider]      Allergies    Patient has no known allergies.    Review of Systems   Review of Systems  Reason unable to perform ROS: Advanced dementia.    Physical Exam Updated Vital Signs BP (!) 141/65   Pulse 88   Temp 98.2 F (36.8 C) (Temporal)   Resp 17   SpO2 92%  Physical Exam Vitals and nursing Andrews reviewed.  Gen: NAD Eyes: PERRL, EOMI HEENT: no oropharyngeal swelling Neck: trachea midline Resp: clear to auscultation bilaterally Card: RRR, no murmurs, rubs, or gallops Abd: nontender, nondistended Extremities: no calf tenderness, no edema, the patient is tender over the right and left hips with no obvious deformities noted, the remainder of the extremities are atraumatic MSK: No thoracic or lumbar spinal tenderness noted Vascular: 2+ radial pulses bilaterally, 2+ DP pulses bilaterally Neuro: The patient has equal strength in the bilateral upper and lower extremities Skin: no rashes Psyc: acting appropriately   ED Results / Procedures / Treatments   Labs (all labs ordered are listed, but only abnormal results are  displayed) Labs Reviewed  CBC - Abnormal; Notable for the following components:      Result Value   WBC 11.6 (*)    Hemoglobin 10.6 (*)    HCT 34.2 (*)    RDW 15.9 (*)    All other components within normal limits  BASIC METABOLIC PANEL - Abnormal; Notable for the following components:   Potassium 3.4 (*)    Glucose, Bld 165 (*)    BUN 24 (*)    Creatinine, Ser 1.10 (*)    GFR, Estimated 52 (*)    All other components within normal limits    EKG EKG Interpretation Date/Time:  Thursday June 14 2023 17:45:48 EDT Ventricular Rate:  73 PR Interval:  180 QRS Duration:  76 QT Interval:  400 QTC Calculation: 440 R Axis:   80  Text Interpretation: Normal sinus rhythm Minimal voltage criteria for LVH, may be normal variant ( Sokolow-Lyon ) Borderline ECG When compared with ECG of 08-Oct-2022 14:01, PREVIOUS ECG IS PRESENT Confirmed by Beckey Downing [phone]) on 06/14/2023 5:54:01 PM  Radiology CT Cervical Spine Wo Contrast Result Date: 06/14/2023 CLINICAL DATA:  Neck trauma (Age >= 65y) EXAM: CT CERVICAL SPINE WITHOUT CONTRAST TECHNIQUE: Multidetector CT imaging of the cervical spine was performed without intravenous contrast. Multiplanar CT image reconstructions were also generated. RADIATION DOSE REDUCTION: This exam was performed according to the departmental dose-optimization program which includes automated exposure control, adjustment of the mA and/or kV according to patient size and/or use of iterative reconstruction technique. COMPARISON:  02/08/2023 FINDINGS: Alignment: Straightening of normal lordosis. Unchanged trace anterolisthesis of C3 on C4. No traumatic subluxation. Skull base and vertebrae: No acute fracture. Vertebral body heights are maintained. The dens and skull base are intact. Soft tissues and spinal canal: No prevertebral fluid or swelling. No visible canal hematoma. Disc levels: Stable degenerative disc disease, most prominent C4-C5 and C6-C7. Upper chest: Emphysema. Other:  Carotid calcifications. IMPRESSION: 1. No acute fracture or subluxation of the cervical spine. 2. Stable degenerative disc disease. Electronically Signed   By: Narda Rutherford M.D.   On: 06/14/2023 19:36   CT Head Wo Contrast Result Date: 06/14/2023 CLINICAL DATA:  Head trauma, intracranial arterial injury suspected EXAM: CT HEAD WITHOUT CONTRAST TECHNIQUE: Contiguous axial images were obtained from the base of the skull through the vertex without intravenous contrast. RADIATION DOSE REDUCTION: This exam was performed according to the departmental dose-optimization program which includes automated exposure control, adjustment of the mA and/or kV according to patient size and/or use of iterative reconstruction technique. COMPARISON:  02/08/2023 FINDINGS: Brain: No intracranial hemorrhage, mass effect, or midline shift. Stable degree of atrophy no hydrocephalus. The basilar cisterns are patent. Unchanged periventricular and deep chronic small vessel ischemia. No evidence of territorial infarct or acute ischemia. No extra-axial or intracranial fluid collection. Vascular: Atherosclerosis of skullbase vasculature without hyperdense  vessel or abnormal calcification. Skull: No fracture or focal lesion. Sinuses/Orbits: Paranasal sinuses and mastoid air cells are clear. The visualized orbits are unremarkable. Other: None. IMPRESSION: 1. No acute intracranial abnormality. No skull fracture. 2. Unchanged atrophy and chronic small vessel ischemia. Electronically Signed   By: Narda Rutherford M.D.   On: 06/14/2023 19:31   DG Chest Portable 1 View Result Date: 06/14/2023 CLINICAL DATA:  Hypoxia. EXAM: PORTABLE CHEST 1 VIEW COMPARISON:  10/08/2022 FINDINGS: Lung volumes are low. Stable heart size and mediastinal contours. Mild hypoventilatory changes at the lung bases. Bronchovascular crowding versus vascular congestion. No pneumothorax or large pleural effusion. On limited assessment, no acute osseous findings. IMPRESSION:  Low lung volumes with hypoventilatory changes at the lung bases. Bronchovascular crowding versus vascular congestion. Electronically Signed   By: Narda Rutherford M.D.   On: 06/14/2023 17:38   DG HIP UNILAT WITH PELVIS 2-3 VIEWS LEFT Result Date: 06/14/2023 CLINICAL DATA:  Fall, does not want ambulate. EXAM: DG HIP (WITH OR WITHOUT PELVIS) 2-3V RIGHT; DG HIP (WITH OR WITHOUT PELVIS) 2-3V LEFT COMPARISON:  None Available. FINDINGS: Right hip: Displaced right femoral neck fracture. Proximal migration of the femoral shaft. The femoral head remains seated. Left hip: No acute fracture or dislocation. Mild hip joint space narrowing. No erosive change. Pelvis: No pelvic fracture. Pubic rami are intact. The pubic symphysis and sacroiliac joints are congruent. Mild overlying artifact from clothing. IMPRESSION: 1. Displaced right femoral neck fracture. 2. No acute fracture of the left hip or pelvis. Electronically Signed   By: Narda Rutherford M.D.   On: 06/14/2023 17:37   DG HIP UNILAT WITH PELVIS 2-3 VIEWS RIGHT Result Date: 06/14/2023 CLINICAL DATA:  Fall, does not want ambulate. EXAM: DG HIP (WITH OR WITHOUT PELVIS) 2-3V RIGHT; DG HIP (WITH OR WITHOUT PELVIS) 2-3V LEFT COMPARISON:  None Available. FINDINGS: Right hip: Displaced right femoral neck fracture. Proximal migration of the femoral shaft. The femoral head remains seated. Left hip: No acute fracture or dislocation. Mild hip joint space narrowing. No erosive change. Pelvis: No pelvic fracture. Pubic rami are intact. The pubic symphysis and sacroiliac joints are congruent. Mild overlying artifact from clothing. IMPRESSION: 1. Displaced right femoral neck fracture. 2. No acute fracture of the left hip or pelvis. Electronically Signed   By: Narda Rutherford M.D.   On: 06/14/2023 17:37    Procedures Procedures    Medications Ordered in ED Medications - No data to display  ED Course/ Medical Decision Making/ A&P                                 Medical  Decision Making 78 year old female with past medical history of dementia presenting to the emergency department today with inability/refusal to walk over the past week after a fall at her nursing facility.  I will obtain basic labs here as well as an EKG to evaluate for electrolyte abnormalities or anemia.  Will obtain a CT scan of the patient's head and cervical spine as well as a chest x-ray and hip x-ray to evaluate for possible injuries.  Her evaluation is complicated by her advanced dementia and is unclear if her inability/refusal to walk is secondary to this.  She seems to have normal range of motion so suspicion for fracture is relatively low.  I will reevaluate for ultimate disposition.  The patient's labs are reassuring.  X-ray does show a femoral neck fracture.  I did call  and discussed this with Dr. Charlesetta Ivory.  He recommends admission to the hospitalist service.  Calls placed for admission.  Plan is for surgery tomorrow.  Amount and/or Complexity of Data Reviewed Labs: ordered. Radiology: ordered.  Risk Decision regarding hospitalization.           Final Clinical Impression(s) / ED Diagnoses Final diagnoses:  Closed fracture of right hip, initial encounter [facility])    Rx / DC Orders ED Discharge Orders     None         Durwin Glaze, MD 06/14/23 2000

## 2023-06-14 NOTE — Assessment & Plan Note (Signed)
 -  We will continue Synthroid.

## 2023-06-14 NOTE — Assessment & Plan Note (Signed)
-   We will continue antihypertensive therapy.

## 2023-06-14 NOTE — Assessment & Plan Note (Addendum)
-   The patient admitted to a telemetry bed. - Pain management will be provided. - Orthopedic consult to be obtained. - Dr. Dallas Schimke was notified and is aware about the patient. - She will be kept n.p.o. after midnight. - The patient has no history of CHF, CVA, coronary artery disease or renal failure with a creatinine more than 2.  He is diabetic but is not insulin-dependent.  She is average risk for her age for perioperative cardiovascular events per the revised cardiac risk index.  She has no current pulmonary issues.

## 2023-06-14 NOTE — H&P (Signed)
 [place]   PATIENT NAME: Paula Andrews    MR#:  [id number]  DATE OF BIRTH:  [birth year]  DATE OF ADMISSION:  06/14/2023  PRIMARY CARE PHYSICIAN: Merlene Laughter, MD (Inactive)   Patient is coming from: [facility] memory care unit.  REQUESTING/REFERRING PHYSICIAN: Durwin Glaze, MD   CHIEF COMPLAINT:   Chief Complaint  Patient presents with   Fall    HISTORY OF PRESENT ILLNESS:  Paula Andrews is a 78 y.o. female with medical history significant for GERD, anxiety, osteoarthritis, dementia, depression, type 2 diabetes mellitus, essential hypertension, hypothyroidism and seasonal allergies, who presented to the emergency room with acute onset of unwitnessed fall at her ALF likely mechanical fall.  She apparently hit her head but there was no presyncope or syncope documented.  The patient has advanced dementia and therefore is a 9 historian.  No fever or chills.  Her daughter stated that this is her second fall this week.  No nausea or vomiting or abdominal pain were reported.  No fever or chills.  No reported dysuria, oliguria or hematuria or flank pain.  No reported cough or wheezing or dyspnea.  ED Course: When the patient came to the ER, BP was 141/65 and BP 160/79 with otherwise unremarkable vital signs.  Labs revealed mild hypokalemia of 3.4 and blood glucose of 165, BUN of 24 and creatinine 1.1.  CBC showed leukocytosis of 11.6 and hemoglobin 10.6 hematocrit 34.2 lower than previous levels.     EKG as reviewed by me : EKG showed normal sinus rhythm with a rate of 73 with minimal voltage criteria for LVH. Imaging: Pelvic and hip x-ray showed displaced right femoral neck fracture.Portable chest x-ray showed low lung volumes and bronchovascular crowding versus vascular congestion.  The patient will be admitted to a telemetry bed for further evaluation and management. PAST MEDICAL HISTORY:   Past Medical History:  Diagnosis Date   Acid reflux    Agitation due to dementia  (HCC)    Anxiety    Arthritis    Dementia (HCC)    Depression    Diabetes mellitus    Hypertension    Psychosis (HCC)    Seasonal allergies    Thyroid disease     PAST SURGICAL HISTORY:   Past Surgical History:  Procedure Laterality Date   ABDOMINAL HYSTERECTOMY      SOCIAL HISTORY:   Social History   Tobacco Use   Smoking status: Former    Current packs/day: 0.00    Average packs/day: 1 pack/day for 41.0 years (41.0 ttl pk-yrs)    Types: Cigarettes    Start date: 04/03/1960    Quit date: 04/03/2001    Years since quitting: 22.2   Smokeless tobacco: Never  Substance Use Topics   Alcohol use: No    FAMILY HISTORY:   Family History  Problem Relation Age of Onset   Diabetes Other     DRUG ALLERGIES:  No Known Allergies  REVIEW OF SYSTEMS:   ROS As per history of present illness otherwise unobtainable due to the patient's advanced dementia.  MEDICATIONS AT HOME:   Prior to Admission medications   Medication Sig Start Date End Date Taking? Authorizing Provider  acetaminophen (TYLENOL) 325 MG tablet Take 2 tablets (650 mg total) by mouth 2 (two) times daily as needed for mild pain or moderate pain. 11/28/19   Blane Ohara, MD  alum & mag hydroxide-simeth (MI-ACID) 200-200-20 MG/5ML suspension Take 30 mLs by mouth every 6 (  six) hours as needed for indigestion or heartburn.    [provider]  busPIRone (BUSPAR) 5 MG tablet Take 5 mg by mouth 2 (two) times daily. 8am, 5pm 08/04/21   [provider]  famotidine (PEPCID) 20 MG tablet Take 20 mg by mouth every morning. 08/04/21   [provider]  levothyroxine (SYNTHROID) 50 MCG tablet Take 50 mcg by mouth daily at 6 (six) AM. 08/04/21   [provider]  lisinopril (ZESTRIL) 5 MG tablet Take 1 tablet (5 mg total) by mouth daily. Patient taking differently: Take 5 mg by mouth every morning. 11/28/19   Blane Ohara, MD  loperamide (IMODIUM) 2 MG capsule Take 2 mg by mouth as needed for  diarrhea or loose stools (max 8 doses in 24 hours).    [provider]  magnesium hydroxide (MILK OF MAGNESIA) 400 MG/5ML suspension Take 30 mLs by mouth at bedtime as needed (constipation).    [provider]  melatonin 1 MG TABS tablet Take 1 mg by mouth at bedtime.    [provider]  memantine (NAMENDA XR) 21 MG CP24 24 hr capsule Take 1 capsule (21 mg total) by mouth daily. Patient taking differently: Take 21 mg by mouth every morning. 11/28/19   Blane Ohara, MD  metFORMIN (GLUCOPHAGE) 500 MG tablet Take 1 tablet (500 mg total) by mouth daily as needed (for high blood sugars). Patient taking differently: Take 500 mg by mouth 2 (two) times daily with a meal. 11/28/19   Blane Ohara, MD  OLANZapine (ZYPREXA) 2.5 MG tablet Take 2.5 mg by mouth daily at 2 PM. 08/04/21   [provider]  venlafaxine XR (EFFEXOR-XR) 37.5 MG 24 hr capsule Take 37.5 mg by mouth every morning. 08/04/21   [provider]  Vitamin D, Cholecalciferol, 10 MCG (400 UNIT) TABS Take 800 Units by mouth every morning.    [provider]      VITAL SIGNS:  Blood pressure (!) 160/79, pulse 67, temperature (!) 100.6 F (38.1 C), temperature source Axillary, resp. rate (!) 22, weight 52.4 kg, SpO2 95%.  PHYSICAL EXAMINATION:  Physical Exam  GENERAL:  78 y.o.-year-old Caucasian female patient lying in the bed with no acute distress.  EYES: Pupils equal, round, reactive to light and accommodation. No scleral icterus. Extraocular muscles intact.  HEENT: Head atraumatic, normocephalic. Oropharynx and nasopharynx clear.  NECK:  Supple, no jugular venous distention. No thyroid enlargement, no tenderness.  LUNGS: Normal breath sounds bilaterally, no wheezing, rales,rhonchi or crepitation. No use of accessory muscles of respiration.  CARDIOVASCULAR: Regular rate and rhythm, S1, S2 normal. No murmurs, rubs, or gallops.  ABDOMEN: Soft, nondistended, nontender. Bowel sounds present.  No organomegaly or mass.  EXTREMITIES: No pedal edema, cyanosis, or clubbing.  NEUROLOGIC: Cranial nerves II through XII are intact. Muscle strength 5/5 in all extremities. Sensation intact. Gait not checked.  PSYCHIATRIC: The patient is alert and globally confused.  She was nonverbal. SKIN: No obvious rash, lesion, or ulcer.   LABORATORY PANEL:   CBC Recent Labs  Lab 06/14/23 1545  WBC 11.6*  HGB 10.6*  HCT 34.2*  PLT 200   ------------------------------------------------------------------------------------------------------------------  Chemistries  Recent Labs  Lab 06/14/23 1545  NA 138  K 3.4*  CL 102  CO2 24  GLUCOSE 165*  BUN 24*  CREATININE 1.10*  CALCIUM 9.1   ------------------------------------------------------------------------------------------------------------------  Cardiac Enzymes No results for input(s): "TROPONINI" in the last 168 hours. ------------------------------------------------------------------------------------------------------------------  RADIOLOGY:  CT Cervical Spine Wo Contrast Result Date: 06/14/2023  CLINICAL DATA:  Neck trauma (Age >= 65y) EXAM: CT CERVICAL SPINE WITHOUT CONTRAST TECHNIQUE: Multidetector CT imaging of the cervical spine was performed without intravenous contrast. Multiplanar CT image reconstructions were also generated. RADIATION DOSE REDUCTION: This exam was performed according to the departmental dose-optimization program which includes automated exposure control, adjustment of the mA and/or kV according to patient size and/or use of iterative reconstruction technique. COMPARISON:  02/08/2023 FINDINGS: Alignment: Straightening of normal lordosis. Unchanged trace anterolisthesis of C3 on C4. No traumatic subluxation. Skull base and vertebrae: No acute fracture. Vertebral body heights are maintained. The dens and skull base are intact. Soft tissues and spinal canal: No prevertebral fluid or swelling. No visible canal  hematoma. Disc levels: Stable degenerative disc disease, most prominent C4-C5 and C6-C7. Upper chest: Emphysema. Other: Carotid calcifications. IMPRESSION: 1. No acute fracture or subluxation of the cervical spine. 2. Stable degenerative disc disease. Electronically Signed   By: Narda Rutherford M.D.   On: 06/14/2023 19:36   CT Head Wo Contrast Result Date: 06/14/2023 CLINICAL DATA:  Head trauma, intracranial arterial injury suspected EXAM: CT HEAD WITHOUT CONTRAST TECHNIQUE: Contiguous axial images were obtained from the base of the skull through the vertex without intravenous contrast. RADIATION DOSE REDUCTION: This exam was performed according to the departmental dose-optimization program which includes automated exposure control, adjustment of the mA and/or kV according to patient size and/or use of iterative reconstruction technique. COMPARISON:  02/08/2023 FINDINGS: Brain: No intracranial hemorrhage, mass effect, or midline shift. Stable degree of atrophy no hydrocephalus. The basilar cisterns are patent. Unchanged periventricular and deep chronic small vessel ischemia. No evidence of territorial infarct or acute ischemia. No extra-axial or intracranial fluid collection. Vascular: Atherosclerosis of skullbase vasculature without hyperdense vessel or abnormal calcification. Skull: No fracture or focal lesion. Sinuses/Orbits: Paranasal sinuses and mastoid air cells are clear. The visualized orbits are unremarkable. Other: None. IMPRESSION: 1. No acute intracranial abnormality. No skull fracture. 2. Unchanged atrophy and chronic small vessel ischemia. Electronically Signed   By: Narda Rutherford M.D.   On: 06/14/2023 19:31   DG Chest Portable 1 View Result Date: 06/14/2023 CLINICAL DATA:  Hypoxia. EXAM: PORTABLE CHEST 1 VIEW COMPARISON:  10/08/2022 FINDINGS: Lung volumes are low. Stable heart size and mediastinal contours. Mild hypoventilatory changes at the lung bases. Bronchovascular crowding versus  vascular congestion. No pneumothorax or large pleural effusion. On limited assessment, no acute osseous findings. IMPRESSION: Low lung volumes with hypoventilatory changes at the lung bases. Bronchovascular crowding versus vascular congestion. Electronically Signed   By: Narda Rutherford M.D.   On: 06/14/2023 17:38   DG HIP UNILAT WITH PELVIS 2-3 VIEWS LEFT Result Date: 06/14/2023 CLINICAL DATA:  Fall, does not want ambulate. EXAM: DG HIP (WITH OR WITHOUT PELVIS) 2-3V RIGHT; DG HIP (WITH OR WITHOUT PELVIS) 2-3V LEFT COMPARISON:  None Available. FINDINGS: Right hip: Displaced right femoral neck fracture. Proximal migration of the femoral shaft. The femoral head remains seated. Left hip: No acute fracture or dislocation. Mild hip joint space narrowing. No erosive change. Pelvis: No pelvic fracture. Pubic rami are intact. The pubic symphysis and sacroiliac joints are congruent. Mild overlying artifact from clothing. IMPRESSION: 1. Displaced right femoral neck fracture. 2. No acute fracture of the left hip or pelvis. Electronically Signed   By: Narda Rutherford M.D.   On: 06/14/2023 17:37   DG HIP UNILAT WITH PELVIS 2-3 VIEWS RIGHT Result Date: 06/14/2023 CLINICAL DATA:  Fall, does not want ambulate. EXAM: DG HIP (WITH OR WITHOUT PELVIS) 2-3V RIGHT;  DG HIP (WITH OR WITHOUT PELVIS) 2-3V LEFT COMPARISON:  None Available. FINDINGS: Right hip: Displaced right femoral neck fracture. Proximal migration of the femoral shaft. The femoral head remains seated. Left hip: No acute fracture or dislocation. Mild hip joint space narrowing. No erosive change. Pelvis: No pelvic fracture. Pubic rami are intact. The pubic symphysis and sacroiliac joints are congruent. Mild overlying artifact from clothing. IMPRESSION: 1. Displaced right femoral neck fracture. 2. No acute fracture of the left hip or pelvis. Electronically Signed   By: Narda Rutherford M.D.   On: 06/14/2023 17:37      IMPRESSION AND PLAN:  Assessment and  Plan: * Closed right hip fracture, initial encounter [facility]) - The patient admitted to a telemetry bed. - Pain management will be provided. - Orthopedic consult to be obtained. - Dr. Dallas Schimke was notified and is aware about the patient. - She will be kept n.p.o. after midnight. - The patient has no history of CHF, CVA, coronary artery disease or renal failure with a creatinine more than 2.  He is diabetic but is not insulin-dependent.  She is average risk for her age for perioperative cardiovascular events per the revised cardiac risk index.  She has no current pulmonary issues.  Hypokalemia - We will replace potassium and check magnesium level.  Type 2 diabetes mellitus without complications (HCC) - We will place the patient on supplemental coverage with NovoLog. - We will continue glipizide and hold off metformin.  Hypothyroidism - We will continue Synthroid.  Dementia with behavioral disturbance (HCC) - We will continue the Namenda and Zyprexa.  Essential hypertension - We will continue antihypertensive therapy.  Anxiety and depression - We will continue BuSpar, Remeron and escitalopram as well as Zyprexa.    DVT prophylaxis:  SCDs.  Medical prophylaxis is postponed till postoperative period. Advanced Care Planning:  Code Status: full code she is DNR and DNI. Family Communication:  The plan of care was discussed in details with the patient (and family). I answered all questions. The patient agreed to proceed with the above mentioned plan. Further management will depend upon hospital course. Disposition Plan: Back to previous home environment Consults called: Orthopedic consult All the records are reviewed and case discussed with ED provider.  Status is: Inpatient   At the time of the admission, it appears that the appropriate admission status for this patient is inpatient.  This is judged to be reasonable and necessary in order to provide the required intensity of service to  ensure the patient's safety given the presenting symptoms, physical exam findings and initial radiographic and laboratory data in the context of comorbid conditions.  The patient requires inpatient status due to high intensity of service, high risk of further deterioration and high frequency of surveillance required.  I certify that at the time of admission, it is my clinical judgment that the patient will require inpatient hospital care extending more than 2 midnights.                            Dispo: The patient is from: Home              Anticipated d/c is to: Home              Patient currently is not medically stable to d/c.              Difficult to place patient: No  Hannah Beat M.D on 06/14/2023 at 9:55  PM  [facility]   From 7 PM-7 AM, contact night-coverage [email or web address]  CC: Primary care physician; Merlene Laughter, MD (Inactive)

## 2023-06-14 NOTE — Assessment & Plan Note (Signed)
-   We will continue the Namenda and Zyprexa.

## 2023-06-14 NOTE — Progress Notes (Signed)
   06/14/23 2150  TOC Brief Assessment  Insurance and Status Reviewed  Patient has primary care physician Yes  Home environment has been reviewed [facility] c/[facility]  Readmission risk has been reviewed Yes   Pt c/advanced dementia from [facility]. Transition of Care Department [facility]) will continue to monitor and address needs.

## 2023-06-14 NOTE — Assessment & Plan Note (Signed)
-   We will replace potassium and check magnesium level.

## 2023-06-15 ENCOUNTER — Inpatient Hospital Stay (HOSPITAL_COMMUNITY)

## 2023-06-15 ENCOUNTER — Encounter (HOSPITAL_COMMUNITY): Payer: Self-pay | Admitting: Family Medicine

## 2023-06-15 ENCOUNTER — Other Ambulatory Visit: Payer: Self-pay

## 2023-06-15 ENCOUNTER — Encounter (HOSPITAL_COMMUNITY)
Admission: EM | Disposition: A | Discharge status: Skilled Nursing Facility | Payer: Self-pay | Source: Skilled Nursing Facility | Attending: Family Medicine

## 2023-06-15 DIAGNOSIS — I10 Essential (primary) hypertension: Secondary | ICD-10-CM | POA: Diagnosis not present

## 2023-06-15 DIAGNOSIS — J449 Chronic obstructive pulmonary disease, unspecified: Secondary | ICD-10-CM | POA: Diagnosis not present

## 2023-06-15 DIAGNOSIS — F03918 Unspecified dementia, unspecified severity, with other behavioral disturbance: Secondary | ICD-10-CM | POA: Diagnosis not present

## 2023-06-15 DIAGNOSIS — S72001A Fracture of unspecified part of neck of right femur, initial encounter for closed fracture: Secondary | ICD-10-CM

## 2023-06-15 DIAGNOSIS — E876 Hypokalemia: Secondary | ICD-10-CM | POA: Diagnosis not present

## 2023-06-15 DIAGNOSIS — Z87891 Personal history of nicotine dependence: Secondary | ICD-10-CM

## 2023-06-15 HISTORY — PX: TOTAL HIP ARTHROPLASTY: SHX124

## 2023-06-15 LAB — URINALYSIS, ROUTINE W REFLEX MICROSCOPIC
Bacteria, UA: NONE SEEN
Bilirubin Urine: NEGATIVE
Glucose, UA: NEGATIVE mg/dL
Hgb urine dipstick: NEGATIVE
Ketones, ur: NEGATIVE mg/dL
Leukocytes,Ua: NEGATIVE
Nitrite: NEGATIVE
Protein, ur: 100 mg/dL — AB
Specific Gravity, Urine: 1.018 (ref 1.005–1.030)
pH: 6 (ref 5.0–8.0)

## 2023-06-15 LAB — GLUCOSE, CAPILLARY
Glucose-Capillary: 119 mg/dL — ABNORMAL HIGH (ref 70–99)
Glucose-Capillary: 99 mg/dL (ref 70–99)

## 2023-06-15 LAB — CBC
HCT: 34.4 % — ABNORMAL LOW (ref 36.0–46.0)
Hemoglobin: 10.6 g/dL — ABNORMAL LOW (ref 12.0–15.0)
MCH: 27 pg (ref 26.0–34.0)
MCHC: 30.8 g/dL (ref 30.0–36.0)
MCV: 87.5 fL (ref 80.0–100.0)
Platelets: 203 10*3/uL (ref 150–400)
RBC: 3.93 MIL/uL (ref 3.87–5.11)
RDW: 16.1 % — ABNORMAL HIGH (ref 11.5–15.5)
WBC: 9.3 10*3/uL (ref 4.0–10.5)
nRBC: 0 % (ref 0.0–0.2)

## 2023-06-15 LAB — BASIC METABOLIC PANEL
Anion gap: 8 (ref 5–15)
BUN: 22 mg/dL (ref 8–23)
CO2: 23 mmol/L (ref 22–32)
Calcium: 8.8 mg/dL — ABNORMAL LOW (ref 8.9–10.3)
Chloride: 109 mmol/L (ref 98–111)
Creatinine, Ser: 0.79 mg/dL (ref 0.44–1.00)
GFR, Estimated: >60 mL/min (ref >60)
Glucose, Bld: 127 mg/dL — ABNORMAL HIGH (ref 70–99)
Potassium: 4 mmol/L (ref 3.5–5.1)
Sodium: 140 mmol/L (ref 135–145)

## 2023-06-15 LAB — SURGICAL PCR SCREEN
MRSA, PCR: NEGATIVE
Staphylococcus aureus: NEGATIVE

## 2023-06-15 SURGERY — ARTHROPLASTY, HIP, TOTAL,POSTERIOR APPROACH
Anesthesia: Spinal | Site: Hip | Laterality: Right

## 2023-06-15 MED ORDER — LACTATED RINGERS IV SOLN: INTRAVENOUS | Status: DC | PRN

## 2023-06-15 MED ORDER — CEFAZOLIN SODIUM-DEXTROSE 2-4 GM/100ML-% IV SOLN
2.0000 g | INTRAVENOUS | Status: AC
  Filled 2023-06-15: qty 100

## 2023-06-15 MED ORDER — ORAL CARE MOUTH RINSE: 15.0000 mL | Freq: Once | OROMUCOSAL | Status: DC

## 2023-06-15 MED ORDER — LIDOCAINE HCL (PF) 2 % IJ SOLN
INTRAMUSCULAR | Status: AC
  Filled 2023-06-15: qty 5

## 2023-06-15 MED ORDER — VANCOMYCIN HCL 1000 MG IV SOLR
INTRAVENOUS | Status: AC
  Filled 2023-06-15: qty 20

## 2023-06-15 MED ORDER — CEFAZOLIN SODIUM-DEXTROSE 2-3 GM-%(50ML) IV SOLR
INTRAVENOUS | Status: DC | PRN
  Administered 2023-06-15: 2 g via INTRAVENOUS

## 2023-06-15 MED ORDER — PROPOFOL 500 MG/50ML IV EMUL
INTRAVENOUS | Status: AC
  Filled 2023-06-15: qty 50

## 2023-06-15 MED ORDER — PHENYLEPHRINE HCL-NACL 20-0.9 MG/250ML-% IV SOLN
INTRAVENOUS | Status: DC | PRN
  Administered 2023-06-15: 45 ug/min via INTRAVENOUS

## 2023-06-15 MED ORDER — FENTANYL CITRATE (PF) 100 MCG/2ML IJ SOLN
INTRAMUSCULAR | Status: AC
Start: 2023-06-15 — End: ?
  Filled 2023-06-15: qty 2

## 2023-06-15 MED ORDER — MIDAZOLAM HCL 2 MG/2ML IJ SOLN
INTRAMUSCULAR | Status: AC
  Filled 2023-06-15: qty 2

## 2023-06-15 MED ORDER — TRANEXAMIC ACID-NACL 1000-0.7 MG/100ML-% IV SOLN
1000.0000 mg | INTRAVENOUS | Status: AC
  Administered 2023-06-15: 1000 mg via INTRAVENOUS
  Filled 2023-06-15: qty 100

## 2023-06-15 MED ORDER — VANCOMYCIN HCL 1000 MG IV SOLR
INTRAVENOUS | Status: DC | PRN
  Administered 2023-06-15: 1000 mg via TOPICAL

## 2023-06-15 MED ORDER — LACTATED RINGERS IV SOLN: INTRAVENOUS | Status: DC

## 2023-06-15 MED ORDER — PROPOFOL 10 MG/ML IV BOLUS
INTRAVENOUS | Status: DC | PRN
  Administered 2023-06-15: 70 ug/kg/min via INTRAVENOUS
  Administered 2023-06-15: 60 mg via INTRAVENOUS

## 2023-06-15 MED ORDER — 0.9 % SODIUM CHLORIDE (POUR BTL) OPTIME
TOPICAL | Status: DC | PRN
  Administered 2023-06-15: 1000 mL

## 2023-06-15 MED ORDER — LIDOCAINE HCL (PF) 2 % IJ SOLN
INTRAMUSCULAR | Status: DC | PRN
  Administered 2023-06-15: 60 mg via INTRADERMAL

## 2023-06-15 MED ORDER — PROPOFOL 10 MG/ML IV BOLUS
INTRAVENOUS | Status: AC
  Filled 2023-06-15: qty 20

## 2023-06-15 MED ORDER — ONDANSETRON HCL 4 MG/2ML IJ SOLN
INTRAMUSCULAR | Status: AC
  Filled 2023-06-15: qty 2

## 2023-06-15 MED ORDER — PHENYLEPHRINE HCL-NACL 20-0.9 MG/250ML-% IV SOLN
INTRAVENOUS | Status: AC
  Filled 2023-06-15: qty 250

## 2023-06-15 MED ORDER — SODIUM CHLORIDE 0.9 % IR SOLN
Status: DC | PRN
  Administered 2023-06-15: 3000 mL

## 2023-06-15 MED ORDER — SODIUM CHLORIDE 0.9 % IV SOLN: INTRAVENOUS | Status: AC

## 2023-06-15 MED ORDER — PROPOFOL 500 MG/50ML IV EMUL
INTRAVENOUS | Status: AC
  Filled 2023-06-15: qty 100

## 2023-06-15 MED ORDER — HYDRALAZINE HCL 20 MG/ML IJ SOLN
10.0000 mg | Freq: Four times a day (QID) | INTRAMUSCULAR | Status: DC | PRN
  Administered 2023-06-15: 10 mg via INTRAVENOUS
  Filled 2023-06-15: qty 1

## 2023-06-15 MED ORDER — BUPIVACAINE-EPINEPHRINE (PF) 0.5% -1:200000 IJ SOLN
INTRAMUSCULAR | Status: AC
  Filled 2023-06-15: qty 30

## 2023-06-15 MED ORDER — CHLORHEXIDINE GLUCONATE 0.12 % MT SOLN: 15.0000 mL | Freq: Once | OROMUCOSAL | Status: DC

## 2023-06-15 SURGICAL SUPPLY — 53 items
ANCHOR SUT MAYO CAT SZ1 1/2 CI (Anchor) ×1 IMPLANT
BIPOLAR PROS AML 46 (Hips) ×1 IMPLANT
BIT DRILL 2.8X128 (BIT) ×1 IMPLANT
BLADE HEX COATED 2.75 (ELECTRODE) ×1 IMPLANT
BLADE SAGITTAL 25.0X1.27X90 (BLADE) ×1 IMPLANT
CEMENT HV SMART SET (Cement) IMPLANT
CHLORAPREP W/TINT 26 (MISCELLANEOUS) IMPLANT
COUNTER NDL MAGNETIC 40 RED (SET/KITS/TRAYS/PACK) ×1 IMPLANT
COUNTER NEEDLE MAGNETIC 40 RED (SET/KITS/TRAYS/PACK) ×1 IMPLANT
COVER LIGHT HANDLE STERIS (MISCELLANEOUS) ×2 IMPLANT
DRAPE BACK TABLE (DRAPES) ×1 IMPLANT
DRAPE HIP W/POCKET STRL (MISCELLANEOUS) ×1 IMPLANT
DRAPE U-SHAPE 47X51 STRL (DRAPES) ×1 IMPLANT
DRSG MEPILEX SACRM 8.7X9.8 (GAUZE/BANDAGES/DRESSINGS) ×1 IMPLANT
ELECT REM PT RETURN 9FT ADLT (ELECTROSURGICAL) ×1 IMPLANT
ELECTRODE REM PT RTRN 9FT ADLT (ELECTROSURGICAL) ×1 IMPLANT
GAUZE XEROFORM 1X8 LF (GAUZE/BANDAGES/DRESSINGS) IMPLANT
GLOVE BIO SURGEON STRL SZ7 (GLOVE) IMPLANT
GLOVE BIO SURGEON STRL SZ8 (GLOVE) ×4 IMPLANT
GLOVE BIOGEL PI IND STRL 7.0 (GLOVE) ×2 IMPLANT
GLOVE BIOGEL PI IND STRL 8 (GLOVE) ×1 IMPLANT
GOWN STRL REUS W/TWL LRG LVL3 (GOWN DISPOSABLE) ×1 IMPLANT
GOWN STRL REUS W/TWL XL LVL3 (GOWN DISPOSABLE) ×4 IMPLANT
HEAD BIPOLAR PROS AML 46 (Hips) IMPLANT
HEAD FEM STD 28X+1.5 STRL (Hips) IMPLANT
INST SET MAJOR BONE (KITS) ×1 IMPLANT
KIT TURNOVER KIT A (KITS) ×1 IMPLANT
MANIFOLD NEPTUNE II (INSTRUMENTS) ×1 IMPLANT
MARKER SKIN DUAL TIP RULER LAB (MISCELLANEOUS) ×1 IMPLANT
NDL HYPO 21X1.5 SAFETY (NEEDLE) ×1 IMPLANT
NDL MAYO 1/2 CRC TROCAR PT (NEEDLE) IMPLANT
NEEDLE HYPO 21X1.5 SAFETY (NEEDLE) ×1 IMPLANT
NEEDLE MAYO 1/2 CRC TROCAR PT (NEEDLE) ×1 IMPLANT
NS IRRIG 1000ML POUR BTL (IV SOLUTION) ×1 IMPLANT
PACK TOTAL JOINT (CUSTOM PROCEDURE TRAY) ×1 IMPLANT
PAD ARMBOARD POSITIONER FOAM (MISCELLANEOUS) ×1 IMPLANT
PILLOW ABDUCTION MEDIUM (MISCELLANEOUS) IMPLANT
SET BASIN LINEN APH (SET/KITS/TRAYS/PACK) ×1 IMPLANT
SET HNDPC FAN SPRY TIP SCT (DISPOSABLE) ×1 IMPLANT
SOL .9 NS 3000ML IRR UROMATIC (IV SOLUTION) ×1 IMPLANT
STAPLER VISISTAT (STAPLE) ×1 IMPLANT
STEM FEM ACTIS STD SZ7 (Nail) IMPLANT
SUT BRALON NAB BRD #1 30IN (SUTURE) ×2 IMPLANT
SUT ETHIBOND 5 LR DA (SUTURE) ×1 IMPLANT
SUT MNCRL 0 VIOLET CTX 36 (SUTURE) ×1 IMPLANT
SUT MNCRL AB 4-0 PS2 18 (SUTURE) ×1 IMPLANT
SUT MON AB 2-0 CT1 36 (SUTURE) ×2 IMPLANT
SUT VIC AB 1 CT1 27XBRD ANTBC (SUTURE) ×2 IMPLANT
SYR 30ML LL (SYRINGE) IMPLANT
SYR BULB IRRIG 60ML STRL (SYRINGE) ×1 IMPLANT
TOWEL OR 17X26 4PK STRL BLUE (TOWEL DISPOSABLE) ×1 IMPLANT
TRAY FOLEY MTR SLVR 16FR STAT (SET/KITS/TRAYS/PACK) ×1 IMPLANT
YANKAUER SUCT 12FT TUBE ARGYLE (SUCTIONS) ×1 IMPLANT

## 2023-06-15 NOTE — Progress Notes (Signed)
 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 is a current [facility] patient with a terminal diagnosis of cerebral atherosclerosis with vascular dementia who resides at [facility]. Patient had a fall a few days prior to hospitalization but continued to ambulate. She began having more pain and was transferred to hospital and admitted on 3.13 with right femoral neck fracture. Per Dr. Patric Dykes with [facility] this is a related hospital admission.   Visited in room however patient had already been taken to surgery. Husband and daughter present. We discussed events leading up to now, they relay that she was significantly uncomfortable and are hopeful this surgery will help with that. We discussed how things might look after surgery with patient possibly needing skilled rehab but encouraged them to wait until after surgery to make any definite plans.   Patient is inpatient appropriate with need for surgical fixation of hip fracture.   Vital Signs- 98.8/65/17     96% room air      Intake/Output- not yet recorded Abnormal labs- K+ 3.4, BUN 24, Creatinine 1.10, GFR 52, WBC 11.6, Hgb 10.6, Hct 34.2 Diagnostics-  DG HIP (WITH OR WITHOUT PELVIS) 2-3V RIGHT; DG HIP (WITH OR WITHOUT PELVIS) 2-3V LEFT   IMPRESSION: 1. Displaced right femoral neck fracture. 2. No acute fracture of the left hip or pelvis.    Electronically Signed   By: Narda Rutherford M.D.   On: 06/14/2023 17:37       IV/PRN Meds- Hydralazine 10mg  IV once Problem List as per H & P 3.13.25 Dr. Valente David Closed right hip fracture, initial encounter [facility]) - The patient admitted to a telemetry bed. - Pain management will be provided. - Orthopedic consult to be obtained. - Dr. Dallas Schimke was notified and is aware about the patient. - She will be kept n.p.o. after midnight. - The patient has no history of CHF, CVA, coronary artery disease or renal failure with a creatinine  more than 2.  He is diabetic but is not insulin-dependent.  She is average risk for her age for perioperative cardiovascular events per the revised cardiac risk index.  She has no current pulmonary issues.   Hypokalemia - We will replace potassium and check magnesium level.   Type 2 diabetes mellitus without complications (HCC) - We will place the patient on supplemental coverage with NovoLog. - We will continue glipizide and hold off metformin.  Discharge Planning- Discharge back to facility vs skilled rehab Family Contact- talked with family at bedside IDT- Updated Goals of care - DNR but family desires surgical fixation of fracture Thea Gist, RN, [facility] Liaison [phone]

## 2023-06-15 NOTE — Plan of Care (Signed)

## 2023-06-15 NOTE — Progress Notes (Deleted)
   06/15/23 0350  Complete When Order for Surgery Written (All Areas)  ID Verified and ID Band Applied Yes  Ordered ERAS meds (Tylenol, Celebrex, Gabapentin) given? None ordered  Was patient on a Beta Blocker prior to hospital arrival? No  Does the patient have diabetes? No diagnosis of diabetes  Complete When Order for Surgery Written:  Pre-op Surgical PCR Testing (All Areas )  PCR Swab Performed Yes  PCR Swab Date 06/14/23  RN PERFORMING Vashti Hey, RN  Patient Educated? Yes  Swab Result Positive? No  Complete Within 24 Hours of Surgery (All Areas)  CHG  Bath Completed Yes   CHG Cloths/Bath/Shower Completed 1 night prior;Day of surgery  Skin Prep per Physician Order? CHG  For Patients with FOLEY CATHETERS:  Complete Within One Hour of Surgery (All Areas)  Hygiene Peri care  NPO (All Areas)  NPO past Midnight Yes  Date of last liquid 06/14/23  Time of last liquid 2330  Date of last solid 06/14/23  Time of last solid 2312  Patient belongings (All Areas)  Glasses,Contacts, Jewelry, Hairpins/Clips, Clothing, etc. Removed Yes  Patient/Family advised about valuables policy? Yes  Home Medications No meds brought to hospital  Patient Belongings Kept at bedside  Belongings at Bedside Clothing

## 2023-06-15 NOTE — Anesthesia Procedure Notes (Signed)
 Spinal  Patient location during procedure: OR Start time: 06/15/2023 12:10 PM End time: 06/15/2023 12:15 PM Reason for block: surgical anesthesia Staffing Performed: resident/CRNA  Anesthesiologist: Windell Norfolk, MD Resident/CRNA: Modena Slater, RN Performed by: Windell Norfolk, MD Authorized by: Windell Norfolk, MD   Preanesthetic Checklist Completed: patient identified, IV checked, site marked, surgical consent, monitors and equipment checked, pre-op evaluation and timeout performed Spinal Block Patient position: right lateral decubitus Prep: ChloraPrep Patient monitoring: heart rate, continuous pulse ox and blood pressure Approach: midline Location: L2-3 Injection technique: single-shot Needle Needle type: Spinocan  Needle gauge: 22 G Needle length: 9 cm Assessment Events: CSF return Additional Notes B Braun Spinal Tray LOT [id number] EXP 2024-09-30  B Braun Spinocan LOT [id number] EXP 2026-01-31

## 2023-06-15 NOTE — Anesthesia Preprocedure Evaluation (Signed)
 Anesthesia Evaluation  Patient identified by MRN, date of birth, ID band Patient confused    Reviewed: Allergy & Precautions, H&P , NPO status , Patient's Chart, lab work & pertinent test results, reviewed documented beta blocker date and time , Unable to perform ROS - Chart review only  Airway Mallampati: II  TM Distance: >3 FB Neck ROM: full    Dental no notable dental hx.    Pulmonary pneumonia, COPD, former smoker   Pulmonary exam normal breath sounds clear to auscultation       Cardiovascular Exercise Tolerance: Good hypertension,  Rhythm:regular Rate:Normal     Neuro/Psych  PSYCHIATRIC DISORDERS Anxiety Depression   Dementia negative neurological ROS     GI/Hepatic Neg liver ROS,GERD  ,,  Endo/Other  diabetesHypothyroidism    Renal/GU negative Renal ROS  negative genitourinary   Musculoskeletal   Abdominal   Peds  Hematology negative hematology ROS (+)   Anesthesia Other Findings   Reproductive/Obstetrics negative OB ROS                             Anesthesia Physical Anesthesia Plan  ASA: 3  Anesthesia Plan: Spinal   Post-op Pain Management:    Induction:   PONV Risk Score and Plan: Propofol infusion  Airway Management Planned:   Additional Equipment:   Intra-op Plan:   Post-operative Plan:   Informed Consent: I have reviewed the patients History and Physical, chart, labs and discussed the procedure including the risks, benefits and alternatives for the proposed anesthesia with the patient or authorized representative who has indicated his/her understanding and acceptance.     Dental Advisory Given  Plan Discussed with: CRNA  Anesthesia Plan Comments:        Anesthesia Quick Evaluation

## 2023-06-15 NOTE — Progress Notes (Signed)
 PROGRESS NOTE   Paula Andrews  ZOX:[id number] DOB: 1945/07/27 DOA: 06/14/2023 PCP: Merlene Laughter, MD (Inactive)   Chief Complaint  Patient presents with   Fall   Level of care: Telemetry  Brief Admission History:  78 y.o. female with medical history significant for GERD, anxiety, osteoarthritis, dementia, depression, type 2 diabetes mellitus, essential hypertension, hypothyroidism and seasonal allergies, who presented to the emergency room with acute onset of unwitnessed fall at her ALF likely mechanical fall.  She apparently hit her head but there was no presyncope or syncope documented.  The patient has advanced dementia and therefore is a 9 historian.  No fever or chills.  Her daughter stated that this is her second fall this week.  No nausea or vomiting or abdominal pain were reported.  No fever or chills.  No reported dysuria, oliguria or hematuria or flank pain.  No reported cough or wheezing or dyspnea.   ED Course: When the patient came to the ER, BP was 141/65 and BP 160/79 with otherwise unremarkable vital signs.  Labs revealed mild hypokalemia of 3.4 and blood glucose of 165, BUN of 24 and creatinine 1.1.  CBC showed leukocytosis of 11.6 and hemoglobin 10.6 hematocrit 34.2 lower than previous levels.     EKG as reviewed by me : EKG showed normal sinus rhythm with a rate of 73 with minimal voltage criteria for LVH. Imaging: Pelvic and hip x-ray showed displaced right femoral neck fracture.Portable chest x-ray showed low lung volumes and bronchovascular crowding versus vascular congestion.   The patient will be admitted to a telemetry bed for further evaluation and management.   Assessment and Plan: Closed right hip fracture, initial encounter - The patient admitted to a telemetry bed. - Pain management will be provided. - Orthopedic consult to be obtained. - Dr. Dallas Schimke taking to OR today for ORIF   Dementia with behavioral disturbance  - We will continue the Namenda and  Zyprexa.  Type 2 diabetes mellitus without complications  - We will place the patient on supplemental coverage with NovoLog. - We will continue glipizide and hold off metformin. CBG (last 3)  Recent Labs    06/15/23 1325 06/15/23 1430  GLUCAP 119* 99    Essential hypertension - We will continue antihypertensive therapy.  Anxiety and depression - We will continue BuSpar, Remeron and escitalopram as well as Zyprexa.  Hypokalemia - We will replace potassium and check magnesium level.  Hypothyroidism - We will continue Synthroid.   DVT prophylaxis: SCDs Code Status: Full  Family Communication: bedside updated  Disposition:    Consultants:  orthopedics Procedures:  ORIF 3/14>  Antimicrobials:    Subjective: Nonverbal due to advanced dementia   Objective: Vitals:   06/15/23 1430 06/15/23 1445 06/15/23 1515 06/15/23 1529  BP: 134/60 (!) 145/64 (!) 140/99 (!) 167/66  Pulse: 67 (!) 57 (!) 58   Resp: 12 10 11 16   Temp:   98.7 F (37.1 C) 98.7 F (37.1 C)  TempSrc:    Oral  SpO2: 100% 94% 100% 100%  Weight:        Intake/Output Summary (Last 24 hours) at 06/15/2023 1810 Last data filed at 06/15/2023 1733 Gross per 24 hour  Intake 820.13 ml  Output 500 ml  Net 320.13 ml   Filed Weights   06/14/23 2114  Weight: 52.4 kg   Examination:  General exam: Appears calm and comfortable  Respiratory system: Clear to auscultation. Respiratory effort normal. Cardiovascular system: normal S1 & S2 heard. No JVD, murmurs,  rubs, gallops or clicks. No pedal edema. Gastrointestinal system: Abdomen is nondistended, soft and nontender. No organomegaly or masses felt. Normal bowel sounds heard. Central nervous system: Alert and oriented. No focal neurological deficits. Extremities: shortened right leg Skin: No rashes, lesions or ulcers. Psychiatry: Judgement and insight appear diminished. Mood & affect flat.   Data Reviewed: I have personally reviewed following labs and  imaging studies  CBC: Recent Labs  Lab 06/14/23 1545 06/15/23 0418  WBC 11.6* 9.3  HGB 10.6* 10.6*  HCT 34.2* 34.4*  MCV 85.7 87.5  PLT 200 203    Basic Metabolic Panel: Recent Labs  Lab 06/14/23 1545 06/15/23 0418  NA 138 140  K 3.4* 4.0  CL 102 109  CO2 24 23  GLUCOSE 165* 127*  BUN 24* 22  CREATININE 1.10* 0.79  CALCIUM 9.1 8.8*  MG 1.8  --     CBG: Recent Labs  Lab 06/15/23 1325 06/15/23 1430  GLUCAP 119* 99    Recent Results (from the past 240 hours)  Culture, blood (Routine X 2) w Reflex to ID Panel     Status: None (Preliminary result)   Collection Time: 06/14/23 10:17 PM   Specimen: Left Antecubital; Blood  Result Value Ref Range Status   Specimen Description   Final    LEFT ANTECUBITAL BOTTLES DRAWN AEROBIC AND ANAEROBIC   Special Requests   Final    Blood Culture results may not be optimal due to an inadequate volume of blood received in culture bottles   Culture   Final    NO GROWTH < 12 HOURS Performed at [facility], [address] [place], [place] [address]    Report Status PENDING  Incomplete  Culture, blood (Routine X 2) w Reflex to ID Panel     Status: None (Preliminary result)   Collection Time: 06/14/23 10:17 PM   Specimen: BLOOD LEFT WRIST  Result Value Ref Range Status   Specimen Description   Final    BLOOD LEFT WRIST BOTTLES DRAWN AEROBIC AND ANAEROBIC   Special Requests Blood Culture adequate volume  Final   Culture   Final    NO GROWTH < 12 HOURS Performed at [facility], [address], [place], [place] [address]    Report Status PENDING  Incomplete  Resp panel by RT-PCR (RSV, Flu A&B, Covid) Anterior Nasal Swab     Status: None   Collection Time: 06/14/23 10:20 PM   Specimen: Anterior Nasal Swab  Result Value Ref Range Status   SARS Coronavirus 2 by RT PCR NEGATIVE NEGATIVE Final    Comment: (NOTE) SARS-CoV-2 target nucleic acids are NOT DETECTED.  The SARS-CoV-2 RNA is generally detectable in upper  respiratory specimens during the acute phase of infection. The lowest concentration of SARS-CoV-2 viral copies this assay can detect is 138 copies/mL. A negative result does not preclude SARS-Cov-2 infection and should not be used as the sole basis for treatment or other patient management decisions. A negative result may occur with  improper specimen collection/handling, submission of specimen other than nasopharyngeal swab, presence of viral mutation(s) within the areas targeted by this assay, and inadequate number of viral copies(<138 copies/mL). A negative result must be combined with clinical observations, patient history, and epidemiological information. The expected result is Negative.  Fact Sheet for Patients:  BloggerCourse.com  Fact Sheet for Healthcare Providers:  SeriousBroker.it  This test is no t yet approved or cleared by the [place] FDA and  has been authorized for detection and/or diagnosis  of SARS-CoV-2 by FDA under an Emergency Use Authorization (EUA). This EUA will remain  in effect (meaning this test can be used) for the duration of the COVID-19 declaration under Section 564(b)(1) of the Act, 21 U.S.C.section 360bbb-3(b)(1), unless the authorization is terminated  or revoked sooner.       Influenza A by PCR NEGATIVE NEGATIVE Final   Influenza B by PCR NEGATIVE NEGATIVE Final    Comment: (NOTE) The Xpert Xpress SARS-CoV-2/FLU/RSV plus assay is intended as an aid in the diagnosis of influenza from Nasopharyngeal swab specimens and should not be used as a sole basis for treatment. Nasal washings and aspirates are unacceptable for Xpert Xpress SARS-CoV-2/FLU/RSV testing.  Fact Sheet for Patients: BloggerCourse.com  Fact Sheet for Healthcare Providers: SeriousBroker.it  This test is not yet approved or cleared by the [place] FDA and has been  authorized for detection and/or diagnosis of SARS-CoV-2 by FDA under an Emergency Use Authorization (EUA). This EUA will remain in effect (meaning this test can be used) for the duration of the COVID-19 declaration under Section 564(b)(1) of the Act, 21 U.S.C. section 360bbb-3(b)(1), unless the authorization is terminated or revoked.     Resp Syncytial Virus by PCR NEGATIVE NEGATIVE Final    Comment: (NOTE) Fact Sheet for Patients: BloggerCourse.com  Fact Sheet for Healthcare Providers: SeriousBroker.it  This test is not yet approved or cleared by the [place] FDA and has been authorized for detection and/or diagnosis of SARS-CoV-2 by FDA under an Emergency Use Authorization (EUA). This EUA will remain in effect (meaning this test can be used) for the duration of the COVID-19 declaration under Section 564(b)(1) of the Act, 21 U.S.C. section 360bbb-3(b)(1), unless the authorization is terminated or revoked.  Performed at [facility], [address], [place], [place] [address]   Surgical pcr screen     Status: None   Collection Time: 06/14/23 11:46 PM   Specimen: Nasal Mucosa; Nasal Swab  Result Value Ref Range Status   MRSA, PCR NEGATIVE NEGATIVE Final   Staphylococcus aureus NEGATIVE NEGATIVE Final    Comment: (NOTE) The Xpert SA Assay (FDA approved for NASAL specimens in patients 52 years of age and older), is one component of a comprehensive surveillance program. It is not intended to diagnose infection nor to guide or monitor treatment. Performed at [facility], [address], [place], [place] [address]      Radiology Studies: DG HIP UNILAT WITH PELVIS 2-3 VIEWS RIGHT Result Date: 06/15/2023 CLINICAL DATA:  Closed displaced fracture of right femoral neck, postop. EXAM: DG HIP (WITH OR WITHOUT PELVIS) 2-3V RIGHT COMPARISON:  Preoperative imaging. FINDINGS: Right hip arthroplasty in expected alignment. No  periprosthetic lucency or fracture. Recent postsurgical change includes air and edema in the soft tissues. Lateral skin staples in place. IMPRESSION: Right hip arthroplasty without immediate postoperative complication. Electronically Signed   By: Narda Rutherford M.D.   On: 06/15/2023 15:17   CT Cervical Spine Wo Contrast Result Date: 06/14/2023 CLINICAL DATA:  Neck trauma (Age >= 65y) EXAM: CT CERVICAL SPINE WITHOUT CONTRAST TECHNIQUE: Multidetector CT imaging of the cervical spine was performed without intravenous contrast. Multiplanar CT image reconstructions were also generated. RADIATION DOSE REDUCTION: This exam was performed according to the departmental dose-optimization program which includes automated exposure control, adjustment of the mA and/or kV according to patient size and/or use of iterative reconstruction technique. COMPARISON:  02/08/2023 FINDINGS: Alignment: Straightening of normal lordosis. Unchanged trace anterolisthesis of C3 on C4. No traumatic subluxation. Skull base and  vertebrae: No acute fracture. Vertebral body heights are maintained. The dens and skull base are intact. Soft tissues and spinal canal: No prevertebral fluid or swelling. No visible canal hematoma. Disc levels: Stable degenerative disc disease, most prominent C4-C5 and C6-C7. Upper chest: Emphysema. Other: Carotid calcifications. IMPRESSION: 1. No acute fracture or subluxation of the cervical spine. 2. Stable degenerative disc disease. Electronically Signed   By: Narda Rutherford M.D.   On: 06/14/2023 19:36   CT Head Wo Contrast Result Date: 06/14/2023 CLINICAL DATA:  Head trauma, intracranial arterial injury suspected EXAM: CT HEAD WITHOUT CONTRAST TECHNIQUE: Contiguous axial images were obtained from the base of the skull through the vertex without intravenous contrast. RADIATION DOSE REDUCTION: This exam was performed according to the departmental dose-optimization program which includes automated exposure control,  adjustment of the mA and/or kV according to patient size and/or use of iterative reconstruction technique. COMPARISON:  02/08/2023 FINDINGS: Brain: No intracranial hemorrhage, mass effect, or midline shift. Stable degree of atrophy no hydrocephalus. The basilar cisterns are patent. Unchanged periventricular and deep chronic small vessel ischemia. No evidence of territorial infarct or acute ischemia. No extra-axial or intracranial fluid collection. Vascular: Atherosclerosis of skullbase vasculature without hyperdense vessel or abnormal calcification. Skull: No fracture or focal lesion. Sinuses/Orbits: Paranasal sinuses and mastoid air cells are clear. The visualized orbits are unremarkable. Other: None. IMPRESSION: 1. No acute intracranial abnormality. No skull fracture. 2. Unchanged atrophy and chronic small vessel ischemia. Electronically Signed   By: Narda Rutherford M.D.   On: 06/14/2023 19:31   DG Chest Portable 1 View Result Date: 06/14/2023 CLINICAL DATA:  Hypoxia. EXAM: PORTABLE CHEST 1 VIEW COMPARISON:  10/08/2022 FINDINGS: Lung volumes are low. Stable heart size and mediastinal contours. Mild hypoventilatory changes at the lung bases. Bronchovascular crowding versus vascular congestion. No pneumothorax or large pleural effusion. On limited assessment, no acute osseous findings. IMPRESSION: Low lung volumes with hypoventilatory changes at the lung bases. Bronchovascular crowding versus vascular congestion. Electronically Signed   By: Narda Rutherford M.D.   On: 06/14/2023 17:38   DG HIP UNILAT WITH PELVIS 2-3 VIEWS LEFT Result Date: 06/14/2023 CLINICAL DATA:  Fall, does not want ambulate. EXAM: DG HIP (WITH OR WITHOUT PELVIS) 2-3V RIGHT; DG HIP (WITH OR WITHOUT PELVIS) 2-3V LEFT COMPARISON:  None Available. FINDINGS: Right hip: Displaced right femoral neck fracture. Proximal migration of the femoral shaft. The femoral head remains seated. Left hip: No acute fracture or dislocation. Mild hip joint space  narrowing. No erosive change. Pelvis: No pelvic fracture. Pubic rami are intact. The pubic symphysis and sacroiliac joints are congruent. Mild overlying artifact from clothing. IMPRESSION: 1. Displaced right femoral neck fracture. 2. No acute fracture of the left hip or pelvis. Electronically Signed   By: Narda Rutherford M.D.   On: 06/14/2023 17:37   DG HIP UNILAT WITH PELVIS 2-3 VIEWS RIGHT Result Date: 06/14/2023 CLINICAL DATA:  Fall, does not want ambulate. EXAM: DG HIP (WITH OR WITHOUT PELVIS) 2-3V RIGHT; DG HIP (WITH OR WITHOUT PELVIS) 2-3V LEFT COMPARISON:  None Available. FINDINGS: Right hip: Displaced right femoral neck fracture. Proximal migration of the femoral shaft. The femoral head remains seated. Left hip: No acute fracture or dislocation. Mild hip joint space narrowing. No erosive change. Pelvis: No pelvic fracture. Pubic rami are intact. The pubic symphysis and sacroiliac joints are congruent. Mild overlying artifact from clothing. IMPRESSION: 1. Displaced right femoral neck fracture. 2. No acute fracture of the left hip or pelvis. Electronically Signed   By: Shawna Orleans  Sanford M.D.   On: 06/14/2023 17:37    Scheduled Meds:  busPIRone  5 mg Oral BID   famotidine  20 mg Oral q morning   levothyroxine  50 mcg Oral Q0600   lisinopril  5 mg Oral q morning   melatonin  3 mg Oral QHS   memantine  21 mg Oral q morning   OLANZapine  10 mg Oral QHS   venlafaxine XR  37.5 mg Oral q morning   Continuous Infusions:  sodium chloride 125 mL/hr at 06/15/23 1706    ceFAZolin (ANCEF) IV     cefTRIAXone (ROCEPHIN)  IV 1 g (06/14/23 2219)     LOS: 1 day   Time spent: 52 mins  Thresa Dozier Laural Benes, MD How to contact the [facility] Attending or Consulting provider 7A - 7P or covering provider during after hours 7P -7A, for this patient?  Check the care team in [facility] and look for a) attending/consulting [facility] provider listed and b) the [facility] team listed Log into [email or web address] to find provider on call.  Locate  the [facility] provider you are looking for under [facility] and page to a number that you can be directly reached. If you still have difficulty reaching the provider, please page the [facility] (Director on Call) for the Hospitalists listed on amion for assistance.  06/15/2023, 6:10 PM

## 2023-06-15 NOTE — Hospital Course (Signed)
 78 y.o. female with medical history significant for GERD, anxiety, osteoarthritis, dementia, depression, type 2 diabetes mellitus, essential hypertension, hypothyroidism and seasonal allergies, who presented to the emergency room with acute onset of unwitnessed fall at her ALF likely mechanical fall.  She apparently hit her head but there was no presyncope or syncope documented.  The patient has advanced dementia and therefore is a 9 historian.  No fever or chills.  Her daughter stated that this is her second fall this week.  No nausea or vomiting or abdominal pain were reported.  No fever or chills.  No reported dysuria, oliguria or hematuria or flank pain.  No reported cough or wheezing or dyspnea.   ED Course: When the patient came to the ER, BP was 141/65 and BP 160/79 with otherwise unremarkable vital signs.  Labs revealed mild hypokalemia of 3.4 and blood glucose of 165, BUN of 24 and creatinine 1.1.  CBC showed leukocytosis of 11.6 and hemoglobin 10.6 hematocrit 34.2 lower than previous levels.     EKG as reviewed by me : EKG showed normal sinus rhythm with a rate of 73 with minimal voltage criteria for LVH. Imaging: Pelvic and hip x-ray showed displaced right femoral neck fracture.Portable chest x-ray showed low lung volumes and bronchovascular crowding versus vascular congestion.   The patient will be admitted to a telemetry bed for further evaluation and management.

## 2023-06-15 NOTE — Transfer of Care (Signed)
 Immediate Anesthesia Transfer of Care Note  Patient: Paula Andrews  Procedure(s) Performed: ARTHROPLASTY, HIP, TOTAL,POSTERIOR APPROACH (Right: Hip)  Patient Location: PACU  Anesthesia Type:Spinal  Level of Consciousness: drowsy  Airway & Oxygen Therapy: Patient Spontanous Breathing and Patient connected to face mask oxygen  Post-op Assessment: Report given to RN and Post -op Vital signs reviewed and stable  Post vital signs: Reviewed and stable  Last Vitals:  Vitals Value Taken Time  BP 119/57   Temp 97.7   Pulse 66 06/15/23 1423  Resp 16   SpO2 100 % 06/15/23 1423  Vitals shown include unfiled device data.  Last Pain:  Vitals:   06/15/23 1039  TempSrc: Oral  PainSc:          Complications: No notable events documented.

## 2023-06-15 NOTE — Op Note (Signed)
 Orthopaedic Surgery Operative Note (CSN: [id number])  Paula Andrews  02-28-1946 Date of Surgery: 06/14/2023 - 06/15/2023   Diagnoses:  Right femoral neck fracture  Procedure: Right hip hemiarthroplasty   Operative Finding Successful completion of the planned procedure.    Post-Op Diagnosis: Same Surgeons:Primary: Oliver Barre, MD Assistants: Cecile Sheerer Location: [facility] OR ROOM 4 Anesthesia: Sedation plus regional anesthesia Antibiotics: Ancef 2 g with local vancomycin powder 1 g at the surgical site Tourniquet time: N/A Estimated Blood Loss: 250 cc Complications: None Specimens: None  Implants: Implant Name Type Inv. Item Serial No. Manufacturer Lot No. LRB No. Used Action  STEM FEM ACTIS STD SZ7 - [id number] Nail STEM FEM ACTIS STD SZ7  [employer] [id number] Right 1 Implanted  BIPOLAR PROS AML 46 - [id number] Hips BIPOLAR PROS AML 46  [employer]PU[employer] ORTH[employer] [id number] Right 1 Implanted  HEAD FEM STD 28X+1.5 STRL - [id number] Hips HEAD FEM STD 28X+1.5 [employer]olayne [employer]aines [employer] [id number] Right 1 Implanted    Indications for Surgery:   Paula Andrews is a 78 y.o. female who sustained a displaced Right femoral neck fracture.  In order to restore previous form and function, I recommended a hip hemiarthroplasty.  Risks and benefits of operative and nonoperative management were discussed prior to surgery with the patient's family and informed consent form was completed.  Specific risks including infection, need for additional surgery, bleeding, damage to surrounding structures, dislocation, nonunion of the implants and more severe complications associated with anesthesia.  The patient's family elected to proceed.    Procedure:   The patient was identified properly. Informed consent was obtained and the surgical site was marked. The patient was taken to the OR where general anesthesia was induced.  The patient was positioned lateral with the right side up.  The right hip was prepped and  draped in the usual sterile fashion.  Timeout was performed before the beginning of the case.  Ancef dosing was confirmed prior to incision.  Patient received 1 g of TXA before the start of the case  We started by making an incision centered over the greater trochanter with a scalpel. We used the scalpel to continue to dissect to the fascia.  Electrocautery was used to help with hemostasis.  A cobb elevator was used to clear the IT band and then it was pierced with Bovie electrocautery. Mayo scissors were used to cut the fascia in a longitudinal fashion. The gluteus maximus fibers were bluntly split in line with their fibers.   The femur was slowly internally rotated, putting tension on the posterior structures. The short external rotators were identified and tagged with #1 vicryl.  Bovie electrocautery was used to dissect the short external rotators off of the insertion onto the femur.  Next, we identified the capsule and made a T capsulotomy.  The capsule was then tagged with #1 Vicryl sutures.   Next, we visualized the femoral neck fracture. We identified the sciatic nerve by palpation and verified that it was not in danger during the dissection.    We then made a neck cut approximately 1 cm above the lesser trochanter with a saw. We removed the femoral head. We used the box cutter to cut away some of the greater trochanter for ease of insertion of the stem. We then used the canal finder to locate the femoral canal.   Using the angle of the femoral neck as our guide for version, we broached sequentially. We trialed components and found appropriate fit and  stability.  The patient's bone was stable enough to get an excellent pressfit with the stem.  We placed an appropriately sized head on the stem.  The hip was reduced and we noted full extension of the hip. The hip was stable at 90 degrees of flexion, and 45 degrees of internal rotation.  Leg lengths were approximately equal.  We then removed the trials  as well as the broach. We ensured there were no foreign bodies or bone within the acetabulum. We copiously irrigated the wound.    The appropriately sized stem was then opened and this was pressfit within the canal.  We achieved excellent fit and the stem was stable.  An appropriately sized head was placed on to the stem and the hip was reduced. Again, we noted it was stable in the previously mentioned manipulations.   The wound was copiously irrigated again.  We place 1 g of Vancomycin powder within the hip.  Next,  we repaired the posterior capsule.  The short external rotators were repaired to the greater trochanter through bone tunnels. We closed the fascia of the iliotibial band and gluteus maximus with running and intterupted Vicryl sutures. We then closed Scarpa's fascia with Vicryl sutures. Skin was closed with 2-0 monocryl and staples and an Aquacel dressing was placed.  A hib abduction pillow was secured.  Patient was awoken taken to PACU in stable condition.   Post-operative plan:  The patient will be WBAT on the operative extremity. Hip abduction pillow in place at all times when in bed Posterior hip precautions PT/OT    DVT prophylaxis per primary team, no orthopedic contraindications.    Pain control with PRN pain medication preferring oral medicines.   Follow up plan will be scheduled in approximately 14 days for incision check and XR.

## 2023-06-15 NOTE — Progress Notes (Signed)
   06/15/23 0350  Complete When Order for Surgery Written (All Areas)  ID Verified and ID Band Applied Yes  Ordered ERAS meds (Tylenol, Celebrex, Gabapentin) given? None ordered  Was patient on a Beta Blocker prior to hospital arrival? No  Does the patient have diabetes? Type II  Complete When Order for Surgery Written:  Pre-op Surgical PCR Testing (All Areas )  PCR Swab Performed Yes  PCR Swab Date 06/14/23  RN PERFORMING Vashti Hey, RN  Patient Educated? Yes  Swab Result Positive? No  Complete Within 24 Hours of Surgery (All Areas)  CHG  Bath Completed Yes   CHG Cloths/Bath/Shower Completed 1 night prior;Day of surgery  Skin Prep per Physician Order? CHG  For Patients with FOLEY CATHETERS:  Complete Within One Hour of Surgery (All Areas)  Hygiene Peri care  NPO (All Areas)  NPO past Midnight Yes  Date of last liquid 06/14/23  Time of last liquid 2330  Date of last solid 06/14/23  Time of last solid 2312  Patient belongings (All Areas)  Glasses,Contacts, Jewelry, Hairpins/Clips, Clothing, etc. Removed Yes  Patient/Family advised about valuables policy? Yes  Home Medications No meds brought to hospital  Patient Belongings Kept at bedside  Belongings at Bedside Clothing

## 2023-06-15 NOTE — Anesthesia Postprocedure Evaluation (Signed)
 Anesthesia Post Note  Patient: Paula Andrews  Procedure(s) Performed: ARTHROPLASTY, HIP, TOTAL,POSTERIOR APPROACH (Right: Hip)  Patient location during evaluation: Phase II Anesthesia Type: Spinal Level of consciousness: awake Pain management: pain level controlled Vital Signs Assessment: post-procedure vital signs reviewed and stable Respiratory status: spontaneous breathing and respiratory function stable Cardiovascular status: blood pressure returned to baseline and stable Postop Assessment: no headache and no apparent nausea or vomiting Anesthetic complications: no Comments: Late entry   No notable events documented.   Last Vitals:  Vitals:   06/15/23 1144 06/15/23 1426  BP: (!) 151/76   Pulse:    Resp: 14   Temp:  36.5 C  SpO2:      Last Pain:  Vitals:   06/15/23 1039  TempSrc: Oral  PainSc:                  Windell Norfolk

## 2023-06-15 NOTE — Consult Note (Signed)
 ORTHOPAEDIC CONSULTATION  REQUESTING PHYSICIAN: Cleora Fleet, MD  ASSESSMENT AND PLAN: 78 y.o. female with the following: Right Hip Displaced femoral neck fracture  This patient requires inpatient admission to the hospitalist, to include preoperative clearance and perioperative medical management  - Weight Bearing Status/Activity: NWB Right lower extremity  - Additional recommended labs/tests: Preop Labs: CBC, BMP, PT/INR, Chest XR, and EKG  -VTE Prophylaxis: Please hold prior to OR; to resume POD#1 at the discretion of the primary team  - Pain control: Recommend PO pain medications PRN; judicious use of narcotics  - Follow-up plan: F/u 10-14 days postop  -Procedures: Plan for OR once patient has been medically optimized  Plan for Right Hip hemiarthroplasty   NPO - OR today  Chief Complaint: Right hip pain  HPI: Paula Andrews is a 78 y.o. female who presented to the ED for evaluation after sustaining a mechanical fall.  She has advanced dementia and lives in a nursing facility.  She reportedly fell multiple times over the past couple of days.  She was unable to ambulate.  She was brought to the ED.  No other injuries. Family available in the room this morning to answer questions.   Past Medical History:  Diagnosis Date   Acid reflux    Agitation due to dementia (HCC)    Anxiety    Arthritis    Dementia (HCC)    Depression    Diabetes mellitus    Hypertension    Psychosis (HCC)    Seasonal allergies    Thyroid disease    Past Surgical History:  Procedure Laterality Date   ABDOMINAL HYSTERECTOMY     Social History   Socioeconomic History   Marital status: Married    Spouse name: Not on file   Number of children: Not on file   Years of education: 9   Highest education level: Not on file  Occupational History    Employer: RETIRED  Tobacco Use   Smoking status: Former    Current packs/day: 0.00    Average packs/day: 1 pack/day for 41.0 years (41.0  ttl pk-yrs)    Types: Cigarettes    Start date: 04/03/1960    Quit date: 04/03/2001    Years since quitting: 22.2   Smokeless tobacco: Never  Vaping Use   Vaping status: Never Used  Substance and Sexual Activity   Alcohol use: No   Drug use: No   Sexual activity: Not Currently  Other Topics Concern   Not on file  Social History Narrative   Not on file   Social Drivers of Health   Financial Resource Strain: Not on file  Food Insecurity: Patient Unable To Answer (06/14/2023)   Hunger Vital Sign    Worried About Running Out of Food in the Last Year: Patient unable to answer    Ran Out of Food in the Last Year: Patient unable to answer  Transportation Needs: Patient Unable To Answer (06/14/2023)   PRAPARE - Transportation    Lack of Transportation (Medical): Patient unable to answer    Lack of Transportation (Non-Medical): Patient unable to answer  Physical Activity: Not on file  Stress: Not on file  Social Connections: Patient Unable To Answer (06/14/2023)   Social Connection and Isolation Panel [NHANES]    Frequency of Communication with Friends and Family: Patient unable to answer    Frequency of Social Gatherings with Friends and Family: Patient unable to answer    Attends Religious Services: Patient unable to answer  Active Member of Clubs or Organizations: Patient unable to answer    Attends Banker Meetings: Patient unable to answer    Marital Status: Patient unable to answer   Family History  Problem Relation Age of Onset   Diabetes Other    No Known Allergies Prior to Admission medications   Medication Sig Start Date End Date Taking? Authorizing Provider  acetaminophen (TYLENOL) 325 MG tablet Take 2 tablets (650 mg total) by mouth 2 (two) times daily as needed for mild pain or moderate pain. 11/28/19   Blane Ohara, MD  alum & mag hydroxide-simeth (MI-ACID) 200-200-20 MG/5ML suspension Take 30 mLs by mouth every 6 (six) hours as needed for indigestion or  heartburn.    [provider]  busPIRone (BUSPAR) 5 MG tablet Take 5 mg by mouth 2 (two) times daily. 8am, 5pm 08/04/21   [provider]  famotidine (PEPCID) 20 MG tablet Take 20 mg by mouth every morning. 08/04/21   [provider]  levothyroxine (SYNTHROID) 50 MCG tablet Take 50 mcg by mouth daily at 6 (six) AM. 08/04/21   [provider]  lisinopril (ZESTRIL) 5 MG tablet Take 1 tablet (5 mg total) by mouth daily. Patient taking differently: Take 5 mg by mouth every morning. 11/28/19   Blane Ohara, MD  loperamide (IMODIUM) 2 MG capsule Take 2 mg by mouth as needed for diarrhea or loose stools (max 8 doses in 24 hours).    [provider]  magnesium hydroxide (MILK OF MAGNESIA) 400 MG/5ML suspension Take 30 mLs by mouth at bedtime as needed (constipation).    [provider]  melatonin 1 MG TABS tablet Take 1 mg by mouth at bedtime.    [provider]  memantine (NAMENDA XR) 21 MG CP24 24 hr capsule Take 1 capsule (21 mg total) by mouth daily. Patient taking differently: Take 21 mg by mouth every morning. 11/28/19   Blane Ohara, MD  metFORMIN (GLUCOPHAGE) 500 MG tablet Take 1 tablet (500 mg total) by mouth daily as needed (for high blood sugars). Patient taking differently: Take 500 mg by mouth 2 (two) times daily with a meal. 11/28/19   Blane Ohara, MD  OLANZapine (ZYPREXA) 2.5 MG tablet Take 2.5 mg by mouth daily at 2 PM. 08/04/21   [provider]  venlafaxine XR (EFFEXOR-XR) 37.5 MG 24 hr capsule Take 37.5 mg by mouth every morning. 08/04/21   [provider]  Vitamin D, Cholecalciferol, 10 MCG (400 UNIT) TABS Take 800 Units by mouth every morning.    [provider]   CT Cervical Spine Wo Contrast Result Date: 06/14/2023 CLINICAL DATA:  Neck trauma (Age >= 65y) EXAM: CT CERVICAL SPINE WITHOUT CONTRAST TECHNIQUE: Multidetector CT imaging of the cervical spine was performed without intravenous contrast.  Multiplanar CT image reconstructions were also generated. RADIATION DOSE REDUCTION: This exam was performed according to the departmental dose-optimization program which includes automated exposure control, adjustment of the mA and/or kV according to patient size and/or use of iterative reconstruction technique. COMPARISON:  02/08/2023 FINDINGS: Alignment: Straightening of normal lordosis. Unchanged trace anterolisthesis of C3 on C4. No traumatic subluxation. Skull base and vertebrae: No acute fracture. Vertebral body heights are maintained. The dens and skull base are intact. Soft tissues and spinal canal: No prevertebral fluid or swelling. No visible canal hematoma. Disc levels: Stable degenerative disc disease, most prominent C4-C5 and C6-C7. Upper chest: Emphysema. Other: Carotid calcifications. IMPRESSION: 1. No acute fracture or subluxation of the cervical spine. 2.  Stable degenerative disc disease. Electronically Signed   By: Narda Rutherford M.D.   On: 06/14/2023 19:36   CT Head Wo Contrast Result Date: 06/14/2023 CLINICAL DATA:  Head trauma, intracranial arterial injury suspected EXAM: CT HEAD WITHOUT CONTRAST TECHNIQUE: Contiguous axial images were obtained from the base of the skull through the vertex without intravenous contrast. RADIATION DOSE REDUCTION: This exam was performed according to the departmental dose-optimization program which includes automated exposure control, adjustment of the mA and/or kV according to patient size and/or use of iterative reconstruction technique. COMPARISON:  02/08/2023 FINDINGS: Brain: No intracranial hemorrhage, mass effect, or midline shift. Stable degree of atrophy no hydrocephalus. The basilar cisterns are patent. Unchanged periventricular and deep chronic small vessel ischemia. No evidence of territorial infarct or acute ischemia. No extra-axial or intracranial fluid collection. Vascular: Atherosclerosis of skullbase vasculature without hyperdense vessel or  abnormal calcification. Skull: No fracture or focal lesion. Sinuses/Orbits: Paranasal sinuses and mastoid air cells are clear. The visualized orbits are unremarkable. Other: None. IMPRESSION: 1. No acute intracranial abnormality. No skull fracture. 2. Unchanged atrophy and chronic small vessel ischemia. Electronically Signed   By: Narda Rutherford M.D.   On: 06/14/2023 19:31   DG Chest Portable 1 View Result Date: 06/14/2023 CLINICAL DATA:  Hypoxia. EXAM: PORTABLE CHEST 1 VIEW COMPARISON:  10/08/2022 FINDINGS: Lung volumes are low. Stable heart size and mediastinal contours. Mild hypoventilatory changes at the lung bases. Bronchovascular crowding versus vascular congestion. No pneumothorax or large pleural effusion. On limited assessment, no acute osseous findings. IMPRESSION: Low lung volumes with hypoventilatory changes at the lung bases. Bronchovascular crowding versus vascular congestion. Electronically Signed   By: Narda Rutherford M.D.   On: 06/14/2023 17:38   DG HIP UNILAT WITH PELVIS 2-3 VIEWS LEFT Result Date: 06/14/2023 CLINICAL DATA:  Fall, does not want ambulate. EXAM: DG HIP (WITH OR WITHOUT PELVIS) 2-3V RIGHT; DG HIP (WITH OR WITHOUT PELVIS) 2-3V LEFT COMPARISON:  None Available. FINDINGS: Right hip: Displaced right femoral neck fracture. Proximal migration of the femoral shaft. The femoral head remains seated. Left hip: No acute fracture or dislocation. Mild hip joint space narrowing. No erosive change. Pelvis: No pelvic fracture. Pubic rami are intact. The pubic symphysis and sacroiliac joints are congruent. Mild overlying artifact from clothing. IMPRESSION: 1. Displaced right femoral neck fracture. 2. No acute fracture of the left hip or pelvis. Electronically Signed   By: Narda Rutherford M.D.   On: 06/14/2023 17:37   DG HIP UNILAT WITH PELVIS 2-3 VIEWS RIGHT Result Date: 06/14/2023 CLINICAL DATA:  Fall, does not want ambulate. EXAM: DG HIP (WITH OR WITHOUT PELVIS) 2-3V RIGHT; DG HIP (WITH  OR WITHOUT PELVIS) 2-3V LEFT COMPARISON:  None Available. FINDINGS: Right hip: Displaced right femoral neck fracture. Proximal migration of the femoral shaft. The femoral head remains seated. Left hip: No acute fracture or dislocation. Mild hip joint space narrowing. No erosive change. Pelvis: No pelvic fracture. Pubic rami are intact. The pubic symphysis and sacroiliac joints are congruent. Mild overlying artifact from clothing. IMPRESSION: 1. Displaced right femoral neck fracture. 2. No acute fracture of the left hip or pelvis. Electronically Signed   By: Narda Rutherford M.D.   On: 06/14/2023 17:37   Family History Reviewed and non-contributory, no pertinent history of problems with bleeding or anesthesia    Review of Systems Unable to assess    OBJECTIVE  Vitals:Patient Vitals for the past 8 hrs:  BP Temp Temp src Pulse Resp SpO2  06/15/23 0131 (!) 151/77 98.1  F (36.7 C) Oral (!) 58 16 94 %   General: Resting.  Does not respond to questions Cardiovascular: Extremities are warm Respiratory: No cyanosis, no use of accessory musculature Skin: No lesions in the area of chief complaint  Neurologic: reacts to light tough  Psychiatric: Does not answer questions Lymphatic: No swelling obvious and reported other than the area involved in the exam below Extremities  RLE: Extremity held in a fixed position.  ROM deferred due to known fracture.  Reacts to light touch. 2+ DP pulse.  Toes are WWP.  Active motion intact in the TA/EHL/GS.  Test Results Imaging XR of the Right hip demonstrates a Displaced femoral neck fracture.  Labs cbc Recent Labs    06/14/23 1545 06/15/23 0418  WBC 11.6* 9.3  HGB 10.6* 10.6*  HCT 34.2* 34.4*  PLT 200 203      Recent Labs    06/14/23 1545 06/15/23 0418  NA 138 140  K 3.4* 4.0  CL 102 109  CO2 24 23  GLUCOSE 165* 127*  BUN 24* 22  CREATININE 1.10* 0.79  CALCIUM 9.1 8.8*

## 2023-06-15 NOTE — Anesthesia Procedure Notes (Addendum)
 Date/Time: 06/15/2023 12:19 PM  Performed by: Windell Norfolk, MDPre-anesthesia Checklist: Patient identified, Emergency Drugs available, Suction available and Patient being monitored Patient Re-evaluated:Patient Re-evaluated prior to induction Oxygen Delivery Method: Simple face mask Induction Type: IV induction Placement Confirmation: positive ETCO2

## 2023-06-16 DIAGNOSIS — I10 Essential (primary) hypertension: Secondary | ICD-10-CM | POA: Diagnosis not present

## 2023-06-16 DIAGNOSIS — F03918 Unspecified dementia, unspecified severity, with other behavioral disturbance: Secondary | ICD-10-CM | POA: Diagnosis not present

## 2023-06-16 DIAGNOSIS — F419 Anxiety disorder, unspecified: Secondary | ICD-10-CM | POA: Diagnosis not present

## 2023-06-16 DIAGNOSIS — S72001A Fracture of unspecified part of neck of right femur, initial encounter for closed fracture: Secondary | ICD-10-CM | POA: Diagnosis not present

## 2023-06-16 LAB — CBC
HCT: 29.8 % — ABNORMAL LOW (ref 36.0–46.0)
Hemoglobin: 8.9 g/dL — ABNORMAL LOW (ref 12.0–15.0)
MCH: 26.9 pg (ref 26.0–34.0)
MCHC: 29.9 g/dL — ABNORMAL LOW (ref 30.0–36.0)
MCV: 90 fL (ref 80.0–100.0)
Platelets: 167 10*3/uL (ref 150–400)
RBC: 3.31 MIL/uL — ABNORMAL LOW (ref 3.87–5.11)
RDW: 16 % — ABNORMAL HIGH (ref 11.5–15.5)
WBC: 8.4 10*3/uL (ref 4.0–10.5)
nRBC: 0 % (ref 0.0–0.2)

## 2023-06-16 LAB — BASIC METABOLIC PANEL
Anion gap: 8 (ref 5–15)
BUN: 22 mg/dL (ref 8–23)
CO2: 22 mmol/L (ref 22–32)
Calcium: 8.4 mg/dL — ABNORMAL LOW (ref 8.9–10.3)
Chloride: 109 mmol/L (ref 98–111)
Creatinine, Ser: 0.91 mg/dL (ref 0.44–1.00)
GFR, Estimated: >60 mL/min (ref >60)
Glucose, Bld: 157 mg/dL — ABNORMAL HIGH (ref 70–99)
Potassium: 4 mmol/L (ref 3.5–5.1)
Sodium: 139 mmol/L (ref 135–145)

## 2023-06-16 MED ORDER — ENSURE ENLIVE PO LIQD
237.0000 mL | Freq: Two times a day (BID) | ORAL | Status: DC
  Administered 2023-06-17 – 2023-06-19 (×6): 237 mL via ORAL

## 2023-06-16 MED ORDER — ORAL CARE MOUTH RINSE: 15.0000 mL | OROMUCOSAL | Status: DC | PRN

## 2023-06-16 MED ORDER — HEPARIN SODIUM (PORCINE) 5000 UNIT/ML IJ SOLN
5000.0000 [IU] | Freq: Three times a day (TID) | INTRAMUSCULAR | Status: AC
  Administered 2023-06-16 – 2023-06-17 (×4): 5000 [IU] via SUBCUTANEOUS
  Filled 2023-06-16 (×4): qty 1

## 2023-06-16 NOTE — Plan of Care (Signed)
  Problem: Pain Managment: Goal: General experience of comfort will improve and/or be controlled Outcome: Progressing   Problem: Safety: Goal: Ability to remain free from injury will improve Outcome: Progressing   Problem: Skin Integrity: Goal: Risk for impaired skin integrity will decrease Outcome: Progressing

## 2023-06-16 NOTE — TOC Progression Note (Signed)
 Transition of Care [facility]) - Progression Note    Patient Details  Name: Paula Andrews MRN: [id number] Date of Birth: 12-Aug-1945  [facility] CM/SW Contact  Catalina Gravel, [place] Phone Number: 06/16/2023, 5:28 PM  Clinical Narrative:    CSW sent secure text to [facility] representative, Rae Halsted- to advise SNF recommended for rehab and if they can assist with pt education regarding options, since active with home Hospice.  TOC following.  Addendum:  [facility] has responded- Rae Halsted is not their employee- but they are following up with pt.  CSW sent compliant text to [facility]  to disregard text as  pt is not with [facility]- no pt specific information included.    Barriers to Discharge: Continued Medical Work up  Expected Discharge Plan and Services                                               Social Determinants of Health (SDOH) Interventions SDOH Screenings   Food Insecurity: Patient Unable To Answer (06/14/2023)  Housing: Patient Unable To Answer (06/14/2023)  Transportation Needs: Patient Unable To Answer (06/14/2023)  Utilities: Patient Unable To Answer (06/14/2023)  Social Connections: Patient Unable To Answer (06/14/2023)  Tobacco Use: Medium Risk (06/15/2023)    Readmission Risk Interventions    09/20/2021    3:04 PM  Readmission Risk Prevention Plan  Transportation Screening Complete  PCP or Specialist Appt within 5-7 Days Complete  Home Care Screening Complete  Medication Review (RN CM) Complete

## 2023-06-16 NOTE — Evaluation (Signed)
 Physical Therapy Evaluation Patient Details Name: Paula Andrews MRN: [id number] DOB: [birth year] Today's Date: 06/16/2023  History of Present Illness  Per ER note       History     Chief Complaint  Patient presents with    Paula Andrews        Paula Andrews is a 78 y.o. female.     78 year old female past medical history of advanced dementia presenting to the emergency department today after she has stopped walking at her nursing facility.  The patient apparently had a fall last week and has been refusing to walk since.  She is denying any pain or specific complaints at this time.   Pt was found to have a hip fx which had surgical intervention  yesterday.  PT is WBAT.  Clinical Impression  Pt non verbal, does not respond to command.  Pt kept eyes shut and did not assist in movement of extremities.  Once sitting on edge of bed pt opened her eyes and was able to keep sitting balance for five minutes.        If plan is discharge home, recommend the following: Two people to help with walking and/or transfers;A lot of help with bathing/dressing/bathroom;Assistance with cooking/housework;Direct supervision/assist for medications management;Assist for transportation   Can travel by private vehicle   No    Equipment Recommendations None recommended by PT  Recommendations for Other Services    none   Functional Status Assessment Patient has had a recent decline in their functional status and/or demonstrates limited ability to make significant improvements in function in a reasonable and predictable amount of time     Precautions / Restrictions Precautions Precautions: Fall Precaution/Restrictions Comments: Posterior hip precaution Restrictions Weight Bearing Restrictions Per Provider Order: Yes RLE Weight Bearing Per Provider Order: Weight bearing as tolerated      Mobility  Bed Mobility Overal bed mobility: Needs Assistance Bed Mobility: Supine to Sit, Sit to Supine     Supine to sit: Max  assist Sit to supine: Max assist   General bed mobility comments: once sitting pt does open her eyes and is able to sit without assist x 5 minutes    Transfers Overall transfer level:  (unable at this time)                   Balance  Sitting balance is fair                                           Pertinent Vitals/Pain Pain Assessment Pain Assessment: Faces Faces Pain Scale: Hurts little more    Home Living Family/patient expects to be discharged to:: Assisted living                 Home Equipment: Other (comment) Additional Comments: unknown pt is not verbal no visitors in room    Prior Function Prior Level of Function : Needs assist             Mobility Comments: over a year ago pt spuse stated that pt was ambulatory witout AD at ALF       Extremity/Trunk Assessment        Lower Extremity Assessment Lower Extremity Assessment: RLE deficits/detail;LLE deficits/detail;Generalized weakness RLE Deficits / Details: Pt ankle ROM less than 50%, limited hip mobility unable to assess strength LLE Deficits / Details: Pt ankle ROM less than 50%,  limited hip mobility unable to assess strength       Communication        Cognition Arousal: Lethargic Behavior During Therapy: Flat affect   PT - Cognitive impairments: History of cognitive impairments                         Following commands: Impaired Following commands impaired: Follows one step commands inconsistently     Cueing Cueing Techniques: Verbal cues, Tactile cues     General Comments      Exercises General Exercises - Upper Extremity Shoulder Flexion: PROM (shoulder flexion and abductin are limited greater than 50%) General Exercises - Lower Extremity Ankle Circles/Pumps: PROM (B limited greater than 50%)   Assessment/Plan    PT Assessment Patient needs continued PT services  PT Problem List Decreased strength;Decreased range of motion;Decreased  activity tolerance;Decreased mobility;Pain       PT Treatment Interventions Therapeutic activities;Functional mobility training    PT Goals (Current goals can be found in the Care Plan section)  Acute Rehab PT Goals PT Goal Formulation: Patient unable to participate in goal setting Time For Goal Achievement: 06/21/23 Potential to Achieve Goals: Fair    Frequency Min 3X/week     Co-evaluation               AM-PAC PT "6 Clicks" Mobility  Outcome Measure Help needed turning from your back to your side while in a flat bed without using bedrails?: Total Help needed moving from lying on your back to sitting on the side of a flat bed without using bedrails?: Total Help needed moving to and from a bed to a chair (including a wheelchair)?: Total Help needed standing up from a chair using your arms (e.g., wheelchair or bedside chair)?: Total Help needed to walk in hospital room?: Total Help needed climbing 3-5 steps with a railing? : Total 6 Click Score: 6    End of Session   Activity Tolerance: Patient limited by lethargy Patient left: in bed;with bed alarm set   PT Visit Diagnosis: Other abnormalities of gait and mobility (R26.89);History of falling (Z91.81);Muscle weakness (generalized) (M62.81);Difficulty in walking, not elsewhere classified (R26.2)    Time: 1610-9604 PT Time Calculation (min) (ACUTE ONLY): 32 min   Charges:   PT Evaluation $PT Eval Low Complexity: 1 Low PT Treatments $Therapeutic Activity: 8-22 mins PT General Charges $$ ACUTE PT VISIT: 1 Visit        Virgina Organ, PT CLT [phone]  06/16/2023, 11:38 AM

## 2023-06-16 NOTE — Progress Notes (Signed)
 PROGRESS NOTE   Paula Andrews  NGE:[id number] DOB: May 22, 1945 DOA: 06/14/2023 PCP: Merlene Laughter, MD (Inactive)   Chief Complaint  Patient presents with   Fall   Level of care: Telemetry  Brief Admission History:  78 y.o. female with medical history significant for GERD, anxiety, osteoarthritis, dementia, depression, type 2 diabetes mellitus, essential hypertension, hypothyroidism and seasonal allergies, who presented to the emergency room with acute onset of unwitnessed fall at her ALF likely mechanical fall.  She apparently hit her head but there was no presyncope or syncope documented.  The patient has advanced dementia and therefore is a 9 historian.  No fever or chills.  Her daughter stated that this is her second fall this week.  No nausea or vomiting or abdominal pain were reported.  No fever or chills.  No reported dysuria, oliguria or hematuria or flank pain.  No reported cough or wheezing or dyspnea.   ED Course: When the patient came to the ER, BP was 141/65 and BP 160/79 with otherwise unremarkable vital signs.  Labs revealed mild hypokalemia of 3.4 and blood glucose of 165, BUN of 24 and creatinine 1.1.  CBC showed leukocytosis of 11.6 and hemoglobin 10.6 hematocrit 34.2 lower than previous levels.     EKG as reviewed by me : EKG showed normal sinus rhythm with a rate of 73 with minimal voltage criteria for LVH. Imaging: Pelvic and hip x-ray showed displaced right femoral neck fracture.Portable chest x-ray showed low lung volumes and bronchovascular crowding versus vascular congestion.   The patient will be admitted to a telemetry bed for further evaluation and management.   Assessment and Plan: Closed right hip fracture s/p ORIF 3/14 with Dr. Dallas Schimke - Pain management will be provided. - Orthopedic consult to be obtained. - Dr. Dallas Schimke postop orders in place -- POD#1 pt not taking oral medication, added subcutaneous heparin for DVT prevention until able to take oral aspirin    Dementia with behavioral disturbance  - continue the Namenda and Zyprexa.  Type 2 diabetes mellitus without complications  - We will place the patient on supplemental coverage with NovoLog. - We will continue glipizide and hold off metformin. CBG (last 3)  Recent Labs    06/15/23 1325 06/15/23 1430  GLUCAP 119* 99    Essential hypertension - ontinue antihypertensive therapy.  Anxiety and depression - continue BuSpar, Remeron and escitalopram as well as Zyprexa if able to take oral  Hypokalemia - Repleted   Hypothyroidism - Continue Synthroid.   DVT prophylaxis: SCDs Code Status: Full  Family Communication: bedside updated  Disposition:    Consultants:  orthopedics Procedures:  ORIF 3/14>  Antimicrobials:    Subjective: Pt is nonverbal due to advanced dementia.   Objective: Vitals:   06/15/23 2352 06/16/23 0437 06/16/23 1100 06/16/23 1252  BP: (!) 144/96 (!) 120/48  136/61  Pulse: 83 81  80  Resp: 18 (!) 22  20  Temp: 98.4 F (36.9 C) 98.3 F (36.8 C)  98.7 F (37.1 C)  TempSrc: Oral Oral  Oral  SpO2: 100% 100%  100%  Weight:      Height:   5\' 4"  (1.626 m)     Intake/Output Summary (Last 24 hours) at 06/16/2023 1650 Last data filed at 06/16/2023 1556 Gross per 24 hour  Intake 463.49 ml  Output 350 ml  Net 113.49 ml   Filed Weights   06/14/23 2114  Weight: 52.4 kg   Examination:  General exam: Appears calm and comfortable she is nonverbal  with advanced dementia. Respiratory system: Clear to auscultation. Respiratory effort normal. Cardiovascular system: normal S1 & S2 heard. No JVD, murmurs, rubs, gallops or clicks. No pedal edema. Gastrointestinal system: Abdomen is nondistended, soft and nontender. No organomegaly or masses felt. Normal bowel sounds heard. Central nervous system: Alert and oriented. No focal neurological deficits. Extremities: wounds clean, dry and intact. Skin: No rashes, lesions or ulcers. Psychiatry: Judgement and  insight appear severely diminished. Mood & affect flat.   Data Reviewed: I have personally reviewed following labs and imaging studies  CBC: Recent Labs  Lab 06/14/23 1545 06/15/23 0418 06/16/23 0329  WBC 11.6* 9.3 8.4  HGB 10.6* 10.6* 8.9*  HCT 34.2* 34.4* 29.8*  MCV 85.7 87.5 90.0  PLT 200 203 167    Basic Metabolic Panel: Recent Labs  Lab 06/14/23 1545 06/15/23 0418 06/16/23 0329  NA 138 140 139  K 3.4* 4.0 4.0  CL 102 109 109  CO2 24 23 22   GLUCOSE 165* 127* 157*  BUN 24* 22 22  CREATININE 1.10* 0.79 0.91  CALCIUM 9.1 8.8* 8.4*  MG 1.8  --   --     CBG: Recent Labs  Lab 06/15/23 1325 06/15/23 1430  GLUCAP 119* 99    Recent Results (from the past 240 hours)  Culture, blood (Routine X 2) w Reflex to ID Panel     Status: None (Preliminary result)   Collection Time: 06/14/23 10:17 PM   Specimen: Left Antecubital; Blood  Result Value Ref Range Status   Specimen Description   Final    LEFT ANTECUBITAL BOTTLES DRAWN AEROBIC AND ANAEROBIC   Special Requests   Final    Blood Culture results may not be optimal due to an inadequate volume of blood received in culture bottles   Culture   Final    NO GROWTH 2 DAYS Performed at [facility], [address], [place], [place] [address]    Report Status PENDING  Incomplete  Culture, blood (Routine X 2) w Reflex to ID Panel     Status: None (Preliminary result)   Collection Time: 06/14/23 10:17 PM   Specimen: BLOOD LEFT WRIST  Result Value Ref Range Status   Specimen Description   Final    BLOOD LEFT WRIST BOTTLES DRAWN AEROBIC AND ANAEROBIC   Special Requests Blood Culture adequate volume  Final   Culture   Final    NO GROWTH 2 DAYS Performed at [facility], [address], [place], [place] [address]    Report Status PENDING  Incomplete  Resp panel by RT-PCR (RSV, Flu A&B, Covid) Anterior Nasal Swab     Status: None   Collection Time: 06/14/23 10:20 PM   Specimen: Anterior Nasal Swab  Result Value Ref Range  Status   SARS Coronavirus 2 by RT PCR NEGATIVE NEGATIVE Final    Comment: (NOTE) SARS-CoV-2 target nucleic acids are NOT DETECTED.  The SARS-CoV-2 RNA is generally detectable in upper respiratory specimens during the acute phase of infection. The lowest concentration of SARS-CoV-2 viral copies this assay can detect is 138 copies/mL. A negative result does not preclude SARS-Cov-2 infection and should not be used as the sole basis for treatment or other patient management decisions. A negative result may occur with  improper specimen collection/handling, submission of specimen other than nasopharyngeal swab, presence of viral mutation(s) within the areas targeted by this assay, and inadequate number of viral copies(<138 copies/mL). A negative result must be combined with clinical observations, patient history, and epidemiological information. The expected  result is Negative.  Fact Sheet for Patients:  BloggerCourse.com  Fact Sheet for Healthcare Providers:  SeriousBroker.it  This test is no t yet approved or cleared by the [place] FDA and  has been authorized for detection and/or diagnosis of SARS-CoV-2 by FDA under an Emergency Use Authorization (EUA). This EUA will remain  in effect (meaning this test can be used) for the duration of the COVID-19 declaration under Section 564(b)(1) of the Act, 21 U.S.C.section 360bbb-3(b)(1), unless the authorization is terminated  or revoked sooner.       Influenza A by PCR NEGATIVE NEGATIVE Final   Influenza B by PCR NEGATIVE NEGATIVE Final    Comment: (NOTE) The Xpert Xpress SARS-CoV-2/FLU/RSV plus assay is intended as an aid in the diagnosis of influenza from Nasopharyngeal swab specimens and should not be used as a sole basis for treatment. Nasal washings and aspirates are unacceptable for Xpert Xpress SARS-CoV-2/FLU/RSV testing.  Fact Sheet for  Patients: BloggerCourse.com  Fact Sheet for Healthcare Providers: SeriousBroker.it  This test is not yet approved or cleared by the [place] FDA and has been authorized for detection and/or diagnosis of SARS-CoV-2 by FDA under an Emergency Use Authorization (EUA). This EUA will remain in effect (meaning this test can be used) for the duration of the COVID-19 declaration under Section 564(b)(1) of the Act, 21 U.S.C. section 360bbb-3(b)(1), unless the authorization is terminated or revoked.     Resp Syncytial Virus by PCR NEGATIVE NEGATIVE Final    Comment: (NOTE) Fact Sheet for Patients: BloggerCourse.com  Fact Sheet for Healthcare Providers: SeriousBroker.it  This test is not yet approved or cleared by the [place] FDA and has been authorized for detection and/or diagnosis of SARS-CoV-2 by FDA under an Emergency Use Authorization (EUA). This EUA will remain in effect (meaning this test can be used) for the duration of the COVID-19 declaration under Section 564(b)(1) of the Act, 21 U.S.C. section 360bbb-3(b)(1), unless the authorization is terminated or revoked.  Performed at [facility], [address] [place], [place] [address]   Surgical pcr screen     Status: None   Collection Time: 06/14/23 11:46 PM   Specimen: Nasal Mucosa; Nasal Swab  Result Value Ref Range Status   MRSA, PCR NEGATIVE NEGATIVE Final   Staphylococcus aureus NEGATIVE NEGATIVE Final    Comment: (NOTE) The Xpert SA Assay (FDA approved for NASAL specimens in patients 38 years of age and older), is one component of a comprehensive surveillance program. It is not intended to diagnose infection nor to guide or monitor treatment. Performed at [facility], [address] [place], [place] [address]      Radiology Studies: DG HIP UNILAT WITH PELVIS 2-3 VIEWS RIGHT Result Date: 06/15/2023 CLINICAL  DATA:  Closed displaced fracture of right femoral neck, postop. EXAM: DG HIP (WITH OR WITHOUT PELVIS) 2-3V RIGHT COMPARISON:  Preoperative imaging. FINDINGS: Right hip arthroplasty in expected alignment. No periprosthetic lucency or fracture. Recent postsurgical change includes air and edema in the soft tissues. Lateral skin staples in place. IMPRESSION: Right hip arthroplasty without immediate postoperative complication. Electronically Signed   By: Narda Rutherford M.D.   On: 06/15/2023 15:17    Scheduled Meds:  busPIRone  5 mg Oral BID   famotidine  20 mg Oral q morning   feeding supplement  237 mL Oral BID BM   heparin  5,000 Units Subcutaneous Q8H   levothyroxine  50 mcg Oral Q0600   lisinopril  5 mg Oral q morning  melatonin  3 mg Oral QHS   memantine  21 mg Oral q morning   OLANZapine  10 mg Oral QHS   venlafaxine XR  37.5 mg Oral q morning   Continuous Infusions:  cefTRIAXone (ROCEPHIN)  IV Stopped (06/15/23 2327)     LOS: 2 days   Time spent: 50 mins  Helvi Royals Laural Benes, MD How to contact the [facility] Attending or Consulting provider 7A - 7P or covering provider during after hours 7P -7A, for this patient?  Check the care team in [facility] and look for a) attending/consulting TRH provider listed and b) the [facility] team listed Log into [email or web address] to find provider on call.  Locate the [facility] provider you are looking for under [facility] and page to a number that you can be directly reached. If you still have difficulty reaching the provider, please page the [facility] (Director on Call) for the Hospitalists listed on amion for assistance.  06/16/2023, 4:50 PM

## 2023-06-16 NOTE — Plan of Care (Signed)
  Problem: Acute Rehab PT Goals(only PT should resolve) Goal: Pt Will Go Supine/Side To Sit Flowsheets (Taken 06/16/2023 1136) Pt will go Supine/Side to Sit: with maximum assist Goal: Pt Will Go Sit To Supine/Side Flowsheets (Taken 06/16/2023 1136) Pt will go Sit to Supine/Side: with maximum assist Goal: Pt Will Transfer Bed To Chair/Chair To Bed Flowsheets (Taken 06/16/2023 1136) Pt will Transfer Bed to Chair/Chair to Bed:  with max assist  with mod assist Goal: Pt Will Ambulate Flowsheets (Taken 06/16/2023 1136) Pt will Ambulate:  10 feet  with rolling walker  with maximum assist  with moderate assist

## 2023-06-16 NOTE — Care Management Important Message (Signed)
 Important Message  Patient Details  Name: Paula Andrews MRN: [id number] Date of Birth: [birth year]   Important Message Given:  N/A - LOS <3 / Initial given by admissions     Corey Harold 06/16/2023, 10:58 AM

## 2023-06-16 NOTE — Progress Notes (Signed)
 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 is a current [facility] patient with a terminal diagnosis of cerebral atherosclerosis with vascular dementia who resides at [facility]. Patient had a fall a few days prior to hospitalization but continued to ambulate. She began having more pain and was transferred to hospital and admitted on 3.13 with right femoral neck fracture. Per Dr. Patric Dykes with [facility] this is a related hospital admission.   Paula Andrews is resting quietly without sign of pain post surgery. She has required a couple of doses of PO Vicodin since surgery and will likely be seen by physical therapy today for initial evaluation.   Patient is inpatient appropriate with need for post op recovery/monitoring of surgical fixation to right hip yesterday.   Vital Signs- 98.3/67/22     120/48      spO2 100% on 2L nasal cannula      Intake/Output- 1060/700 Abnormal labs- Ca+ 8.4, RBC 3.31, Hgb 8.9, Hct 29.8 Diagnostics-  DG HIP (WITH OR WITHOUT PELVIS) 2-3V RIGHT    IMPRESSION: Right hip arthroplasty without immediate postoperative complication.   Electronically Signed   By: Narda Rutherford M.D.   On: 06/15/2023 15:17       IV/PRN Meds- NS @50ml /H continuous IV, Ancef 2g IV once, Rocephin 1g q24H, LR @10ml /H, TXA 1g IV once, Haldol 1mg  IV x1, Hydralazine 10mg  IV x1, Vicodin 5/325 q6H prn x2, Zofran 4mg  IV once Problem List as per progress note Dr. Standley Dakins 3.14.25 Closed right hip fracture, initial encounter - The patient admitted to a telemetry bed. - Pain management will be provided. - Orthopedic consult to be obtained. - Dr. Dallas Schimke taking to OR today for ORIF    Dementia with behavioral disturbance  - We will continue the Namenda and Zyprexa.   Type 2 diabetes mellitus without complications  - We will place the patient on supplemental coverage with NovoLog. - We will continue glipizide and hold  off metformin. CBG (last 3)             Discharge Planning- Discharge back to facility vs skilled rehab Family Contact- talked with family at bedside IDT- Updated Goals of care - DNR but family desires surgical fixation of fracture Thea Gist, RN, [facility] Liaison [phone]

## 2023-06-16 NOTE — Progress Notes (Signed)
   ORTHOPAEDIC PROGRESS NOTE  s/p Procedure(s): Right hip hemiarthroplasty  DOS: 06/15/2023  SUBJECTIVE: No issues over night.  Patient asleep.  She does not respond to questions.  No family in the room.   OBJECTIVE: PE:  Resting comfortably.  Acknowledges visitors  Dressing is clean, dry and intact Hip abduction pillow in place Toes are warm and well perfused 2+ DP pulse   Vitals:   06/15/23 2352 06/16/23 0437  BP: (!) 144/96 (!) 120/48  Pulse: 83 81  Resp: 18 (!) 22  Temp: 98.4 F (36.9 C) 98.3 F (36.8 C)  SpO2: 100% 100%      Latest Ref Rng & Units 06/16/2023    3:29 AM 06/15/2023    4:18 AM 06/14/2023    3:45 PM  CBC  WBC 4.0 - 10.5 K/uL 8.4  9.3  11.6   Hemoglobin 12.0 - 15.0 g/dL 8.9  16.1  09.6   Hematocrit 36.0 - 46.0 % 29.8  34.4  34.2   Platelets 150 - 400 K/uL 167  203  200      ASSESSMENT: Paula Andrews is a 79 y.o. female stable POD#1  PLAN: Weightbearing: WBAT RLE Incisional and dressing care: Reinforce dressings as needed Orthopedic device(s):  Hip abduction pillow in place at all times when in bed.  VTE prophylaxis: At the discretion of hospitalist, no orthopaedic contraindication.  Recommend 81 mg aspirin BID Pain control: As needed Follow - up plan: 2 weeks for staple removal and XR.  IF DC to a facility, staples can be removed ~2 weeks after surgery, and follow up can be 6 weeks after surgery.  If there are any concerns, I am happy to see her in clinic at any time.    Contact information:     Emmanuella Mirante A. Dallas Schimke, MD MS [facility] [address] [place],  [place]  [address] Phone: [phone] Fax: [phone]

## 2023-06-17 ENCOUNTER — Encounter (HOSPITAL_COMMUNITY): Payer: Self-pay | Admitting: Orthopedic Surgery

## 2023-06-17 DIAGNOSIS — F03918 Unspecified dementia, unspecified severity, with other behavioral disturbance: Secondary | ICD-10-CM | POA: Diagnosis not present

## 2023-06-17 DIAGNOSIS — I10 Essential (primary) hypertension: Secondary | ICD-10-CM | POA: Diagnosis not present

## 2023-06-17 DIAGNOSIS — F419 Anxiety disorder, unspecified: Secondary | ICD-10-CM | POA: Diagnosis not present

## 2023-06-17 DIAGNOSIS — S72001A Fracture of unspecified part of neck of right femur, initial encounter for closed fracture: Secondary | ICD-10-CM | POA: Diagnosis not present

## 2023-06-17 LAB — GLUCOSE, CAPILLARY: Glucose-Capillary: 143 mg/dL — ABNORMAL HIGH (ref 70–99)

## 2023-06-17 MED ORDER — ASPIRIN 81 MG PO TBEC
81.0000 mg | DELAYED_RELEASE_TABLET | Freq: Two times a day (BID) | ORAL | Status: DC
  Administered 2023-06-18 – 2023-06-19 (×3): 81 mg via ORAL
  Filled 2023-06-17 (×3): qty 1

## 2023-06-17 NOTE — TOC Progression Note (Signed)
 Transition of Care [facility]) - Progression Note    Patient Details  Name: Paula Andrews MRN: [id number] Date of Birth: [birth year]  Transition of Care [facility]) CM/SW Contact  Catalina Gravel, [place] Phone Number: 06/17/2023, 5:37 PM  Clinical Narrative:    CSW visited pt at bedside along with spouse and adult daughter, provided a list of SNF providers.  Plan is to revoke Hospice while pt is in rehab for recovery.  The first choice is [facility] and CSW shared that facility is owned by [facility].  Souse indicates he wants a facility in town. He is a bit anxious about the revoking Hospice process.  CSW assured him that he will be supported in the process. Agreed to initiate bed request to [facility] tonight and assured him that a colleague from [facility] would be in touch with him tomorrow.  TOC to follow.      Barriers to Discharge: Continued Medical Work up  Expected Discharge Plan and Services                                               Social Determinants of Health (SDOH) Interventions SDOH Screenings   Food Insecurity: Patient Unable To Answer (06/14/2023)  Housing: Patient Unable To Answer (06/14/2023)  Transportation Needs: Patient Unable To Answer (06/14/2023)  Utilities: Patient Unable To Answer (06/14/2023)  Social Connections: Patient Unable To Answer (06/14/2023)  Tobacco Use: Medium Risk (06/15/2023)    Readmission Risk Interventions    09/20/2021    3:04 PM  Readmission Risk Prevention Plan  Transportation Screening Complete  PCP or Specialist Appt within 5-7 Days Complete  Home Care Screening Complete  Medication Review (RN CM) Complete

## 2023-06-17 NOTE — Progress Notes (Signed)
 PROGRESS NOTE   ANTOINETT DORMAN  ZOX:[id number] DOB: 10/01/1945 DOA: 06/14/2023 PCP: Merlene Laughter, MD (Inactive)   Chief Complaint  Patient presents with   Fall   Level of care: Med-Surg  Brief Admission History:  78 y.o. female with medical history significant for GERD, anxiety, osteoarthritis, dementia, depression, type 2 diabetes mellitus, essential hypertension, hypothyroidism and seasonal allergies, who presented to the emergency room with acute onset of unwitnessed fall at her ALF likely mechanical fall.  She apparently hit her head but there was no presyncope or syncope documented.  The patient has advanced dementia and therefore is a 9 historian.  No fever or chills.  Her daughter stated that this is her second fall this week.  No nausea or vomiting or abdominal pain were reported.  No fever or chills.  No reported dysuria, oliguria or hematuria or flank pain.  No reported cough or wheezing or dyspnea.   ED Course: When the patient came to the ER, BP was 141/65 and BP 160/79 with otherwise unremarkable vital signs.  Labs revealed mild hypokalemia of 3.4 and blood glucose of 165, BUN of 24 and creatinine 1.1.  CBC showed leukocytosis of 11.6 and hemoglobin 10.6 hematocrit 34.2 lower than previous levels.     EKG as reviewed by me : EKG showed normal sinus rhythm with a rate of 73 with minimal voltage criteria for LVH. Imaging: Pelvic and hip x-ray showed displaced right femoral neck fracture.Portable chest x-ray showed low lung volumes and bronchovascular crowding versus vascular congestion.   The patient will be admitted to a telemetry bed for further evaluation and management.   Assessment and Plan:  Closed right hip fracture s/p ORIF 3/14 with Dr. Dallas Schimke - Pain management will be provided. - Orthopedic consult to be obtained. - Dr. Dallas Schimke postop orders in place -- POD#2 pt now starting to take oral medication, starting aspirin 81 mg BID for DVT prevention starting 3/17, stop sq  heparin after final evening dose today.    Dementia with behavioral disturbance  - continue the Namenda and Zyprexa.  Type 2 diabetes mellitus without complications  - We will place the patient on supplemental coverage with NovoLog. - We will continue glipizide and hold off metformin. CBG (last 3)  Recent Labs    06/15/23 1325 06/15/23 1430 06/17/23 1126  GLUCAP 119* 99 143*   Essential hypertension - continue antihypertensive therapy.  Anxiety and depression - continue BuSpar, Remeron and escitalopram as well as Zyprexa if able to take oral  Hypokalemia - Repleted   Hypothyroidism - Continue Synthroid.  DVT prophylaxis: SCDs Code Status: Full  Family Communication: bedside updated  Disposition:    Consultants:  orthopedics Procedures:  ORIF 3/14>  Antimicrobials:    Subjective: Pt is nonverbal due to advanced dementia.   Objective: Vitals:   06/17/23 0303 06/17/23 0448 06/17/23 0936 06/17/23 1252  BP: (!) 114/47 (!) 102/58 (!) 125/48 (!) 118/57  Pulse: (!) 109 90 83 77  Resp: (!) 24 (!) 22  (!) 21  Temp: 97.8 F (36.6 C) 98 F (36.7 C) 97.7 F (36.5 C) (!) 97.4 F (36.3 C)  TempSrc: Axillary Oral Axillary Oral  SpO2: 94% 96% 100% 98%  Weight:      Height:        Intake/Output Summary (Last 24 hours) at 06/17/2023 1451 Last data filed at 06/17/2023 1253 Gross per 24 hour  Intake 370 ml  Output 400 ml  Net -30 ml   Filed Weights   06/14/23 2114  Weight: 52.4 kg   Examination:  General exam: Appears calm and comfortable she is nonverbal with advanced dementia. Respiratory system: Clear to auscultation. Respiratory effort normal. Cardiovascular system: normal S1 & S2 heard. No JVD, murmurs, rubs, gallops or clicks. No pedal edema. Gastrointestinal system: Abdomen is nondistended, soft and nontender. No organomegaly or masses felt. Normal bowel sounds heard. Central nervous system: Alert and oriented. No focal neurological deficits. Extremities:  wounds clean, dry and intact. Skin: No rashes, lesions or ulcers. Psychiatry: Judgement and insight appear severely diminished. Mood & affect flat.   Data Reviewed: I have personally reviewed following labs and imaging studies  CBC: Recent Labs  Lab 06/14/23 1545 06/15/23 0418 06/16/23 0329  WBC 11.6* 9.3 8.4  HGB 10.6* 10.6* 8.9*  HCT 34.2* 34.4* 29.8*  MCV 85.7 87.5 90.0  PLT 200 203 167    Basic Metabolic Panel: Recent Labs  Lab 06/14/23 1545 06/15/23 0418 06/16/23 0329  NA 138 140 139  K 3.4* 4.0 4.0  CL 102 109 109  CO2 24 23 22   GLUCOSE 165* 127* 157*  BUN 24* 22 22  CREATININE 1.10* 0.79 0.91  CALCIUM 9.1 8.8* 8.4*  MG 1.8  --   --     CBG: Recent Labs  Lab 06/15/23 1325 06/15/23 1430 06/17/23 1126  GLUCAP 119* 99 143*    Recent Results (from the past 240 hours)  Culture, blood (Routine X 2) w Reflex to ID Panel     Status: None (Preliminary result)   Collection Time: 06/14/23 10:17 PM   Specimen: Left Antecubital; Blood  Result Value Ref Range Status   Specimen Description   Final    LEFT ANTECUBITAL BOTTLES DRAWN AEROBIC AND ANAEROBIC   Special Requests   Final    Blood Culture results may not be optimal due to an inadequate volume of blood received in culture bottles   Culture   Final    NO GROWTH 3 DAYS Performed at [facility], [address], [place], [place] [address]    Report Status PENDING  Incomplete  Culture, blood (Routine X 2) w Reflex to ID Panel     Status: None (Preliminary result)   Collection Time: 06/14/23 10:17 PM   Specimen: BLOOD LEFT WRIST  Result Value Ref Range Status   Specimen Description   Final    BLOOD LEFT WRIST BOTTLES DRAWN AEROBIC AND ANAEROBIC   Special Requests Blood Culture adequate volume  Final   Culture   Final    NO GROWTH 3 DAYS Performed at [facility], [address], [place], [place] [address]    Report Status PENDING  Incomplete  Resp panel by RT-PCR (RSV, Flu A&B, Covid) Anterior Nasal Swab      Status: None   Collection Time: 06/14/23 10:20 PM   Specimen: Anterior Nasal Swab  Result Value Ref Range Status   SARS Coronavirus 2 by RT PCR NEGATIVE NEGATIVE Final    Comment: (NOTE) SARS-CoV-2 target nucleic acids are NOT DETECTED.  The SARS-CoV-2 RNA is generally detectable in upper respiratory specimens during the acute phase of infection. The lowest concentration of SARS-CoV-2 viral copies this assay can detect is 138 copies/mL. A negative result does not preclude SARS-Cov-2 infection and should not be used as the sole basis for treatment or other patient management decisions. A negative result may occur with  improper specimen collection/handling, submission of specimen other than nasopharyngeal swab, presence of viral mutation(s) within the areas targeted by this assay, and inadequate number of  viral copies(<138 copies/mL). A negative result must be combined with clinical observations, patient history, and epidemiological information. The expected result is Negative.  Fact Sheet for Patients:  BloggerCourse.com  Fact Sheet for Healthcare Providers:  SeriousBroker.it  This test is no t yet approved or cleared by the [place] FDA and  has been authorized for detection and/or diagnosis of SARS-CoV-2 by FDA under an Emergency Use Authorization (EUA). This EUA will remain  in effect (meaning this test can be used) for the duration of the COVID-19 declaration under Section 564(b)(1) of the Act, 21 U.S.C.section 360bbb-3(b)(1), unless the authorization is terminated  or revoked sooner.       Influenza A by PCR NEGATIVE NEGATIVE Final   Influenza B by PCR NEGATIVE NEGATIVE Final    Comment: (NOTE) The Xpert Xpress SARS-CoV-2/FLU/RSV plus assay is intended as an aid in the diagnosis of influenza from Nasopharyngeal swab specimens and should not be used as a sole basis for treatment. Nasal washings and aspirates are  unacceptable for Xpert Xpress SARS-CoV-2/FLU/RSV testing.  Fact Sheet for Patients: BloggerCourse.com  Fact Sheet for Healthcare Providers: SeriousBroker.it  This test is not yet approved or cleared by the [place] FDA and has been authorized for detection and/or diagnosis of SARS-CoV-2 by FDA under an Emergency Use Authorization (EUA). This EUA will remain in effect (meaning this test can be used) for the duration of the COVID-19 declaration under Section 564(b)(1) of the Act, 21 U.S.C. section 360bbb-3(b)(1), unless the authorization is terminated or revoked.     Resp Syncytial Virus by PCR NEGATIVE NEGATIVE Final    Comment: (NOTE) Fact Sheet for Patients: BloggerCourse.com  Fact Sheet for Healthcare Providers: SeriousBroker.it  This test is not yet approved or cleared by the [place] FDA and has been authorized for detection and/or diagnosis of SARS-CoV-2 by FDA under an Emergency Use Authorization (EUA). This EUA will remain in effect (meaning this test can be used) for the duration of the COVID-19 declaration under Section 564(b)(1) of the Act, 21 U.S.C. section 360bbb-3(b)(1), unless the authorization is terminated or revoked.  Performed at [facility], [address], [place], [place] [address]   Surgical pcr screen     Status: None   Collection Time: 06/14/23 11:46 PM   Specimen: Nasal Mucosa; Nasal Swab  Result Value Ref Range Status   MRSA, PCR NEGATIVE NEGATIVE Final   Staphylococcus aureus NEGATIVE NEGATIVE Final    Comment: (NOTE) The Xpert SA Assay (FDA approved for NASAL specimens in patients 46 years of age and older), is one component of a comprehensive surveillance program. It is not intended to diagnose infection nor to guide or monitor treatment. Performed at [facility], [address], [place], [place] [address]      Radiology  Studies: No results found.   Scheduled Meds:  [START ON 06/18/2023] aspirin EC  81 mg Oral BID   famotidine  20 mg Oral q morning   feeding supplement  237 mL Oral BID BM   heparin  5,000 Units Subcutaneous Q8H   levothyroxine  50 mcg Oral Q0600   lisinopril  5 mg Oral q morning   melatonin  3 mg Oral QHS   OLANZapine  10 mg Oral QHS   Continuous Infusions:  cefTRIAXone (ROCEPHIN)  IV 1 g (06/16/23 2130)     LOS: 3 days   Time spent: 50 mins  Lillianna Sabel Laural Benes, MD How to contact the [facility] Attending or Consulting provider 7A - 7P or covering provider during  after hours 7P -7A, for this patient?  Check the care team in [facility] and look for a) attending/consulting TRH provider listed and b) the [facility] team listed Log into [email or web address] to find provider on call.  Locate the [facility] provider you are looking for under [facility] and page to a number that you can be directly reached. If you still have difficulty reaching the provider, please page the [facility] (Director on Call) for the Hospitalists listed on amion for assistance.  06/17/2023, 2:51 PM

## 2023-06-17 NOTE — Progress Notes (Signed)
 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 Patient had a fall a few days prior to hospitalization but continued to ambulate. She began having more pain and was transferred to hospital and admitted on 3.13 with right femoral neck fracture. Per Dr. Patric Dykes with [facility] this is a related hospital admission.   Paula Andrews continues to recover from hip surgery and for the most part has remained comfortable. Therapy is recommending rehab and the family would like to explore this option, this was relayed to [facility].    Patient is inpatient appropriate with need for post op recovery/monitoring of surgical fixation to right hip and IVAB.   Vital Signs- 97.8/61/21      118/57    spO2 98% 2L nasal cannula      Intake/Output- 370/250 Abnormal labs- Ca+ 8.4, RBC 3.31, Hgb 8.9, Hct 29.8 Diagnostics- None new      IV/PRN Meds- Rocephin 1g q24H IV Problem List as per progress note Dr. Standley Dakins 3.15.25 Closed right hip fracture s/p ORIF 3/14 with Dr. Dallas Schimke - Pain management will be provided. - Orthopedic consult to be obtained. - Dr. Dallas Schimke postop orders in place -- POD#2 pt now starting to take oral medication, starting aspirin 81 mg BID for DVT prevention starting 3/17, stop sq heparin after final evening dose today.     Dementia with behavioral disturbance  - continue the Namenda and Zyprexa.   Type 2 diabetes mellitus without complications  - We will place the patient on supplemental coverage with NovoLog. - We will continue glipizide and hold off metformin. CBG (last 3)   Discharge Planning- Discharge back to facility vs skilled rehab, family leaning towards rehab Family Contact- talked with husband by phone IDT- Updated Goals of care - DNR but family desires surgical fixation of  fracture Thea Gist, RN, [facility] Liaison [phone]

## 2023-06-17 NOTE — TOC Progression Note (Signed)
 Transition of Care [facility]) - Progression Note    Patient Details  Name: Paula Andrews MRN: [id number] Date of Birth: [birth year]  [facility]) CM/SW Contact  Catalina Gravel, LCSW Phone Number: 06/17/2023, 8:20 AM  Clinical Narrative:    CSW received a message from [place] with [facility].  She has started the conversation with the family on Friday about possible rehab and they indicated they thought it would probably be necessary. [facility] will follow up with the daughter today.    Barriers to Discharge: Continued Medical Work up  Expected Discharge Plan and Services                                               Social Determinants of Health (SDOH) Interventions SDOH Screenings   Food Insecurity: Patient Unable To Answer (06/14/2023)  Housing: Patient Unable To Answer (06/14/2023)  Transportation Needs: Patient Unable To Answer (06/14/2023)  Utilities: Patient Unable To Answer (06/14/2023)  Social Connections: Patient Unable To Answer (06/14/2023)  Tobacco Use: Medium Risk (06/15/2023)    Readmission Risk Interventions    09/20/2021    3:04 PM  Readmission Risk Prevention Plan  Transportation Screening Complete  PCP or Specialist Appt within 5-7 Days Complete  Home Care Screening Complete  Medication Review (RN CM) Complete

## 2023-06-17 NOTE — Progress Notes (Signed)
   06/17/23 0303  Assess: MEWS Score  Temp 97.8 F (36.6 C)  BP (!) 114/47  MAP (mmHg) 67  Pulse Rate (!) 109  Resp (!) 24  SpO2 94 %  O2 Device Nasal Cannula  O2 Flow Rate (L/min) 2 L/min  Assess: MEWS Score  MEWS Temp 0  MEWS Systolic 0  MEWS Pulse 1  MEWS RR 1  MEWS LOC 0  MEWS Score 2  MEWS Score Color Yellow  Assess: if the MEWS score is Yellow or Red  Were vital signs accurate and taken at a resting state? Yes  Does the patient meet 2 or more of the SIRS criteria? Yes  Does the patient have a confirmed or suspected source of infection? No  MEWS guidelines implemented  Yes, yellow  Treat  MEWS Interventions Considered administering scheduled or prn medications/treatments as ordered  Take Vital Signs  Increase Vital Sign Frequency  Yellow: Q2hr x1, continue Q4hrs until patient remains green for 12hrs  Escalate  MEWS: Escalate Yellow: Discuss with charge nurse and consider notifying provider and/or RRT  Notify: Charge Nurse/RN  Name of Charge Nurse/RN Notified Glenda Chroman, RN  Provider Notification  Provider Name/Title Arrien, MD  Date Provider Notified 06/17/23  Method of Notification Page  Notification Reason Change in status  Assess: SIRS CRITERIA  SIRS Temperature  0  SIRS Respirations  1  SIRS Pulse 1  SIRS WBC 0  SIRS Score Sum  2

## 2023-06-18 DIAGNOSIS — S72001A Fracture of unspecified part of neck of right femur, initial encounter for closed fracture: Secondary | ICD-10-CM | POA: Diagnosis not present

## 2023-06-18 DIAGNOSIS — I10 Essential (primary) hypertension: Secondary | ICD-10-CM | POA: Diagnosis not present

## 2023-06-18 DIAGNOSIS — E876 Hypokalemia: Secondary | ICD-10-CM | POA: Diagnosis not present

## 2023-06-18 DIAGNOSIS — F03918 Unspecified dementia, unspecified severity, with other behavioral disturbance: Secondary | ICD-10-CM | POA: Diagnosis not present

## 2023-06-18 LAB — BASIC METABOLIC PANEL WITH GFR
Anion gap: 8 (ref 5–15)
BUN: 33 mg/dL — ABNORMAL HIGH (ref 8–23)
CO2: 22 mmol/L (ref 22–32)
Calcium: 8.5 mg/dL — ABNORMAL LOW (ref 8.9–10.3)
Chloride: 111 mmol/L (ref 98–111)
Creatinine, Ser: 0.81 mg/dL (ref 0.44–1.00)
GFR, Estimated: >60 mL/min
Glucose, Bld: 112 mg/dL — ABNORMAL HIGH (ref 70–99)
Potassium: 3.9 mmol/L (ref 3.5–5.1)
Sodium: 141 mmol/L (ref 135–145)

## 2023-06-18 LAB — CBC
HCT: 28.8 % — ABNORMAL LOW (ref 36.0–46.0)
Hemoglobin: 8.3 g/dL — ABNORMAL LOW (ref 12.0–15.0)
MCH: 26.4 pg (ref 26.0–34.0)
MCHC: 28.8 g/dL — ABNORMAL LOW (ref 30.0–36.0)
MCV: 91.7 fL (ref 80.0–100.0)
Platelets: 181 10*3/uL (ref 150–400)
RBC: 3.14 MIL/uL — ABNORMAL LOW (ref 3.87–5.11)
RDW: 15.9 % — ABNORMAL HIGH (ref 11.5–15.5)
WBC: 8.1 10*3/uL (ref 4.0–10.5)
nRBC: 0 % (ref 0.0–0.2)

## 2023-06-18 NOTE — Plan of Care (Signed)
   Problem: Education: Goal: Knowledge of General Education information will improve Description Including pain rating scale, medication(s)/side effects and non-pharmacologic comfort measures Outcome: Progressing

## 2023-06-18 NOTE — NC FL2 (Signed)
 [place] MEDICAID FL2 LEVEL OF CARE FORM     IDENTIFICATION  Patient Name: Paula Andrews Birthdate: Oct 29, 1945 Sex: female Admission Date (Current Location): 06/14/2023  [place] and [place] Number:  Aaron Edelman [id number] N Facility and Address:  [facility],  [address] [place] [address]      Provider Number: [id number]  Attending Physician Name and Address:  Cleora Fleet, MD  Relative Name and Phone Number:  Jlyn, Cerros (Spouse)  [phone] (Mobile)    Current Level of Care: Hospital Recommended Level of Care: Skilled Nursing Facility Prior Approval Number:    Date Approved/Denied:   PASRR Number: [id number]  Discharge Plan: SNF    Current Diagnoses: Patient Active Problem List   Diagnosis Date Noted   Closed right hip fracture, initial encounter (HCC) 06/14/2023   Hypokalemia 06/14/2023   Anxiety and depression 06/14/2023   Essential hypertension 06/14/2023   Type 2 diabetes mellitus without complications (HCC) 06/14/2023   Dementia with behavioral disturbance (HCC) 06/14/2023   Leucocytosis 09/18/2021   Hyperkalemia 09/18/2021   Acute hypoxemic respiratory failure (HCC) 09/18/2021   HCAP (healthcare-associated pneumonia)    Enterobacter sepsis (HCC) 03/24/2018   Sepsis due to undetermined organism (HCC) 03/23/2018   Lobar pneumonia (HCC) 03/23/2018   Bacteremia due to Gram-negative bacteria 03/23/2018   Gram negative sepsis (HCC) 03/23/2018   Pulmonary embolus (HCC)    Acute respiratory failure with hypoxia (HCC) 03/13/2018   Acute encephalopathy 03/13/2018   Fall 03/13/2018   Sinus bradycardia 03/13/2018   Dementia (HCC) 03/13/2018   Hypothyroidism 03/13/2018   Mood disorder (HCC) 03/13/2018   COPD with acute exacerbation (HCC) 08/07/2017   Adjustment 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 05/31/2017   Chronic respiratory failure with hypoxia and hypercapnia (HCC)    Acute respiratory failure with hypoxia  and hypercapnia (HCC) 05/30/2017   Acute lower UTI 05/30/2017   HTN (hypertension) 05/30/2017   Diabetes mellitus type II, controlled (HCC) 05/30/2017   HLD (hyperlipidemia) 05/30/2017   UTI (urinary tract infection) 05/30/2017   Hypoxia 05/29/2017   Hyponatremia 05/16/2017   Fracture of radial neck, left, closed 01/29/2012   Closed fracture of part of upper end of humerus 04/13/2010    Orientation RESPIRATION BLADDER Height & Weight     Self  O2 (2L) Incontinent Weight: 115 lb 8.3 oz (52.4 kg) Height:  5\' 4"  (162.6 cm)  BEHAVIORAL SYMPTOMS/MOOD NEUROLOGICAL BOWEL NUTRITION STATUS      Incontinent Diet (See d/c summary)  AMBULATORY STATUS COMMUNICATION OF NEEDS Skin   Extensive Assist Non-Verbally Skin abrasions, Surgical wounds                       Personal Care Assistance Level of Assistance  Bathing, Feeding, Dressing Bathing Assistance: Maximum assistance Feeding assistance: Maximum assistance Dressing Assistance: Maximum assistance     Functional Limitations Info  Sight, Hearing, Speech Sight Info: Adequate Hearing Info: Adequate Speech Info: Impaired    SPECIAL CARE FACTORS FREQUENCY  PT (By licensed PT)     PT Frequency: 5x weekly              Contractures      Additional Factors Info  Code Status, Allergies, Psychotropic Code Status Info: DNR- Limited Allergies Info: No known allergies Psychotropic Info: Buspar, Effexor         Current Medications (06/18/2023):  This is the current hospital active medication list Current Facility-Administered Medications  Medication Dose Route Frequency Provider Last Rate Last Admin  acetaminophen (TYLENOL) tablet 650 mg  650 mg Oral Q4H PRN Oliver Barre, MD   650 mg at 06/16/23 2112   alum & mag hydroxide-simeth (MAALOX/MYLANTA) 200-200-20 MG/5ML suspension 30 mL  30 mL Oral Q6H PRN Oliver Barre, MD       aspirin EC tablet 81 mg  81 mg Oral BID Johnson, Clanford L, MD   81 mg at 06/18/23 0920    cefTRIAXone (ROCEPHIN) 1 g in sodium chloride 0.9 % 100 mL IVPB  1 g Intravenous Q24H Thane Edu A, MD 200 mL/hr at 06/17/23 2123 1 g at 06/17/23 2123   famotidine (PEPCID) tablet 20 mg  20 mg Oral q morning Oliver Barre, MD   20 mg at 06/18/23 0920   feeding supplement (ENSURE ENLIVE / ENSURE PLUS) liquid 237 mL  237 mL Oral BID BM Johnson, Clanford L, MD   237 mL at 06/18/23 0920   haloperidol lactate (HALDOL) injection 1 mg  1 mg Intramuscular Q6H PRN Oliver Barre, MD       hydrALAZINE (APRESOLINE) injection 10 mg  10 mg Intravenous Q6H PRN Oliver Barre, MD   10 mg at 06/15/23 0031   HYDROcodone-acetaminophen (NORCO/VICODIN) 5-325 MG per tablet 1-2 tablet  1-2 tablet Oral Q6H PRN Oliver Barre, MD   2 tablet at 06/15/23 2303   levothyroxine (SYNTHROID) tablet 50 mcg  50 mcg Oral Q0600 Oliver Barre, MD   50 mcg at 06/18/23 0615   lisinopril (ZESTRIL) tablet 5 mg  5 mg Oral q morning Oliver Barre, MD   5 mg at 06/17/23 1610   loperamide (IMODIUM) capsule 2 mg  2 mg Oral PRN Oliver Barre, MD       magnesium hydroxide (MILK OF MAGNESIA) suspension 30 mL  30 mL Oral Daily PRN Oliver Barre, MD       melatonin tablet 3 mg  3 mg Oral QHS Thane Edu A, MD   3 mg at 06/17/23 2059   morphine (PF) 2 MG/ML injection 0.5 mg  0.5 mg Intravenous Q2H PRN Oliver Barre, MD       OLANZapine (ZYPREXA) tablet 10 mg  10 mg Oral QHS Thane Edu A, MD   10 mg at 06/17/23 2059   ondansetron (ZOFRAN) injection 4 mg  4 mg Intravenous Q4H PRN Oliver Barre, MD   4 mg at 06/15/23 1402   Oral care mouth rinse  15 mL Mouth Rinse PRN Laural Benes, Clanford L, MD       traZODone (DESYREL) tablet 25 mg  25 mg Oral QHS PRN Oliver Barre, MD   25 mg at 06/16/23 2111     Discharge Medications: Please see discharge summary for a list of discharge medications.  Relevant Imaging Results:  Relevant Lab Results:   Additional Information SSN: [id number]  Karn Cassis, LCSW

## 2023-06-18 NOTE — Progress Notes (Signed)
 PROGRESS NOTE   Paula Andrews  ONG:[id number] DOB: 1945-05-23 DOA: 06/14/2023 PCP: Merlene Laughter, MD (Inactive)   Chief Complaint  Patient presents with   Fall   Level of care: Med-Surg  Brief Admission History:  78 y.o. female with medical history significant for GERD, anxiety, osteoarthritis, dementia, depression, type 2 diabetes mellitus, essential hypertension, hypothyroidism and seasonal allergies, who presented to the emergency room with acute onset of unwitnessed fall at her ALF likely mechanical fall.  She apparently hit her head but there was no presyncope or syncope documented.  The patient has advanced dementia and therefore is a 9 historian.  No fever or chills.  Her daughter stated that this is her second fall this week.  No nausea or vomiting or abdominal pain were reported.  No fever or chills.  No reported dysuria, oliguria or hematuria or flank pain.  No reported cough or wheezing or dyspnea.   ED Course: When the patient came to the ER, BP was 141/65 and BP 160/79 with otherwise unremarkable vital signs.  Labs revealed mild hypokalemia of 3.4 and blood glucose of 165, BUN of 24 and creatinine 1.1.  CBC showed leukocytosis of 11.6 and hemoglobin 10.6 hematocrit 34.2 lower than previous levels.     EKG as reviewed by me : EKG showed normal sinus rhythm with a rate of 73 with minimal voltage criteria for LVH. Imaging: Pelvic and hip x-ray showed displaced right femoral neck fracture.Portable chest x-ray showed low lung volumes and bronchovascular crowding versus vascular congestion.   The patient will be admitted to a telemetry bed for further evaluation and management.   Assessment and Plan:  Closed right hip fracture s/p ORIF 3/14 with Dr. Dallas Schimke - Pain management will be provided. - Orthopedic consult to be obtained. - Dr. Dallas Schimke postop orders in place -- POD#3 pt now starting to take oral medication, continue aspirin 81 mg BID for DVT prevention starting 3/17, stop sq  heparin after final evening dose today.    Dementia with behavioral disturbance  - continue the Namenda and Zyprexa.  Type 2 diabetes mellitus without complications  - We will place the patient on supplemental coverage with NovoLog. - We will continue glipizide and hold off metformin. CBG (last 3)  Recent Labs    06/15/23 1430 06/17/23 1126  GLUCAP 99 143*   Essential hypertension - continue antihypertensive therapy.  Anxiety and depression - continue BuSpar, Remeron and escitalopram as well as Zyprexa if able to take oral  Hypokalemia - Repleted   Hypothyroidism - Continue Synthroid.  DVT prophylaxis: aspirin BID Code Status: Full  Family Communication: bedside updated  Disposition: working on SNF bed placement    Consultants:  orthopedics Procedures:  ORIF 3/14> Dr. Dallas Schimke  Antimicrobials:    Subjective: Pt is nonverbal due to advanced dementia.   Objective: Vitals:   06/17/23 2120 06/18/23 0554 06/18/23 0929 06/18/23 1252  BP: (!) 127/57 (!) 108/50 (!) 103/47 138/68  Pulse: 82 74  100  Resp: 18 (!) 22    Temp: 97.6 F (36.4 C)   (!) 97.5 F (36.4 C)  TempSrc: Oral   Axillary  SpO2:  98%  99%  Weight:      Height:        Intake/Output Summary (Last 24 hours) at 06/18/2023 1358 Last data filed at 06/18/2023 0801 Gross per 24 hour  Intake 570 ml  Output 300 ml  Net 270 ml   Filed Weights   06/14/23 2114  Weight: 52.4 kg  Examination:  General exam: Appears calm and comfortable she is nonverbal with advanced dementia. Respiratory system: Clear to auscultation. Respiratory effort normal. Cardiovascular system: normal S1 & S2 heard. No JVD, murmurs, rubs, gallops or clicks. No pedal edema. Gastrointestinal system: Abdomen is nondistended, soft and nontender. No organomegaly or masses felt. Normal bowel sounds heard. Central nervous system: Alert and oriented. No focal neurological deficits. Extremities: wounds clean, dry and intact. Skin: No  rashes, lesions or ulcers. Psychiatry: Judgement and insight appear severely diminished. Mood & affect flat.   Data Reviewed: I have personally reviewed following labs and imaging studies  CBC: Recent Labs  Lab 06/14/23 1545 06/15/23 0418 06/16/23 0329 06/18/23 0418  WBC 11.6* 9.3 8.4 8.1  HGB 10.6* 10.6* 8.9* 8.3*  HCT 34.2* 34.4* 29.8* 28.8*  MCV 85.7 87.5 90.0 91.7  PLT 200 203 167 181    Basic Metabolic Panel: Recent Labs  Lab 06/14/23 1545 06/15/23 0418 06/16/23 0329 06/18/23 0418  NA 138 140 139 141  K 3.4* 4.0 4.0 3.9  CL 102 109 109 111  CO2 24 23 22 22   GLUCOSE 165* 127* 157* 112*  BUN 24* 22 22 33*  CREATININE 1.10* 0.79 0.91 0.81  CALCIUM 9.1 8.8* 8.4* 8.5*  MG 1.8  --   --   --     CBG: Recent Labs  Lab 06/15/23 1325 06/15/23 1430 06/17/23 1126  GLUCAP 119* 99 143*    Recent Results (from the past 240 hours)  Culture, blood (Routine X 2) w Reflex to ID Panel     Status: None (Preliminary result)   Collection Time: 06/14/23 10:17 PM   Specimen: Left Antecubital; Blood  Result Value Ref Range Status   Specimen Description   Final    LEFT ANTECUBITAL BOTTLES DRAWN AEROBIC AND ANAEROBIC   Special Requests   Final    Blood Culture results may not be optimal due to an inadequate volume of blood received in culture bottles   Culture   Final    NO GROWTH 4 DAYS Performed at [facility], [address], [place], [place] [address]    Report Status PENDING  Incomplete  Culture, blood (Routine X 2) w Reflex to ID Panel     Status: None (Preliminary result)   Collection Time: 06/14/23 10:17 PM   Specimen: BLOOD LEFT WRIST  Result Value Ref Range Status   Specimen Description   Final    BLOOD LEFT WRIST BOTTLES DRAWN AEROBIC AND ANAEROBIC   Special Requests Blood Culture adequate volume  Final   Culture   Final    NO GROWTH 4 DAYS Performed at [facility], [address] [place], [place] [address]    Report Status PENDING  Incomplete  Resp panel  by RT-PCR (RSV, Flu A&B, Covid) Anterior Nasal Swab     Status: None   Collection Time: 06/14/23 10:20 PM   Specimen: Anterior Nasal Swab  Result Value Ref Range Status   SARS Coronavirus 2 by RT PCR NEGATIVE NEGATIVE Final    Comment: (NOTE) SARS-CoV-2 target nucleic acids are NOT DETECTED.  The SARS-CoV-2 RNA is generally detectable in upper respiratory specimens during the acute phase of infection. The lowest concentration of SARS-CoV-2 viral copies this assay can detect is 138 copies/mL. A negative result does not preclude SARS-Cov-2 infection and should not be used as the sole basis for treatment or other patient management decisions. A negative result may occur with  improper specimen collection/handling, submission of specimen other than nasopharyngeal swab,  presence of viral mutation(s) within the areas targeted by this assay, and inadequate number of viral copies(<138 copies/mL). A negative result must be combined with clinical observations, patient history, and epidemiological information. The expected result is Negative.  Fact Sheet for Patients:  BloggerCourse.com  Fact Sheet for Healthcare Providers:  SeriousBroker.it  This test is no t yet approved or cleared by the [place] FDA and  has been authorized for detection and/or diagnosis of SARS-CoV-2 by FDA under an Emergency Use Authorization (EUA). This EUA will remain  in effect (meaning this test can be used) for the duration of the COVID-19 declaration under Section 564(b)(1) of the Act, 21 U.S.C.section 360bbb-3(b)(1), unless the authorization is terminated  or revoked sooner.       Influenza A by PCR NEGATIVE NEGATIVE Final   Influenza B by PCR NEGATIVE NEGATIVE Final    Comment: (NOTE) The Xpert Xpress SARS-CoV-2/FLU/RSV plus assay is intended as an aid in the diagnosis of influenza from Nasopharyngeal swab specimens and should not be used as a sole  basis for treatment. Nasal washings and aspirates are unacceptable for Xpert Xpress SARS-CoV-2/FLU/RSV testing.  Fact Sheet for Patients: BloggerCourse.com  Fact Sheet for Healthcare Providers: SeriousBroker.it  This test is not yet approved or cleared by the [place] FDA and has been authorized for detection and/or diagnosis of SARS-CoV-2 by FDA under an Emergency Use Authorization (EUA). This EUA will remain in effect (meaning this test can be used) for the duration of the COVID-19 declaration under Section 564(b)(1) of the Act, 21 U.S.C. section 360bbb-3(b)(1), unless the authorization is terminated or revoked.     Resp Syncytial Virus by PCR NEGATIVE NEGATIVE Final    Comment: (NOTE) Fact Sheet for Patients: BloggerCourse.com  Fact Sheet for Healthcare Providers: SeriousBroker.it  This test is not yet approved or cleared by the [place] FDA and has been authorized for detection and/or diagnosis of SARS-CoV-2 by FDA under an Emergency Use Authorization (EUA). This EUA will remain in effect (meaning this test can be used) for the duration of the COVID-19 declaration under Section 564(b)(1) of the Act, 21 U.S.C. section 360bbb-3(b)(1), unless the authorization is terminated or revoked.  Performed at [facility], [address], [place], [place] [address]   Surgical pcr screen     Status: None   Collection Time: 06/14/23 11:46 PM   Specimen: Nasal Mucosa; Nasal Swab  Result Value Ref Range Status   MRSA, PCR NEGATIVE NEGATIVE Final   Staphylococcus aureus NEGATIVE NEGATIVE Final    Comment: (NOTE) The Xpert SA Assay (FDA approved for NASAL specimens in patients 13 years of age and older), is one component of a comprehensive surveillance program. It is not intended to diagnose infection nor to guide or monitor treatment. Performed at [facility], [address] [place], [place] [address]    Radiology Studies: No results found.  Scheduled Meds:  aspirin EC  81 mg Oral BID   famotidine  20 mg Oral q morning   feeding supplement  237 mL Oral BID BM   levothyroxine  50 mcg Oral Q0600   lisinopril  5 mg Oral q morning   melatonin  3 mg Oral QHS   OLANZapine  10 mg Oral QHS   Continuous Infusions:  cefTRIAXone (ROCEPHIN)  IV 1 g (06/17/23 2123)     LOS: 4 days   Time spent: 48 mins  Teagon Kron Laural Benes, MD How to contact the [facility] Attending or Consulting provider 7A - 7P or covering provider  during after hours 7P -7A, for this patient?  Check the care team in [facility] and look for a) attending/consulting TRH provider listed and b) the [facility] team listed Log into [email or web address] to find provider on call.  Locate the [facility] provider you are looking for under [facility] and page to a number that you can be directly reached. If you still have difficulty reaching the provider, please page the [facility] (Director on Call) for the Hospitalists listed on amion for assistance.  06/18/2023, 1:58 PM

## 2023-06-18 NOTE — Progress Notes (Addendum)
 [name] [phone] [employer], contracting Hospice hospital liaison note:   Patient revoked hospice services in order to seek skilled rehab.   Outpatient palliative referral will be initiated.   Please don't hesitate to reach out for any hospice related questions or concerns.  Thea Gist, BSN ,[employer] [phone]

## 2023-06-18 NOTE — Plan of Care (Signed)
  Problem: Acute Rehab OT Goals (only OT should resolve) Goal: Pt. Will Perform Eating Flowsheets (Taken 06/18/2023 1119) Pt Will Perform Eating: with set-up Goal: Pt. Will Perform Grooming Flowsheets (Taken 06/18/2023 1119) Pt Will Perform Grooming:  with min assist  sitting Goal: Pt. Will Perform Upper Body Dressing Flowsheets (Taken 06/18/2023 1119) Pt Will Perform Upper Body Dressing:  with min assist  sitting Goal: Pt. Will Perform Lower Body Dressing Flowsheets (Taken 06/18/2023 1119) Pt Will Perform Lower Body Dressing:  with mod assist  sitting/lateral leans Goal: Pt. Will Transfer To Toilet Flowsheets (Taken 06/18/2023 1119) Pt Will Transfer to Toilet:  with min assist  with mod assist  stand pivot transfer Goal: Pt. Will Perform Toileting-Clothing Manipulation Flowsheets (Taken 06/18/2023 1119) Pt Will Perform Toileting - Clothing Manipulation and hygiene:  with mod assist  sitting/lateral leans Goal: Pt/Caregiver Will Perform Home Exercise Program Flowsheets (Taken 06/18/2023 1119) Pt/caregiver will Perform Home Exercise Program:  Increased ROM  Increased strength  Both right and left upper extremity  With minimal assist  Jerimah Witucki OT, MOT

## 2023-06-18 NOTE — TOC Progression Note (Addendum)
 Transition of Care [facility]) - Progression Note    Patient Details  Name: Paula Andrews MRN: [id number] Date of Birth: March 28, 1946  [facility] CM/SW Contact  Karn Cassis, [place] Phone Number: 06/18/2023, 1:13 PM  Clinical Narrative:  Reviewed bed offers. Pt's husband requests search be expanded to [place] and [place] in [place].  Expected Discharge Plan: Skilled Nursing Facility Barriers to Discharge: Continued Medical Work up  Expected Discharge Plan and Services In-house Referral: Clinical Social Work   Post Acute Care Choice: Skilled Nursing Facility Living arrangements for the past 2 months: Assisted Living Facility                                       Social Determinants of Health (SDOH) Interventions SDOH Screenings   Food Insecurity: Patient Unable To Answer (06/14/2023)  Housing: Patient Unable To Answer (06/14/2023)  Transportation Needs: Patient Unable To Answer (06/14/2023)  Utilities: Patient Unable To Answer (06/14/2023)  Social Connections: Patient Unable To Answer (06/14/2023)  Tobacco Use: Medium Risk (06/15/2023)    Readmission Risk Interventions    06/18/2023   10:05 AM 09/20/2021    3:04 PM  Readmission Risk Prevention Plan  Transportation Screening Complete Complete  PCP or Specialist Appt within 5-7 Days  Complete  Home Care Screening  Complete  Medication Review (RN CM)  Complete  HRI or Home Care Consult Complete   Social Work Consult for Recovery Care Planning/Counseling Complete   Palliative Care Screening --   Medication Review Oceanographer) Complete

## 2023-06-18 NOTE — TOC Initial Note (Signed)
 Transition of Care [facility]) - Initial/Assessment Note    Patient Details  Name: Paula Andrews MRN: [id number] Date of Birth: 1946/03/31  Transition [facility]) CM/SW Contact:    Karn Cassis, LCSW Phone Number: 06/18/2023, 10:07 AM  Clinical Narrative:  Pt admitted with right hip fracture. She was a resident at [facility] with [facility] following. Family has now decided on SNF and will revoke hospice today. SNF bed search initiated. Will follow up with family with bed offers when available.                  Expected Discharge Plan: Skilled Nursing Facility Barriers to Discharge: Continued Medical Work up   Patient Goals and CMS Choice Patient states their goals for this hospitalization and ongoing recovery are:: rehab   Choice offered to / list presented to : Spouse [place] ownership interest in [facility].provided to:: Spouse    Expected Discharge Plan and Services In-house Referral: Clinical Social Work   Post Acute Care Choice: Skilled Nursing Facility Living arrangements for the past 2 months: Assisted Living Facility                                      Prior Living Arrangements/Services Living arrangements for the past 2 months: Assisted Living Facility Lives with:: Facility Resident Patient language and need for interpreter reviewed:: Yes        Need for Family Participation in Patient Care: Yes (Comment) Care giver support system in place?: Yes (comment)   Criminal Activity/Legal Involvement Pertinent to Current Situation/Hospitalization: No - Comment as needed  Activities of Daily Living   ADL Screening (condition at time of admission) Independently performs ADLs?: No Does the patient have a NEW difficulty with bathing/dressing/toileting/self-feeding that is expected to last >3 days?: No Does the patient have a NEW difficulty with getting in/out of bed, walking, or climbing stairs that is expected to last >3 days?:  No Does the patient have a NEW difficulty with communication that is expected to last >3 days?: No Is the patient deaf or have difficulty hearing?: No Does the patient have difficulty seeing, even when wearing glasses/contacts?: No Does the patient have difficulty concentrating, remembering, or making decisions?: Yes  Permission Sought/Granted                  Emotional Assessment   Attitude/Demeanor/Rapport: Unable to Assess Affect (typically observed): Unable to Assess   Alcohol / Substance Use: Not Applicable Psych Involvement: No (comment)  Admission diagnosis:  Closed fracture of right hip, initial encounter (HCC) [S72.001A] Closed right hip fracture, initial encounter [facility]) [S72.001A] Patient Active Problem List   Diagnosis Date Noted   Closed right hip fracture, initial encounter (HCC) 06/14/2023   Hypokalemia 06/14/2023   Anxiety and depression 06/14/2023   Essential hypertension 06/14/2023   Type 2 diabetes mellitus without complications (HCC) 06/14/2023   Dementia with behavioral disturbance (HCC) 06/14/2023   Leucocytosis 09/18/2021   Hyperkalemia 09/18/2021   Acute hypoxemic respiratory failure (HCC) 09/18/2021   HCAP (healthcare-associated pneumonia)    Enterobacter sepsis (HCC) 03/24/2018   Sepsis due to undetermined organism (HCC) 03/23/2018   Lobar pneumonia (HCC) 03/23/2018   Bacteremia due to Gram-negative bacteria 03/23/2018   Gram negative sepsis (HCC) 03/23/2018   Pulmonary embolus (HCC)    Acute respiratory failure with hypoxia (HCC) 03/13/2018   Acute encephalopathy 03/13/2018   Fall 03/13/2018  Sinus bradycardia 03/13/2018   Dementia (HCC) 03/13/2018   Hypothyroidism 03/13/2018   Mood disorder (HCC) 03/13/2018   COPD with acute exacerbation (HCC) 08/07/2017   Adjustment 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 05/31/2017   Chronic respiratory failure with hypoxia and hypercapnia (HCC)    Acute respiratory failure with hypoxia  and hypercapnia (HCC) 05/30/2017   Acute lower UTI 05/30/2017   HTN (hypertension) 05/30/2017   Diabetes mellitus type II, controlled (HCC) 05/30/2017   HLD (hyperlipidemia) 05/30/2017   UTI (urinary tract infection) 05/30/2017   Hypoxia 05/29/2017   Hyponatremia 05/16/2017   Fracture of radial neck, left, closed 01/29/2012   Closed fracture of part of upper end of humerus 04/13/2010   PCP:  Merlene Laughter, MD (Inactive) Pharmacy:  No Pharmacies Listed    Social Drivers of Health (SDOH) Social History: SDOH Screenings   Food Insecurity: Patient Unable To Answer (06/14/2023)  Housing: Patient Unable To Answer (06/14/2023)  Transportation Needs: Patient Unable To Answer (06/14/2023)  Utilities: Patient Unable To Answer (06/14/2023)  Social Connections: Patient Unable To Answer (06/14/2023)  Tobacco Use: Medium Risk (06/15/2023)   SDOH Interventions:     Readmission Risk Interventions    06/18/2023   10:05 AM 09/20/2021    3:04 PM  Readmission Risk Prevention Plan  Transportation Screening Complete Complete  PCP or Specialist Appt within 5-7 Days  Complete  Home Care Screening  Complete  Medication Review (RN CM)  Complete  HRI or Home Care Consult Complete   Social Work Consult for Recovery Care Planning/Counseling Complete   Palliative Care Screening --   Medication Review Oceanographer) Complete

## 2023-06-18 NOTE — Evaluation (Addendum)
 Occupational Therapy Evaluation Patient Details Name: Paula Andrews MRN: [id number] DOB: 02/13/1946 Today's Date: 06/18/2023   History of Present Illness   Paula Andrews is a 78 y.o. female with medical history significant for GERD, anxiety, osteoarthritis, dementia, depression, type 2 diabetes mellitus, essential hypertension, hypothyroidism and seasonal allergies, who presented to the emergency room with acute onset of unwitnessed fall at her ALF likely mechanical fall.  She apparently hit her head but there was no presyncope or syncope documented.  The patient has advanced dementia and therefore is a 9 historian.  No fever or chills.  Her daughter stated that this is her second fall this week.  No nausea or vomiting or abdominal pain were reported.  No fever or chills.  No reported dysuria, oliguria or hematuria or flank pain.  No reported cough or wheezing or dyspnea. Status post hemiarthroplasty R hip. (per MD)     Clinical Impressions Pt has history of impaired cognition. Pt lethargic most of session. Max to total assist for bed mobility and transfer to the chair. Able to sit at EOB with mod A to maintain balance. Difficult to assess B UE strength. Limited A/ROM but pt unable to follow one step commands consistently. Max to total assist for most ADL's at this time. Pt left in the chair with the chair alarm set and call bell within reach. Pt removed from supplemental O2 but noted to desaturate with mixed reading. Back on 2 L the pt was able to stay around 92% SpO2. Heart rate was jumping around but was much more stable when monitory was placed on pt's toe rather than her finger which was tremoring. Hear rate more around 80 BPM with the final measurement.  Pt will benefit from continued OT in the hospital and recommended venue below to increase strength, balance, and endurance for safe ADL's.        If plan is discharge home, recommend the following:   A lot of help with walking and/or  transfers;A lot of help with bathing/dressing/bathroom;Assistance with cooking/housework;Assistance with feeding;Direct supervision/assist for medications management;Direct supervision/assist for financial management;Assist for transportation;Help with stairs or ramp for entrance;Supervision due to cognitive status     Functional Status Assessment   Patient has had a recent decline in their functional status and demonstrates the ability to make significant improvements in function in a reasonable and predictable amount of time.     Equipment Recommendations   None recommended by OT             Precautions/Restrictions   Precautions Precautions: Fall Recall of Precautions/Restrictions: Impaired Precaution/Restrictions Comments: Posterior hip precaution Required Braces or Orthoses:  (abduction pillow) Restrictions Weight Bearing Restrictions Per Provider Order: Yes RLE Weight Bearing Per Provider Order: Weight bearing as tolerated     Mobility Bed Mobility Overal bed mobility: Needs Assistance Bed Mobility: Supine to Sit     Supine to sit: Max assist, Total assist     General bed mobility comments: Much assist to sit at EOB    Transfers Overall transfer level: Needs assistance Equipment used: Rolling walker (2 wheels) Transfers: Sit to/from Stand, Bed to chair/wheelchair/BSC Sit to Stand: Max assist, Total assist Stand pivot transfers: Max assist, Total assist         General transfer comment: Attempted with RW but unable to complete; completed without RW with much assist form physical therapist      Balance Overall balance assessment: Needs assistance Sitting-balance support: Bilateral upper extremity supported, Feet supported Sitting balance-Leahy Scale:  Poor Sitting balance - Comments: seated at EOB; L lateral lean Postural control: Posterior lean, Left lateral lean Standing balance support: Bilateral upper extremity supported, During functional  activity, Reliant on assistive device for balance Standing balance-Leahy Scale: Poor Standing balance comment: without RW and supported by physical therapist                           ADL either performed or assessed with clinical judgement   ADL Overall ADL's : Needs assistance/impaired Eating/Feeding: Maximal assistance;Sitting   Grooming: Maximal assistance;Sitting   Upper Body Bathing: Maximal assistance;Moderate assistance;Sitting   Lower Body Bathing: Maximal assistance;Sitting/lateral leans   Upper Body Dressing : Maximal assistance;Sitting;Moderate assistance   Lower Body Dressing: Maximal assistance;Sitting/lateral leans   Toilet Transfer: Maximal assistance;Total assistance;Stand-pivot Toilet Transfer Details (indicate cue type and reason): simulated via EOB to chair without RW and much assist form PT Toileting- Clothing Manipulation and Hygiene: Total assistance;Maximal assistance;Bed level               Vision Baseline Vision/History: 1 Wears glasses (chart indicates pt wears glasses at baseline) Ability to See in Adequate Light: 1 Impaired Patient Visual Report: Other (comment) (Pt has cognitive impairments.) Vision Assessment?:  (difficult to assess)     Perception Perception: Not tested       Praxis Praxis: Not tested       Pertinent Vitals/Pain Pain Assessment Pain Assessment: Faces Faces Pain Scale: Hurts little more Pain Location: R hip Pain Descriptors / Indicators: Discomfort Pain Intervention(s): Limited activity within patient's tolerance, Monitored during session, Repositioned     Extremity/Trunk Assessment Upper Extremity Assessment Upper Extremity Assessment: Difficult to assess due to impaired cognition   Lower Extremity Assessment Lower Extremity Assessment: Defer to PT evaluation   Cervical / Trunk Assessment Cervical / Trunk Assessment: Kyphotic   Communication Communication Communication: Other (comment) Factors  Affecting Communication: Other (comment) (Minimal verbalization today. Pt did verbalize some but not consistently. Fairly lethargic.)   Cognition Arousal: Lethargic Behavior During Therapy: Flat affect Cognition: History of cognitive impairments             OT - Cognition Comments: Minimally verbal.                 Following commands: Impaired Following commands impaired: Follows one step commands inconsistently     Cueing  General Comments   Cueing Techniques: Verbal cues;Tactile cues                 Home Living Family/patient expects to be discharged to:: Assisted living                             Home Equipment: Other (comment)   Additional Comments: Pt poor historian.      Prior Functioning/Environment Prior Level of Function : Needs assist       Physical Assist : ADLs (physical)   ADLs (physical): IADLs;Bathing;Dressing Mobility Comments: Old chart info indicates ambulation without AD at ALF ADLs Comments: Previous history indicates assist for bathing and dressing from ALF staff. Pt is a poor historian.    OT Problem List: Decreased strength;Decreased range of motion;Decreased activity tolerance;Impaired balance (sitting and/or standing);Decreased cognition;Decreased safety awareness;Decreased knowledge of use of DME or AE;Decreased knowledge of precautions   OT Treatment/Interventions: Self-care/ADL training;Therapeutic exercise;DME and/or AE instruction;Energy conservation;Therapeutic activities;Cognitive remediation/compensation;Patient/family education;Balance training      OT Goals(Current goals can be found in the care  plan section)   Acute Rehab OT Goals Patient Stated Goal: none stated OT Goal Formulation: Patient unable to participate in goal setting Time For Goal Achievement: 07/02/23 Potential to Achieve Goals: Fair   OT Frequency:  Min 2X/week    Co-evaluation PT/OT/SLP Co-Evaluation/Treatment: Yes Reason for  Co-Treatment: Complexity of the patient's impairments (multi-system involvement)   OT goals addressed during session: ADL's and self-care                       End of Session Equipment Utilized During Treatment: Rolling walker (2 wheels)  Activity Tolerance: Patient tolerated treatment well Patient left: in chair;with call bell/phone within reach;with chair alarm set  OT Visit Diagnosis: Unsteadiness on feet (R26.81);Other abnormalities of gait and mobility (R26.89);Muscle weakness (generalized) (M62.81);History of falling (Z91.81);Other symptoms and signs involving cognitive function                Time: 1610-9604 OT Time Calculation (min): 17 min Charges:  OT General Charges $OT Visit: 1 Visit OT Evaluation $OT Eval Moderate Complexity: 1 Mod  Luian Schumpert OT, MOT   Danie Chandler 06/18/2023, 11:17 AM

## 2023-06-18 NOTE — Progress Notes (Signed)
 Physical Therapy Treatment Patient Details Name: Paula Andrews MRN: [id number] DOB: April 28, 1945 Today's Date: 06/18/2023   History of Present Illness Paula Andrews is a 78 y.o. female with medical history significant for GERD, anxiety, osteoarthritis, dementia, depression, type 2 diabetes mellitus, essential hypertension, hypothyroidism and seasonal allergies, who presented to the emergency room with acute onset of unwitnessed fall at her ALF likely mechanical fall.  She apparently hit her head but there was no presyncope or syncope documented.  The patient has advanced dementia and therefore is a 9 historian.  No fever or chills.  Her daughter stated that this is her second fall this week.  No nausea or vomiting or abdominal pain were reported.  No fever or chills.  No reported dysuria, oliguria or hematuria or flank pain.  No reported cough or wheezing or dyspnea. Status post hemiarthroplasty R hip.    PT Comments  Patient unable to maintain stand balance using RW due to left foot siding forward, required Max assist stand pivot with left knee blocked to transfer to chair.  Patient tolerated sitting up in chair after therapy - nursing staff aware. Patient will benefit from continued skilled physical therapy in hospital and recommended venue below to increase strength, balance, endurance for safe ADLs and gait.      If plan is discharge home, recommend the following: A lot of help with bathing/dressing/bathroom;A lot of help with walking and/or transfers;Help with stairs or ramp for entrance;Assistance with cooking/housework   Can travel by private vehicle     No  Equipment Recommendations  None recommended by PT    Recommendations for Other Services       Precautions / Restrictions Precautions Precautions: Fall Recall of Precautions/Restrictions: Impaired Precaution/Restrictions Comments: Posterior hip precaution Restrictions RLE Weight Bearing Per Provider Order: Weight bearing as  tolerated     Mobility  Bed Mobility Overal bed mobility: Needs Assistance Bed Mobility: Supine to Sit     Supine to sit: Max assist     General bed mobility comments: slow labored movement with poor tolerance for moving RLE due to increased pain    Transfers Overall transfer level: Needs assistance Equipment used: Rolling walker (2 wheels) Transfers: Sit to/from Stand, Bed to chair/wheelchair/BSC Sit to Stand: Max assist Stand pivot transfers: Max assist         General transfer comment: Patient unable to maintain standing balance using RW due to left foot sliding forward, required stand pivot with left knee blocked to transfer to chair    Ambulation/Gait                   Stairs             Wheelchair Mobility     Tilt Bed    Modified Rankin (Stroke Patients Only)       Balance Overall balance assessment: Needs assistance Sitting-balance support: Feet supported, No upper extremity supported Sitting balance-Leahy Scale: Poor Sitting balance - Comments: seated at EOB Postural control: Posterior lean, Left lateral lean Standing balance support: Reliant on assistive device for balance, During functional activity, Bilateral upper extremity supported Standing balance-Leahy Scale: Poor Standing balance comment: using RW                            Communication Communication Communication: Other (comment) Factors Affecting Communication: Difficulty expressing self;Reduced clarity of speech  Cognition Arousal: Lethargic Behavior During Therapy: Flat affect   PT - Cognitive impairments: History  of cognitive impairments                         Following commands: Impaired Following commands impaired: Follows one step commands inconsistently    Cueing Cueing Techniques: Verbal cues, Tactile cues  Exercises      General Comments        Pertinent Vitals/Pain Pain Assessment Pain Assessment: Faces Faces Pain Scale:  Hurts little more Pain Location: R hip Pain Descriptors / Indicators: Discomfort, Sore Pain Intervention(s): Limited activity within patient's tolerance, Monitored during session, Repositioned    Home Living Family/patient expects to be discharged to:: Assisted living                 Home Equipment: Other (comment) Additional Comments: Pt poor historian.    Prior Function            PT Goals (current goals can now be found in the care plan section) Acute Rehab PT Goals PT Goal Formulation: With patient Time For Goal Achievement: 06/30/23 Potential to Achieve Goals: Good Progress towards PT goals: Progressing toward goals    Frequency    Min 3X/week      PT Plan      Co-evaluation PT/OT/SLP Co-Evaluation/Treatment: Yes Reason for Co-Treatment: Complexity of the patient's impairments (multi-system involvement) PT goals addressed during session: Mobility/safety with mobility;Balance;Proper use of DME OT goals addressed during session: ADL's and self-care      AM-PAC PT "6 Clicks" Mobility   Outcome Measure  Help needed turning from your back to your side while in a flat bed without using bedrails?: A Lot Help needed moving from lying on your back to sitting on the side of a flat bed without using bedrails?: A Lot Help needed moving to and from a bed to a chair (including a wheelchair)?: A Lot Help needed standing up from a chair using your arms (e.g., wheelchair or bedside chair)?: A Lot Help needed to walk in hospital room?: Total Help needed climbing 3-5 steps with a railing? : Total 6 Click Score: 10    End of Session Equipment Utilized During Treatment: Oxygen Activity Tolerance: Patient tolerated treatment well;Patient limited by fatigue;Patient limited by pain Patient left: in chair;with call bell/phone within reach Nurse Communication: Mobility status PT Visit Diagnosis: Other abnormalities of gait and mobility (R26.89);History of falling  (Z91.81);Muscle weakness (generalized) (M62.81);Difficulty in walking, not elsewhere classified (R26.2)     Time: 1610-9604 PT Time Calculation (min) (ACUTE ONLY): 21 min  Charges:    $Therapeutic Activity: 8-22 mins PT General Charges $$ ACUTE PT VISIT: 1 Visit                     2:27 PM, 06/18/23 Ocie Bob, MPT Physical Therapist with [facility] [phone] office [phone] mobile phone

## 2023-06-18 NOTE — Progress Notes (Signed)
 Mobility Specialist Progress Note:    06/18/23 1541  Mobility  Activity Transferred from chair to bed  Level of Assistance Dependent, patient does less than 25%  Assistive Device Front wheel walker  Distance Ambulated (ft) 1 ft  Range of Motion/Exercises Active Assistive;All extremities  RLE Weight Bearing Per Provider Order WBAT  Activity Response Tolerated well  Mobility Referral No  Mobility visit 1 Mobility  Mobility Specialist Start Time (ACUTE ONLY) 1515  Mobility Specialist Stop Time (ACUTE ONLY) 1530  Mobility Specialist Time Calculation (min) (ACUTE ONLY) 15 min   Pt received in chair, NT requesting assistance to transfer to bed. Pt was dependent, requiring +2 to stand and transfer with RW. Tolerated well, pt lethargic. Left pt supine, alarm on. All needs met.   Lawerance Bach Mobility Specialist Please contact via Special educational needs teacher or  Rehab office at [phone]

## 2023-06-19 DIAGNOSIS — F03918 Unspecified dementia, unspecified severity, with other behavioral disturbance: Secondary | ICD-10-CM | POA: Diagnosis not present

## 2023-06-19 DIAGNOSIS — E039 Hypothyroidism, unspecified: Secondary | ICD-10-CM | POA: Diagnosis not present

## 2023-06-19 DIAGNOSIS — I10 Essential (primary) hypertension: Secondary | ICD-10-CM | POA: Diagnosis not present

## 2023-06-19 DIAGNOSIS — S72001A Fracture of unspecified part of neck of right femur, initial encounter for closed fracture: Secondary | ICD-10-CM | POA: Diagnosis not present

## 2023-06-19 LAB — CULTURE, BLOOD (ROUTINE X 2)
Culture: NO GROWTH
Culture: NO GROWTH
Special Requests: ADEQUATE

## 2023-06-19 MED ORDER — ASPIRIN 81 MG PO TBEC: 81.0000 mg | DELAYED_RELEASE_TABLET | Freq: Two times a day (BID) | ORAL | Status: DC

## 2023-06-19 MED ORDER — ENSURE ENLIVE PO LIQD: 237.0000 mL | Freq: Two times a day (BID) | ORAL | Status: DC

## 2023-06-19 MED ORDER — LISINOPRIL 5 MG PO TABS: 5.0000 mg | ORAL_TABLET | Freq: Every morning | ORAL | Status: DC

## 2023-06-19 MED ORDER — LORAZEPAM 0.5 MG PO TABS: 0.5000 mg | ORAL_TABLET | Freq: Three times a day (TID) | ORAL | 0 refills | Status: DC | PRN

## 2023-06-19 MED ORDER — MEMANTINE HCL ER 21 MG PO CP24: 21.0000 mg | ORAL_CAPSULE | Freq: Every morning | ORAL | Status: AC

## 2023-06-19 MED ORDER — OMEPRAZOLE MAGNESIUM 20 MG PO TBEC
20.0000 mg | DELAYED_RELEASE_TABLET | Freq: Every morning | ORAL | Status: AC
Start: 2023-06-19 — End: 2024-06-18

## 2023-06-19 NOTE — Discharge Summary (Signed)
 Physician Discharge Summary  LYLE LEISNER HYQ:[id number] DOB: 1946-03-31 DOA: 06/14/2023  Admit date: 06/14/2023 Discharge date: 06/19/2023  Disposition:  SNF   Recommendations for Outpatient Follow-up:  Follow up with Dr. Dallas Schimke in 6 weeks around 07/27/23 Please remove staples on 06/29/23   Orthopedic instructions from Dr. Dallas Schimke Weightbearing: WBAT RLE Incisional and dressing care: Reinforce dressings as needed Orthopedic device(s):  Hip abduction pillow in place at all times when in bed.  VTE prophylaxis: Recommend 81 mg aspirin BID Pain control: As needed Follow - up plan: 2 weeks for staple removal and XR.  IF DC to a facility, staples can be removed ~2 weeks after surgery, and follow up can be 6 weeks after surgery.  If there are any concerns, I am happy to see her in clinic at any time.   Discharge Condition: STABLE   CODE STATUS: DNR DIET:  Dysphagia 2, thin liquids  Brief Hospitalization Summary: Please see all hospital notes, images, labs for full details of the hospitalization.  Brief Admission History:  78 y.o. female with medical history significant for GERD, anxiety, osteoarthritis, dementia, depression, type 2 diabetes mellitus, essential hypertension, hypothyroidism and seasonal allergies, who presented to the emergency room with acute onset of unwitnessed fall at her ALF likely mechanical fall.  She apparently hit her head but there was no presyncope or syncope documented.  The patient has advanced dementia and therefore is a 9 historian.  No fever or chills.  Her daughter stated that this is her second fall this week.  No nausea or vomiting or abdominal pain were reported.  No fever or chills.  No reported dysuria, oliguria or hematuria or flank pain.  No reported cough or wheezing or dyspnea.   ED Course: When the patient came to the ER, BP was 141/65 and BP 160/79 with otherwise unremarkable vital signs.  Labs revealed mild hypokalemia of 3.4 and blood glucose of 165, BUN  of 24 and creatinine 1.1.  CBC showed leukocytosis of 11.6 and hemoglobin 10.6 hematocrit 34.2 lower than previous levels.     EKG as reviewed by me : EKG showed normal sinus rhythm with a rate of 73 with minimal voltage criteria for LVH. Imaging: Pelvic and hip x-ray showed displaced right femoral neck fracture.Portable chest x-ray showed low lung volumes and bronchovascular crowding versus vascular congestion.   The patient will be admitted to a telemetry bed for further evaluation and management.   Assessment and Plan:   Closed right hip fracture s/p ORIF 3/14 with Dr. Dallas Schimke - Pain management will be provided. - Orthopedic consult to be obtained. - Dr. Dallas Schimke postop orders in place -- POD#4 pt now starting to take oral medication, continue aspirin 81 mg BID for DVT prevention starting 3/17 -- PLEASE REMOVE STAPLES AT SNF ON 06/29/23    Dementia with behavioral disturbance  - continue the Namenda and Zyprexa.   Type 2 diabetes mellitus without complications  - has been diet controlled with poor oral intake, holding all oral meds and insulin for now -- PLEASE MONITOR BLOOD GLUCOSE AT LEAST ONCE PER DAY AND PRN IF NOT FEELING WELL  Essential hypertension - continue antihypertensive therapy.   Anxiety and depression - continue behavioral health meds as ordered     Hypokalemia - Repleted    Hypothyroidism - Continue Synthroid.  Discharge Diagnoses:  Principal Problem:   Closed right hip fracture, initial encounter [facility]) Active Problems:   Hypothyroidism   Hypokalemia   Anxiety and depression   Essential hypertension  Type 2 diabetes mellitus without complications (HCC)   Dementia with behavioral disturbance [facility])   Discharge Instructions:  Allergies as of 06/19/2023   No Known Allergies      Medication List     STOP taking these medications    busPIRone 5 MG tablet Commonly known as: BUSPAR   famotidine 20 MG tablet Commonly known as: PEPCID    glipiZIDE-metformin 2.5-500 MG tablet Commonly known as: METAGLIP   hydrochlorothiazide 12.5 MG tablet Commonly known as: HYDRODIURIL   magnesium hydroxide 400 MG/5ML suspension Commonly known as: MILK OF MAGNESIA   metFORMIN 500 MG tablet Commonly known as: GLUCOPHAGE   mirtazapine 15 MG tablet Commonly known as: REMERON   venlafaxine XR 37.5 MG 24 hr capsule Commonly known as: EFFEXOR-XR       TAKE these medications    acetaminophen 325 MG tablet Commonly known as: TYLENOL Take 2 tablets (650 mg total) by mouth 2 (two) times daily as needed for mild pain or moderate pain.   aspirin EC 81 MG tablet Take 1 tablet (81 mg total) by mouth 2 (two) times daily. Swallow whole.   escitalopram 10 MG tablet Commonly known as: LEXAPRO Take 10 mg by mouth daily.   feeding supplement Liqd Take 237 mLs by mouth 2 (two) times daily between meals.   levothyroxine 75 MCG tablet Commonly known as: SYNTHROID Take 75 mcg by mouth daily. What changed: Another medication with the same name was removed. Continue taking this medication, and follow the directions you see here.   lisinopril 5 MG tablet Commonly known as: ZESTRIL Take 1 tablet (5 mg total) by mouth every morning. What changed:  when to take this Another medication with the same name was removed. Continue taking this medication, and follow the directions you see here.   loperamide 2 MG capsule Commonly known as: IMODIUM Take 2 mg by mouth as needed for diarrhea or loose stools (max 8 doses in 24 hours).   LORazepam 0.5 MG tablet Commonly known as: ATIVAN Take 1 tablet (0.5 mg total) by mouth every 8 (eight) hours as needed for anxiety, seizure, sleep or sedation. What changed: reasons to take this   melatonin 1 MG Tabs tablet Take 1 mg by mouth at bedtime.   memantine 21 MG Cp24 24 hr capsule Commonly known as: NAMENDA XR Take 1 capsule (21 mg total) by mouth every morning.   Mi-Acid 200-200-20 MG/5ML  suspension Generic drug: alum & mag hydroxide-simeth Take 30 mLs by mouth every 6 (six) hours as needed for indigestion or heartburn.   OLANZapine 10 MG tablet Commonly known as: ZYPREXA Take 10 mg by mouth at bedtime. What changed: Another medication with the same name was removed. Continue taking this medication, and follow the directions you see here.   omeprazole 20 MG tablet Commonly known as: PriLOSEC OTC Take 1 tablet (20 mg total) by mouth every morning.   ondansetron 4 MG tablet Commonly known as: ZOFRAN Take 4 mg by mouth every 4 (four) hours as needed.   Vitamin D (Cholecalciferol) 10 MCG (400 UNIT) Tabs Take 800 Units by mouth every morning.        Follow-up Information     Oliver Barre, MD Follow up in 5 week(s).   Specialties: Orthopedic Surgery, Sports Medicine Why: Hospital Follow Up Contact information: [address] [place] [address] [phone]                No Known Allergies Allergies as of 06/19/2023  No Known Allergies      Medication List     STOP taking these medications    busPIRone 5 MG tablet Commonly known as: BUSPAR   famotidine 20 MG tablet Commonly known as: PEPCID   glipiZIDE-metformin 2.5-500 MG tablet Commonly known as: METAGLIP   hydrochlorothiazide 12.5 MG tablet Commonly known as: HYDRODIURIL   magnesium hydroxide 400 MG/5ML suspension Commonly known as: MILK OF MAGNESIA   metFORMIN 500 MG tablet Commonly known as: GLUCOPHAGE   mirtazapine 15 MG tablet Commonly known as: REMERON   venlafaxine XR 37.5 MG 24 hr capsule Commonly known as: EFFEXOR-XR       TAKE these medications    acetaminophen 325 MG tablet Commonly known as: TYLENOL Take 2 tablets (650 mg total) by mouth 2 (two) times daily as needed for mild pain or moderate pain.   aspirin EC 81 MG tablet Take 1 tablet (81 mg total) by mouth 2 (two) times daily. Swallow whole.   escitalopram 10 MG tablet Commonly known as:  LEXAPRO Take 10 mg by mouth daily.   feeding supplement Liqd Take 237 mLs by mouth 2 (two) times daily between meals.   levothyroxine 75 MCG tablet Commonly known as: SYNTHROID Take 75 mcg by mouth daily. What changed: Another medication with the same name was removed. Continue taking this medication, and follow the directions you see here.   lisinopril 5 MG tablet Commonly known as: ZESTRIL Take 1 tablet (5 mg total) by mouth every morning. What changed:  when to take this Another medication with the same name was removed. Continue taking this medication, and follow the directions you see here.   loperamide 2 MG capsule Commonly known as: IMODIUM Take 2 mg by mouth as needed for diarrhea or loose stools (max 8 doses in 24 hours).   LORazepam 0.5 MG tablet Commonly known as: ATIVAN Take 1 tablet (0.5 mg total) by mouth every 8 (eight) hours as needed for anxiety, seizure, sleep or sedation. What changed: reasons to take this   melatonin 1 MG Tabs tablet Take 1 mg by mouth at bedtime.   memantine 21 MG Cp24 24 hr capsule Commonly known as: NAMENDA XR Take 1 capsule (21 mg total) by mouth every morning.   Mi-Acid 200-200-20 MG/5ML suspension Generic drug: alum & mag hydroxide-simeth Take 30 mLs by mouth every 6 (six) hours as needed for indigestion or heartburn.   OLANZapine 10 MG tablet Commonly known as: ZYPREXA Take 10 mg by mouth at bedtime. What changed: Another medication with the same name was removed. Continue taking this medication, and follow the directions you see here.   omeprazole 20 MG tablet Commonly known as: PriLOSEC OTC Take 1 tablet (20 mg total) by mouth every morning.   ondansetron 4 MG tablet Commonly known as: ZOFRAN Take 4 mg by mouth every 4 (four) hours as needed.   Vitamin D (Cholecalciferol) 10 MCG (400 UNIT) Tabs Take 800 Units by mouth every morning.        Procedures/Studies: DG HIP UNILAT WITH PELVIS 2-3 VIEWS RIGHT Result  Date: 06/15/2023 CLINICAL DATA:  Closed displaced fracture of right femoral neck, postop. EXAM: DG HIP (WITH OR WITHOUT PELVIS) 2-3V RIGHT COMPARISON:  Preoperative imaging. FINDINGS: Right hip arthroplasty in expected alignment. No periprosthetic lucency or fracture. Recent postsurgical change includes air and edema in the soft tissues. Lateral skin staples in place. IMPRESSION: Right hip arthroplasty without immediate postoperative complication. Electronically Signed   By: Narda Rutherford M.D.   On:  06/15/2023 15:17   CT Cervical Spine Wo Contrast Result Date: 06/14/2023 CLINICAL DATA:  Neck trauma (Age >= 65y) EXAM: CT CERVICAL SPINE WITHOUT CONTRAST TECHNIQUE: Multidetector CT imaging of the cervical spine was performed without intravenous contrast. Multiplanar CT image reconstructions were also generated. RADIATION DOSE REDUCTION: This exam was performed according to the departmental dose-optimization program which includes automated exposure control, adjustment of the mA and/or kV according to patient size and/or use of iterative reconstruction technique. COMPARISON:  02/08/2023 FINDINGS: Alignment: Straightening of normal lordosis. Unchanged trace anterolisthesis of C3 on C4. No traumatic subluxation. Skull base and vertebrae: No acute fracture. Vertebral body heights are maintained. The dens and skull base are intact. Soft tissues and spinal canal: No prevertebral fluid or swelling. No visible canal hematoma. Disc levels: Stable degenerative disc disease, most prominent C4-C5 and C6-C7. Upper chest: Emphysema. Other: Carotid calcifications. IMPRESSION: 1. No acute fracture or subluxation of the cervical spine. 2. Stable degenerative disc disease. Electronically Signed   By: Narda Rutherford M.D.   On: 06/14/2023 19:36   CT Head Wo Contrast Result Date: 06/14/2023 CLINICAL DATA:  Head trauma, intracranial arterial injury suspected EXAM: CT HEAD WITHOUT CONTRAST TECHNIQUE: Contiguous axial images were  obtained from the base of the skull through the vertex without intravenous contrast. RADIATION DOSE REDUCTION: This exam was performed according to the departmental dose-optimization program which includes automated exposure control, adjustment of the mA and/or kV according to patient size and/or use of iterative reconstruction technique. COMPARISON:  02/08/2023 FINDINGS: Brain: No intracranial hemorrhage, mass effect, or midline shift. Stable degree of atrophy no hydrocephalus. The basilar cisterns are patent. Unchanged periventricular and deep chronic small vessel ischemia. No evidence of territorial infarct or acute ischemia. No extra-axial or intracranial fluid collection. Vascular: Atherosclerosis of skullbase vasculature without hyperdense vessel or abnormal calcification. Skull: No fracture or focal lesion. Sinuses/Orbits: Paranasal sinuses and mastoid air cells are clear. The visualized orbits are unremarkable. Other: None. IMPRESSION: 1. No acute intracranial abnormality. No skull fracture. 2. Unchanged atrophy and chronic small vessel ischemia. Electronically Signed   By: Narda Rutherford M.D.   On: 06/14/2023 19:31   DG Chest Portable 1 View Result Date: 06/14/2023 CLINICAL DATA:  Hypoxia. EXAM: PORTABLE CHEST 1 VIEW COMPARISON:  10/08/2022 FINDINGS: Lung volumes are low. Stable heart size and mediastinal contours. Mild hypoventilatory changes at the lung bases. Bronchovascular crowding versus vascular congestion. No pneumothorax or large pleural effusion. On limited assessment, no acute osseous findings. IMPRESSION: Low lung volumes with hypoventilatory changes at the lung bases. Bronchovascular crowding versus vascular congestion. Electronically Signed   By: Narda Rutherford M.D.   On: 06/14/2023 17:38   DG HIP UNILAT WITH PELVIS 2-3 VIEWS LEFT Result Date: 06/14/2023 CLINICAL DATA:  Fall, does not want ambulate. EXAM: DG HIP (WITH OR WITHOUT PELVIS) 2-3V RIGHT; DG HIP (WITH OR WITHOUT PELVIS)  2-3V LEFT COMPARISON:  None Available. FINDINGS: Right hip: Displaced right femoral neck fracture. Proximal migration of the femoral shaft. The femoral head remains seated. Left hip: No acute fracture or dislocation. Mild hip joint space narrowing. No erosive change. Pelvis: No pelvic fracture. Pubic rami are intact. The pubic symphysis and sacroiliac joints are congruent. Mild overlying artifact from clothing. IMPRESSION: 1. Displaced right femoral neck fracture. 2. No acute fracture of the left hip or pelvis. Electronically Signed   By: Narda Rutherford M.D.   On: 06/14/2023 17:37   DG HIP UNILAT WITH PELVIS 2-3 VIEWS RIGHT Result Date: 06/14/2023 CLINICAL DATA:  Fall, does  not want ambulate. EXAM: DG HIP (WITH OR WITHOUT PELVIS) 2-3V RIGHT; DG HIP (WITH OR WITHOUT PELVIS) 2-3V LEFT COMPARISON:  None Available. FINDINGS: Right hip: Displaced right femoral neck fracture. Proximal migration of the femoral shaft. The femoral head remains seated. Left hip: No acute fracture or dislocation. Mild hip joint space narrowing. No erosive change. Pelvis: No pelvic fracture. Pubic rami are intact. The pubic symphysis and sacroiliac joints are congruent. Mild overlying artifact from clothing. IMPRESSION: 1. Displaced right femoral neck fracture. 2. No acute fracture of the left hip or pelvis. Electronically Signed   By: Narda Rutherford M.D.   On: 06/14/2023 17:37     Subjective: Pt has been eating and drinking better and taking oral pills better.   Discharge Exam: Vitals:   06/18/23 1939 06/19/23 0527  BP: (!) 124/52 (!) 145/63  Pulse: 87 100  Resp: 18 (!) 22  Temp: 98.5 F (36.9 C) 98.5 F (36.9 C)  SpO2: 91% 95%   Vitals:   06/18/23 0929 06/18/23 1252 06/18/23 1939 06/19/23 0527  BP: (!) 103/47 138/68 (!) 124/52 (!) 145/63  Pulse:  100 87 100  Resp:   18 (!) 22  Temp:  (!) 97.5 F (36.4 C) 98.5 F (36.9 C) 98.5 F (36.9 C)  TempSrc:  Axillary Oral Oral  SpO2:  99% 91% 95%  Weight:       Height:       General exam: Appears calm and comfortable she is mostly nonverbal with advanced dementia. Respiratory system: Clear to auscultation. Respiratory effort normal. Cardiovascular system: normal S1 & S2 heard. No JVD, murmurs, rubs, gallops or clicks. No pedal edema. Gastrointestinal system: Abdomen is nondistended, soft and nontender. No organomegaly or masses felt. Normal bowel sounds heard. Central nervous system: Alert with advanced dementia. No focal neurological deficits. Extremities: wounds clean, dry and intact. Skin: No rashes, lesions or ulcers. Psychiatry: Judgement and insight appear severely diminished. Mood & affect flat.    The results of significant diagnostics from this hospitalization (including imaging, microbiology, ancillary and laboratory) are listed below for reference.     Microbiology: Recent Results (from the past 240 hours)  Culture, blood (Routine X 2) w Reflex to ID Panel     Status: None   Collection Time: 06/14/23 10:17 PM   Specimen: Left Antecubital; Blood  Result Value Ref Range Status   Specimen Description   Final    LEFT ANTECUBITAL BOTTLES DRAWN AEROBIC AND ANAEROBIC   Special Requests   Final    Blood Culture results may not be optimal due to an inadequate volume of blood received in culture bottles   Culture   Final    NO GROWTH 5 DAYS Performed at [facility], [address], [place], [place] [address]    Report Status 06/19/2023 FINAL  Final  Culture, blood (Routine X 2) w Reflex to ID Panel     Status: None   Collection Time: 06/14/23 10:17 PM   Specimen: BLOOD LEFT WRIST  Result Value Ref Range Status   Specimen Description   Final    BLOOD LEFT WRIST BOTTLES DRAWN AEROBIC AND ANAEROBIC   Special Requests Blood Culture adequate volume  Final   Culture   Final    NO GROWTH 5 DAYS Performed at [facility], [address], [place], [place] [address]    Report Status 06/19/2023 FINAL  Final  Resp panel by RT-PCR (RSV, Flu  A&B, Covid) Anterior Nasal Swab     Status: None  Collection Time: 06/14/23 10:20 PM   Specimen: Anterior Nasal Swab  Result Value Ref Range Status   SARS Coronavirus 2 by RT PCR NEGATIVE NEGATIVE Final    Comment: (NOTE) SARS-CoV-2 target nucleic acids are NOT DETECTED.  The SARS-CoV-2 RNA is generally detectable in upper respiratory specimens during the acute phase of infection. The lowest concentration of SARS-CoV-2 viral copies this assay can detect is 138 copies/mL. A negative result does not preclude SARS-Cov-2 infection and should not be used as the sole basis for treatment or other patient management decisions. A negative result may occur with  improper specimen collection/handling, submission of specimen other than nasopharyngeal swab, presence of viral mutation(s) within the areas targeted by this assay, and inadequate number of viral copies(<138 copies/mL). A negative result must be combined with clinical observations, patient history, and epidemiological information. The expected result is Negative.  Fact Sheet for Patients:  BloggerCourse.com  Fact Sheet for Healthcare Providers:  SeriousBroker.it  This test is no t yet approved or cleared by the [place] FDA and  has been authorized for detection and/or diagnosis of SARS-CoV-2 by FDA under an Emergency Use Authorization (EUA). This EUA will remain  in effect (meaning this test can be used) for the duration of the COVID-19 declaration under Section 564(b)(1) of the Act, 21 U.S.C.section 360bbb-3(b)(1), unless the authorization is terminated  or revoked sooner.       Influenza A by PCR NEGATIVE NEGATIVE Final   Influenza B by PCR NEGATIVE NEGATIVE Final    Comment: (NOTE) The Xpert Xpress SARS-CoV-2/FLU/RSV plus assay is intended as an aid in the diagnosis of influenza from Nasopharyngeal swab specimens and should not be used as a sole basis for treatment.  Nasal washings and aspirates are unacceptable for Xpert Xpress SARS-CoV-2/FLU/RSV testing.  Fact Sheet for Patients: BloggerCourse.com  Fact Sheet for Healthcare Providers: SeriousBroker.it  This test is not yet approved or cleared by the [place] FDA and has been authorized for detection and/or diagnosis of SARS-CoV-2 by FDA under an Emergency Use Authorization (EUA). This EUA will remain in effect (meaning this test can be used) for the duration of the COVID-19 declaration under Section 564(b)(1) of the Act, 21 U.S.C. section 360bbb-3(b)(1), unless the authorization is terminated or revoked.     Resp Syncytial Virus by PCR NEGATIVE NEGATIVE Final    Comment: (NOTE) Fact Sheet for Patients: BloggerCourse.com  Fact Sheet for Healthcare Providers: SeriousBroker.it  This test is not yet approved or cleared by the [place] FDA and has been authorized for detection and/or diagnosis of SARS-CoV-2 by FDA under an Emergency Use Authorization (EUA). This EUA will remain in effect (meaning this test can be used) for the duration of the COVID-19 declaration under Section 564(b)(1) of the Act, 21 U.S.C. section 360bbb-3(b)(1), unless the authorization is terminated or revoked.  Performed at [facility], [address] [place], [place] [address]   Surgical pcr screen     Status: None   Collection Time: 06/14/23 11:46 PM   Specimen: Nasal Mucosa; Nasal Swab  Result Value Ref Range Status   MRSA, PCR NEGATIVE NEGATIVE Final   Staphylococcus aureus NEGATIVE NEGATIVE Final    Comment: (NOTE) The Xpert SA Assay (FDA approved for NASAL specimens in patients 9 years of age and older), is one component of a comprehensive surveillance program. It is not intended to diagnose infection nor to guide or monitor treatment. Performed at [facility], [address] [place], [place]  [address]  Labs: BNP (last 3 results) No results for input(s): "BNP" in the last 8760 hours. Basic Metabolic Panel: Recent Labs  Lab 06/14/23 1545 06/15/23 0418 06/16/23 0329 06/18/23 0418  NA 138 140 139 141  K 3.4* 4.0 4.0 3.9  CL 102 109 109 111  CO2 24 23 22 22   GLUCOSE 165* 127* 157* 112*  BUN 24* 22 22 33*  CREATININE 1.10* 0.79 0.91 0.81  CALCIUM 9.1 8.8* 8.4* 8.5*  MG 1.8  --   --   --    Liver Function Tests: No results for input(s): "AST", "ALT", "ALKPHOS", "BILITOT", "PROT", "ALBUMIN" in the last 168 hours. No results for input(s): "LIPASE", "AMYLASE" in the last 168 hours. No results for input(s): "AMMONIA" in the last 168 hours. CBC: Recent Labs  Lab 06/14/23 1545 06/15/23 0418 06/16/23 0329 06/18/23 0418  WBC 11.6* 9.3 8.4 8.1  HGB 10.6* 10.6* 8.9* 8.3*  HCT 34.2* 34.4* 29.8* 28.8*  MCV 85.7 87.5 90.0 91.7  PLT 200 203 167 181   Cardiac Enzymes: No results for input(s): "CKTOTAL", "CKMB", "CKMBINDEX", "TROPONINI" in the last 168 hours. BNP: Invalid input(s): "POCBNP" CBG: Recent Labs  Lab 06/15/23 1325 06/15/23 1430 06/17/23 1126  GLUCAP 119* 99 143*   D-Dimer No results for input(s): "DDIMER" in the last 72 hours. Hgb A1c No results for input(s): "HGBA1C" in the last 72 hours. Lipid Profile No results for input(s): "CHOL", "HDL", "LDLCALC", "TRIG", "CHOLHDL", "LDLDIRECT" in the last 72 hours. Thyroid function studies No results for input(s): "TSH", "T4TOTAL", "T3FREE", "THYROIDAB" in the last 72 hours.  Invalid input(s): "FREET3" Anemia work up No results for input(s): "VITAMINB12", "FOLATE", "FERRITIN", "TIBC", "IRON", "RETICCTPCT" in the last 72 hours. Urinalysis    Component Value Date/Time   COLORURINE YELLOW 06/15/2023 0227   APPEARANCEUR CLEAR 06/15/2023 0227   LABSPEC 1.018 06/15/2023 0227   PHURINE 6.0 06/15/2023 0227   GLUCOSEU NEGATIVE 06/15/2023 0227   HGBUR NEGATIVE 06/15/2023 0227   BILIRUBINUR NEGATIVE 06/15/2023  0227   BILIRUBINUR small (A) 02/26/2019 1141   KETONESUR NEGATIVE 06/15/2023 0227   PROTEINUR 100 (A) 06/15/2023 0227   UROBILINOGEN 1.0 02/26/2019 1141   NITRITE NEGATIVE 06/15/2023 0227   LEUKOCYTESUR NEGATIVE 06/15/2023 0227   Sepsis Labs Recent Labs  Lab 06/14/23 1545 06/15/23 0418 06/16/23 0329 06/18/23 0418  WBC 11.6* 9.3 8.4 8.1   Microbiology Recent Results (from the past 240 hours)  Culture, blood (Routine X 2) w Reflex to ID Panel     Status: None   Collection Time: 06/14/23 10:17 PM   Specimen: Left Antecubital; Blood  Result Value Ref Range Status   Specimen Description   Final    LEFT ANTECUBITAL BOTTLES DRAWN AEROBIC AND ANAEROBIC   Special Requests   Final    Blood Culture results may not be optimal due to an inadequate volume of blood received in culture bottles   Culture   Final    NO GROWTH 5 DAYS Performed at [facility], [address], [place], [place] [address]    Report Status 06/19/2023 FINAL  Final  Culture, blood (Routine X 2) w Reflex to ID Panel     Status: None   Collection Time: 06/14/23 10:17 PM   Specimen: BLOOD LEFT WRIST  Result Value Ref Range Status   Specimen Description   Final    BLOOD LEFT WRIST BOTTLES DRAWN AEROBIC AND ANAEROBIC   Special Requests Blood Culture adequate volume  Final   Culture   Final    NO GROWTH  5 DAYS Performed at [facility], [address], [place], [place] [address]    Report Status 06/19/2023 FINAL  Final  Resp panel by RT-PCR (RSV, Flu A&B, Covid) Anterior Nasal Swab     Status: None   Collection Time: 06/14/23 10:20 PM   Specimen: Anterior Nasal Swab  Result Value Ref Range Status   SARS Coronavirus 2 by RT PCR NEGATIVE NEGATIVE Final    Comment: (NOTE) SARS-CoV-2 target nucleic acids are NOT DETECTED.  The SARS-CoV-2 RNA is generally detectable in upper respiratory specimens during the acute phase of infection. The lowest concentration of SARS-CoV-2 viral copies this assay can detect is 138  copies/mL. A negative result does not preclude SARS-Cov-2 infection and should not be used as the sole basis for treatment or other patient management decisions. A negative result may occur with  improper specimen collection/handling, submission of specimen other than nasopharyngeal swab, presence of viral mutation(s) within the areas targeted by this assay, and inadequate number of viral copies(<138 copies/mL). A negative result must be combined with clinical observations, patient history, and epidemiological information. The expected result is Negative.  Fact Sheet for Patients:  BloggerCourse.com  Fact Sheet for Healthcare Providers:  SeriousBroker.it  This test is no t yet approved or cleared by the [place] FDA and  has been authorized for detection and/or diagnosis of SARS-CoV-2 by FDA under an Emergency Use Authorization (EUA). This EUA will remain  in effect (meaning this test can be used) for the duration of the COVID-19 declaration under Section 564(b)(1) of the Act, 21 U.S.C.section 360bbb-3(b)(1), unless the authorization is terminated  or revoked sooner.       Influenza A by PCR NEGATIVE NEGATIVE Final   Influenza B by PCR NEGATIVE NEGATIVE Final    Comment: (NOTE) The Xpert Xpress SARS-CoV-2/FLU/RSV plus assay is intended as an aid in the diagnosis of influenza from Nasopharyngeal swab specimens and should not be used as a sole basis for treatment. Nasal washings and aspirates are unacceptable for Xpert Xpress SARS-CoV-2/FLU/RSV testing.  Fact Sheet for Patients: BloggerCourse.com  Fact Sheet for Healthcare Providers: SeriousBroker.it  This test is not yet approved or cleared by the [place] FDA and has been authorized for detection and/or diagnosis of SARS-CoV-2 by FDA under an Emergency Use Authorization (EUA). This EUA will remain in effect (meaning  this test can be used) for the duration of the COVID-19 declaration under Section 564(b)(1) of the Act, 21 U.S.C. section 360bbb-3(b)(1), unless the authorization is terminated or revoked.     Resp Syncytial Virus by PCR NEGATIVE NEGATIVE Final    Comment: (NOTE) Fact Sheet for Patients: BloggerCourse.com  Fact Sheet for Healthcare Providers: SeriousBroker.it  This test is not yet approved or cleared by the [place] FDA and has been authorized for detection and/or diagnosis of SARS-CoV-2 by FDA under an Emergency Use Authorization (EUA). This EUA will remain in effect (meaning this test can be used) for the duration of the COVID-19 declaration under Section 564(b)(1) of the Act, 21 U.S.C. section 360bbb-3(b)(1), unless the authorization is terminated or revoked.  Performed at [facility], [address] [place], [place] [address]   Surgical pcr screen     Status: None   Collection Time: 06/14/23 11:46 PM   Specimen: Nasal Mucosa; Nasal Swab  Result Value Ref Range Status   MRSA, PCR NEGATIVE NEGATIVE Final   Staphylococcus aureus NEGATIVE NEGATIVE Final    Comment: (NOTE) The Xpert SA Assay (FDA approved for NASAL specimens in patients  40 years of age and older), is one component of a comprehensive surveillance program. It is not intended to diagnose infection nor to guide or monitor treatment. Performed at [facility], [address], [place], [place] [address]     Time coordinating discharge:  50 mins  SIGNED:  Standley Dakins, MD  [facility] 06/19/2023, 11:52 AM How to contact the [facility] Attending or Consulting provider 7A - 7P or covering provider during after hours 7P -7A, for this patient?  Check the care team in [facility] and look for a) attending/consulting TRH provider listed and b) the [facility] team listed Log into [email or web address] and use [place]'s universal password to access. If you do not have the  password, please contact the hospital operator. Locate the [facility] provider you are looking for under [facility] and page to a number that you can be directly reached. If you still have difficulty reaching the provider, please page the [facility] (Director on Call) for the Hospitalists listed on amion for assistance.

## 2023-06-19 NOTE — Progress Notes (Signed)
 [facility] here to transport pt for discharge. IV removed. VSS. O2 @ 2lpm [place] remains on. Daughter and granddaughter at bedside and aware of pending transfer. Family questions answered by assigned nurse, W Early, LPN.

## 2023-06-19 NOTE — Plan of Care (Signed)

## 2023-06-19 NOTE — Discharge Instructions (Addendum)
 Please remove staples on 06/29/23 per Dr. Dallas Schimke Follow up with Dr. Dallas Schimke around 07/27/23.   Orthopedic instructions Weightbearing: WBAT RLE Incisional and dressing care: Reinforce dressings as needed Orthopedic device(s):  Hip abduction pillow in place at all times when in bed.  VTE prophylaxis: Recommend 81 mg aspirin BID Pain control: As needed Follow - up plan: 2 weeks for staple removal and XR.  IF DC to a facility, staples can be removed ~2 weeks after surgery, and follow up can be 6 weeks after surgery.  If there are any concerns, I am happy to see her in clinic at any time.     IMPORTANT INFORMATION: PAY CLOSE ATTENTION   PHYSICIAN DISCHARGE INSTRUCTIONS  Follow with Primary care provider  Merlene Laughter, MD (Inactive)  and other consultants as instructed by your Hospitalist Physician  SEEK MEDICAL CARE OR RETURN TO EMERGENCY ROOM IF SYMPTOMS COME BACK, WORSEN OR NEW PROBLEM DEVELOPS   Please note: You were cared for by a hospitalist during your hospital stay. Every effort will be made to forward records to your primary care provider.  You can request that your primary care provider send for your hospital records if they have not received them.  Once you are discharged, your primary care physician will handle any further medical issues. Please note that NO REFILLS for any discharge medications will be authorized once you are discharged, as it is imperative that you return to your primary care physician (or establish a relationship with a primary care physician if you do not have one) for your post hospital discharge needs so that they can reassess your need for medications and monitor your lab values.  Please get a complete blood count and chemistry panel checked by your Primary MD at your next visit, and again as instructed by your Primary MD.  Get Medicines reviewed and adjusted: Please take all your medications with you for your next visit with your Primary  MD  Laboratory/radiological data: Please request your Primary MD to go over all hospital tests and procedure/radiological results at the follow up, please ask your primary care provider to get all Hospital records sent to his/her office.  In some cases, they will be blood work, cultures and biopsy results pending at the time of your discharge. Please request that your primary care provider follow up on these results.  If you are diabetic, please bring your blood sugar readings with you to your follow up appointment with primary care.    Please call and make your follow up appointments as soon as possible.    Also Note the following: If you experience worsening of your admission symptoms, develop shortness of breath, life threatening emergency, suicidal or homicidal thoughts you must seek medical attention immediately by calling 911 or calling your MD immediately  if symptoms less severe.  You must read complete instructions/literature along with all the possible adverse reactions/side effects for all the Medicines you take and that have been prescribed to you. Take any new Medicines after you have completely understood and accpet all the possible adverse reactions/side effects.   Do not drive when taking Pain medications or sleeping medications (Benzodiazepines)  Do not take more than prescribed Pain, Sleep and Anxiety Medications. It is not advisable to combine anxiety,sleep and pain medications without talking with your primary care practitioner  Special Instructions: If you have smoked or chewed Tobacco  in the last 2 yrs please stop smoking, stop any regular Alcohol  and or any Recreational drug use.  Wear Seat belts while driving.  Do not drive if taking any narcotic, mind altering or controlled substances or recreational drugs or alcohol.

## 2023-06-19 NOTE — Plan of Care (Signed)

## 2023-06-19 NOTE — Care Management Important Message (Signed)
 Important Message  Patient Details  Name: Paula Andrews MRN: [id number] Date of Birth: 06-20-1945   Important Message Given:  Yes - Medicare IM     Corey Harold 06/19/2023, 10:46 AM

## 2023-06-19 NOTE — Progress Notes (Signed)
 Report called to [place] at [facility].

## 2023-06-19 NOTE — TOC Transition Note (Signed)
 Transition of Care [facility]) - Discharge Note   Patient Details  Name: Paula Andrews MRN: [id number] Date of Birth: October 29, 1945  Transition of Care [facility]) CM/SW Contact:  Karn Cassis, LCSW Phone Number: 06/19/2023, 2:43 PM   Clinical Narrative: Pt d/c today to SNF. Reviewed bed offers with husband again who chooses [facility]. Facility can accept pt today. Pt will transport via [facility]. D/C summary sent to SNF. RN given number to call report.     Final next level of care: Skilled Nursing Facility Barriers to Discharge: Barriers Resolved   Patient Goals and CMS Choice Patient states their goals for this hospitalization and ongoing recovery are:: rehab   Choice offered to / list presented to : Spouse [name] ownership interest in [facility]provided to:: Spouse    Discharge Placement              Patient chooses bed at: Other - please specify in the comment section below: [facility]) Patient to be transferred to facility by: [facility] Name of family member notified: husband Patient and family notified of of transfer: 06/19/23  Discharge Plan and Services Additional resources added to the After Visit Summary for   In-house Referral: Clinical Social Work   Post Acute Care Choice: Skilled Nursing Facility                               Social Drivers of Health (SDOH) Interventions SDOH Screenings   Food Insecurity: Patient Unable To Answer (06/14/2023)  Housing: Patient Unable To Answer (06/14/2023)  Transportation Needs: Patient Unable To Answer (06/14/2023)  Utilities: Patient Unable To Answer (06/14/2023)  Social Connections: Patient Unable To Answer (06/14/2023)  Tobacco Use: Medium Risk (06/15/2023)     Readmission Risk Interventions    06/18/2023   10:05 AM 09/20/2021    3:04 PM  Readmission Risk Prevention Plan  Transportation Screening Complete Complete  PCP or Specialist Appt within 5-7 Days  Complete  Home  Care Screening  Complete  Medication Review (RN CM)  Complete  HRI or Home Care Consult Complete   Social Work Consult for Recovery Care Planning/Counseling Complete   Palliative Care Screening --   Medication Review Oceanographer) Complete

## 2023-06-29 ENCOUNTER — Encounter: Payer: Self-pay | Admitting: Orthopedic Surgery

## 2023-06-29 ENCOUNTER — Ambulatory Visit: Admitting: Orthopedic Surgery

## 2023-06-29 ENCOUNTER — Other Ambulatory Visit (INDEPENDENT_AMBULATORY_CARE_PROVIDER_SITE_OTHER): Payer: Self-pay

## 2023-06-29 DIAGNOSIS — S72001D Fracture of unspecified part of neck of right femur, subsequent encounter for closed fracture with routine healing: Secondary | ICD-10-CM | POA: Diagnosis not present

## 2023-06-29 DIAGNOSIS — I700 Atherosclerosis of aorta: Secondary | ICD-10-CM | POA: Insufficient documentation

## 2023-06-29 DIAGNOSIS — F5101 Primary insomnia: Secondary | ICD-10-CM | POA: Insufficient documentation

## 2023-06-29 DIAGNOSIS — F419 Anxiety disorder, unspecified: Secondary | ICD-10-CM | POA: Insufficient documentation

## 2023-06-29 DIAGNOSIS — G301 Alzheimer's disease with late onset: Secondary | ICD-10-CM | POA: Insufficient documentation

## 2023-06-29 DIAGNOSIS — N951 Menopausal and female climacteric states: Secondary | ICD-10-CM | POA: Insufficient documentation

## 2023-06-29 NOTE — Progress Notes (Signed)
 Orthopaedic Postop Note  Assessment: Paula Andrews is a 78 y.o. female s/p Right hip hemiarthroplasty  DOS: 06/15/2023  Plan: Cherlynn Polo removed, steri strips placed Continue using hip abduction pillow while in bed until 6 weeks after surgery Continue posterior hip precautions until 3 months postop Continue with DVT prophylaxis for at least 6 weeks after surgery WBAT on the operative extremity Follow up in 4 weeks; call with any issues   Follow-up: No follow-ups on file. XR at next visit: AP pelvis and Right hip  Subjective:  Chief Complaint  Patient presents with   Post-op Follow-up    Right hip     History of Present Illness: Paula Andrews is a 78 y.o. female who presents following the above stated procedure.  She was discharged to her nursing facility.  She is doing well.  She is ambulating with assistance.  She continues to receive some pain medications.  Family concerned about using narcotics too frequently.  Review of Systems: Unable to assess  Objective: There were no vitals taken for this visit.  Physical Exam:  Patient is demented.  She is seated in a wheelchair.  She is alert and talkative.  She is not answering questions appropriately.  Surgical incision is healing well.  No surrounding erythema or drainage.  She tolerates gentle range of motion of the right hip.  I am able to straighten her leg without discomfort.  Toes warm well-perfused.  She responds to light touch in the right foot.  IMAGING: I personally ordered and reviewed the following images:  XR of the Right hip and AP pelvis demonstrates a hip hemiarthroplasty in good position.  The hip remains reduced.  There is no evidence of implant subsidence.  No acute fractures are noted.  Impression: Right hip hemiarthroplasty in stable position, without evidence of migration or subsidence compared to prior XR   Oliver Barre, MD 06/29/2023 10:17 AM

## 2023-07-05 ENCOUNTER — Other Ambulatory Visit: Payer: Self-pay

## 2023-07-05 ENCOUNTER — Encounter (HOSPITAL_COMMUNITY): Payer: Self-pay | Admitting: Emergency Medicine

## 2023-07-05 ENCOUNTER — Emergency Department (HOSPITAL_COMMUNITY)

## 2023-07-05 ENCOUNTER — Inpatient Hospital Stay (HOSPITAL_COMMUNITY)
Admission: EM | Admit: 2023-07-05 | Discharge: 2023-07-10 | DRG: 682 | Disposition: A | Discharge status: Skilled Nursing Facility | Source: Skilled Nursing Facility | Attending: Family Medicine | Admitting: Family Medicine

## 2023-07-05 DIAGNOSIS — E039 Hypothyroidism, unspecified: Secondary | ICD-10-CM | POA: Diagnosis present

## 2023-07-05 DIAGNOSIS — L89610 Pressure ulcer of right heel, unstageable: Secondary | ICD-10-CM | POA: Diagnosis not present

## 2023-07-05 DIAGNOSIS — L89611 Pressure ulcer of right heel, stage 1: Secondary | ICD-10-CM | POA: Diagnosis present

## 2023-07-05 DIAGNOSIS — E876 Hypokalemia: Secondary | ICD-10-CM | POA: Diagnosis present

## 2023-07-05 DIAGNOSIS — E119 Type 2 diabetes mellitus without complications: Secondary | ICD-10-CM | POA: Diagnosis present

## 2023-07-05 DIAGNOSIS — E870 Hyperosmolality and hypernatremia: Secondary | ICD-10-CM | POA: Diagnosis present

## 2023-07-05 DIAGNOSIS — R338 Other retention of urine: Secondary | ICD-10-CM | POA: Diagnosis not present

## 2023-07-05 DIAGNOSIS — I82413 Acute embolism and thrombosis of femoral vein, bilateral: Secondary | ICD-10-CM | POA: Diagnosis not present

## 2023-07-05 DIAGNOSIS — N179 Acute kidney failure, unspecified: Secondary | ICD-10-CM | POA: Diagnosis present

## 2023-07-05 DIAGNOSIS — F02818 Dementia in other diseases classified elsewhere, unspecified severity, with other behavioral disturbance: Secondary | ICD-10-CM | POA: Diagnosis present

## 2023-07-05 DIAGNOSIS — Z66 Do not resuscitate: Secondary | ICD-10-CM | POA: Diagnosis present

## 2023-07-05 DIAGNOSIS — J449 Chronic obstructive pulmonary disease, unspecified: Secondary | ICD-10-CM | POA: Diagnosis present

## 2023-07-05 DIAGNOSIS — G309 Alzheimer's disease, unspecified: Secondary | ICD-10-CM | POA: Diagnosis present

## 2023-07-05 DIAGNOSIS — R339 Retention of urine, unspecified: Secondary | ICD-10-CM | POA: Diagnosis not present

## 2023-07-05 DIAGNOSIS — Z87891 Personal history of nicotine dependence: Secondary | ICD-10-CM | POA: Diagnosis not present

## 2023-07-05 DIAGNOSIS — I82461 Acute embolism and thrombosis of right calf muscular vein: Secondary | ICD-10-CM | POA: Diagnosis not present

## 2023-07-05 DIAGNOSIS — N1831 Chronic kidney disease, stage 3a: Secondary | ICD-10-CM | POA: Diagnosis present

## 2023-07-05 DIAGNOSIS — E860 Dehydration: Secondary | ICD-10-CM | POA: Diagnosis present

## 2023-07-05 DIAGNOSIS — Z515 Encounter for palliative care: Secondary | ICD-10-CM | POA: Diagnosis not present

## 2023-07-05 DIAGNOSIS — D631 Anemia in chronic kidney disease: Secondary | ICD-10-CM | POA: Diagnosis present

## 2023-07-05 DIAGNOSIS — E785 Hyperlipidemia, unspecified: Secondary | ICD-10-CM | POA: Diagnosis present

## 2023-07-05 DIAGNOSIS — N390 Urinary tract infection, site not specified: Secondary | ICD-10-CM

## 2023-07-05 DIAGNOSIS — Z90710 Acquired absence of both cervix and uterus: Secondary | ICD-10-CM

## 2023-07-05 DIAGNOSIS — Z79899 Other long term (current) drug therapy: Secondary | ICD-10-CM

## 2023-07-05 DIAGNOSIS — I82431 Acute embolism and thrombosis of right popliteal vein: Secondary | ICD-10-CM | POA: Diagnosis not present

## 2023-07-05 DIAGNOSIS — F03918 Unspecified dementia, unspecified severity, with other behavioral disturbance: Secondary | ICD-10-CM | POA: Diagnosis present

## 2023-07-05 DIAGNOSIS — G9341 Metabolic encephalopathy: Principal | ICD-10-CM | POA: Diagnosis present

## 2023-07-05 DIAGNOSIS — Z7982 Long term (current) use of aspirin: Secondary | ICD-10-CM

## 2023-07-05 DIAGNOSIS — R627 Adult failure to thrive: Secondary | ICD-10-CM | POA: Diagnosis present

## 2023-07-05 DIAGNOSIS — X58XXXD Exposure to other specified factors, subsequent encounter: Secondary | ICD-10-CM | POA: Diagnosis present

## 2023-07-05 DIAGNOSIS — F0390 Unspecified dementia without behavioral disturbance: Secondary | ICD-10-CM | POA: Diagnosis not present

## 2023-07-05 DIAGNOSIS — D649 Anemia, unspecified: Secondary | ICD-10-CM | POA: Insufficient documentation

## 2023-07-05 DIAGNOSIS — I10 Essential (primary) hypertension: Secondary | ICD-10-CM | POA: Diagnosis not present

## 2023-07-05 DIAGNOSIS — L8990 Pressure ulcer of unspecified site, unspecified stage: Secondary | ICD-10-CM | POA: Diagnosis present

## 2023-07-05 DIAGNOSIS — I82403 Acute embolism and thrombosis of unspecified deep veins of lower extremity, bilateral: Secondary | ICD-10-CM

## 2023-07-05 DIAGNOSIS — Z96641 Presence of right artificial hip joint: Secondary | ICD-10-CM | POA: Diagnosis present

## 2023-07-05 DIAGNOSIS — S72001D Fracture of unspecified part of neck of right femur, subsequent encounter for closed fracture with routine healing: Secondary | ICD-10-CM

## 2023-07-05 DIAGNOSIS — I129 Hypertensive chronic kidney disease with stage 1 through stage 4 chronic kidney disease, or unspecified chronic kidney disease: Secondary | ICD-10-CM | POA: Diagnosis present

## 2023-07-05 DIAGNOSIS — F0284 Dementia in other diseases classified elsewhere, unspecified severity, with anxiety: Secondary | ICD-10-CM | POA: Diagnosis present

## 2023-07-05 DIAGNOSIS — Z79890 Hormone replacement therapy (postmenopausal): Secondary | ICD-10-CM

## 2023-07-05 DIAGNOSIS — Z7189 Other specified counseling: Secondary | ICD-10-CM | POA: Diagnosis not present

## 2023-07-05 DIAGNOSIS — K219 Gastro-esophageal reflux disease without esophagitis: Secondary | ICD-10-CM | POA: Diagnosis present

## 2023-07-05 DIAGNOSIS — R4182 Altered mental status, unspecified: Secondary | ICD-10-CM | POA: Diagnosis not present

## 2023-07-05 DIAGNOSIS — R569 Unspecified convulsions: Secondary | ICD-10-CM | POA: Diagnosis not present

## 2023-07-05 LAB — COMPREHENSIVE METABOLIC PANEL WITH GFR
ALT: 11 U/L (ref 0–44)
AST: 16 U/L (ref 15–41)
Albumin: 3.1 g/dL — ABNORMAL LOW (ref 3.5–5.0)
Alkaline Phosphatase: 88 U/L (ref 38–126)
Anion gap: 8 (ref 5–15)
BUN: 32 mg/dL — ABNORMAL HIGH (ref 8–23)
CO2: 23 mmol/L (ref 22–32)
Calcium: 9 mg/dL (ref 8.9–10.3)
Chloride: 119 mmol/L — ABNORMAL HIGH (ref 98–111)
Creatinine, Ser: 1.47 mg/dL — ABNORMAL HIGH (ref 0.44–1.00)
GFR, Estimated: 37 mL/min — ABNORMAL LOW (ref >60)
Glucose, Bld: 108 mg/dL — ABNORMAL HIGH (ref 70–99)
Potassium: 3.7 mmol/L (ref 3.5–5.1)
Sodium: 150 mmol/L — ABNORMAL HIGH (ref 135–145)
Total Bilirubin: 0.8 mg/dL (ref 0.0–1.2)
Total Protein: 6.3 g/dL — ABNORMAL LOW (ref 6.5–8.1)

## 2023-07-05 LAB — CBC WITH DIFFERENTIAL/PLATELET
Abs Immature Granulocytes: 0.04 10*3/uL (ref 0.00–0.07)
Basophils Absolute: 0.1 10*3/uL (ref 0.0–0.1)
Basophils Relative: 1 %
Eosinophils Absolute: 0.3 10*3/uL (ref 0.0–0.5)
Eosinophils Relative: 4 %
HCT: 33.2 % — ABNORMAL LOW (ref 36.0–46.0)
Hemoglobin: 9.5 g/dL — ABNORMAL LOW (ref 12.0–15.0)
Immature Granulocytes: 1 %
Lymphocytes Relative: 17 %
Lymphs Abs: 1.3 10*3/uL (ref 0.7–4.0)
MCH: 26.3 pg (ref 26.0–34.0)
MCHC: 28.6 g/dL — ABNORMAL LOW (ref 30.0–36.0)
MCV: 92 fL (ref 80.0–100.0)
Monocytes Absolute: 0.4 10*3/uL (ref 0.1–1.0)
Monocytes Relative: 6 %
Neutro Abs: 5.5 10*3/uL (ref 1.7–7.7)
Neutrophils Relative %: 71 %
Platelets: 201 10*3/uL (ref 150–400)
RBC: 3.61 MIL/uL — ABNORMAL LOW (ref 3.87–5.11)
RDW: 16.3 % — ABNORMAL HIGH (ref 11.5–15.5)
WBC: 7.5 10*3/uL (ref 4.0–10.5)
nRBC: 0 % (ref 0.0–0.2)

## 2023-07-05 LAB — BLOOD GAS, VENOUS
Acid-base deficit: 1.8 mmol/L (ref 0.0–2.0)
Bicarbonate: 24.3 mmol/L (ref 20.0–28.0)
Drawn by: 7012
O2 Saturation: 47.4 %
Patient temperature: 36.8
pCO2, Ven: 46 mmHg (ref 44–60)
pH, Ven: 7.33 (ref 7.25–7.43)
pO2, Ven: <31 mmHg — CL (ref 32–45)

## 2023-07-05 LAB — BASIC METABOLIC PANEL WITH GFR
Anion gap: 12 (ref 5–15)
BUN: 30 mg/dL — ABNORMAL HIGH (ref 8–23)
CO2: 16 mmol/L — ABNORMAL LOW (ref 22–32)
Calcium: 8.8 mg/dL — ABNORMAL LOW (ref 8.9–10.3)
Chloride: 123 mmol/L — ABNORMAL HIGH (ref 98–111)
Creatinine, Ser: 1.54 mg/dL — ABNORMAL HIGH (ref 0.44–1.00)
GFR, Estimated: 35 mL/min — ABNORMAL LOW (ref >60)
Glucose, Bld: 99 mg/dL (ref 70–99)
Potassium: 3.8 mmol/L (ref 3.5–5.1)
Sodium: 151 mmol/L — ABNORMAL HIGH (ref 135–145)

## 2023-07-05 LAB — CBG MONITORING, ED: Glucose-Capillary: 98 mg/dL (ref 70–99)

## 2023-07-05 LAB — AMMONIA: Ammonia: 10 umol/L (ref 9–35)

## 2023-07-05 LAB — MAGNESIUM: Magnesium: 2.2 mg/dL (ref 1.7–2.4)

## 2023-07-05 LAB — GLUCOSE, CAPILLARY: Glucose-Capillary: 92 mg/dL (ref 70–99)

## 2023-07-05 MED ORDER — ONDANSETRON HCL 4 MG PO TABS: 4.0000 mg | ORAL_TABLET | Freq: Four times a day (QID) | ORAL | Status: DC | PRN

## 2023-07-05 MED ORDER — ONDANSETRON HCL 4 MG/2ML IJ SOLN
4.0000 mg | Freq: Four times a day (QID) | INTRAMUSCULAR | Status: DC | PRN
Start: 2023-07-05 — End: 2023-07-10

## 2023-07-05 MED ORDER — SODIUM CHLORIDE 0.9 % IV BOLUS
1000.0000 mL | Freq: Once | INTRAVENOUS | Status: AC
Start: 2023-07-05 — End: 2023-07-06
  Administered 2023-07-05: 1000 mL via INTRAVENOUS

## 2023-07-05 MED ORDER — INSULIN ASPART 100 UNIT/ML IJ SOLN
0.0000 [IU] | Freq: Four times a day (QID) | INTRAMUSCULAR | Status: DC
  Administered 2023-07-06 (×2): 1 [IU] via SUBCUTANEOUS

## 2023-07-05 MED ORDER — ACETAMINOPHEN 325 MG PO TABS: 650.0000 mg | ORAL_TABLET | Freq: Four times a day (QID) | ORAL | Status: DC | PRN

## 2023-07-05 MED ORDER — HEPARIN SODIUM (PORCINE) 5000 UNIT/ML IJ SOLN
5000.0000 [IU] | Freq: Three times a day (TID) | INTRAMUSCULAR | Status: DC
  Administered 2023-07-05 – 2023-07-09 (×12): 5000 [IU] via SUBCUTANEOUS
  Filled 2023-07-05 (×12): qty 1

## 2023-07-05 MED ORDER — ACETAMINOPHEN 650 MG RE SUPP: 650.0000 mg | Freq: Four times a day (QID) | RECTAL | Status: DC | PRN

## 2023-07-05 MED ORDER — POLYETHYLENE GLYCOL 3350 17 G PO PACK: 17.0000 g | PACK | Freq: Every day | ORAL | Status: DC | PRN

## 2023-07-05 MED ORDER — HYDRALAZINE HCL 20 MG/ML IJ SOLN: 5.0000 mg | INTRAMUSCULAR | Status: DC | PRN

## 2023-07-05 MED ORDER — DEXTROSE 5 % IV SOLN: INTRAVENOUS | Status: AC

## 2023-07-05 NOTE — ED Triage Notes (Signed)
 Pt bib [facility] from [facility] for hypernatremia. Pt has dementia at baseline but has been more altered x2 days.

## 2023-07-05 NOTE — Assessment & Plan Note (Addendum)
 BP controlled - Hold lisinopril

## 2023-07-05 NOTE — Assessment & Plan Note (Addendum)
TSH normal - Continue LT4

## 2023-07-05 NOTE — Assessment & Plan Note (Signed)
 Glucose control - Check hemoglobin A1c - No need at present for sliding scale corrections

## 2023-07-05 NOTE — H&P (Signed)
 History and Physical    Paula Andrews:[id number] DOB: September 20, 1945 DOA: 07/05/2023  PCP: System, Provider Not In   Patient coming from: [facility]  I have personally briefly reviewed patient's old medical records in [facility]  Chief Complaint: AMS  HPI: Paula Andrews is a 78 y.o. female with medical history significant for advanced Alzheimer's dementia, COPD, diabetes mellitus, hypertension. Patient was brought to the ED reports of altered mental status over the past 2 days.  Patient has advanced dementia, does not recognize family at baseline, per family reports increased sleepiness, difficult to arouse.  Family believes the Ativan she has been getting has contributed to the mentation. On my evaluation, patient is somnolent, not able to provide history.  Patient's daughter Reita Cliche and granddaughter Hospital doctor at bedside and assists with the history.  She has had poor oral intake with limited fluid intake over the past few days.  Recent hospitalization 3/13 to 3/18 for right hip fracture, underwent ORIF 3/14 by Dr. Dallas Schimke.  ED Course: Temperature 98.  Heart rate 60s to 70s.  Respiratory rate 15-21.  Blood pressure systolic 120s to 956O.  O2 sats greater than 92% on room air.  Sodium 150.  Creatinine 1.47. VBG shows pH of 7.3, pCO2 of 46. CT no acute abnormality, shows old left occipital stroke. 1 L bolus given.  Review of Systems: Unable to assess due to altered mental status..  Past Medical History:  Diagnosis Date   Acid reflux    Agitation due to dementia (HCC)    Anxiety    Arthritis    Dementia (HCC)    Depression    Diabetes mellitus    Hypertension    Psychosis (HCC)    Seasonal allergies    Thyroid disease     Past Surgical History:  Procedure Laterality Date   ABDOMINAL HYSTERECTOMY     TOTAL HIP ARTHROPLASTY Right 06/15/2023   Procedure: ARTHROPLASTY, HIP, TOTAL,POSTERIOR APPROACH;  Surgeon: Oliver Barre, MD;  Location: [facility];  Service: Orthopedics;   Laterality: Right;     reports that she quit smoking about 22 years ago. Her smoking use included cigarettes. She started smoking about 63 years ago. She has a 41 pack-year smoking history. She has never used smokeless tobacco. She reports that she does not drink alcohol and does not use drugs.  Allergies  Allergen Reactions   Lorazepam     Other Reaction(s): hallucinations    Family History  Problem Relation Age of Onset   Diabetes Other     Prior to Admission medications   Medication Sig Start Date End Date Taking? Authorizing Provider  acetaminophen (TYLENOL) 325 MG tablet Take 2 tablets (650 mg total) by mouth 2 (two) times daily as needed for mild pain or moderate pain. 11/28/19   Blane Ohara, MD  alum & mag hydroxide-simeth (MI-ACID) 200-200-20 MG/5ML suspension Take 30 mLs by mouth every 6 (six) hours as needed for indigestion or heartburn.    [provider]  aspirin EC 81 MG tablet Take 1 tablet (81 mg total) by mouth 2 (two) times daily. Swallow whole. 06/19/23 07/24/23  Johnson, Clanford L, MD  escitalopram (LEXAPRO) 10 MG tablet Take 10 mg by mouth daily. 05/14/23   [provider]  feeding supplement (ENSURE ENLIVE / ENSURE PLUS) LIQD Take 237 mLs by mouth 2 (two) times daily between meals. 06/19/23   Johnson, Clanford L, MD  levothyroxine (SYNTHROID) 75 MCG tablet Take 75 mcg by mouth daily. 01/09/23  [provider]  lisinopril (ZESTRIL) 5 MG tablet Take 1 tablet (5 mg total) by mouth every morning. 06/19/23   Johnson, Clanford L, MD  loperamide (IMODIUM) 2 MG capsule Take 2 mg by mouth as needed for diarrhea or loose stools (max 8 doses in 24 hours).    [provider]  LORazepam (ATIVAN) 0.5 MG tablet Take 1 tablet (0.5 mg total) by mouth every 8 (eight) hours as needed for anxiety, seizure, sleep or sedation. 06/19/23   Johnson, Clanford L, MD  melatonin 1 MG TABS tablet Take 1 mg by mouth at bedtime.    [provider]  memantine  (NAMENDA XR) 21 MG CP24 24 hr capsule Take 1 capsule (21 mg total) by mouth every morning. 06/19/23   Johnson, Clanford L, MD  OLANZapine (ZYPREXA) 10 MG tablet Take 10 mg by mouth at bedtime. 05/14/23   [provider]  omeprazole (PRILOSEC OTC) 20 MG tablet Take 1 tablet (20 mg total) by mouth every morning. 06/19/23 06/18/24  Johnson, Clanford L, MD  ondansetron (ZOFRAN) 4 MG tablet Take 4 mg by mouth every 4 (four) hours as needed. 05/17/23   [provider]  Vitamin D, Cholecalciferol, 10 MCG (400 UNIT) TABS Take 800 Units by mouth every morning.    [provider]    Physical Exam: Vitals:   07/05/23 1502 07/05/23 1545 07/05/23 1605 07/05/23 1608  BP: 125/68     Pulse: 63 66 69   Resp: (!) 21 15 15    Temp:    (!) 97.5 F (36.4 C)  TempSrc:    Axillary  SpO2: 94% 94% 94%   Weight:      Height:        Constitutional: Somnolent, mouth open Vitals:   07/05/23 1502 07/05/23 1545 07/05/23 1605 07/05/23 1608  BP: 125/68     Pulse: 63 66 69   Resp: (!) 21 15 15    Temp:    (!) 97.5 F (36.4 C)  TempSrc:    Axillary  SpO2: 94% 94% 94%   Weight:      Height:       Eyes: PERRL, lids and conjunctivae normal ENMT: Mucous membranes are markedly dry Neck: normal, supple, no masses, no thyromegaly Respiratory: clear to auscultation bilaterally, no wheezing, no crackles. Normal respiratory effort. No accessory muscle use.  Cardiovascular: Regular rate and rhythm, no murmurs / rubs / gallops. No extremity edema.  Extremities warm. Abdomen: no tenderness, no masses palpated. No hepatosplenomegaly.  Musculoskeletal: no clubbing / cyanosis. No joint deformity upper and lower extremities.  Skin: Pressure ulcer to right heel, appears to be a bullae, with dark discoloration, likely from hemorrhage into bullae sac, no rashes, lesions, ulcers. Neurologic: Limited exam due to altered mental status.  No facial asymmetry,.  Psychiatric: Limited exam due to altered mental  status.  Somnolent.    Labs on Admission: I have personally reviewed following labs and imaging studies  CBC: Recent Labs  Lab 07/05/23 1501  WBC 7.5  NEUTROABS 5.5  HGB 9.5*  HCT 33.2*  MCV 92.0  PLT 201   Basic Metabolic Panel: Recent Labs  Lab 07/05/23 1501  NA 150*  K 3.7  CL 119*  CO2 23  GLUCOSE 108*  BUN 32*  CREATININE 1.47*  CALCIUM 9.0  MG 2.2   GFR: Estimated Creatinine Clearance: 26.3 mL/min (A) (by C-G formula based on SCr of 1.47 mg/dL (H)). Liver Function Tests: Recent Labs  Lab 07/05/23 1501  AST 16  ALT 11  ALKPHOS 88  BILITOT 0.8  PROT 6.3*  ALBUMIN 3.1*   No results for input(s): "LIPASE", "AMYLASE" in the last 168 hours. Recent Labs  Lab 07/05/23 1501  AMMONIA 10   CBG: Recent Labs  Lab 07/05/23 1152  GLUCAP 98   Radiological Exams on Admission: CT Head Wo Contrast Result Date: 07/05/2023 CLINICAL DATA:  Mental status change of unknown cause. Dementia. Hyponatremia. EXAM: CT HEAD WITHOUT CONTRAST TECHNIQUE: Contiguous axial images were obtained from the base of the skull through the vertex without intravenous contrast. RADIATION DOSE REDUCTION: This exam was performed according to the departmental dose-optimization program which includes automated exposure control, adjustment of the mA and/or kV according to patient size and/or use of iterative reconstruction technique. COMPARISON:  06/14/2023 FINDINGS: Brain: Generalized atrophy. No focal abnormality seen affecting the brainstem or cerebellum. Old left occipital stroke. Advanced chronic small-vessel ischemic changes throughout the hemispheric white matter. No sign of acute stroke, mass, hemorrhage, hydrocephalus or extra-axial collection. Vascular: There is atherosclerotic calcification of the major vessels at the base of the brain. Skull: Negative Sinuses/Orbits: Clear/normal Other: None IMPRESSION: No acute CT finding. Generalized atrophy. Old left occipital stroke. Advanced chronic  small-vessel ischemic changes of the hemispheric white matter. Electronically Signed   By: Paulina Fusi M.D.   On: 07/05/2023 15:18   DG Chest Port 1 View Result Date: 07/05/2023 CLINICAL DATA:  Altered mental status. EXAM: PORTABLE CHEST 1 VIEW COMPARISON:  Chest radiograph dated 06/14/2023. FINDINGS: No focal consolidation, pleural effusion or pneumothorax. The cardiac silhouette is within limits. Atherosclerotic calcification of the aorta. No acute osseous pathology. IMPRESSION: No active disease. Electronically Signed   By: Elgie Collard M.D.   On: 07/05/2023 12:52    EKG: Independently reviewed.  Sinus rhythm, rate 63, QTc 441.  No significant change from prior.  Assessment/Plan Principal Problem:   Acute metabolic encephalopathy Active Problems:   Hypernatremia   Pressure injury of skin   Type 2 diabetes mellitus without complications (HCC)   Hypothyroidism   HTN (hypertension)   Dementia with behavioral disturbance (HCC)   AKI (acute kidney injury) (HCC)  Assessment and Plan: * Acute metabolic encephalopathy Of 2 days duration.  Likely 2/2 hypernatremia and marked dehydration.  Head CT negative for acute abnormality.  Patient's somnolent on my evaluation.  Chest x-ray-no acute abnormality.  Afebrile without leukocytosis.  VBG with pH of 7.3, pCO2 of 46. -Hydrate -Follow-up UA -Remain n.p.o. pending improvement in mental status -Hold Ativan, memantine, Zyprexa, Lexapro  Hypernatremia Likely chronic.  Sodium 150.  Poor oral intake over the past several days.  Resume home resident, with advanced dementia.  Fluid deficit calculated at 1.7 L. -1 L normal saline bolus given, continue D5 water at 100 cc/h X 15hr. - Goal- 10 to 12 mEq increase in sodium over 24 hours -Trend sodium closely -Monitor blood glucose  Pressure injury of skin To right heel. -Wound care consult  Type 2 diabetes mellitus without complications (HCC) - SSI- S while on D5 fluids -Not on home  medications - HgbA1c  Hypothyroidism Hold Synthroid while n.p.o.  AKI (acute kidney injury) (HCC) Creatinine 1.4, baseline  ~ 0.8. -Likely secondary to dehydration, hydrate.  Dementia with behavioral disturbance [facility]) Nursing home resident.  Does not recognize family at baseline.  Family concerned that Patient is getting at the nursing home is suboptimal.   HTN (hypertension) Stable. -Hold lisinopril while n.p.o. -IV hydralazine 5 mg for systolic > 180   DVT prophylaxis: Heparin Code Status:  DNR-MOST form at bedside stating DNR.  Patient has universal DNR form -family will bring it.  Confirmed DNR status with daughter Reita Cliche and granddaughter Hospital doctor at bedside. Family Communication: Daughter will be and Optometrist at bedside.  Patient's husband is power of attorney. Disposition Plan: ~ 2 days Consults called: None Admission status: Inpt tele I certify that at the point of admission it is my clinical judgment that the patient will require inpatient hospital care spanning beyond 2 midnights from the point of admission due to high intensity of service, high risk for further deterioration and high frequency of surveillance required.   Author: Onnie Boer, MD 07/05/2023 7:40 PM  For on call review [email or web address].

## 2023-07-05 NOTE — Assessment & Plan Note (Signed)
 To right heel. -Wound care consult

## 2023-07-05 NOTE — Assessment & Plan Note (Addendum)
 Sodium 150 on admission.  Resolved today with hypotonic fluids - Transition to isotonic fluid

## 2023-07-05 NOTE — Assessment & Plan Note (Addendum)
 Of 2 days duration.  Likely 2/2 hypernatremia and marked dehydration.  Head CT negative for acute abnormality.  Patient's somnolent on my evaluation.  Chest x-ray-no acute abnormality.  Afebrile without leukocytosis.  VBG with pH of 7.3, pCO2 of 46. -Hydrate -Follow-up UA -Remain n.p.o. pending improvement in mental status -Hold Ativan, memantine, Zyprexa, Lexapro

## 2023-07-05 NOTE — Assessment & Plan Note (Addendum)
 Creatinine 1.4, baseline  ~ 0.8. Cr slightly down to 1.2 - Hold ACEi - Continue IV fluids

## 2023-07-05 NOTE — Assessment & Plan Note (Addendum)
-   Resume memantine and Lexapro

## 2023-07-05 NOTE — ED Notes (Signed)
 Patient transported to CT

## 2023-07-05 NOTE — ED Notes (Signed)
 Attempted IV to rt FA without success.

## 2023-07-05 NOTE — ED Provider Notes (Signed)
 [place] EMERGENCY DEPARTMENT AT [facility] Provider Note   CSN: [id number] Arrival date & time: 07/05/23  1101     History  Chief Complaint  Patient presents with   Altered Mental Status    Paula Andrews is a 78 y.o. female.  Level 5 caveat secondary to dementia and altered mental status.  Patient sent in by ambulance from her facility at [facility] for altered mental status and elevated sodium.  Patient unable to give any history.  The history is provided by the EMS personnel.  Altered Mental Status Presenting symptoms: lethargy   Context: nursing home resident        Home Medications Prior to Admission medications   Medication Sig Start Date End Date Taking? Authorizing Provider  acetaminophen (TYLENOL) 325 MG tablet Take 2 tablets (650 mg total) by mouth 2 (two) times daily as needed for mild pain or moderate pain. 11/28/19   Blane Ohara, MD  alum & mag hydroxide-simeth (MI-ACID) 200-200-20 MG/5ML suspension Take 30 mLs by mouth every 6 (six) hours as needed for indigestion or heartburn.    [provider]  aspirin EC 81 MG tablet Take 1 tablet (81 mg total) by mouth 2 (two) times daily. Swallow whole. 06/19/23 07/24/23  Johnson, Clanford L, MD  escitalopram (LEXAPRO) 10 MG tablet Take 10 mg by mouth daily. 05/14/23   [provider]  feeding supplement (ENSURE ENLIVE / ENSURE PLUS) LIQD Take 237 mLs by mouth 2 (two) times daily between meals. 06/19/23   Johnson, Clanford L, MD  levothyroxine (SYNTHROID) 75 MCG tablet Take 75 mcg by mouth daily. 01/09/23   [provider]  lisinopril (ZESTRIL) 5 MG tablet Take 1 tablet (5 mg total) by mouth every morning. 06/19/23   Johnson, Clanford L, MD  loperamide (IMODIUM) 2 MG capsule Take 2 mg by mouth as needed for diarrhea or loose stools (max 8 doses in 24 hours).    [provider]  LORazepam (ATIVAN) 0.5 MG tablet Take 1 tablet (0.5 mg total) by mouth every 8 (eight) hours as needed  for anxiety, seizure, sleep or sedation. 06/19/23   Johnson, Clanford L, MD  melatonin 1 MG TABS tablet Take 1 mg by mouth at bedtime.    [provider]  memantine (NAMENDA XR) 21 MG CP24 24 hr capsule Take 1 capsule (21 mg total) by mouth every morning. 06/19/23   Johnson, Clanford L, MD  OLANZapine (ZYPREXA) 10 MG tablet Take 10 mg by mouth at bedtime. 05/14/23   [provider]  omeprazole (PRILOSEC OTC) 20 MG tablet Take 1 tablet (20 mg total) by mouth every morning. 06/19/23 06/18/24  Johnson, Clanford L, MD  ondansetron (ZOFRAN) 4 MG tablet Take 4 mg by mouth every 4 (four) hours as needed. 05/17/23   [provider]  Vitamin D, Cholecalciferol, 10 MCG (400 UNIT) TABS Take 800 Units by mouth every morning.    [provider]      Allergies    Lorazepam    Review of Systems   Review of Systems  Unable to perform ROS: Mental status change    Physical Exam Updated Vital Signs BP (!) 155/68 (BP Location: Left Arm)   Pulse 68   Temp 98 F (36.7 C) (Oral)   Resp 16   Ht 5\' 4"  (1.626 m)   Wt 52 kg   SpO2 94%   BMI 19.68 kg/m  Physical Exam Vitals and nursing note reviewed.  Constitutional:  General: She is not in acute distress.    Appearance: Normal appearance. She is well-developed.  HENT:     Head: Normocephalic and atraumatic.  Eyes:     Conjunctiva/sclera: Conjunctivae normal.  Cardiovascular:     Rate and Rhythm: Normal rate and regular rhythm.     Heart sounds: No murmur heard. Pulmonary:     Effort: Pulmonary effort is normal. No respiratory distress.     Breath sounds: Normal breath sounds.  Abdominal:     Palpations: Abdomen is soft.     Tenderness: There is no abdominal tenderness.  Musculoskeletal:        General: No deformity.     Cervical back: Neck supple.  Skin:    General: Skin is warm and dry.     Capillary Refill: Capillary refill takes less than 2 seconds.  Neurological:     General: No focal deficit present.      Comments: Patient will not open eyes or follow commands.  She will localize noxious stimuli with both her upper extremities and resists movement of both lower extremities, no focal weakness appreciated     ED Results / Procedures / Treatments   Labs (all labs ordered are listed, but only abnormal results are displayed) Labs Reviewed  CBC WITH DIFFERENTIAL/PLATELET - Abnormal; Notable for the following components:      Result Value   RBC 3.61 (*)    Hemoglobin 9.5 (*)    HCT 33.2 (*)    MCHC 28.6 (*)    RDW 16.3 (*)    All other components within normal limits  COMPREHENSIVE METABOLIC PANEL WITH GFR - Abnormal; Notable for the following components:   Sodium 150 (*)    Chloride 119 (*)    Glucose, Bld 108 (*)    BUN 32 (*)    Creatinine, Ser 1.47 (*)    Total Protein 6.3 (*)    Albumin 3.1 (*)    GFR, Estimated 37 (*)    All other components within normal limits  BLOOD GAS, VENOUS - Abnormal; Notable for the following components:   pO2, Ven <31 (*)    All other components within normal limits  AMMONIA  MAGNESIUM  URINALYSIS, ROUTINE W REFLEX MICROSCOPIC  CBG MONITORING, ED    EKG EKG Interpretation Date/Time:  Thursday July 05 2023 11:16:15 EDT Ventricular Rate:  63 PR Interval:    QRS Duration:  92 QT Interval:  430 QTC Calculation: 441 R Axis:   61  Text Interpretation: Normal sinus rhythm Nonspecific T abnormalities, anterior leads No significant change since prior 3/25 Confirmed by Meridee Score [phone]) on 07/05/2023 11:33:06 AM  Radiology CT Head Wo Contrast Result Date: 07/05/2023 CLINICAL DATA:  Mental status change of unknown cause. Dementia. Hyponatremia. EXAM: CT HEAD WITHOUT CONTRAST TECHNIQUE: Contiguous axial images were obtained from the base of the skull through the vertex without intravenous contrast. RADIATION DOSE REDUCTION: This exam was performed according to the departmental dose-optimization program which includes automated exposure control,  adjustment of the mA and/or kV according to patient size and/or use of iterative reconstruction technique. COMPARISON:  06/14/2023 FINDINGS: Brain: Generalized atrophy. No focal abnormality seen affecting the brainstem or cerebellum. Old left occipital stroke. Advanced chronic small-vessel ischemic changes throughout the hemispheric white matter. No sign of acute stroke, mass, hemorrhage, hydrocephalus or extra-axial collection. Vascular: There is atherosclerotic calcification of the major vessels at the base of the brain. Skull: Negative Sinuses/Orbits: Clear/normal Other: None IMPRESSION: No acute CT finding. Generalized atrophy. Old left  occipital stroke. Advanced chronic small-vessel ischemic changes of the hemispheric white matter. Electronically Signed   By: Paulina Fusi M.D.   On: 07/05/2023 15:18   DG Chest Port 1 View Result Date: 07/05/2023 CLINICAL DATA:  Altered mental status. EXAM: PORTABLE CHEST 1 VIEW COMPARISON:  Chest radiograph dated 06/14/2023. FINDINGS: No focal consolidation, pleural effusion or pneumothorax. The cardiac silhouette is within limits. Atherosclerotic calcification of the aorta. No acute osseous pathology. IMPRESSION: No active disease. Electronically Signed   By: Elgie Collard M.D.   On: 07/05/2023 12:52    Procedures Procedures    Medications Ordered in ED Medications  sodium chloride 0.9 % bolus 1,000 mL (1,000 mLs Intravenous New Bag/Given 07/05/23 1438)    ED Course/ Medical Decision Making/ A&P Clinical Course as of 07/05/23 1718  Thu Jul 05, 2023  1203 Patient's daughter is very concerned about her declining at [facility].  She feels she has been overmedicated. [MB]  1203 Chest x-ray interpreted by me as no acute infiltrate.  Awaiting radiology reading. [MB]  1537 Received sign out from Dr. Charm Barges, here with weakness, found to have dehydration and mild hypernatremia.  [WS]  1558 Signed out to hospitalist.  [WS]    Clinical Course User Index [MB]  Terrilee Files, MD [WS] Lonell Grandchild, MD                                 Medical Decision Making Amount and/or Complexity of Data Reviewed Labs: ordered. Radiology: ordered.  Risk Decision regarding hospitalization.   This patient complains of lethargy abnormal labs; this involves an extensive number of treatment Options and is a complaint that carries with it a high risk of complications and morbidity. The differential includes dehydration, metabolic derangement, sepsis, stroke  I ordered, reviewed and interpreted labs, which included CBC with low hemoglobin level, chemistries with elevated sodium elevated BUN and creatinine, VBG without acidosis, ammonia normal, urinalysis ordered not obtained I ordered medication IV fluids and reviewed PMP when indicated. I ordered imaging studies which included chest x-ray and head CT and I independently    visualized and interpreted imaging which showed no acute findings Additional history obtained from EMS and patient's family members Previous records obtained and reviewed in epic including recent discharge summary for right hip fracture Cardiac monitoring reviewed, sinus rhythm Social determinants considered, no significant barriers Critical Interventions: None  After the interventions stated above, I reevaluated the patient and found patient to be slightly more awake although not alert and following commands Admission and further testing considered, her care is signed out to Dr. Suezanne Jacquet to follow-up on results of imaging and labs.  Will need admission to the hospital for further management.         Final Clinical Impression(s) / ED Diagnoses Final diagnoses:  Acute metabolic encephalopathy  Hypernatremia  AKI (acute kidney injury) (HCC)    Rx / DC Orders ED Discharge Orders     None         Terrilee Files, MD 07/05/23 1721

## 2023-07-05 NOTE — ED Provider Notes (Signed)
  ED Course / MDM   Clinical Course as of 07/05/23 1558  Thu Jul 05, 2023  1203 Patient's daughter is very concerned about her declining at [facility].  She feels she has been overmedicated. [MB]  1203 Chest x-ray interpreted by me as no acute infiltrate.  Awaiting radiology reading. [MB]  1537 Received sign out from Dr. Charm Barges, here with weakness, found to have dehydration and mild hypernatremia.  [WS]  1558 Signed out to hospitalist.  [WS]    Clinical Course User Index [MB] Terrilee Files, MD [WS] Lonell Grandchild, MD   Medical Decision Making Amount and/or Complexity of Data Reviewed Labs: ordered. Radiology: ordered.          Lonell Grandchild, MD 07/05/23 [phone]

## 2023-07-06 DIAGNOSIS — G9341 Metabolic encephalopathy: Secondary | ICD-10-CM | POA: Diagnosis not present

## 2023-07-06 LAB — URINALYSIS, ROUTINE W REFLEX MICROSCOPIC
Bilirubin Urine: NEGATIVE
Glucose, UA: NEGATIVE mg/dL
Hgb urine dipstick: NEGATIVE
Ketones, ur: NEGATIVE mg/dL
Leukocytes,Ua: NEGATIVE
Nitrite: NEGATIVE
Protein, ur: NEGATIVE mg/dL
Specific Gravity, Urine: 1.018 (ref 1.005–1.030)
pH: 5 (ref 5.0–8.0)

## 2023-07-06 LAB — CBC
HCT: 31.4 % — ABNORMAL LOW (ref 36.0–46.0)
Hemoglobin: 8.8 g/dL — ABNORMAL LOW (ref 12.0–15.0)
MCH: 26.3 pg (ref 26.0–34.0)
MCHC: 28 g/dL — ABNORMAL LOW (ref 30.0–36.0)
MCV: 93.7 fL (ref 80.0–100.0)
Platelets: 206 10*3/uL (ref 150–400)
RBC: 3.35 MIL/uL — ABNORMAL LOW (ref 3.87–5.11)
RDW: 16.4 % — ABNORMAL HIGH (ref 11.5–15.5)
WBC: 7 10*3/uL (ref 4.0–10.5)
nRBC: 0 % (ref 0.0–0.2)

## 2023-07-06 LAB — BASIC METABOLIC PANEL WITH GFR
Anion gap: 5 (ref 5–15)
Anion gap: 7 (ref 5–15)
BUN: 28 mg/dL — ABNORMAL HIGH (ref 8–23)
BUN: 23 mg/dL (ref 8–23)
CO2: 24 mmol/L (ref 22–32)
CO2: 22 mmol/L (ref 22–32)
Calcium: 8.2 mg/dL — ABNORMAL LOW (ref 8.9–10.3)
Calcium: 8.2 mg/dL — ABNORMAL LOW (ref 8.9–10.3)
Chloride: 120 mmol/L — ABNORMAL HIGH (ref 98–111)
Chloride: 119 mmol/L — ABNORMAL HIGH (ref 98–111)
Creatinine, Ser: 1.51 mg/dL — ABNORMAL HIGH (ref 0.44–1.00)
Creatinine, Ser: 1.35 mg/dL — ABNORMAL HIGH (ref 0.44–1.00)
GFR, Estimated: 35 mL/min — ABNORMAL LOW (ref >60)
GFR, Estimated: 40 mL/min — ABNORMAL LOW (ref >60)
Glucose, Bld: 143 mg/dL — ABNORMAL HIGH (ref 70–99)
Glucose, Bld: 85 mg/dL (ref 70–99)
Potassium: 3.4 mmol/L — ABNORMAL LOW (ref 3.5–5.1)
Potassium: 4 mmol/L (ref 3.5–5.1)
Sodium: 149 mmol/L — ABNORMAL HIGH (ref 135–145)
Sodium: 148 mmol/L — ABNORMAL HIGH (ref 135–145)

## 2023-07-06 LAB — GLUCOSE, CAPILLARY
Glucose-Capillary: 130 mg/dL — ABNORMAL HIGH (ref 70–99)
Glucose-Capillary: 131 mg/dL — ABNORMAL HIGH (ref 70–99)
Glucose-Capillary: 133 mg/dL — ABNORMAL HIGH (ref 70–99)
Glucose-Capillary: 148 mg/dL — ABNORMAL HIGH (ref 70–99)

## 2023-07-06 MED ORDER — SODIUM CHLORIDE 0.9 % IV BOLUS
1000.0000 mL | Freq: Once | INTRAVENOUS | Status: AC
  Administered 2023-07-06: 1000 mL via INTRAVENOUS

## 2023-07-06 MED ORDER — POTASSIUM CHLORIDE 10 MEQ/100ML IV SOLN
10.0000 meq | INTRAVENOUS | Status: AC
  Administered 2023-07-06 (×2): 10 meq via INTRAVENOUS
  Filled 2023-07-06 (×2): qty 100

## 2023-07-06 MED ORDER — LEVOTHYROXINE SODIUM 75 MCG PO TABS
75.0000 ug | ORAL_TABLET | Freq: Every day | ORAL | Status: DC
Start: 2023-07-07 — End: 2023-07-10
  Administered 2023-07-07 – 2023-07-10 (×4): 75 ug via ORAL
  Filled 2023-07-06 (×4): qty 1

## 2023-07-06 MED ORDER — DEXTROSE 5 % IV SOLN: INTRAVENOUS | Status: DC

## 2023-07-06 MED ORDER — SODIUM CHLORIDE 0.9 % IV BOLUS
500.0000 mL | Freq: Once | INTRAVENOUS | Status: AC
  Administered 2023-07-06: 500 mL via INTRAVENOUS

## 2023-07-06 NOTE — Hospital Course (Addendum)
 78 year old female with a history of major neurocognitive disorder, diabetes mellitus type 2, hypertension hyperlipidemia, B12 deficiency, COPD, PE 03/2018, and hypothyroidism presenting with altered mental status from [facility].  Apparently, the patient had 2 days of somnolence, decreased oral intake.  According to the patient's daughter, the patient is able to speak albeit pleasantly confused.  History is unobtainable from the patient secondary to her major neurocognitive disorder.  Family are also difficult historians.  Family is concerned that the patient had been receiving hypnotic medications causing her somnolence.  Family states that the patient had been ambulating prior to her right hip fracture for which she underwent ORIF on 06/15/2023.  Since her right hip fracture, it appears that the patient has been essentially bedbound. In the ED, the patient was afebrile hemodynamically stable with oxygen saturation 95% room air.  WBC 7.0, hemoglobin 9.5, platelets 206.  Serum creatinine was 1.54 with serum sodium 149 on the day of admission.  CT of the brain was negative for acute findings.  She was started on IV fluids.

## 2023-07-06 NOTE — Progress Notes (Signed)
 AT bedside for PIV insertion. No suitable vessels identified for PIV, midline or PICC. Upper arm vessels bifurcate and too small to stick. Attempted PIV x 1 on LLFA with no success. Currently has 1 PIV in right foot that is functioning at this time.

## 2023-07-06 NOTE — Consult Note (Signed)
 WOC Nurse Consult Note: Reason for Consult: R heel wound  Wound type: Deep Tissue Pressure Injury  Pressure Injury POA: Yes Measurement: see nursing flowsheet  Wound bed: Pressure ulcer to right heel, appears to be a bullae, with dark discoloration Drainage (amount, consistency, odor) dry, skin intact  Periwound: Dressing procedure/placement/frequency: Cleanse R heel with soap and water, dry and apply Xeroform gauze [place] [phone]) to wound daily.  Cover with dry gauze and Kerlix roll gauze. Place R foot in Prevalon boot to offload pressure   POC discussed with bedside nurse. WOC team will not follow. Re-consult if further needs arise   Thank you,    Priscella Mann MSN, RN-BC, [employer] [phone]

## 2023-07-06 NOTE — Plan of Care (Signed)
   Problem: Education: Goal: Knowledge of General Education information will improve Description Including pain rating scale, medication(s)/side effects and non-pharmacologic comfort measures Outcome: Progressing

## 2023-07-06 NOTE — TOC Initial Note (Signed)
 Transition of Care [facility]) - Initial/Assessment Note    Patient Details  Name: Paula Andrews MRN: [id number] Date of Birth: 05/31/1945  [facility]) CM/SW Contact:    Villa Herb, LCSWA Phone Number: 07/06/2023, 10:40 AM  Clinical Narrative:                 CSW noted per chart review that pt arrived to hospital from [facility]. CSW spoke with pts spouse who states they prefer a new facility for pt if possible. CSW explained that SNF referral can be sent out and that PT eval will need to be completed. TOC to follow.   Expected Discharge Plan: Skilled Nursing Facility Barriers to Discharge: Continued Medical Work up   Patient Goals and CMS Choice Patient states their goals for this hospitalization and ongoing recovery are:: go to SNF CMS Medicare.gov Compare Post Acute Care list provided to:: Patient Represenative (must comment) Choice offered to / list presented to : Spouse [place] ownership interest in [facility].provided to:: Spouse    Expected Discharge Plan and Services In-house Referral: Clinical Social Work Discharge Planning Services: CM Consult Post Acute Care Choice: Skilled Nursing Facility Living arrangements for the past 2 months: Skilled Nursing Facility                                      Prior Living Arrangements/Services Living arrangements for the past 2 months: Skilled Nursing Facility Lives with:: Facility Resident Patient language and need for interpreter reviewed:: Yes Do you feel safe going back to the place where you live?: Yes      Need for Family Participation in Patient Care: Yes (Comment) Care giver support system in place?: Yes (comment)   Criminal Activity/Legal Involvement Pertinent to Current Situation/Hospitalization: No - Comment as needed  Activities of Daily Living      Permission Sought/Granted                  Emotional Assessment Appearance:: Appears stated  age Attitude/Demeanor/Rapport: Unable to Assess Affect (typically observed): Unable to Assess   Alcohol / Substance Use: Not Applicable Psych Involvement: No (comment)  Admission diagnosis:  Acute metabolic encephalopathy [G93.41] Patient Active Problem List   Diagnosis Date Noted   Acute metabolic encephalopathy 07/05/2023   Hypernatremia 07/05/2023   Pressure injury of skin 07/05/2023   AKI (acute kidney injury) (HCC) 07/05/2023   Alzheimer's disease with late onset (CODE) (HCC) 06/29/2023   Anxiety 06/29/2023   Hardening of the aorta (main artery of the heart) (HCC) 06/29/2023   Menopausal state 06/29/2023   Primary insomnia 06/29/2023   Closed right hip fracture, initial encounter (HCC) 06/14/2023   Hypokalemia 06/14/2023   Anxiety and depression 06/14/2023   Essential hypertension 06/14/2023   Type 2 diabetes mellitus without complications (HCC) 06/14/2023   Dementia with behavioral disturbance (HCC) 06/14/2023   Leucocytosis 09/18/2021   Hyperkalemia 09/18/2021   Acute hypoxemic respiratory failure (HCC) 09/18/2021   HCAP (healthcare-associated pneumonia)    Enterobacter sepsis (HCC) 03/24/2018   Sepsis due to undetermined organism (HCC) 03/23/2018   Lobar pneumonia (HCC) 03/23/2018   Bacteremia due to Gram-negative bacteria 03/23/2018   Gram negative sepsis (HCC) 03/23/2018   Pulmonary embolus (HCC)    Acute respiratory failure with hypoxia (HCC) 03/13/2018   Acute encephalopathy 03/13/2018   Fall 03/13/2018   Sinus bradycardia 03/13/2018   Dementia (HCC) 03/13/2018   Hypothyroidism  03/13/2018   Mood disorder (HCC) 03/13/2018   COPD with acute exacerbation (HCC) 08/07/2017   Adjustment 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 05/31/2017   Chronic respiratory failure with hypoxia and hypercapnia (HCC)    Acute respiratory failure with hypoxia and hypercapnia (HCC) 05/30/2017   Acute lower UTI 05/30/2017   HTN (hypertension) 05/30/2017   Diabetes  mellitus type II, controlled (HCC) 05/30/2017   HLD (hyperlipidemia) 05/30/2017   UTI (urinary tract infection) 05/30/2017   Hypoxia 05/29/2017   Hyponatremia 05/16/2017   Fracture of radial neck, left, closed 01/29/2012   Closed fracture of part of upper end of humerus 04/13/2010   PCP:  System, Provider Not In Pharmacy:   [facility] - [place], [place] - [address] [place] [address] Phone: [phone] Fax: [phone]     Social Drivers of Health (SDOH) Social History: SDOH Screenings   Food Insecurity: Patient Unable To Answer (06/14/2023)  Housing: Patient Unable To Answer (06/14/2023)  Transportation Needs: Patient Unable To Answer (06/14/2023)  Utilities: Patient Unable To Answer (06/14/2023)  Social Connections: Patient Unable To Answer (06/14/2023)  Tobacco Use: Medium Risk (07/05/2023)   SDOH Interventions:     Readmission Risk Interventions    07/06/2023   10:39 AM 06/18/2023   10:05 AM 09/20/2021    3:04 PM  Readmission Risk Prevention Plan  Transportation Screening Complete Complete Complete  PCP or Specialist Appt within 5-7 Days   Complete  Home Care Screening   Complete  Medication Review (RN CM)   Complete  HRI or Home Care Consult Complete Complete   Social Work Consult for Recovery Care Planning/Counseling Complete Complete   Palliative Care Screening Not Applicable --   Medication Review Ocean[employer]) Complete Complete

## 2023-07-06 NOTE — [place] FL2 (Signed)
 [place] MEDICAID FL2 LEVEL OF CARE FORM     IDENTIFICATION  Patient Name: Paula Andrews Birthdate: [birth year] Sex: female Admission Date (Current Location): 07/05/2023  [place] and [place] Number:  Aaron Edelman [id number] N Facility and Address:  [facility],  [address], [place] [address]      Provider Number: [id number]  Attending Physician Name and Address:  Alberteen Sam, *  Relative Name and Phone Number:  Sativa, Gelles (Spouse) [phone]    Current Level of Care: Hospital Recommended Level of Care: Skilled Nursing Facility Prior Approval Number:    Date Approved/Denied:   PASRR Number: [id number]  Discharge Plan: SNF    Current Diagnoses: Patient Active Problem List   Diagnosis Date Noted   Acute metabolic encephalopathy 07/05/2023   Hypernatremia 07/05/2023   Pressure injury of skin 07/05/2023   AKI (acute kidney injury) (HCC) 07/05/2023   Alzheimer's disease with late onset (CODE) (HCC) 06/29/2023   Anxiety 06/29/2023   Hardening of the aorta (main artery of the heart) (HCC) 06/29/2023   Menopausal state 06/29/2023   Primary insomnia 06/29/2023   Closed right hip fracture, initial encounter (HCC) 06/14/2023   Hypokalemia 06/14/2023   Anxiety and depression 06/14/2023   Essential hypertension 06/14/2023   Type 2 diabetes mellitus without complications (HCC) 06/14/2023   Dementia with behavioral disturbance (HCC) 06/14/2023   Leucocytosis 09/18/2021   Hyperkalemia 09/18/2021   Acute hypoxemic respiratory failure (HCC) 09/18/2021   HCAP (healthcare-associated pneumonia)    Enterobacter sepsis (HCC) 03/24/2018   Sepsis due to undetermined organism (HCC) 03/23/2018   Lobar pneumonia (HCC) 03/23/2018   Bacteremia due to Gram-negative bacteria 03/23/2018   Gram negative sepsis (HCC) 03/23/2018   Pulmonary embolus (HCC)    Acute respiratory failure with hypoxia (HCC) 03/13/2018   Acute encephalopathy 03/13/2018   Fall 03/13/2018    Sinus bradycardia 03/13/2018   Dementia (HCC) 03/13/2018   Hypothyroidism 03/13/2018   Mood disorder (HCC) 03/13/2018   COPD with acute exacerbation (HCC) 08/07/2017   Adjustment 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 05/31/2017   Chronic respiratory failure with hypoxia and hypercapnia (HCC)    Acute respiratory failure with hypoxia and hypercapnia (HCC) 05/30/2017   Acute lower UTI 05/30/2017   HTN (hypertension) 05/30/2017   Diabetes mellitus type II, controlled (HCC) 05/30/2017   HLD (hyperlipidemia) 05/30/2017   UTI (urinary tract infection) 05/30/2017   Hypoxia 05/29/2017   Hyponatremia 05/16/2017   Fracture of radial neck, left, closed 01/29/2012   Closed fracture of part of upper end of humerus 04/13/2010    Orientation RESPIRATION BLADDER Height & Weight     Self  Normal Incontinent Weight: 121 lb 11.1 oz (55.2 kg) Height:  5\' 2"  (157.5 cm)  BEHAVIORAL SYMPTOMS/MOOD NEUROLOGICAL BOWEL NUTRITION STATUS      Incontinent Diet (See D/C summary)  AMBULATORY STATUS COMMUNICATION OF NEEDS Skin   Extensive Assist Non-Verbally Other (Comment) (right heel deep tissue wound and buttocks right stage 2)                       Personal Care Assistance Level of Assistance  Bathing, Feeding, Dressing Bathing Assistance: Maximum assistance Feeding assistance: Maximum assistance Dressing Assistance: Maximum assistance     Functional Limitations Info  Sight, Hearing, Speech Sight Info: Adequate Hearing Info: Adequate Speech Info: Impaired    SPECIAL CARE FACTORS FREQUENCY  PT (By licensed PT), OT (By licensed OT)     PT Frequency: 5 times weekly OT Frequency: 5 times  weekly            Contractures Contractures Info: Not present    Additional Factors Info  Code Status, Allergies, Psychotropic Code Status Info: DNR- limited Allergies Info: Lorazepam Psychotropic Info: Buspar and effexor         Current Medications (07/06/2023):  This is the  current hospital active medication list Current Facility-Administered Medications  Medication Dose Route Frequency Provider Last Rate Last Admin   acetaminophen (TYLENOL) tablet 650 mg  650 mg Oral Q6H PRN Emokpae, Ejiroghene E, MD       Or   acetaminophen (TYLENOL) suppository 650 mg  650 mg Rectal Q6H PRN Emokpae, Ejiroghene E, MD       dextrose 5 % solution   Intravenous Continuous Alberteen Sam, MD 125 mL/hr at 07/06/23 0626 New Bag at 07/06/23 0626   heparin injection 5,000 Units  5,000 Units Subcutaneous Q8H Emokpae, Ejiroghene E, MD   5,000 Units at 07/06/23 7846   hydrALAZINE (APRESOLINE) injection 5 mg  5 mg Intravenous Q4H PRN Emokpae, Ejiroghene E, MD       insulin aspart (novoLOG) injection 0-9 Units  0-9 Units Subcutaneous Q6H Emokpae, Ejiroghene E, MD   1 Units at 07/06/23 0859   ondansetron (ZOFRAN) tablet 4 mg  4 mg Oral Q6H PRN Emokpae, Ejiroghene E, MD       Or   ondansetron (ZOFRAN) injection 4 mg  4 mg Intravenous Q6H PRN Emokpae, Ejiroghene E, MD       polyethylene glycol (MIRALAX / GLYCOLAX) packet 17 g  17 g Oral Daily PRN Emokpae, Ejiroghene E, MD       potassium chloride 10 mEq in 100 mL IVPB  10 mEq Intravenous Q1 Hr x 2 Danford, Earl Lites, MD 100 mL/hr at 07/06/23 0954 10 mEq at 07/06/23 9629     Discharge Medications: Please see discharge summary for a list of discharge medications.  Relevant Imaging Results:  Relevant Lab Results:   Additional Information SSN: [id number], [place]

## 2023-07-06 NOTE — Progress Notes (Signed)
  Progress Note   Patient: Paula Andrews [id number] DOB: April 04, 1945 DOA: 07/05/2023     1 DOS: the patient was seen and examined on 07/06/2023 at 8:26AM      Brief hospital course: 78 y.o. F with dementia lives in memory care, DM, HTN, hypothyroidism, HLD, COPD and recent hip fracture repair presented with 2 days decreased responsiveness.  In the ER, afebrile, Na 150.  Cr 1.47.  CTH unremarkable.  Admitted on IV fluids.     Assessment and Plan: * Acute metabolic encephalopathy No clear infection.  CTH normal.  UA and CXR clear.  Likely dehydration, hypernatremia. - Continue hypotonic IV fluids - Trend Na - Delirium precautions - Hold Ativan, Zyprexa     Hypernatremia Sodium 150 on admission.  Improving with hypotonic fluids - Continue hypotonic fluids, controlled correction as outlined by Dr. Mariea Clonts   Pressure injury of skin Stage I, right heel, present on admission - Consult WOC  Type 2 diabetes mellitus without complications (HCC) Glucose control - Check hemoglobin A1c - No need at present for sliding scale corrections  Hypothyroidism - Resume levothyroxine - Check TSH  AKI (acute kidney injury) (HCC) Creatinine 1.4, baseline  ~ 0.8. Down to 1.3 this afternoon - Hold ACEi - Continue IV fluids   Dementia with behavioral disturbance (HCC) - Resume memantine and Lexapro when able to take PO  HTN (hypertension) Blood low-normal - Hold lisinopril          Subjective: Patient remains somnoelent and poorly responsive.  Urine output very low.     Physical Exam: BP (!) 166/76 (BP Location: Right Arm)   Pulse 80   Temp 97.6 F (36.4 C) (Oral)   Resp 17   Ht 5\' 2"  (1.575 m)   Wt 55.2 kg   SpO2 97%   BMI 22.26 kg/m   Elderly adult female, lying in bed, poorly responsive to sternal rub, oropharynx extremely dry and cracked RRR, no murmurs, no peripheral edema Respiratory rate normal, lungs clear without rales or wheezes Abdomen soft without  Grammas palpation Unresponsive, has generalized weakness, appears to stir symmetrically to his noxious stimuli, face appears symmetric    Data Reviewed: Basic metabolic panel shows sodium down to 149, potassium 3.4 CBC shows hemoglobin 8.8, creatinine 1.5  Family Communication: husband by phone    Disposition: Status is: Inpatient Patient was admitted from rehab after hip repair with dehydration  Likely will need a few days of IV fluids  Overall prognosis poor given advanced dementia        Author: Alberteen Sam, MD 07/06/2023 4:26 PM  For on call review [email or web address].

## 2023-07-07 DIAGNOSIS — R338 Other retention of urine: Secondary | ICD-10-CM | POA: Insufficient documentation

## 2023-07-07 DIAGNOSIS — D649 Anemia, unspecified: Secondary | ICD-10-CM | POA: Insufficient documentation

## 2023-07-07 DIAGNOSIS — G9341 Metabolic encephalopathy: Secondary | ICD-10-CM | POA: Diagnosis not present

## 2023-07-07 LAB — URINALYSIS, W/ REFLEX TO CULTURE (INFECTION SUSPECTED)
Bacteria, UA: NONE SEEN
Bilirubin Urine: NEGATIVE
Glucose, UA: NEGATIVE mg/dL
Ketones, ur: NEGATIVE mg/dL
Leukocytes,Ua: NEGATIVE
Nitrite: NEGATIVE
Protein, ur: NEGATIVE mg/dL
Specific Gravity, Urine: 1.004 — ABNORMAL LOW (ref 1.005–1.030)
pH: 5 (ref 5.0–8.0)

## 2023-07-07 LAB — CBC
HCT: 23.7 % — ABNORMAL LOW (ref 36.0–46.0)
Hemoglobin: 7.1 g/dL — ABNORMAL LOW (ref 12.0–15.0)
MCH: 26.6 pg (ref 26.0–34.0)
MCHC: 30 g/dL (ref 30.0–36.0)
MCV: 88.8 fL (ref 80.0–100.0)
Platelets: 208 10*3/uL (ref 150–400)
RBC: 2.67 MIL/uL — ABNORMAL LOW (ref 3.87–5.11)
RDW: 15.9 % — ABNORMAL HIGH (ref 11.5–15.5)
WBC: 6.7 10*3/uL (ref 4.0–10.5)
nRBC: 0 % (ref 0.0–0.2)

## 2023-07-07 LAB — BASIC METABOLIC PANEL WITH GFR
Anion gap: 7 (ref 5–15)
BUN: 20 mg/dL (ref 8–23)
CO2: 20 mmol/L — ABNORMAL LOW (ref 22–32)
Calcium: 7.9 mg/dL — ABNORMAL LOW (ref 8.9–10.3)
Chloride: 113 mmol/L — ABNORMAL HIGH (ref 98–111)
Creatinine, Ser: 1.26 mg/dL — ABNORMAL HIGH (ref 0.44–1.00)
GFR, Estimated: 44 mL/min — ABNORMAL LOW (ref >60)
Glucose, Bld: 128 mg/dL — ABNORMAL HIGH (ref 70–99)
Potassium: 2.9 mmol/L — ABNORMAL LOW (ref 3.5–5.1)
Sodium: 140 mmol/L (ref 135–145)

## 2023-07-07 LAB — MAGNESIUM: Magnesium: 1.6 mg/dL — ABNORMAL LOW (ref 1.7–2.4)

## 2023-07-07 LAB — TSH: TSH: 3.313 u[IU]/mL (ref 0.350–4.500)

## 2023-07-07 LAB — HEMOGLOBIN A1C
Hgb A1c MFr Bld: 6.3 % — ABNORMAL HIGH (ref 4.8–5.6)
Mean Plasma Glucose: 134 mg/dL

## 2023-07-07 MED ORDER — POTASSIUM CHLORIDE 10 MEQ/100ML IV SOLN
10.0000 meq | INTRAVENOUS | Status: AC
  Administered 2023-07-07 (×2): 10 meq via INTRAVENOUS
  Filled 2023-07-07 (×4): qty 100

## 2023-07-07 MED ORDER — BUPIVACAINE-EPINEPHRINE (PF) 0.5% -1:200000 IJ SOLN
INTRAMUSCULAR | Status: AC
  Filled 2023-07-07: qty 30

## 2023-07-07 MED ORDER — ESCITALOPRAM OXALATE 10 MG PO TABS
10.0000 mg | ORAL_TABLET | Freq: Every day | ORAL | Status: DC
Start: 2023-07-07 — End: 2023-07-10
  Administered 2023-07-07 – 2023-07-10 (×4): 10 mg via ORAL
  Filled 2023-07-07 (×4): qty 1

## 2023-07-07 MED ORDER — KCL IN DEXTROSE-NACL 40-5-0.45 MEQ/L-%-% IV SOLN: INTRAVENOUS | Status: DC

## 2023-07-07 MED ORDER — MEMANTINE HCL ER 7 MG PO CP24
21.0000 mg | ORAL_CAPSULE | Freq: Every morning | ORAL | Status: DC
Start: 2023-07-08 — End: 2023-07-10
  Administered 2023-07-08 – 2023-07-09 (×2): 21 mg via ORAL
  Filled 2023-07-07 (×5): qty 3

## 2023-07-07 MED ORDER — MAGNESIUM SULFATE 2 GM/50ML IV SOLN
2.0000 g | Freq: Once | INTRAVENOUS | Status: AC
  Administered 2023-07-07: 2 g via INTRAVENOUS
  Filled 2023-07-07: qty 50

## 2023-07-07 MED ORDER — KCL IN DEXTROSE-NACL 40-5-0.9 MEQ/L-%-% IV SOLN: INTRAVENOUS | Status: AC

## 2023-07-07 MED ORDER — POTASSIUM CHLORIDE 10 MEQ/100ML IV SOLN
10.0000 meq | INTRAVENOUS | Status: AC
  Administered 2023-07-07 (×2): 10 meq via INTRAVENOUS

## 2023-07-07 NOTE — Progress Notes (Signed)
  PT screened pt.  PT is from [facility], advanced dementia with noted contractures indicating that she has not been ambulatory in a long time.  PT is not a candidate for skilled therapy.

## 2023-07-07 NOTE — Assessment & Plan Note (Signed)
-   Supplement Mag 

## 2023-07-07 NOTE — Assessment & Plan Note (Addendum)
 Retaining urine overnight 4/4, I/O cath returned >1L urine.  Retaining again 4/5, scan showed 800cc - I/O cath again and if unable to void, place foley - UA and reflex culture

## 2023-07-07 NOTE — Progress Notes (Addendum)
 Progress Note   Patient: Paula Andrews [id number] DOB: 24-Sep-1945 DOA: 07/05/2023     2 DOS: the patient was seen and examined on 07/07/2023 at 10:45 AM      Brief hospital course: 78 y.o. F with dementia lives in memory care, DM, HTN, hypothyroidism, HLD, COPD and recent hip fracture repair presented with 2 days decreased responsiveness.  In the ER, afebrile, Na 150.  Cr 1.47.  CTH unremarkable.  Admitted on IV fluids.     Assessment and Plan: * Acute metabolic encephalopathy No clear infection.  CTH normal.  UA and CXR clear.  Likely dehydration, hypernatremia.  Improved today but not taking PO - Delirium precautions - Continue IV fluids - Hold Ativan and Zyprexa - Consult palliative care   Hypernatremia Sodium 150 on admission.  Resolved today with hypotonic fluids - Transition to isotonic fluid  Pressure injury of skin Stage I, right heel, present on admission - Consult WOC  Hypokalemia - Supplement K  Type 2 diabetes mellitus without complications (HCC) Glucose control - Check hemoglobin A1c - No need at present for sliding scale corrections  Hypothyroidism TSH normal - Continue LT4   Acute urinary retention Retaining urine overnight 4/4, I/O cath returned >1L urine.  Retaining again 4/5, scan showed 800cc - I/O cath again and if unable to void, place foley - repeat UA and culture reflex  Normocytic anemia Hgb 10.6 prior to recent surgery.  8.3 at discharge, down to 7.1 today.  No bleeding reported or observed. - Check reticulocytes, nutritional levels  Hypomagnesemia - Supplement Mag  AKI (acute kidney injury) (HCC) Creatinine 1.4, baseline  ~ 0.8. Cr slightly down to 1.2 - Hold ACEi - Continue IV fluids   Dementia with behavioral disturbance (HCC) - Resume memantine and Lexapro   HTN (hypertension) BP controlled - Hold lisinopril          Subjective: Patient has no complaints, she smiles and babbles incoherently.  Nursing  have no concerns, they note she is only eating an Ensure and a few bites of food.  Refusing medicines.  No fever, no respiratory symptoms.     Physical Exam: BP 137/67 (BP Location: Left Arm)   Pulse 71   Temp 98.7 F (37.1 C) (Oral)   Resp 18   Ht 5\' 2"  (1.575 m)   Wt 55.2 kg   SpO2 95%   BMI 22.26 kg/m   Elderly adult female, lying in bed, opens eyes and makes eye contact, smiles, tries to pull off her meds RRR, no murmurs, no peripheral edema Respiratory normal, lungs clear without rales or wheezes Abdomen soft, no grimace to palpation, no distention Makes eye contact, does not respond appropriately to questions, moves upper extremities with generalized weakness but symmetric strength, face symmetric, speech incoherent due to dementia    Data Reviewed: Basic metabolic panel shows sodium down to 140, potassium down to 2.9, creatinine down to 1.2 CBC shows hemoglobin down to 7.1, magnesium 1.6 TSH normal  Family Communication: Husband by phone    Disposition: Status is: Inpatient Patient was admitted with encephalopathy after recent discharge after hip fracture  Clearly with her advanced dementia, and now this recent hip fracture, she is not able to keep up with her fluid and nutrition needs.  I suspect her prognosis is poor and I am doubtful that she will recover from this.  Recommend palliative care          Author: Alberteen Sam, MD 07/07/2023 2:50 PM  For on  call review [email or web address].

## 2023-07-07 NOTE — Plan of Care (Signed)

## 2023-07-07 NOTE — Plan of Care (Signed)

## 2023-07-07 NOTE — Progress Notes (Signed)
 Called and updated pt family. Pt only ate 10% of her lunch and drink 1 ensure. Asked pt family about coming up to help feed pt. Pt husband was unable to come at this time, pt daughter is sick and son was gone fishing. I also explained to pt husband that she was having some difficulties swallowing. Md made aware. Changes made to pt diet.

## 2023-07-07 NOTE — Assessment & Plan Note (Addendum)
 Hgb 10.6 prior to recent surgery.  8.3 at discharge, down to 7.1 today.  No bleeding reported or observed. - Check reticulocytes, nutritional levels

## 2023-07-07 NOTE — Assessment & Plan Note (Signed)
Resolved with supplementation and starting spironolactone. 

## 2023-07-08 ENCOUNTER — Inpatient Hospital Stay (HOSPITAL_COMMUNITY)

## 2023-07-08 DIAGNOSIS — R338 Other retention of urine: Secondary | ICD-10-CM

## 2023-07-08 DIAGNOSIS — F0390 Unspecified dementia without behavioral disturbance: Secondary | ICD-10-CM | POA: Insufficient documentation

## 2023-07-08 DIAGNOSIS — N179 Acute kidney failure, unspecified: Secondary | ICD-10-CM | POA: Insufficient documentation

## 2023-07-08 DIAGNOSIS — G9341 Metabolic encephalopathy: Secondary | ICD-10-CM | POA: Diagnosis not present

## 2023-07-08 DIAGNOSIS — N1831 Chronic kidney disease, stage 3a: Secondary | ICD-10-CM

## 2023-07-08 LAB — BASIC METABOLIC PANEL WITH GFR
Anion gap: 9 (ref 5–15)
BUN: 16 mg/dL (ref 8–23)
CO2: 20 mmol/L — ABNORMAL LOW (ref 22–32)
Calcium: 8.3 mg/dL — ABNORMAL LOW (ref 8.9–10.3)
Chloride: 112 mmol/L — ABNORMAL HIGH (ref 98–111)
Creatinine, Ser: 1.06 mg/dL — ABNORMAL HIGH (ref 0.44–1.00)
GFR, Estimated: 54 mL/min — ABNORMAL LOW (ref >60)
Glucose, Bld: 140 mg/dL — ABNORMAL HIGH (ref 70–99)
Potassium: 4 mmol/L (ref 3.5–5.1)
Sodium: 141 mmol/L (ref 135–145)

## 2023-07-08 LAB — FERRITIN: Ferritin: 163 ng/mL (ref 11–307)

## 2023-07-08 LAB — CBC
HCT: 28 % — ABNORMAL LOW (ref 36.0–46.0)
Hemoglobin: 8.4 g/dL — ABNORMAL LOW (ref 12.0–15.0)
MCH: 26.2 pg (ref 26.0–34.0)
MCHC: 30 g/dL (ref 30.0–36.0)
MCV: 87.2 fL (ref 80.0–100.0)
Platelets: 158 10*3/uL (ref 150–400)
RBC: 3.21 MIL/uL — ABNORMAL LOW (ref 3.87–5.11)
RDW: 15.7 % — ABNORMAL HIGH (ref 11.5–15.5)
WBC: 6.9 10*3/uL (ref 4.0–10.5)
nRBC: 0 % (ref 0.0–0.2)

## 2023-07-08 LAB — RAPID URINE DRUG SCREEN, HOSP PERFORMED
Amphetamines: NOT DETECTED
Barbiturates: NOT DETECTED
Benzodiazepines: NOT DETECTED
Cocaine: NOT DETECTED
Opiates: NOT DETECTED
Tetrahydrocannabinol: NOT DETECTED

## 2023-07-08 LAB — IRON AND TIBC
Iron: 25 ug/dL — ABNORMAL LOW (ref 28–170)
Saturation Ratios: 12 % (ref 10.4–31.8)
TIBC: 213 ug/dL — ABNORMAL LOW (ref 250–450)
UIBC: 188 ug/dL

## 2023-07-08 LAB — FOLATE: Folate: 5.2 ng/mL — ABNORMAL LOW (ref >5.9)

## 2023-07-08 LAB — VITAMIN B12: Vitamin B-12: 208 pg/mL (ref 180–914)

## 2023-07-08 LAB — MAGNESIUM: Magnesium: 1.9 mg/dL (ref 1.7–2.4)

## 2023-07-08 MED ORDER — CHLORHEXIDINE GLUCONATE CLOTH 2 % EX PADS
6.0000 | MEDICATED_PAD | Freq: Every day | CUTANEOUS | Status: DC
  Administered 2023-07-08 – 2023-07-10 (×3): 6 via TOPICAL

## 2023-07-08 MED ORDER — CYANOCOBALAMIN 1000 MCG/ML IJ SOLN
1000.0000 ug | Freq: Once | INTRAMUSCULAR | Status: AC
  Administered 2023-07-08: 1000 ug via INTRAMUSCULAR
  Filled 2023-07-08: qty 1

## 2023-07-08 MED ORDER — VITAMIN B-12 1000 MCG PO TABS
500.0000 ug | ORAL_TABLET | Freq: Every day | ORAL | Status: DC
  Administered 2023-07-09 – 2023-07-10 (×2): 500 ug via ORAL
  Filled 2023-07-08 (×2): qty 1

## 2023-07-08 MED ORDER — FOLIC ACID 1 MG PO TABS
1.0000 mg | ORAL_TABLET | Freq: Every day | ORAL | Status: DC
  Administered 2023-07-08 – 2023-07-10 (×3): 1 mg via ORAL
  Filled 2023-07-08 (×3): qty 1

## 2023-07-08 NOTE — Progress Notes (Signed)
 Pt exp an IV infiltration on rt arm while LR was running. Discontinued and removed IV. Arm site swollen, Charge nurse notified as well as [facility]. Elevated rt arm with ice pack. Pt was able to squeeze my hand with rt and and extend/flex rt arm without pain. Will continue to monitor for any further complications.

## 2023-07-08 NOTE — Plan of Care (Signed)
  Problem: Clinical Measurements: Goal: Ability to maintain clinical measurements within normal limits will improve Outcome: Progressing Goal: Will remain free from infection Outcome: Progressing Goal: Diagnostic test results will improve Outcome: Progressing   Problem: Elimination: Goal: Will not experience complications related to bowel motility Outcome: Progressing Goal: Will not experience complications related to urinary retention Outcome: Progressing   Problem: Pain Managment: Goal: General experience of comfort will improve and/or be controlled Outcome: Progressing   Problem: Fluid Volume: Goal: Ability to maintain a balanced intake and output will improve Outcome: Progressing   Problem: Health Behavior/Discharge Planning: Goal: Ability to manage health-related needs will improve Outcome: Progressing

## 2023-07-08 NOTE — Progress Notes (Signed)
 Pt very difficult IV stick with several infiltrations. This RN was called to bedside to attempt IV insertion. 24G in the left forearm was obtained. Site flushes well. Site wrapped with Kerlix. Kellogg RN

## 2023-07-08 NOTE — Plan of Care (Signed)
  Problem: Health Behavior/Discharge Planning: Goal: Ability to manage health-related needs will improve Outcome: Progressing   Problem: Clinical Measurements: Goal: Ability to maintain clinical measurements within normal limits will improve Outcome: Progressing Goal: Will remain free from infection Outcome: Progressing Goal: Diagnostic test results will improve Outcome: Progressing   Problem: Nutrition: Goal: Adequate nutrition will be maintained Outcome: Progressing   Problem: Elimination: Goal: Will not experience complications related to bowel motility Outcome: Progressing Goal: Will not experience complications related to urinary retention Outcome: Progressing   Problem: Pain Managment: Goal: General experience of comfort will improve and/or be controlled Outcome: Progressing   Problem: Safety: Goal: Ability to remain free from injury will improve Outcome: Progressing   Problem: Skin Integrity: Goal: Risk for impaired skin integrity will decrease Outcome: Progressing   Problem: Fluid Volume: Goal: Ability to maintain a balanced intake and output will improve Outcome: Progressing   Problem: Health Behavior/Discharge Planning: Goal: Ability to manage health-related needs will improve Outcome: Progressing   Problem: Skin Integrity: Goal: Risk for impaired skin integrity will decrease Outcome: Progressing   Problem: Tissue Perfusion: Goal: Adequacy of tissue perfusion will improve Outcome: Progressing

## 2023-07-08 NOTE — Progress Notes (Signed)
 PROGRESS NOTE  Paula Andrews ZOX:[id number] DOB: 09-May-1945 DOA: 07/05/2023 PCP: System, Provider Not In  Brief History:  78 year old female with a history of major neurocognitive disorder, diabetes mellitus type 2, hypertension hyperlipidemia, B12 deficiency, COPD, PE 03/2018, and hypothyroidism presenting with altered mental status from [facility].  Apparently, the patient had 2 days of somnolence, decreased oral intake.  According to the patient's daughter, the patient is able to speak albeit pleasantly confused.  History is unobtainable from the patient secondary to her major neurocognitive disorder.  Family are also difficult historians.  Family is concerned that the patient had been receiving hypnotic medications causing her somnolence.  Family states that the patient had been ambulating prior to her right hip fracture for which she underwent ORIF on 06/15/2023.  Since her right hip fracture, it appears that the patient has been essentially bedbound. In the ED, the patient was afebrile hemodynamically stable with oxygen saturation 95% room air.  WBC 7.0, hemoglobin 9.5, platelets 206.  Serum creatinine was 1.54 with serum sodium 149 on the day of admission.  CT of the brain was negative for acute findings.  She was started on IV fluids.     Assessment/Plan: Acute metabolic encephalopathy/failure to thrive -It has been difficult to decipher exactly the patient's baseline -Patient somewhat somnolent with very poor p.o. intake -Secondary to dehydration, hypernatremia, AKI -B12 208>>replete -Folic acid 5.2>>replete -TSH 3.313 -ammonia 10 -CT brain--neg -4/5 UA--neg for pyuria -MR brain -Palliative medicine consultation -UDS  Acute on chronic renal failure--CKD stage IIIa -Baseline creatinine 0.8-1.1 -Serum creatinine peaked at 1.54 -Continued IV fluids>> improved  Hypernatremia -Present with serum sodium 149 -Improved with hypotonic fluid  Urine Retention -foley  placed 07/07/23  Major neurocognitive disorder -Holding Zyprexa in the setting of somnolence -Continue Namenda -Holding lorazepam -Continue Lexapro  Hypokalemia/Hypomagnesemia -replete  Essential hypertension -Holding lisinopril in the setting of AKI -Blood pressure remains acceptable  Diabetes mellitus type 2, controlled -07/05/2023 hemoglobin A1c 6.3 -Currently not on any agents in the outpatient setting  Hypothyroidism -Continue Synthroid -TSH 3.313  Status post right femoral neck fracture -S/p right hip hemiarthroplasty 06/15/2023--Dr. Dallas Schimke -continue [place] heparin -ASA 81 mg bid after d/c  Pressure injury of skin Stage I, right heel, present on admission - Consult WOC--appreciated  Right leg edema -venous duplex        Family Communication:   daughter updated 4/6  Consultants:  palliative  Code Status:  DNR  DVT Prophylaxis:  [place] Lovenox   Procedures: As Listed in Progress Note Above  Antibiotics: None     Subjective: Pt awakens to voice and answers intermittently.  Denies CP, sob.  Remainder ROS unobtainable  Objective: Vitals:   07/07/23 1407 07/07/23 2009 07/08/23 0329 07/08/23 1300  BP: 137/67 (!) 151/53 (!) 168/73 (!) 131/50  Pulse: 71 66 72 62  Resp: 18 16 18 17   Temp: 98.7 F (37.1 C) 98.3 F (36.8 C) 98 F (36.7 C) 97.9 F (36.6 C)  TempSrc: Oral Axillary Oral Axillary  SpO2: 95% 96% 95% 96%  Weight:      Height:        Intake/Output Summary (Last 24 hours) at 07/08/2023 1530 Last data filed at 07/08/2023 1100 Gross per 24 hour  Intake 240 ml  Output 2476 ml  Net -2236 ml   Weight change:  Exam:  General:  Pt is alert, does not follow commands appropriately, not in acute distress HEENT: No icterus, No thrush,  No neck mass, [place]/AT Cardiovascular: RRR, S1/S2, no rubs, no gallops Respiratory: bibasilar rales.  No wheeze Abdomen: Soft/+BS, non tender, non distended, no guarding Extremities: 1 + RLE edema, No lymphangitis, No  petechiae, No rashes, no synovitis   Data Reviewed: I have personally reviewed following labs and imaging studies Basic Metabolic Panel: Recent Labs  Lab 07/05/23 1501 07/05/23 2025 07/06/23 0442 07/06/23 1606 07/07/23 0400 07/08/23 0450  NA 150* 151* 149* 148* 140 141  K 3.7 3.8 3.4* 4.0 2.9* 4.0  CL 119* 123* 120* 119* 113* 112*  CO2 23 16* 24 22 20* 20*  GLUCOSE 108* 99 143* 85 128* 140*  BUN 32* 30* 28* 23 20 16   CREATININE 1.47* 1.54* 1.51* 1.35* 1.26* 1.06*  CALCIUM 9.0 8.8* 8.2* 8.2* 7.9* 8.3*  MG 2.2  --   --   --  1.6* 1.9   Liver Function Tests: Recent Labs  Lab 07/05/23 1501  AST 16  ALT 11  ALKPHOS 88  BILITOT 0.8  PROT 6.3*  ALBUMIN 3.1*   No results for input(s): "LIPASE", "AMYLASE" in the last 168 hours. Recent Labs  Lab 07/05/23 1501  AMMONIA 10   Coagulation Profile: No results for input(s): "INR", "PROTIME" in the last 168 hours. CBC: Recent Labs  Lab 07/05/23 1501 07/06/23 0442 07/07/23 0400 07/08/23 0450  WBC 7.5 7.0 6.7 6.9  NEUTROABS 5.5  --   --   --   HGB 9.5* 8.8* 7.1* 8.4*  HCT 33.2* 31.4* 23.7* 28.0*  MCV 92.0 93.7 88.8 87.2  PLT 201 206 208 158   Cardiac Enzymes: No results for input(s): "CKTOTAL", "CKMB", "CKMBINDEX", "TROPONINI" in the last 168 hours. BNP: Invalid input(s): "POCBNP" CBG: Recent Labs  Lab 07/05/23 2114 07/05/23 2359 07/06/23 0322 07/06/23 0532 07/06/23 0809  GLUCAP 92 130* 148* 133* 131*   HbA1C: No results for input(s): "HGBA1C" in the last 72 hours. Urine analysis:    Component Value Date/Time   COLORURINE STRAW (A) 07/07/2023 1439   APPEARANCEUR CLEAR 07/07/2023 1439   LABSPEC 1.004 (L) 07/07/2023 1439   PHURINE 5.0 07/07/2023 1439   GLUCOSEU NEGATIVE 07/07/2023 1439   HGBUR SMALL (A) 07/07/2023 1439   BILIRUBINUR NEGATIVE 07/07/2023 1439   BILIRUBINUR small (A) 02/26/2019 1141   KETONESUR NEGATIVE 07/07/2023 1439   PROTEINUR NEGATIVE 07/07/2023 1439   UROBILINOGEN 1.0 02/26/2019  1141   NITRITE NEGATIVE 07/07/2023 1439   LEUKOCYTESUR NEGATIVE 07/07/2023 1439   Sepsis Labs: @LABRCNTIP (procalcitonin:4,lacticidven:4) )No results found for this or any previous visit (from the past 240 hours).   Scheduled Meds:  [START ON 07/09/2023] cyanocobalamin  500 mcg Oral Daily   escitalopram  10 mg Oral Daily   folic acid  1 mg Oral Daily   heparin  5,000 Units Subcutaneous Q8H   levothyroxine  75 mcg Oral Daily   memantine  21 mg Oral q morning   Continuous Infusions:  Procedures/Studies: CT Head Wo Contrast Result Date: 07/05/2023 CLINICAL DATA:  Mental status change of unknown cause. Dementia. Hyponatremia. EXAM: CT HEAD WITHOUT CONTRAST TECHNIQUE: Contiguous axial images were obtained from the base of the skull through the vertex without intravenous contrast. RADIATION DOSE REDUCTION: This exam was performed according to the departmental dose-optimization program which includes automated exposure control, adjustment of the mA and/or kV according to patient size and/or use of iterative reconstruction technique. COMPARISON:  06/14/2023 FINDINGS: Brain: Generalized atrophy. No focal abnormality seen affecting the brainstem or cerebellum. Old left occipital stroke. Advanced chronic small-vessel ischemic changes throughout the  hemispheric white matter. No sign of acute stroke, mass, hemorrhage, hydrocephalus or extra-axial collection. Vascular: There is atherosclerotic calcification of the major vessels at the base of the brain. Skull: Negative Sinuses/Orbits: Clear/normal Other: None IMPRESSION: No acute CT finding. Generalized atrophy. Old left occipital stroke. Advanced chronic small-vessel ischemic changes of the hemispheric white matter. Electronically Signed   By: Paulina Fusi M.D.   On: 07/05/2023 15:18   DG Chest Port 1 View Result Date: 07/05/2023 CLINICAL DATA:  Altered mental status. EXAM: PORTABLE CHEST 1 VIEW COMPARISON:  Chest radiograph dated 06/14/2023. FINDINGS: No focal  consolidation, pleural effusion or pneumothorax. The cardiac silhouette is within limits. Atherosclerotic calcification of the aorta. No acute osseous pathology. IMPRESSION: No active disease. Electronically Signed   By: Elgie Collard M.D.   On: 07/05/2023 12:52   DG HIP UNILAT WITH PELVIS 2-3 VIEWS RIGHT Result Date: 07/01/2023 XR of the Right hip and AP pelvis demonstrates a hip hemiarthroplasty in good position.  The hip remains reduced.  There is no evidence of implant subsidence.  No acute fractures are noted.  Impression: Right hip hemiarthroplasty in stable position, without evidence of migration or subsidence compared to prior XR   DG HIP UNILAT WITH PELVIS 2-3 VIEWS RIGHT Result Date: 06/15/2023 CLINICAL DATA:  Closed displaced fracture of right femoral neck, postop. EXAM: DG HIP (WITH OR WITHOUT PELVIS) 2-3V RIGHT COMPARISON:  Preoperative imaging. FINDINGS: Right hip arthroplasty in expected alignment. No periprosthetic lucency or fracture. Recent postsurgical change includes air and edema in the soft tissues. Lateral skin staples in place. IMPRESSION: Right hip arthroplasty without immediate postoperative complication. Electronically Signed   By: Narda Rutherford M.D.   On: 06/15/2023 15:17   CT Cervical Spine Wo Contrast Result Date: 06/14/2023 CLINICAL DATA:  Neck trauma (Age >= 65y) EXAM: CT CERVICAL SPINE WITHOUT CONTRAST TECHNIQUE: Multidetector CT imaging of the cervical spine was performed without intravenous contrast. Multiplanar CT image reconstructions were also generated. RADIATION DOSE REDUCTION: This exam was performed according to the departmental dose-optimization program which includes automated exposure control, adjustment of the mA and/or kV according to patient size and/or use of iterative reconstruction technique. COMPARISON:  02/08/2023 FINDINGS: Alignment: Straightening of normal lordosis. Unchanged trace anterolisthesis of C3 on C4. No traumatic subluxation. Skull base  and vertebrae: No acute fracture. Vertebral body heights are maintained. The dens and skull base are intact. Soft tissues and spinal canal: No prevertebral fluid or swelling. No visible canal hematoma. Disc levels: Stable degenerative disc disease, most prominent C4-C5 and C6-C7. Upper chest: Emphysema. Other: Carotid calcifications. IMPRESSION: 1. No acute fracture or subluxation of the cervical spine. 2. Stable degenerative disc disease. Electronically Signed   By: Narda Rutherford M.D.   On: 06/14/2023 19:36   CT Head Wo Contrast Result Date: 06/14/2023 CLINICAL DATA:  Head trauma, intracranial arterial injury suspected EXAM: CT HEAD WITHOUT CONTRAST TECHNIQUE: Contiguous axial images were obtained from the base of the skull through the vertex without intravenous contrast. RADIATION DOSE REDUCTION: This exam was performed according to the departmental dose-optimization program which includes automated exposure control, adjustment of the mA and/or kV according to patient size and/or use of iterative reconstruction technique. COMPARISON:  02/08/2023 FINDINGS: Brain: No intracranial hemorrhage, mass effect, or midline shift. Stable degree of atrophy no hydrocephalus. The basilar cisterns are patent. Unchanged periventricular and deep chronic small vessel ischemia. No evidence of territorial infarct or acute ischemia. No extra-axial or intracranial fluid collection. Vascular: Atherosclerosis of skullbase vasculature without hyperdense vessel or abnormal  calcification. Skull: No fracture or focal lesion. Sinuses/Orbits: Paranasal sinuses and mastoid air cells are clear. The visualized orbits are unremarkable. Other: None. IMPRESSION: 1. No acute intracranial abnormality. No skull fracture. 2. Unchanged atrophy and chronic small vessel ischemia. Electronically Signed   By: Narda Rutherford M.D.   On: 06/14/2023 19:31   DG Chest Portable 1 View Result Date: 06/14/2023 CLINICAL DATA:  Hypoxia. EXAM: PORTABLE CHEST  1 VIEW COMPARISON:  10/08/2022 FINDINGS: Lung volumes are low. Stable heart size and mediastinal contours. Mild hypoventilatory changes at the lung bases. Bronchovascular crowding versus vascular congestion. No pneumothorax or large pleural effusion. On limited assessment, no acute osseous findings. IMPRESSION: Low lung volumes with hypoventilatory changes at the lung bases. Bronchovascular crowding versus vascular congestion. Electronically Signed   By: Narda Rutherford M.D.   On: 06/14/2023 17:38   DG HIP UNILAT WITH PELVIS 2-3 VIEWS LEFT Result Date: 06/14/2023 CLINICAL DATA:  Fall, does not want ambulate. EXAM: DG HIP (WITH OR WITHOUT PELVIS) 2-3V RIGHT; DG HIP (WITH OR WITHOUT PELVIS) 2-3V LEFT COMPARISON:  None Available. FINDINGS: Right hip: Displaced right femoral neck fracture. Proximal migration of the femoral shaft. The femoral head remains seated. Left hip: No acute fracture or dislocation. Mild hip joint space narrowing. No erosive change. Pelvis: No pelvic fracture. Pubic rami are intact. The pubic symphysis and sacroiliac joints are congruent. Mild overlying artifact from clothing. IMPRESSION: 1. Displaced right femoral neck fracture. 2. No acute fracture of the left hip or pelvis. Electronically Signed   By: Narda Rutherford M.D.   On: 06/14/2023 17:37   DG HIP UNILAT WITH PELVIS 2-3 VIEWS RIGHT Result Date: 06/14/2023 CLINICAL DATA:  Fall, does not want ambulate. EXAM: DG HIP (WITH OR WITHOUT PELVIS) 2-3V RIGHT; DG HIP (WITH OR WITHOUT PELVIS) 2-3V LEFT COMPARISON:  None Available. FINDINGS: Right hip: Displaced right femoral neck fracture. Proximal migration of the femoral shaft. The femoral head remains seated. Left hip: No acute fracture or dislocation. Mild hip joint space narrowing. No erosive change. Pelvis: No pelvic fracture. Pubic rami are intact. The pubic symphysis and sacroiliac joints are congruent. Mild overlying artifact from clothing. IMPRESSION: 1. Displaced right femoral neck  fracture. 2. No acute fracture of the left hip or pelvis. Electronically Signed   By: Narda Rutherford M.D.   On: 06/14/2023 17:37    Catarina Hartshorn, DO  [facility]  If 7PM-7AM, please contact night-coverage [email or web address] Password [email or web address] 07/08/2023, 3:30 PM   LOS: 3 days

## 2023-07-09 ENCOUNTER — Inpatient Hospital Stay (HOSPITAL_COMMUNITY)
Admit: 2023-07-09 | Discharge: 2023-07-09 | Disposition: A | Discharge status: Home / Self Care | Attending: Internal Medicine | Admitting: Internal Medicine

## 2023-07-09 ENCOUNTER — Inpatient Hospital Stay (HOSPITAL_COMMUNITY)

## 2023-07-09 ENCOUNTER — Encounter (HOSPITAL_COMMUNITY): Payer: Self-pay | Admitting: Internal Medicine

## 2023-07-09 DIAGNOSIS — Z515 Encounter for palliative care: Secondary | ICD-10-CM | POA: Diagnosis not present

## 2023-07-09 DIAGNOSIS — I82403 Acute embolism and thrombosis of unspecified deep veins of lower extremity, bilateral: Secondary | ICD-10-CM | POA: Diagnosis not present

## 2023-07-09 DIAGNOSIS — R4182 Altered mental status, unspecified: Secondary | ICD-10-CM | POA: Diagnosis not present

## 2023-07-09 DIAGNOSIS — G9341 Metabolic encephalopathy: Secondary | ICD-10-CM | POA: Diagnosis not present

## 2023-07-09 DIAGNOSIS — F0390 Unspecified dementia without behavioral disturbance: Secondary | ICD-10-CM | POA: Diagnosis not present

## 2023-07-09 DIAGNOSIS — R569 Unspecified convulsions: Secondary | ICD-10-CM

## 2023-07-09 DIAGNOSIS — N179 Acute kidney failure, unspecified: Secondary | ICD-10-CM | POA: Diagnosis not present

## 2023-07-09 DIAGNOSIS — Z7189 Other specified counseling: Secondary | ICD-10-CM

## 2023-07-09 DIAGNOSIS — F03918 Unspecified dementia, unspecified severity, with other behavioral disturbance: Secondary | ICD-10-CM | POA: Diagnosis not present

## 2023-07-09 LAB — BASIC METABOLIC PANEL WITH GFR
Anion gap: 10 (ref 5–15)
BUN: 14 mg/dL (ref 8–23)
CO2: 23 mmol/L (ref 22–32)
Calcium: 8.7 mg/dL — ABNORMAL LOW (ref 8.9–10.3)
Chloride: 105 mmol/L (ref 98–111)
Creatinine, Ser: 1.07 mg/dL — ABNORMAL HIGH (ref 0.44–1.00)
GFR, Estimated: 53 mL/min — ABNORMAL LOW (ref >60)
Glucose, Bld: 107 mg/dL — ABNORMAL HIGH (ref 70–99)
Potassium: 4.1 mmol/L (ref 3.5–5.1)
Sodium: 138 mmol/L (ref 135–145)

## 2023-07-09 LAB — BLOOD GAS, VENOUS
Acid-Base Excess: 2 mmol/L (ref 0.0–2.0)
Bicarbonate: 26.5 mmol/L (ref 20.0–28.0)
Drawn by: 4237
O2 Saturation: 36.5 %
Patient temperature: 37.2
pCO2, Ven: 40 mmHg — ABNORMAL LOW (ref 44–60)
pH, Ven: 7.43 (ref 7.25–7.43)
pO2, Ven: <31 mmHg — CL (ref 32–45)

## 2023-07-09 LAB — PHOSPHORUS: Phosphorus: 3.4 mg/dL (ref 2.5–4.6)

## 2023-07-09 LAB — MAGNESIUM: Magnesium: 1.7 mg/dL (ref 1.7–2.4)

## 2023-07-09 MED ORDER — MAGNESIUM OXIDE -MG SUPPLEMENT 400 (240 MG) MG PO TABS
400.0000 mg | ORAL_TABLET | Freq: Every day | ORAL | Status: DC
  Administered 2023-07-09 – 2023-07-10 (×2): 400 mg via ORAL
  Filled 2023-07-09 (×2): qty 1

## 2023-07-09 MED ORDER — APIXABAN 5 MG PO TABS: 5.0000 mg | ORAL_TABLET | Freq: Two times a day (BID) | ORAL | Status: DC

## 2023-07-09 MED ORDER — APIXABAN 5 MG PO TABS
10.0000 mg | ORAL_TABLET | Freq: Two times a day (BID) | ORAL | Status: DC
  Administered 2023-07-09 – 2023-07-10 (×2): 10 mg via ORAL
  Filled 2023-07-09 (×2): qty 2

## 2023-07-09 MED ORDER — ORAL CARE MOUTH RINSE: 15.0000 mL | OROMUCOSAL | Status: DC | PRN

## 2023-07-09 NOTE — Plan of Care (Signed)
  Problem: Clinical Measurements: Goal: Ability to maintain clinical measurements within normal limits will improve Outcome: Progressing Goal: Will remain free from infection Outcome: Progressing   Problem: Pain Managment: Goal: General experience of comfort will improve and/or be controlled Outcome: Progressing   Problem: Safety: Goal: Ability to remain free from injury will improve Outcome: Progressing   Problem: Tissue Perfusion: Goal: Adequacy of tissue perfusion will improve Outcome: Progressing

## 2023-07-09 NOTE — Progress Notes (Signed)
 EEG complete - results pending

## 2023-07-09 NOTE — Progress Notes (Signed)
 PROGRESS NOTE  Paula Andrews ZOX:[id number] DOB: 01-Jul-1945 DOA: 07/05/2023 PCP: System, Provider Not In  Brief History:  78 year old female with a history of major neurocognitive disorder, diabetes mellitus type 2, hypertension hyperlipidemia, B12 deficiency, COPD, PE 03/2018, and hypothyroidism presenting with altered mental status from [facility].  Apparently, the patient had 2 days of somnolence, decreased oral intake.  According to the patient's daughter, the patient is able to speak albeit pleasantly confused.  History is unobtainable from the patient secondary to her major neurocognitive disorder.  Family are also difficult historians.  Family is concerned that the patient had been receiving hypnotic medications causing her somnolence.  Family states that the patient had been ambulating prior to her right hip fracture for which she underwent ORIF on 06/15/2023.  Since her right hip fracture, it appears that the patient has been essentially bedbound. In the ED, the patient was afebrile hemodynamically stable with oxygen saturation 95% room air.  WBC 7.0, hemoglobin 9.5, platelets 206.  Serum creatinine was 1.54 with serum sodium 149 on the day of admission.  CT of the brain was negative for acute findings.  She was started on IV fluids.     Assessment/Plan: Acute metabolic encephalopathy/failure to thrive -It has been difficult to decipher exactly the patient's baseline -Patient somewhat somnolent with very poor p.o. intake -Secondary to dehydration, hypernatremia, AKI -B12 208>>replete -Folic acid 5.2>>replete -TSH 3.313 -ammonia 10 -CT brain--neg -4/5 UA--neg for pyuria -MR brain--neg -Palliative medicine consultation>>d/c to SNF with palliative services -UDS--neg -EEG-neg for seizure -07/09/23--pt is more awake and interactive, but does not follow commands   Acute on chronic renal failure--CKD stage IIIa -Baseline creatinine 0.8-1.1 -Serum creatinine peaked at  1.54 -Continued IV fluids>> improved  Acute DVT--legs -4/7 US--postiive DVT--Right femoral, popliteal and calf veins;  positive DVT LEFT common femoral vein -start apixaban  Hypernatremia -Present with serum sodium 149 -Improved with hypotonic fluid -remains stable/improved off fluids now   Urine Retention -foley placed 07/07/23 -07/09/23--remove for voiding trial   Major neurocognitive disorder -Holding Zyprexa in the setting of somnolence -Continue Namenda -Holding lorazepam -Continue Lexapro   Hypokalemia/Hypomagnesemia -replete -start daily po mag   Essential hypertension -Holding lisinopril in the setting of AKI -Blood pressure remains acceptable   Diabetes mellitus type 2, controlled -07/05/2023 hemoglobin A1c 6.3 -Currently not on any agents in the outpatient setting   Hypothyroidism -Continue Synthroid -TSH 3.313   Status post right femoral neck fracture -S/p right hip hemiarthroplasty 06/15/2023--Dr. Dallas Schimke -continue Adams heparin -ASA 81 mg bid after d/c   Pressure injury of skin Stage I, right heel, present on admission - Consult WOC--appreciated            Family Communication:  son and daughter updated 4/7  Consultants: palliative   Code Status:   DNR  DVT Prophylaxis:  apixaban   Procedures: As Listed in Progress Note Above  Antibiotics: None        Subjective: Pt is awake. Denies cp.  Remainder ROS unobtainable due to encephalopathy  Objective: Vitals:   07/08/23 1300 07/08/23 1941 07/09/23 0452 07/09/23 1452  BP: (!) 131/50 116/60 (!) 143/105 (!) 108/47  Pulse: 62 67 (!) 56 64  Resp: 17 14 16 15   Temp: 97.9 F (36.6 C) 97.7 F (36.5 C) (!) 97.5 F (36.4 C) 98.2 F (36.8 C)  TempSrc: Axillary Oral Axillary Oral  SpO2: 96% 99% 96% 96%  Weight:      Height:  Intake/Output Summary (Last 24 hours) at 07/09/2023 1817 Last data filed at 07/09/2023 1745 Gross per 24 hour  Intake 240 ml  Output 1250 ml  Net -1010 ml    Weight change:  Exam:  General:  Pt is alert, follows commands appropriately, not in acute distress HEENT: No icterus, No thrush, No neck mass, [place]/AT Cardiovascular: RRR, S1/S2, no rubs, no gallops Respiratory: bibasilar rales.  No wheeze Abdomen: Soft/+BS, non tender, non distended, no guarding Extremities: 1 + LE edema, No lymphangitis, No petechiae, No rashes, no synovitis   Data Reviewed: I have personally reviewed following labs and imaging studies Basic Metabolic Panel: Recent Labs  Lab 07/05/23 1501 07/05/23 2025 07/06/23 0442 07/06/23 1606 07/07/23 0400 07/08/23 0450 07/09/23 0441  NA 150*   < > 149* 148* 140 141 138  K 3.7   < > 3.4* 4.0 2.9* 4.0 4.1  CL 119*   < > 120* 119* 113* 112* 105  CO2 23   < > 24 22 20* 20* 23  GLUCOSE 108*   < > 143* 85 128* 140* 107*  BUN 32*   < > 28* 23 20 16 14   CREATININE 1.47*   < > 1.51* 1.35* 1.26* 1.06* 1.07*  CALCIUM 9.0   < > 8.2* 8.2* 7.9* 8.3* 8.7*  MG 2.2  --   --   --  1.6* 1.9 1.7  PHOS  --   --   --   --   --   --  3.4   < > = values in this interval not displayed.   Liver Function Tests: Recent Labs  Lab 07/05/23 1501  AST 16  ALT 11  ALKPHOS 88  BILITOT 0.8  PROT 6.3*  ALBUMIN 3.1*   No results for input(s): "LIPASE", "AMYLASE" in the last 168 hours. Recent Labs  Lab 07/05/23 1501  AMMONIA 10   Coagulation Profile: No results for input(s): "INR", "PROTIME" in the last 168 hours. CBC: Recent Labs  Lab 07/05/23 1501 07/06/23 0442 07/07/23 0400 07/08/23 0450  WBC 7.5 7.0 6.7 6.9  NEUTROABS 5.5  --   --   --   HGB 9.5* 8.8* 7.1* 8.4*  HCT 33.2* 31.4* 23.7* 28.0*  MCV 92.0 93.7 88.8 87.2  PLT 201 206 208 158   Cardiac Enzymes: No results for input(s): "CKTOTAL", "CKMB", "CKMBINDEX", "TROPONINI" in the last 168 hours. BNP: Invalid input(s): "POCBNP" CBG: Recent Labs  Lab 07/05/23 2114 07/05/23 2359 07/06/23 0322 07/06/23 0532 07/06/23 0809  GLUCAP 92 130* 148* 133* 131*   HbA1C: No  results for input(s): "HGBA1C" in the last 72 hours. Urine analysis:    Component Value Date/Time   COLORURINE STRAW (A) 07/07/2023 1439   APPEARANCEUR CLEAR 07/07/2023 1439   LABSPEC 1.004 (L) 07/07/2023 1439   PHURINE 5.0 07/07/2023 1439   GLUCOSEU NEGATIVE 07/07/2023 1439   HGBUR SMALL (A) 07/07/2023 1439   BILIRUBINUR NEGATIVE 07/07/2023 1439   BILIRUBINUR small (A) 02/26/2019 1141   KETONESUR NEGATIVE 07/07/2023 1439   PROTEINUR NEGATIVE 07/07/2023 1439   UROBILINOGEN 1.0 02/26/2019 1141   NITRITE NEGATIVE 07/07/2023 1439   LEUKOCYTESUR NEGATIVE 07/07/2023 1439   Sepsis Labs: @LABRCNTIP (procalcitonin:4,lacticidven:4) )No results found for this or any previous visit (from the past 240 hours).   Scheduled Meds:  apixaban  10 mg Oral BID   Followed by   Melene Muller ON 07/16/2023] apixaban  5 mg Oral BID   Chlorhexidine Gluconate Cloth  6 each Topical Daily   cyanocobalamin  500 mcg Oral  Daily   escitalopram  10 mg Oral Daily   folic acid  1 mg Oral Daily   levothyroxine  75 mcg Oral Daily   memantine  21 mg Oral q morning   Continuous Infusions:  Procedures/Studies: EEG adult Result Date: 07/09/2023 Charlsie Quest, MD     07/09/2023  3:39 PM Patient Name: SHERION DOOLY MRN: [id number] Epilepsy Attending: Charlsie Quest Referring Physician/Provider: Catarina Hartshorn, MD Date: 07/09/2023 Duration: 22.09 mins Patient history: 78yo F woith ams. EEG to evaluate for seizure Level of alertness: Awake AEDs during EEG study: None Technical aspects: This EEG study was done with scalp electrodes positioned according to the 10-20 International system of electrode placement. Electrical activity was reviewed with band pass filter of 1-70Hz , sensitivity of 7 uV/mm, display speed of 8mm/sec with a 60Hz  notched filter applied as appropriate. EEG data were recorded continuously and digitally stored.  Video monitoring was available and reviewed as appropriate. Description: No clear posterior dominant rhythm  was seen. EEG showed continuous generalized predominantly 5 to 7 Hz theta slowing admixed with intermittent 2-3hz  delta slowing. Hyperventilation and photic stimulation were not performed.   ABNORMALITY - Continuous slow, generalized IMPRESSION: This study is suggestive of moderate diffuse encephalopathy. No seizures or epileptiform discharges were seen throughout the recording. Priyanka Annabelle Harman   US Venous Img Lower Unilateral Right (DVT) Result Date: 07/09/2023 CLINICAL DATA:  Edema, post fracture EXAM: RIGHT LOWER EXTREMITY VENOUS DOPPLER ULTRASOUND TECHNIQUE: Gray-scale sonography with compression, as well as color and duplex ultrasound, were performed to evaluate the deep venous system(s) from the level of the common femoral vein through the popliteal and proximal calf veins. COMPARISON:  None Available. FINDINGS: VENOUS Normal compressibility of the common femoral, and visualized portions of profunda femoral vein and great saphenous vein unremarkable. Noncompressible hypoechoic occlusive thrombus in the mid and distal femoral vein, popliteal vein, posterior tibial and peroneal veins. Limited views of the contralateral common femoral vein demonstrate moderately hypoechoic partially occlusive thrombus in the common femoral vein, noted to extend into deep femoral and femoral veins, distal evaluation limited due to patient combativeness. OTHER None. Limitations: none IMPRESSION: 1. Positive for occlusive DVT in the RIGHT femoral, popliteal, and calf veins. 2. Positive for partially occlusive DVT in the LEFT common femoral, deep femoral, and femoral veins. Electronically Signed   By: Corlis Leak M.D.   On: 07/09/2023 10:45   MR BRAIN WO CONTRAST Result Date: 07/08/2023 CLINICAL DATA:  Mental status change, unknown cause. Acute metabolic encephalopathy. EXAM: MRI HEAD WITHOUT CONTRAST TECHNIQUE: Multiplanar, multiecho pulse sequences of the brain and surrounding structures were obtained without intravenous  contrast. COMPARISON:  CT head without contrast 07/05/2023. MR head without contrast 05/17/2017 FINDINGS: Brain: No acute infarct, hemorrhage, or mass lesion is present. Advanced atrophy and diffuse confluent periventricular T2 hyperintensities have progressed since the prior MRI. Dilated perivascular spaces are present within the basal ganglia. Remote lacunar infarcts are present within the brainstem. A remote infarct is present in the posterior left occipital lobe. Remote lacunar infarct is present in the posterior left cerebellum. The internal auditory canals are within normal limits. Vascular: Abnormal signal is present in the right vertebral artery. Flow is present the left vertebral artery and basilar artery. Flow is present in the anterior circulation bilaterally. Skull and upper cervical spine: The craniocervical junction is normal. Upper cervical spine is within normal limits. Marrow signal is unremarkable. Sinuses/Orbits: The globes and orbits are within normal limits. Fluid is present in the mastoid air  cells, left greater than right. No obstructing nasopharyngeal lesion is present. The globes and orbits are within normal limits. Other: IMPRESSION: 1. No acute intracranial abnormality or significant interval change. 2. Advanced atrophy and diffuse confluent periventricular T2 hyperintensities have progressed since the prior MRI. This likely reflects the sequela of chronic microvascular ischemia. 3. Remote lacunar infarcts within the brainstem and posterior left cerebellum. 4. Remote infarct of the posterior left occipital lobe. 5. Abnormal signal in the right vertebral artery. This likely reflects slow flow or occlusion. Recommend CTA of the head and neck to further evaluate. 6. Fluid in the mastoid air cells, left greater than right. No obstructing nasopharyngeal lesion is present. Electronically Signed   By: Marin Roberts M.D.   On: 07/08/2023 18:11   CT Head Wo Contrast Result Date:  07/05/2023 CLINICAL DATA:  Mental status change of unknown cause. Dementia. Hyponatremia. EXAM: CT HEAD WITHOUT CONTRAST TECHNIQUE: Contiguous axial images were obtained from the base of the skull through the vertex without intravenous contrast. RADIATION DOSE REDUCTION: This exam was performed according to the departmental dose-optimization program which includes automated exposure control, adjustment of the mA and/or kV according to patient size and/or use of iterative reconstruction technique. COMPARISON:  06/14/2023 FINDINGS: Brain: Generalized atrophy. No focal abnormality seen affecting the brainstem or cerebellum. Old left occipital stroke. Advanced chronic small-vessel ischemic changes throughout the hemispheric white matter. No sign of acute stroke, mass, hemorrhage, hydrocephalus or extra-axial collection. Vascular: There is atherosclerotic calcification of the major vessels at the base of the brain. Skull: Negative Sinuses/Orbits: Clear/normal Other: None IMPRESSION: No acute CT finding. Generalized atrophy. Old left occipital stroke. Advanced chronic small-vessel ischemic changes of the hemispheric white matter. Electronically Signed   By: Paulina Fusi M.D.   On: 07/05/2023 15:18   DG Chest Port 1 View Result Date: 07/05/2023 CLINICAL DATA:  Altered mental status. EXAM: PORTABLE CHEST 1 VIEW COMPARISON:  Chest radiograph dated 06/14/2023. FINDINGS: No focal consolidation, pleural effusion or pneumothorax. The cardiac silhouette is within limits. Atherosclerotic calcification of the aorta. No acute osseous pathology. IMPRESSION: No active disease. Electronically Signed   By: Elgie Collard M.D.   On: 07/05/2023 12:52   DG HIP UNILAT WITH PELVIS 2-3 VIEWS RIGHT Result Date: 07/01/2023 XR of the Right hip and AP pelvis demonstrates a hip hemiarthroplasty in good position.  The hip remains reduced.  There is no evidence of implant subsidence.  No acute fractures are noted.  Impression: Right hip  hemiarthroplasty in stable position, without evidence of migration or subsidence compared to prior XR   DG HIP UNILAT WITH PELVIS 2-3 VIEWS RIGHT Result Date: 06/15/2023 CLINICAL DATA:  Closed displaced fracture of right femoral neck, postop. EXAM: DG HIP (WITH OR WITHOUT PELVIS) 2-3V RIGHT COMPARISON:  Preoperative imaging. FINDINGS: Right hip arthroplasty in expected alignment. No periprosthetic lucency or fracture. Recent postsurgical change includes air and edema in the soft tissues. Lateral skin staples in place. IMPRESSION: Right hip arthroplasty without immediate postoperative complication. Electronically Signed   By: Narda Rutherford M.D.   On: 06/15/2023 15:17   CT Cervical Spine Wo Contrast Result Date: 06/14/2023 CLINICAL DATA:  Neck trauma (Age >= 65y) EXAM: CT CERVICAL SPINE WITHOUT CONTRAST TECHNIQUE: Multidetector CT imaging of the cervical spine was performed without intravenous contrast. Multiplanar CT image reconstructions were also generated. RADIATION DOSE REDUCTION: This exam was performed according to the departmental dose-optimization program which includes automated exposure control, adjustment of the mA and/or kV according to patient size and/or use  of iterative reconstruction technique. COMPARISON:  02/08/2023 FINDINGS: Alignment: Straightening of normal lordosis. Unchanged trace anterolisthesis of C3 on C4. No traumatic subluxation. Skull base and vertebrae: No acute fracture. Vertebral body heights are maintained. The dens and skull base are intact. Soft tissues and spinal canal: No prevertebral fluid or swelling. No visible canal hematoma. Disc levels: Stable degenerative disc disease, most prominent C4-C5 and C6-C7. Upper chest: Emphysema. Other: Carotid calcifications. IMPRESSION: 1. No acute fracture or subluxation of the cervical spine. 2. Stable degenerative disc disease. Electronically Signed   By: Narda Rutherford M.D.   On: 06/14/2023 19:36   CT Head Wo Contrast Result  Date: 06/14/2023 CLINICAL DATA:  Head trauma, intracranial arterial injury suspected EXAM: CT HEAD WITHOUT CONTRAST TECHNIQUE: Contiguous axial images were obtained from the base of the skull through the vertex without intravenous contrast. RADIATION DOSE REDUCTION: This exam was performed according to the departmental dose-optimization program which includes automated exposure control, adjustment of the mA and/or kV according to patient size and/or use of iterative reconstruction technique. COMPARISON:  02/08/2023 FINDINGS: Brain: No intracranial hemorrhage, mass effect, or midline shift. Stable degree of atrophy no hydrocephalus. The basilar cisterns are patent. Unchanged periventricular and deep chronic small vessel ischemia. No evidence of territorial infarct or acute ischemia. No extra-axial or intracranial fluid collection. Vascular: Atherosclerosis of skullbase vasculature without hyperdense vessel or abnormal calcification. Skull: No fracture or focal lesion. Sinuses/Orbits: Paranasal sinuses and mastoid air cells are clear. The visualized orbits are unremarkable. Other: None. IMPRESSION: 1. No acute intracranial abnormality. No skull fracture. 2. Unchanged atrophy and chronic small vessel ischemia. Electronically Signed   By: Narda Rutherford M.D.   On: 06/14/2023 19:31   DG Chest Portable 1 View Result Date: 06/14/2023 CLINICAL DATA:  Hypoxia. EXAM: PORTABLE CHEST 1 VIEW COMPARISON:  10/08/2022 FINDINGS: Lung volumes are low. Stable heart size and mediastinal contours. Mild hypoventilatory changes at the lung bases. Bronchovascular crowding versus vascular congestion. No pneumothorax or large pleural effusion. On limited assessment, no acute osseous findings. IMPRESSION: Low lung volumes with hypoventilatory changes at the lung bases. Bronchovascular crowding versus vascular congestion. Electronically Signed   By: Narda Rutherford M.D.   On: 06/14/2023 17:38   DG HIP UNILAT WITH PELVIS 2-3 VIEWS  LEFT Result Date: 06/14/2023 CLINICAL DATA:  Fall, does not want ambulate. EXAM: DG HIP (WITH OR WITHOUT PELVIS) 2-3V RIGHT; DG HIP (WITH OR WITHOUT PELVIS) 2-3V LEFT COMPARISON:  None Available. FINDINGS: Right hip: Displaced right femoral neck fracture. Proximal migration of the femoral shaft. The femoral head remains seated. Left hip: No acute fracture or dislocation. Mild hip joint space narrowing. No erosive change. Pelvis: No pelvic fracture. Pubic rami are intact. The pubic symphysis and sacroiliac joints are congruent. Mild overlying artifact from clothing. IMPRESSION: 1. Displaced right femoral neck fracture. 2. No acute fracture of the left hip or pelvis. Electronically Signed   By: Narda Rutherford M.D.   On: 06/14/2023 17:37   DG HIP UNILAT WITH PELVIS 2-3 VIEWS RIGHT Result Date: 06/14/2023 CLINICAL DATA:  Fall, does not want ambulate. EXAM: DG HIP (WITH OR WITHOUT PELVIS) 2-3V RIGHT; DG HIP (WITH OR WITHOUT PELVIS) 2-3V LEFT COMPARISON:  None Available. FINDINGS: Right hip: Displaced right femoral neck fracture. Proximal migration of the femoral shaft. The femoral head remains seated. Left hip: No acute fracture or dislocation. Mild hip joint space narrowing. No erosive change. Pelvis: No pelvic fracture. Pubic rami are intact. The pubic symphysis and sacroiliac joints are congruent. Mild overlying  artifact from clothing. IMPRESSION: 1. Displaced right femoral neck fracture. 2. No acute fracture of the left hip or pelvis. Electronically Signed   By: Narda Rutherford M.D.   On: 06/14/2023 17:37    Catarina Hartshorn, DO  [facility]  If 7PM-7AM, please contact night-coverage [email or web address] Password [facility] 07/09/2023, 6:17 PM   LOS: 4 days

## 2023-07-09 NOTE — Evaluation (Signed)
 Clinical/Bedside Swallow Evaluation Patient Details  Name: Paula Andrews MRN: [id number] Date of Birth: May 15, 1945  Today's Date: 07/09/2023 Time: SLP Start Time (ACUTE ONLY): 1305 SLP Stop Time (ACUTE ONLY): 1331 SLP Time Calculation (min) (ACUTE ONLY): 26 min  Past Medical History:  Past Medical History:  Diagnosis Date   Acid reflux    Agitation due to dementia (HCC)    Anxiety    Arthritis    Dementia (HCC)    Depression    Diabetes mellitus    Hypertension    Psychosis (HCC)    Seasonal allergies    Thyroid disease    Past Surgical History:  Past Surgical History:  Procedure Laterality Date   ABDOMINAL HYSTERECTOMY     TOTAL HIP ARTHROPLASTY Right 06/15/2023   Procedure: ARTHROPLASTY, HIP, TOTAL,POSTERIOR APPROACH;  Surgeon: Oliver Barre, MD;  Location: [facility];  Service: Orthopedics;  Laterality: Right;   HPI:  78 y.o. female  with past medical history of major neurocognitive disorder, diabetes mellitus type 2, hypertension hyperlipidemia, B12 deficiency, COPD, PE 03/2018, and hypothyroidism  admitted on 07/05/2023 with acute metabolic encephalopathy/failure to thrive, acute on chronic renal failure on CKD 3. BSE requested.    Assessment / Plan / Recommendation  Clinical Impression  Clinical swallow evaluation completed at bedside after Pt repositioned upright. She has mitts on her hands and needed redirection to not chew on the mitts. Pt unable to follow directions for oral motor evaluation, but does imitate simple tasks (open mouth). She did not cough or swallow upon request. Pt with sparse dentition. She consumed ice chips, thin water via cup/straw, puree, and mechanical soft textures without overt signs of aspiration and no residuals orally after a liquid wash provided x1. Pt with one subtle throat clear, but otherwise drank sequential straw sips of thin water. Pt with mildly prolonged mastication with solids. She has been ordered D2, however she was able to masticate her  cookie and fig bar well. Recommend D3/mech soft and staff to assist with cutting up meats as needed and feeding and thin liquids via cup/straw and PO medications crushed as able in puree. SLP will follow during acute stay.  SLP Visit Diagnosis: Dysphagia, unspecified (R13.10)    Aspiration Risk  Mild aspiration risk    Diet Recommendation Dysphagia 3 (Mech soft);Thin liquid    Liquid Administration via: Cup;Straw Medication Administration: Crushed with puree Supervision: Staff to assist with self feeding;Full supervision/cueing for compensatory strategies Compensations: Slow rate;Small sips/bites Postural Changes: Seated upright at 90 degrees;Remain upright for at least 30 minutes after po intake    Other  Recommendations Oral Care Recommendations: Oral care BID    Recommendations for follow up therapy are one component of a multi-disciplinary discharge planning process, led by the attending physician.  Recommendations may be updated based on patient status, additional functional criteria and insurance authorization.  Follow up Recommendations Follow physician's recommendations for discharge plan and follow up therapies      Assistance Recommended at Discharge    Functional Status Assessment Patient has had a recent decline in their functional status and demonstrates the ability to make significant improvements in function in a reasonable and predictable amount of time.  Frequency and Duration min 2x/week  1 week       Prognosis Prognosis for improved oropharyngeal function: Fair Barriers to Reach Goals: Cognitive deficits      Swallow Study   General Date of Onset: 07/09/23 HPI: 78 y.o. female  with past medical history of major neurocognitive  disorder, diabetes mellitus type 2, hypertension hyperlipidemia, B12 deficiency, COPD, PE 03/2018, and hypothyroidism  admitted on 07/05/2023 with acute metabolic encephalopathy/failure to thrive, acute on chronic renal failure on CKD 3. BSE  requested. Type of Study: Bedside Swallow Evaluation Previous Swallow Assessment: 2023 BSE reg/thin Diet Prior to this Study: Dysphagia 2 (finely chopped);Mildly thick liquids (Level 2, nectar thick) Temperature Spikes Noted: No Respiratory Status: Room air History of Recent Intubation: No Behavior/Cognition: Alert;Cooperative;Pleasant mood;Requires cueing Oral Cavity Assessment: Within Functional Limits Oral Care Completed by SLP: Recent completion by staff Oral Cavity - Dentition: Poor condition Vision: Impaired for self-feeding Self-Feeding Abilities: Total assist Patient Positioning: Upright in bed Baseline Vocal Quality: Normal Volitional Cough: Cognitively unable to elicit Volitional Swallow: Unable to elicit    Oral/Motor/Sensory Function Overall Oral Motor/Sensory Function: Within functional limits   Ice Chips Ice chips: Within functional limits Presentation: Spoon   Thin Liquid Thin Liquid: Within functional limits Presentation: Cup;Straw    Nectar Thick Nectar Thick Liquid: Not tested   Honey Thick Honey Thick Liquid: Not tested   Puree Puree: Within functional limits Presentation: Spoon   Solid     Solid: Within functional limits Presentation: Spoon      Daymien Goth 07/09/2023,2:05 PM

## 2023-07-09 NOTE — TOC Progression Note (Addendum)
 Transition of Care [facility]) - Progression Note    Patient Details  Name: Paula Andrews MRN: [id number] Date of Birth: [birth year]  Transition of Care [facility] CM/SW Contact  Leitha Bleak, RN Phone Number: 07/09/2023, 10:15 AM  Clinical Narrative:   Palliative consulted, they reported the patient was active with [facility] while at [facility]F, post hip fracture patient went to [facility]. Palliative will reach out to [facility] with [facility] to assess for home with hospice. Family is agreeable to discuss.   Addendum  [facility] NP does not feel patient will be accepted for residential. [facility] has new bed offers. CM discussed with her spouse, he is agreeable to [facility] following for palliative. Team updated. [facility] will have a bed tomorrow.   Expected Discharge Plan: Skilled Nursing Facility Barriers to Discharge: Continued Medical Work up  Expected Discharge Plan and Services In-house Referral: Clinical Social Work Discharge Planning Services: CM Consult Post Acute Care Choice: Skilled Nursing Facility Living arrangements for the past 2 months: Skilled Nursing Facility                      Social Determinants of Health (SDOH) Interventions SDOH Screenings   Food Insecurity: Patient Unable To Answer (06/14/2023)  Housing: Patient Unable To Answer (07/06/2023)  Transportation Needs: Patient Unable To Answer (07/06/2023)  Utilities: Patient Unable To Answer (07/06/2023)  Social Connections: Patient Unable To Answer (07/06/2023)  Tobacco Use: Medium Risk (07/09/2023)    Readmission Risk Interventions    07/06/2023   10:39 AM 06/18/2023   10:05 AM 09/20/2021    3:04 PM  Readmission Risk Prevention Plan  Transportation Screening Complete Complete Complete  PCP or Specialist Appt within 5-7 Days   Complete  Home Care Screening   Complete  Medication Review (RN CM)   Complete  HRI or Home Care Consult Complete Complete   Social Work Consult for Recovery Care Planning/Counseling Complete  Complete   Palliative Care Screening Not Applicable --   Medication Review Oceanographer) Complete Complete

## 2023-07-09 NOTE — Consult Note (Signed)
 Consultation Note Date: 07/09/2023   Patient Name: Paula Andrews  DOB: 03-Jul-1945  MRN: [id number]  Age / Sex: 78 y.o., female  PCP: System, Provider Not In Referring Physician: Catarina Hartshorn, MD  Reason for Consultation: Establishing goals of care  HPI/Patient Profile: 78 y.o. female  with past medical history of major neurocognitive disorder, diabetes mellitus type 2, hypertension hyperlipidemia, B12 deficiency, COPD, PE 03/2018, and hypothyroidism  admitted on 07/05/2023 with acute metabolic encephalopathy/failure to thrive, acute on chronic renal failure on CKD 3.   Clinical Assessment and Goals of Care: Face-to-face discussion with transitional care team and bedside nursing staff related to patient condition, needs, disposition.  I have reviewed medical records including EPIC notes, labs and imaging, received report from RN, assessed the patient.  Paula Andrews is lying quietly in bed.  She appears chronically ill.  She will make and somewhat keep eye contact.  She has known dementia, but is able to tell me her name.  I am not sure that she can make her basic needs known.  There is no family at bedside at this time.  I offered Paula Andrews something to eat and drink.  She is able to easily drink from a straw.  When asked if she wants more she states yes.  I offer her a bite of cookie at bedside.  She is able to eat a cookie without issues, actually reaching to hold the cookie herself.  When offered more she states, "thank you".  Although family have been discussing residential hospice, at this time she does not appear to qualify.  Call to husband, Virna Livengood, to discuss diagnosis prognosis, GOC, EOL wishes, disposition and options.  I introduced Palliative Medicine as specialized medical care for people living with serious illness. It focuses on providing relief from the symptoms and stress of a serious illness. The  goal is to improve quality of life for both the patient and the family.  We then focused on their current illness.  We talked about [name]'s acute and chronic health concerns.  Mr. Gillingham shares that his wife had been at [facility] house memory care until her recent close right  hip fracture.  She discharged from this hospital to [facility] for rehab.  Paula Andrews had been active with [facility] for hospice services which they revoked.  We talked about disposition.  Mr. Lepak shares that their daughter spoke with the hospital attending yesterday evening and Paula Andrews is being referred to hospice services.  The natural disease trajectory and expectations at EOL were discussed.  Advanced directives, concepts specific to code status, artifical feeding and hydration, and rehospitalization were considered and discussed.  DNR verified.  Goldenrod form under [facility] tab of epic chart.  Hospice Care services outpatient were explained and offered.  Family has been active with [facility] hospice services.  They are agreeable to restart the services.  Restart the services.  Discussed the importance of continued conversation with family and the medical providers regarding overall plan of care and treatment options,  ensuring decisions are within the context of the patient's values and GOCs.  Questions and concerns were addressed.  Hard Choices booklet left for review. The family was encouraged to call with questions or concerns.  PMT will continue to support holistically.  Conference with attending, bedside nursing staff, transition of care team related to patient's condition, needs, goals of care, disposition.   HCPOA  NEXT OF KIN -husband, Rhyan Wolters.  They have 2 children [place].    SUMMARY OF RECOMMENDATIONS   Discharged to long-term care with the benefits of [facility] hospice    Code Status/Advance Care Planning: DNR  Symptom Management:  Per hospitalist, no additional needs at this  time.  Palliative Prophylaxis:  Frequent Pain Assessment and Oral Care  Additional Recommendations (Limitations, Scope, Preferences): LTC with [facility] hospice   Psycho-social/Spiritual:  Desire for further Chaplaincy support:no Additional Recommendations: Caregiving  Support/Resources and Education on Hospice  Prognosis:  < 3 months  Discharge Planning:  LTC with [facility] hospice        Primary Diagnoses: Present on Admission:  Acute metabolic encephalopathy  Hypernatremia  Pressure injury of skin  HTN (hypertension)  Hypothyroidism  Dementia with behavioral disturbance (HCC)  AKI (acute kidney injury) (HCC)  Hypokalemia   I have reviewed the medical record, interviewed the patient and family, and examined the patient. The following aspects are pertinent.  Past Medical History:  Diagnosis Date   Acid reflux    Agitation due to dementia (HCC)    Anxiety    Arthritis    Dementia (HCC)    Depression    Diabetes mellitus    Hypertension    Psychosis (HCC)    Seasonal allergies    Thyroid disease    Social History   Socioeconomic History   Marital status: Married    Spouse name: Not on file   Number of children: Not on file   Years of education: 9   Highest education level: Not on file  Occupational History    Employer: RETIRED  Tobacco Use   Smoking status: Former    Current packs/day: 0.00    Average packs/day: 1 pack/day for 41.0 years (41.0 ttl pk-yrs)    Types: Cigarettes    Start date: 04/03/1960    Quit date: 04/03/2001    Years since quitting: 22.2   Smokeless tobacco: Never  Vaping Use   Vaping status: Never Used  Substance and Sexual Activity   Alcohol use: No   Drug use: No   Sexual activity: Not Currently  Other Topics Concern   Not on file  Social History Narrative   Not on file   Social Drivers of Health   Financial Resource Strain: Not on file  Food Insecurity: Patient Unable To Answer (06/14/2023)   Hunger Vital Sign    Worried About  Running Out of Food in the Last Year: Patient unable to answer    Ran Out of Food in the Last Year: Patient unable to answer  Transportation Needs: Patient Unable To Answer (07/06/2023)   PRAPARE - Transportation    Lack of Transportation (Medical): Patient unable to answer    Lack of Transportation (Non-Medical): Patient unable to answer  Physical Activity: Not on file  Stress: Not on file  Social Connections: Patient Unable To Answer (07/06/2023)   Social Connection and Isolation Panel [NHANES]    Frequency of Communication with Friends and Family: Patient unable to answer    Frequency of Social Gatherings with Friends and Family: Patient unable  to answer    Attends Religious Services: Patient unable to answer    Active Member of Clubs or Organizations: Patient unable to answer    Attends Club or Organization Meetings: Patient unable to answer    Marital Status: Patient unable to answer   Family History  Problem Relation Age of Onset   Diabetes Other    Scheduled Meds:  Chlorhexidine Gluconate Cloth  6 each Topical Daily   cyanocobalamin  500 mcg Oral Daily   escitalopram  10 mg Oral Daily   folic acid  1 mg Oral Daily   heparin  5,000 Units Subcutaneous Q8H   levothyroxine  75 mcg Oral Daily   memantine  21 mg Oral q morning   Continuous Infusions: PRN Meds:.acetaminophen **OR** acetaminophen, hydrALAZINE, ondansetron **OR** ondansetron (ZOFRAN) IV, mouth rinse, polyethylene glycol Medications Prior to Admission:  Prior to Admission medications   Medication Sig Start Date End Date Taking? Authorizing Provider  acetaminophen (TYLENOL) 325 MG tablet Take 2 tablets (650 mg total) by mouth 2 (two) times daily as needed for mild pain or moderate pain. 11/28/19  Yes Blane Ohara, MD  alum & mag hydroxide-simeth (MI-ACID) 200-200-20 MG/5ML suspension Take 30 mLs by mouth every 6 (six) hours as needed for indigestion or heartburn.   Yes [provider]  aspirin EC 81 MG tablet  Take 1 tablet (81 mg total) by mouth 2 (two) times daily. Swallow whole. 06/19/23 07/24/23 Yes Johnson, Clanford L, MD  escitalopram (LEXAPRO) 10 MG tablet Take 10 mg by mouth daily. 05/14/23  Yes [provider]  levothyroxine (SYNTHROID) 75 MCG tablet Take 75 mcg by mouth daily. 01/09/23  Yes [provider]  lisinopril (ZESTRIL) 5 MG tablet Take 1 tablet (5 mg total) by mouth every morning. 06/19/23  Yes Johnson, Clanford L, MD  loperamide (IMODIUM) 2 MG capsule Take 2 mg by mouth every 3 (three) hours as needed for diarrhea or loose stools (max 8 doses in 24 hours). Max 8 doses in 24 hours   Yes [provider]  LORazepam (ATIVAN) 0.5 MG tablet Take 1 tablet (0.5 mg total) by mouth every 8 (eight) hours as needed for anxiety, seizure, sleep or sedation. Patient taking differently: Take 0.5 mg by mouth every 12 (twelve) hours as needed for anxiety, seizure, sleep or sedation. 06/19/23  Yes Johnson, Clanford L, MD  melatonin 1 MG TABS tablet Take 1 mg by mouth at bedtime.   Yes [provider]  memantine (NAMENDA XR) 21 MG CP24 24 hr capsule Take 1 capsule (21 mg total) by mouth every morning. Patient taking differently: Take 21 mg by mouth in the morning and at bedtime. 06/19/23  Yes Johnson, Clanford L, MD  Multiple Vitamin (MULTIVITAMIN) tablet Take 1 tablet by mouth daily.   Yes [provider]  Nutritional Supplement LIQD Take 240 mLs by mouth in the morning, at noon, and at bedtime.   Yes [provider]  OLANZapine (ZYPREXA) 10 MG tablet Take 10 mg by mouth at bedtime. 05/14/23  Yes [provider]  omeprazole (PRILOSEC OTC) 20 MG tablet Take 1 tablet (20 mg total) by mouth every morning. 06/19/23 06/18/24 Yes Johnson, Clanford L, MD  ondansetron (ZOFRAN) 4 MG tablet Take 4 mg by mouth every 4 (four) hours as needed for nausea or vomiting. 05/17/23  Yes [provider]  Vitamin D, Cholecalciferol, 10 MCG (400 UNIT) TABS Take 800  Units by mouth every morning.   Yes [provider]   Allergies  Allergen Reactions   Lorazepam Other (See Comments)    Hallucinations No allergies per [facility] and pt's family members   Review of Systems  Unable to perform ROS: Dementia    Physical Exam Vitals and nursing note reviewed.  Constitutional:      General: She is not in acute distress.    Appearance: She is ill-appearing.  HENT:     Mouth/Throat:     Mouth: Mucous membranes are moist.  Cardiovascular:     Rate and Rhythm: Normal rate.  Pulmonary:     Effort: Pulmonary effort is normal. No respiratory distress.  Skin:    General: Skin is warm and dry.  Neurological:     Mental Status: She is alert.     Comments: Known dementia, self only   Psychiatric:        Mood and Affect: Mood normal.        Behavior: Behavior normal.     Vital Signs: BP (!) 143/105 (BP Location: Left Arm)   Pulse (!) 56   Temp (!) 97.5 F (36.4 C) (Axillary)   Resp 16   Ht 5\' 2"  (1.575 m)   Wt 55.2 kg   SpO2 96%   BMI 22.26 kg/m  Pain Scale: PAINAD   Pain Score: 0-No pain   SpO2: SpO2: 96 % O2 Device:SpO2: 96 % O2 Flow Rate: .O2 Flow Rate (L/min): 1 L/min  IO: Intake/output summary:  Intake/Output Summary (Last 24 hours) at 07/09/2023 0924 Last data filed at 07/09/2023 0454 Gross per 24 hour  Intake --  Output 1225 ml  Net -1225 ml    LBM: Last BM Date : 07/07/23 Baseline Weight: Weight: 52 kg Most recent weight: Weight: 55.2 kg     Palliative Assessment/Data:     Time In: 0900  Time Out: 0955 Time Total: 55 minutes  Greater than 50%  of this time was spent counseling and coordinating care related to the above assessment and plan.  Signed by: Katheran Awe, NP   Please contact Palliative Medicine Team phone at [phone] for questions and concerns.  For individual provider: See Loretha Stapler

## 2023-07-09 NOTE — Plan of Care (Signed)
  Problem: Pain Managment: Goal: General experience of comfort will improve and/or be controlled Outcome: Progressing   Problem: Clinical Measurements: Goal: Ability to maintain clinical measurements within normal limits will improve Outcome: Not Progressing   Problem: Skin Integrity: Goal: Risk for impaired skin integrity will decrease Outcome: Not Progressing   Problem: Health Behavior/Discharge Planning: Goal: Ability to manage health-related needs will improve Outcome: Not Progressing

## 2023-07-09 NOTE — Procedures (Signed)
 Patient Name: Paula Andrews  MRN: [id number]  Epilepsy Attending: Charlsie Quest  Referring Physician/Provider: Catarina Hartshorn, MD  Date: 07/09/2023  Duration: 22.09 mins  Patient history: 78yo F woith ams. EEG to evaluate for seizure  Level of alertness: Awake  AEDs during EEG study: None  Technical aspects: This EEG study was done with scalp electrodes positioned according to the 10-20 International system of electrode placement. Electrical activity was reviewed with band pass filter of 1-70Hz , sensitivity of 7 uV/mm, display speed of 95mm/sec with a 60Hz  notched filter applied as appropriate. EEG data were recorded continuously and digitally stored.  Video monitoring was available and reviewed as appropriate.  Description: No clear posterior dominant rhythm was seen.  EEG showed continuous generalized predominantly 5 to 7 Hz theta slowing admixed with intermittent 2-3hz  delta slowing. Hyperventilation and photic stimulation were not performed.     ABNORMALITY - Continuous slow, generalized  IMPRESSION: This study is suggestive of moderate diffuse encephalopathy. No seizures or epileptiform discharges were seen throughout the recording.  Jamillah Camilo Annabelle Harman

## 2023-07-10 ENCOUNTER — Telehealth (HOSPITAL_COMMUNITY): Payer: Self-pay | Admitting: Pharmacy Technician

## 2023-07-10 ENCOUNTER — Other Ambulatory Visit (HOSPITAL_COMMUNITY): Payer: Self-pay

## 2023-07-10 DIAGNOSIS — Z7189 Other specified counseling: Secondary | ICD-10-CM | POA: Diagnosis not present

## 2023-07-10 DIAGNOSIS — F03918 Unspecified dementia, unspecified severity, with other behavioral disturbance: Secondary | ICD-10-CM | POA: Diagnosis not present

## 2023-07-10 DIAGNOSIS — G9341 Metabolic encephalopathy: Secondary | ICD-10-CM | POA: Diagnosis not present

## 2023-07-10 DIAGNOSIS — Z515 Encounter for palliative care: Secondary | ICD-10-CM | POA: Diagnosis not present

## 2023-07-10 MED ORDER — APIXABAN 5 MG PO TABS: ORAL_TABLET | ORAL | 0 refills | Status: AC

## 2023-07-10 MED ORDER — ORAL CARE MOUTH RINSE
15.0000 mL | OROMUCOSAL | 0 refills | Status: AC | PRN
Start: 2023-07-10 — End: ?

## 2023-07-10 NOTE — Care Management Important Message (Signed)
 Important Message  Patient Details  Name: Paula Andrews MRN: [id number] Date of Birth: 14-Mar-1946   Important Message Given:  Yes - Medicare IM     Corey Harold 07/10/2023, 10:52 AM

## 2023-07-10 NOTE — Discharge Instructions (Addendum)
 Information on my medicine - ELIQUIS (apixaban)  Why was Eliquis prescribed for you? Eliquis was prescribed to treat blood clots that may have been found in the veins of your legs (deep vein thrombosis) or in your lungs (pulmonary embolism) and to reduce the risk of them occurring again.  What do You need to know about Eliquis ? The starting dose is 10 mg (two 5 mg tablets) taken TWICE daily for the FIRST SEVEN (7) DAYS, then on 07/16/23  the dose is reduced to ONE 5 mg tablet taken TWICE daily.  Eliquis may be taken with or without food.   Try to take the dose about the same time in the morning and in the evening. If you have difficulty swallowing the tablet whole please discuss with your pharmacist how to take the medication safely.  Take Eliquis exactly as prescribed and DO NOT stop taking Eliquis without talking to the doctor who prescribed the medication.  Stopping may increase your risk of developing a new blood clot.  Refill your prescription before you run out.  After discharge, you should have regular check-up appointments with your healthcare provider that is prescribing your Eliquis.    What do you do if you miss a dose? If a dose of ELIQUIS is not taken at the scheduled time, take it as soon as possible on the same day and twice-daily administration should be resumed. The dose should not be doubled to make up for a missed dose.  Important Safety Information A possible side effect of Eliquis is bleeding. You should call your healthcare provider right away if you experience any of the following: Bleeding from an injury or your nose that does not stop. Unusual colored urine (red or dark brown) or unusual colored stools (red or black). Unusual bruising for unknown reasons. A serious fall or if you hit your head (even if there is no bleeding).  Some medicines may interact with Eliquis and might increase your risk of bleeding or clotting while on Eliquis. To help avoid this,  consult your healthcare provider or pharmacist prior to using any new prescription or non-prescription medications, including herbals, vitamins, non-steroidal anti-inflammatory drugs (NSAIDs) and supplements.  This website has more information on Eliquis (apixaban): http://www.eliquis.com/eliquis/home

## 2023-07-10 NOTE — Progress Notes (Signed)
 Palliative:   Mrs. Asselin is lying quietly in bed.  She appears chronically ill and frail.  She is resting comfortably, but wakes when I speak to her for a few moments.  Initially, she remains quite sleepy.  She has known dementia therefore I do not ask orientation questions.  I am not sure that she can make her basic needs known.  There is no family at bedside at this time.  I offer Mrs. Cotta something to drink.  She is able to take a's few sips from her water cup.  I offer her bites of banana at bedside.  After some prompting, she is able to hold the banana herself and feed herself.  She does take very small bites.  She clearly needs to be fed by others and would benefit from hand-held foods.  Conference with attending, bedside nursing staff, transition of care team related to patient condition, needs, goals of care, disposition.  Plan: Continue to treat the treatable but no CPR or intubation.  [facility] for long-term care.  To be followed by [facility] outpatient palliative initially with continued discussions around transitioning to hospice care.       35 minutes  Lillia Carmel, NP Palliative medicine team Team phone [phone]

## 2023-07-10 NOTE — Progress Notes (Signed)
 Bladder scanned patient, bladder scan noted 455 mL in bladder. MD Thomes Dinning made aware. New order placed for in and out cath. In and out cath completed, output of 550 mL, MD Adefeso made aware.

## 2023-07-10 NOTE — TOC Transition Note (Signed)
 Transition of Care [facility]) - Discharge Note   Patient Details  Name: Paula Andrews MRN: [id number] Date of Birth: 1946/03/19  Transition of Care [facility]) CM/SW Contact:  Leitha Bleak, RN Phone Number: 07/10/2023, 10:30 AM   Clinical Narrative:   Patient discharging to [facility] CM updated her husband. Clinicals sent in the hub, Revonda Standard provided room number and number for report. TOC will call EMS when RN is ready. Updated Morrie Sheldon with [facility] to follow for outpatient palliative and advised they need to discuss hospice.     Barriers to Discharge: Barriers Resolved   Patient Goals and CMS Choice Patient states their goals for this hospitalization and ongoing recovery are:: go to SNF CMS Medicare.gov Compare Post Acute Care list provided to:: Patient Represenative (must comment) Choice offered to / list presented to : Spouse [place] ownership interest in [facility].provided to:: Spouse    Discharge Placement                Patient to be transferred to facility by: EMS Name of family member notified: Reita Cliche - Husband Patient and family notified of of transfer: 07/10/23  Discharge Plan and Services Additional resources added to the After Visit Summary for   In-house Referral: Clinical Social Work Discharge Planning Services: CM Consult Post Acute Care Choice: Skilled Nursing Facility                Social Drivers of Health (SDOH) Interventions SDOH Screenings   Food Insecurity: Patient Unable To Answer (06/14/2023)  Housing: Patient Unable To Answer (07/06/2023)  Transportation Needs: Patient Unable To Answer (07/06/2023)  Utilities: Patient Unable To Answer (07/06/2023)  Social Connections: Patient Unable To Answer (07/06/2023)  Tobacco Use: Medium Risk (07/09/2023)     Readmission Risk Interventions    07/06/2023   10:39 AM 06/18/2023   10:05 AM 09/20/2021    3:04 PM  Readmission Risk Prevention Plan  Transportation Screening Complete Complete Complete  PCP or  Specialist Appt within 5-7 Days   Complete  Home Care Screening   Complete  Medication Review (RN CM)   Complete  HRI or Home Care Consult Complete Complete   Social Work Consult for Recovery Care Planning/Counseling Complete Complete   Palliative Care Screening Not Applicable --   Medication Review Oceanographer) Complete Complete

## 2023-07-10 NOTE — Progress Notes (Signed)
 Foley removed during previous shift, noted patient had not voided. Bladder scanned patient, bladder scan noted 400 mL in bladder. MD Thomes Dinning made aware. New order placed for in and out cath. In and out cath completed, output 500 mLs. MD made aware.

## 2023-07-10 NOTE — Telephone Encounter (Signed)
 Patient Product/process development scientist completed.    The patient is insured through [employer]. Patient has Medicare and is not eligible for a copay card, but may be able to apply for patient assistance or Medicare RX Payment Plan (Patient Must reach out to their plan, if eligible for payment plan), if available.    Ran test claim for Eliquis 5 mg and the current 30 day co-pay is $4.80.   This test claim was processed through [facility]- copay amounts may vary at other pharmacies due to pharmacy/plan contracts, or as the patient moves through the different stages of their insurance plan.     Roland Earl, CPHT Pharmacy Technician III Certified Patient Advocate [facility] Patient Advocate Team Direct Number: [phone]  Fax: [phone]

## 2023-07-10 NOTE — Plan of Care (Signed)
  Problem: Clinical Measurements: Goal: Will remain free from infection Outcome: Progressing   Problem: Skin Integrity: Goal: Risk for impaired skin integrity will decrease Outcome: Progressing   Problem: Tissue Perfusion: Goal: Adequacy of tissue perfusion will improve Outcome: Progressing

## 2023-07-10 NOTE — Progress Notes (Signed)
 Pt was previously bladder scanned by NT, 400 mL resulted, assigned nurse and MD notified. Order placed to in/out pt. 400 mL emptied from bladder.

## 2023-07-10 NOTE — Discharge Summary (Signed)
 Physician Discharge Summary   Patient: Paula Andrews MRN: [id number] DOB: 08-23-1945  Admit date:     07/05/2023  Discharge date: 07/10/23  Discharge Physician: Kendell Bane   PCP: System, Provider Not In   Recommendations at discharge:   Follow-up with palliative care -possible referral to hospice as soon as possible Will hold oral medication severe confusion, change in mental status.  Discharge Diagnoses: Principal Problem:   Acute metabolic encephalopathy Active Problems:   Hypernatremia   Pressure injury of skin   Hypokalemia   Type 2 diabetes mellitus without complications (HCC)   Hypothyroidism   HTN (hypertension)   Dementia with behavioral disturbance (HCC)   AKI (acute kidney injury) (HCC)   Hypomagnesemia   Normocytic anemia   Acute urinary retention   Acute renal failure superimposed on stage 3a chronic kidney disease (HCC)   Major neurocognitive disorder (HCC)   Leg DVT (deep venous thromboembolism), acute, bilateral (HCC)  Paula Andrews is a 78 year old female with a history of major neurocognitive disorder, diabetes mellitus type 2, hypertension hyperlipidemia, B12 deficiency, COPD, PE 03/2018, and hypothyroidism presenting with altered mental status from [facility].  Apparently, the patient had 2 days of somnolence, decreased oral intake.  According to the patient's daughter, the patient is able to speak albeit pleasantly confused.  History is unobtainable from the patient secondary to her major neurocognitive disorder.  Family are also difficult historians.  Family is concerned that the patient had been receiving hypnotic medications causing her somnolence.  Family states that the patient had been ambulating prior to her right hip fracture for which she underwent ORIF on 06/15/2023.  Since her right hip fracture, it appears that the patient has been essentially bedbound.  In the ED, the patient was afebrile hemodynamically stable with oxygen saturation 95%  room air.  WBC 7.0, hemoglobin 9.5, platelets 206.  Serum creatinine was 1.54 with serum sodium 149 on the day of admission.  CT of the brain was negative for acute findings.  She was started on IV fluids.   Acute metabolic encephalopathy/failure to thrive -It has been difficult to decipher exactly the patient's baseline - Pains,, comfortable, somnolent, does not follow verbal command, poor p.o. intake -Secondary to dehydration, hypernatremia, AKI -B12 208>>replete -Folic acid 5.2>>replete -TSH 3.313 -ammonia 10 -CT brain--neg -4/5 UA--neg for pyuria -MR brain--neg  -Palliative medicine consultation>>d/c to SNF with palliative services -UDS--neg -EEG-neg for seizure -07/09/23--pt is more awake and interactive, but does not follow commands   Acute on chronic renal failure--CKD stage IIIa -Baseline creatinine 0.8-1.1 -Serum creatinine peaked at 1.54 -Continued IV fluids>> improved   Acute DVT--legs -4/7 US--postiive DVT--Right femoral, popliteal and calf veins;  positive DVT LEFT common femoral vein -start apixaban---    Hypernatremia -Present with serum sodium 149 -Improved with hypotonic fluid    Urine Retention -foley placed 07/07/23 -07/09/23--remove for voiding trial   Major neurocognitive disorder -Holding Zyprexa in the setting of somnolence-discontinued -Continue Namenda -Holding lorazepam-discontinued -Continue Lexapro   Hypokalemia/Hypomagnesemia -replete    Essential hypertension -Discontinued lisinopril in the setting of AKI    Diabetes mellitus type 2, controlled -07/05/2023 hemoglobin A1c 6.3 -Currently not on any agents in the outpatient setting   Hypothyroidism -Continue Synthroid -TSH 3.313   Status post right femoral neck fracture -S/p right hip hemiarthroplasty 06/15/2023--Dr. Dallas Schimke -On Eliquis for DVT   Pressure injury of skin Stage I, right heel, present on admission - Continue wound care     Ethics: DNR/DNI status established, due to  progressive dementia, physical mental decline, and comorbidities, we determined the prognosis remains very poor, referral to palliative care with goal to transition to hospice  Family Communication:  son and daughter updated 4/7   Consultants: palliative    Code Status:   DNR      Consultants: Palliative care  Disposition: Skilled nursing facility Diet recommendation:  Discharge Diet Orders (From admission, onward)     Start     Ordered   07/10/23 0000  Diet - low sodium heart healthy        07/10/23 1019           Regular diet DISCHARGE MEDICATION: Allergies as of 07/10/2023       Reactions   Lorazepam Other (See Comments)   Hallucinations No allergies per [facility] and pt's family members        Medication List     STOP taking these medications    aspirin EC 81 MG tablet   lisinopril 5 MG tablet Commonly known as: ZESTRIL   loperamide 2 MG capsule Commonly known as: IMODIUM   LORazepam 0.5 MG tablet Commonly known as: ATIVAN   OLANZapine 10 MG tablet Commonly known as: ZYPREXA   sodium chloride 0.9 % Soln   Vitamin D (Cholecalciferol) 10 MCG (400 UNIT) Tabs       TAKE these medications    acetaminophen 325 MG tablet Commonly known as: TYLENOL Take 2 tablets (650 mg total) by mouth 2 (two) times daily as needed for mild pain or moderate pain.   apixaban 5 MG Tabs tablet Commonly known as: ELIQUIS Take 2 tablets (10 mg total) by mouth 2 (two) times daily for 3 days, THEN 1 tablet (5 mg total) 2 (two) times daily. Start taking on: July 10, 2023   escitalopram 10 MG tablet Commonly known as: LEXAPRO Take 10 mg by mouth daily.   levothyroxine 75 MCG tablet Commonly known as: SYNTHROID Take 75 mcg by mouth daily.   melatonin 1 MG Tabs tablet Take 1 mg by mouth at bedtime.   memantine 21 MG Cp24 24 hr capsule Commonly known as: NAMENDA XR Take 1 capsule (21 mg total) by mouth every morning. What changed: when to take this   Mi-Acid  200-200-20 MG/5ML suspension Generic drug: alum & mag hydroxide-simeth Take 30 mLs by mouth every 6 (six) hours as needed for indigestion or heartburn.   mouth rinse Liqd solution 15 mLs by Mouth Rinse route as needed (for oral care).   multivitamin tablet Take 1 tablet by mouth daily.   Nutritional Supplement Liqd Take 240 mLs by mouth in the morning, at noon, and at bedtime.   omeprazole 20 MG tablet Commonly known as: PriLOSEC OTC Take 1 tablet (20 mg total) by mouth every morning.   ondansetron 4 MG tablet Commonly known as: ZOFRAN Take 4 mg by mouth every 4 (four) hours as needed for nausea or vomiting.               Discharge Care Instructions  (From admission, onward)           Start     Ordered   07/10/23 0000  Discharge wound care:       Comments: Cleanse R heel with soap and water, dry and apply Xeroform gauze [place] [phone]) to wound daily.  Cover with dry gauze and Kerlix roll gauze. Place R foot in Prevalon boot [place] [phone]) to offload pressure.  Pressure Injury 07/05/23 Buttocks Right Stage 2 -  Partial thickness loss  of dermis presenting as a shallow open injury with a red, pink wound bed without slough. 1cm x 0.5cm x 0 cm 4 days   Pressure Injury 07/05/23 Heel Right Deep Tissue Pressure Injury - Purple or maroon localized area of discolored intact skin or blood-filled blister due to damage of underlying soft tissue from pressure and/or shear. 3cm x 3xm   07/10/23 1019            Contact information for after-discharge care     Destination     [facility] .   Service: Skilled Nursing Contact information: [address] [place] [address] [phone]                    Discharge Exam: Ceasar Mons Weights   07/05/23 1110 07/05/23 1730  Weight: 52 kg 55.2 kg        General:  Pleasantly confused, calm, does not follow verbal command  HEENT:  Normocephalic, PERRL, otherwise with in Normal  limits   Neuro:  Exam, confused, does not follow verbal command  CNII-XII intact. , normal motor and sensation, reflexes intact   Lungs:   Clear to auscultation BL, Respirations unlabored,  No wheezes / crackles  Cardio:    S1/S2, RRR, No murmure, No Rubs or Gallops   Abdomen:  Soft, non-tender, bowel sounds active all four quadrants, no guarding or peritoneal signs.  Muscular  skeletal:  Limited exam -severe global generalized weaknesses Bedbound - in bed, able to move all 4 extremities,   2+ pulses,  symmetric, No pitting edema  Skin:  Dry, warm to touch, negative for any Rashes,  Wounds: Please see nursing documentation  Pressure Injury 07/05/23 Heel Right Deep Tissue Pressure Injury - Purple or maroon localized area of discolored intact skin or blood-filled blister due to damage of underlying soft tissue from pressure and/or shear. 3cm x 3xm (Active)  07/05/23 1730  Location: Heel  Location Orientation: Right  Staging: Deep Tissue Pressure Injury - Purple or maroon localized area of discolored intact skin or blood-filled blister due to damage of underlying soft tissue from pressure and/or shear.  Wound Description (Comments): 3cm x 3xm  Present on Admission: Yes  Dressing Type -- (Xeroform, Kerlex) 07/10/23 0601     Pressure Injury 07/05/23 Buttocks Right Stage 2 -  Partial thickness loss of dermis presenting as a shallow open injury with a red, pink wound bed without slough. 1cm x 0.5cm x 0 cm (Active)  07/05/23 1730  Location: Buttocks  Location Orientation: Right  Staging: Stage 2 -  Partial thickness loss of dermis presenting as a shallow open injury with a red, pink wound bed without slough.  Wound Description (Comments): 1cm x 0.5cm x 0 cm  Present on Admission: Yes  Dressing Type Foam - Lift dressing to assess site every shift 07/08/23 1941          Condition at discharge: stable  The results of significant diagnostics from this hospitalization (including imaging,  microbiology, ancillary and laboratory) are listed below for reference.   Imaging Studies: EEG adult Result Date: 07/09/2023 Charlsie Quest, MD     07/09/2023  3:39 PM Patient Name: MAZELLE HUEBERT MRN: [id number] Epilepsy Attending: Charlsie Quest Referring Physician/Provider: Catarina Hartshorn, MD Date: 07/09/2023 Duration: 22.09 mins Patient history: 78yo F woith ams. EEG to evaluate for seizure Level of alertness: Awake AEDs during EEG study: None Technical aspects: This EEG study was done with scalp electrodes positioned according to  the 10-20 International system of electrode placement. Electrical activity was reviewed with band pass filter of 1-70Hz , sensitivity of 7 uV/mm, display speed of 37mm/sec with a 60Hz  notched filter applied as appropriate. EEG data were recorded continuously and digitally stored.  Video monitoring was available and reviewed as appropriate. Description: No clear posterior dominant rhythm was seen. EEG showed continuous generalized predominantly 5 to 7 Hz theta slowing admixed with intermittent 2-3hz  delta slowing. Hyperventilation and photic stimulation were not performed.   ABNORMALITY - Continuous slow, generalized IMPRESSION: This study is suggestive of moderate diffuse encephalopathy. No seizures or epileptiform discharges were seen throughout the recording. Priyanka Annabelle Harman   US Venous Img Lower Unilateral Right (DVT) Result Date: 07/09/2023 CLINICAL DATA:  Edema, post fracture EXAM: RIGHT LOWER EXTREMITY VENOUS DOPPLER ULTRASOUND TECHNIQUE: Gray-scale sonography with compression, as well as color and duplex ultrasound, were performed to evaluate the deep venous system(s) from the level of the common femoral vein through the popliteal and proximal calf veins. COMPARISON:  None Available. FINDINGS: VENOUS Normal compressibility of the common femoral, and visualized portions of profunda femoral vein and great saphenous vein unremarkable. Noncompressible hypoechoic occlusive thrombus  in the mid and distal femoral vein, popliteal vein, posterior tibial and peroneal veins. Limited views of the contralateral common femoral vein demonstrate moderately hypoechoic partially occlusive thrombus in the common femoral vein, noted to extend into deep femoral and femoral veins, distal evaluation limited due to patient combativeness. OTHER None. Limitations: none IMPRESSION: 1. Positive for occlusive DVT in the RIGHT femoral, popliteal, and calf veins. 2. Positive for partially occlusive DVT in the LEFT common femoral, deep femoral, and femoral veins. Electronically Signed   By: Corlis Leak M.D.   On: 07/09/2023 10:45   MR BRAIN WO CONTRAST Result Date: 07/08/2023 CLINICAL DATA:  Mental status change, unknown cause. Acute metabolic encephalopathy. EXAM: MRI HEAD WITHOUT CONTRAST TECHNIQUE: Multiplanar, multiecho pulse sequences of the brain and surrounding structures were obtained without intravenous contrast. COMPARISON:  CT head without contrast 07/05/2023. MR head without contrast 05/17/2017 FINDINGS: Brain: No acute infarct, hemorrhage, or mass lesion is present. Advanced atrophy and diffuse confluent periventricular T2 hyperintensities have progressed since the prior MRI. Dilated perivascular spaces are present within the basal ganglia. Remote lacunar infarcts are present within the brainstem. A remote infarct is present in the posterior left occipital lobe. Remote lacunar infarct is present in the posterior left cerebellum. The internal auditory canals are within normal limits. Vascular: Abnormal signal is present in the right vertebral artery. Flow is present the left vertebral artery and basilar artery. Flow is present in the anterior circulation bilaterally. Skull and upper cervical spine: The craniocervical junction is normal. Upper cervical spine is within normal limits. Marrow signal is unremarkable. Sinuses/Orbits: The globes and orbits are within normal limits. Fluid is present in the mastoid  air cells, left greater than right. No obstructing nasopharyngeal lesion is present. The globes and orbits are within normal limits. Other: IMPRESSION: 1. No acute intracranial abnormality or significant interval change. 2. Advanced atrophy and diffuse confluent periventricular T2 hyperintensities have progressed since the prior MRI. This likely reflects the sequela of chronic microvascular ischemia. 3. Remote lacunar infarcts within the brainstem and posterior left cerebellum. 4. Remote infarct of the posterior left occipital lobe. 5. Abnormal signal in the right vertebral artery. This likely reflects slow flow or occlusion. Recommend CTA of the head and neck to further evaluate. 6. Fluid in the mastoid air cells, left greater than right. No obstructing nasopharyngeal  lesion is present. Electronically Signed   By: Marin Roberts M.D.   On: 07/08/2023 18:11   CT Head Wo Contrast Result Date: 07/05/2023 CLINICAL DATA:  Mental status change of unknown cause. Dementia. Hyponatremia. EXAM: CT HEAD WITHOUT CONTRAST TECHNIQUE: Contiguous axial images were obtained from the base of the skull through the vertex without intravenous contrast. RADIATION DOSE REDUCTION: This exam was performed according to the departmental dose-optimization program which includes automated exposure control, adjustment of the mA and/or kV according to patient size and/or use of iterative reconstruction technique. COMPARISON:  06/14/2023 FINDINGS: Brain: Generalized atrophy. No focal abnormality seen affecting the brainstem or cerebellum. Old left occipital stroke. Advanced chronic small-vessel ischemic changes throughout the hemispheric white matter. No sign of acute stroke, mass, hemorrhage, hydrocephalus or extra-axial collection. Vascular: There is atherosclerotic calcification of the major vessels at the base of the brain. Skull: Negative Sinuses/Orbits: Clear/normal Other: None IMPRESSION: No acute CT finding. Generalized atrophy.  Old left occipital stroke. Advanced chronic small-vessel ischemic changes of the hemispheric white matter. Electronically Signed   By: Paulina Fusi M.D.   On: 07/05/2023 15:18   DG Chest Port 1 View Result Date: 07/05/2023 CLINICAL DATA:  Altered mental status. EXAM: PORTABLE CHEST 1 VIEW COMPARISON:  Chest radiograph dated 06/14/2023. FINDINGS: No focal consolidation, pleural effusion or pneumothorax. The cardiac silhouette is within limits. Atherosclerotic calcification of the aorta. No acute osseous pathology. IMPRESSION: No active disease. Electronically Signed   By: Elgie Collard M.D.   On: 07/05/2023 12:52   DG HIP UNILAT WITH PELVIS 2-3 VIEWS RIGHT Result Date: 07/01/2023 XR of the Right hip and AP pelvis demonstrates a hip hemiarthroplasty in good position.  The hip remains reduced.  There is no evidence of implant subsidence.  No acute fractures are noted.  Impression: Right hip hemiarthroplasty in stable position, without evidence of migration or subsidence compared to prior XR   DG HIP UNILAT WITH PELVIS 2-3 VIEWS RIGHT Result Date: 06/15/2023 CLINICAL DATA:  Closed displaced fracture of right femoral neck, postop. EXAM: DG HIP (WITH OR WITHOUT PELVIS) 2-3V RIGHT COMPARISON:  Preoperative imaging. FINDINGS: Right hip arthroplasty in expected alignment. No periprosthetic lucency or fracture. Recent postsurgical change includes air and edema in the soft tissues. Lateral skin staples in place. IMPRESSION: Right hip arthroplasty without immediate postoperative complication. Electronically Signed   By: Narda Rutherford M.D.   On: 06/15/2023 15:17   CT Cervical Spine Wo Contrast Result Date: 06/14/2023 CLINICAL DATA:  Neck trauma (Age >= 65y) EXAM: CT CERVICAL SPINE WITHOUT CONTRAST TECHNIQUE: Multidetector CT imaging of the cervical spine was performed without intravenous contrast. Multiplanar CT image reconstructions were also generated. RADIATION DOSE REDUCTION: This exam was performed  according to the departmental dose-optimization program which includes automated exposure control, adjustment of the mA and/or kV according to patient size and/or use of iterative reconstruction technique. COMPARISON:  02/08/2023 FINDINGS: Alignment: Straightening of normal lordosis. Unchanged trace anterolisthesis of C3 on C4. No traumatic subluxation. Skull base and vertebrae: No acute fracture. Vertebral body heights are maintained. The dens and skull base are intact. Soft tissues and spinal canal: No prevertebral fluid or swelling. No visible canal hematoma. Disc levels: Stable degenerative disc disease, most prominent C4-C5 and C6-C7. Upper chest: Emphysema. Other: Carotid calcifications. IMPRESSION: 1. No acute fracture or subluxation of the cervical spine. 2. Stable degenerative disc disease. Electronically Signed   By: Narda Rutherford M.D.   On: 06/14/2023 19:36   CT Head Wo Contrast Result Date: 06/14/2023 CLINICAL  DATA:  Head trauma, intracranial arterial injury suspected EXAM: CT HEAD WITHOUT CONTRAST TECHNIQUE: Contiguous axial images were obtained from the base of the skull through the vertex without intravenous contrast. RADIATION DOSE REDUCTION: This exam was performed according to the departmental dose-optimization program which includes automated exposure control, adjustment of the mA and/or kV according to patient size and/or use of iterative reconstruction technique. COMPARISON:  02/08/2023 FINDINGS: Brain: No intracranial hemorrhage, mass effect, or midline shift. Stable degree of atrophy no hydrocephalus. The basilar cisterns are patent. Unchanged periventricular and deep chronic small vessel ischemia. No evidence of territorial infarct or acute ischemia. No extra-axial or intracranial fluid collection. Vascular: Atherosclerosis of skullbase vasculature without hyperdense vessel or abnormal calcification. Skull: No fracture or focal lesion. Sinuses/Orbits: Paranasal sinuses and mastoid air  cells are clear. The visualized orbits are unremarkable. Other: None. IMPRESSION: 1. No acute intracranial abnormality. No skull fracture. 2. Unchanged atrophy and chronic small vessel ischemia. Electronically Signed   By: Narda Rutherford M.D.   On: 06/14/2023 19:31   DG Chest Portable 1 View Result Date: 06/14/2023 CLINICAL DATA:  Hypoxia. EXAM: PORTABLE CHEST 1 VIEW COMPARISON:  10/08/2022 FINDINGS: Lung volumes are low. Stable heart size and mediastinal contours. Mild hypoventilatory changes at the lung bases. Bronchovascular crowding versus vascular congestion. No pneumothorax or large pleural effusion. On limited assessment, no acute osseous findings. IMPRESSION: Low lung volumes with hypoventilatory changes at the lung bases. Bronchovascular crowding versus vascular congestion. Electronically Signed   By: Narda Rutherford M.D.   On: 06/14/2023 17:38   DG HIP UNILAT WITH PELVIS 2-3 VIEWS LEFT Result Date: 06/14/2023 CLINICAL DATA:  Fall, does not want ambulate. EXAM: DG HIP (WITH OR WITHOUT PELVIS) 2-3V RIGHT; DG HIP (WITH OR WITHOUT PELVIS) 2-3V LEFT COMPARISON:  None Available. FINDINGS: Right hip: Displaced right femoral neck fracture. Proximal migration of the femoral shaft. The femoral head remains seated. Left hip: No acute fracture or dislocation. Mild hip joint space narrowing. No erosive change. Pelvis: No pelvic fracture. Pubic rami are intact. The pubic symphysis and sacroiliac joints are congruent. Mild overlying artifact from clothing. IMPRESSION: 1. Displaced right femoral neck fracture. 2. No acute fracture of the left hip or pelvis. Electronically Signed   By: Narda Rutherford M.D.   On: 06/14/2023 17:37   DG HIP UNILAT WITH PELVIS 2-3 VIEWS RIGHT Result Date: 06/14/2023 CLINICAL DATA:  Fall, does not want ambulate. EXAM: DG HIP (WITH OR WITHOUT PELVIS) 2-3V RIGHT; DG HIP (WITH OR WITHOUT PELVIS) 2-3V LEFT COMPARISON:  None Available. FINDINGS: Right hip: Displaced right femoral neck  fracture. Proximal migration of the femoral shaft. The femoral head remains seated. Left hip: No acute fracture or dislocation. Mild hip joint space narrowing. No erosive change. Pelvis: No pelvic fracture. Pubic rami are intact. The pubic symphysis and sacroiliac joints are congruent. Mild overlying artifact from clothing. IMPRESSION: 1. Displaced right femoral neck fracture. 2. No acute fracture of the left hip or pelvis. Electronically Signed   By: Narda Rutherford M.D.   On: 06/14/2023 17:37    Microbiology: Results for orders placed or performed during the hospital encounter of 06/14/23  Culture, blood (Routine X 2) w Reflex to ID Panel     Status: None   Collection Time: 06/14/23 10:17 PM   Specimen: Left Antecubital; Blood  Result Value Ref Range Status   Specimen Description   Final    LEFT ANTECUBITAL BOTTLES DRAWN AEROBIC AND ANAEROBIC   Special Requests   Final  Blood Culture results may not be optimal due to an inadequate volume of blood received in culture bottles   Culture   Final    NO GROWTH 5 DAYS Performed at [facility], [address], [place], [place] [address]    Report Status 06/19/2023 FINAL  Final  Culture, blood (Routine X 2) w Reflex to ID Panel     Status: None   Collection Time: 06/14/23 10:17 PM   Specimen: BLOOD LEFT WRIST  Result Value Ref Range Status   Specimen Description   Final    BLOOD LEFT WRIST BOTTLES DRAWN AEROBIC AND ANAEROBIC   Special Requests Blood Culture adequate volume  Final   Culture   Final    NO GROWTH 5 DAYS Performed at [facility], [address] [place], [place] [address]    Report Status 06/19/2023 FINAL  Final  Resp panel by RT-PCR (RSV, Flu A&B, Covid) Anterior Nasal Swab     Status: None   Collection Time: 06/14/23 10:20 PM   Specimen: Anterior Nasal Swab  Result Value Ref Range Status   SARS Coronavirus 2 by RT PCR NEGATIVE NEGATIVE Final    Comment: (NOTE) SARS-CoV-2 target nucleic acids are NOT DETECTED.  The  SARS-CoV-2 RNA is generally detectable in upper respiratory specimens during the acute phase of infection. The lowest concentration of SARS-CoV-2 viral copies this assay can detect is 138 copies/mL. A negative result does not preclude SARS-Cov-2 infection and should not be used as the sole basis for treatment or other patient management decisions. A negative result may occur with  improper specimen collection/handling, submission of specimen other than nasopharyngeal swab, presence of viral mutation(s) within the areas targeted by this assay, and inadequate number of viral copies(<138 copies/mL). A negative result must be combined with clinical observations, patient history, and epidemiological information. The expected result is Negative.  Fact Sheet for Patients:  BloggerCourse.com  Fact Sheet for Healthcare Providers:  [employer]  This test is no t yet approved or cleared by the [place] FDA and  has been authorized for detection and/or diagnosis of SARS-CoV-2 by FDA under an Emergency Use Authorization (EUA). This EUA will remain  in effect (meaning this test can be used) for the duration of the COVID-19 declaration under Section 564(b)(1) of the Act, 21 U.S.C.section 360bbb-3(b)(1), unless the authorization is terminated  or revoked sooner.       Influenza A by PCR NEGATIVE NEGATIVE Final   Influenza B by PCR NEGATIVE NEGATIVE Final    Comment: (NOTE) The Xpert Xpress SARS-CoV-2/FLU/RSV plus assay is intended as an aid in the diagnosis of influenza from Nasopharyngeal swab specimens and should not be used as a sole basis for treatment. Nasal washings and aspirates are unacceptable for Xpert Xpress SARS-CoV-2/FLU/RSV testing.  Fact Sheet for Patients: BloggerCourse.com  Fact Sheet for Healthcare Providers: [employer]ker[employer]  This test is not yet approved or  cleared by the [place] FDA and has been authorized for detection and/or diagnosis of SARS-CoV-2 by FDA under an Emergency Use Authorization (EUA). This EUA will remain in effect (meaning this test can be used) for the duration of the COVID-19 declaration under Section 564(b)(1) of the Act, 21 U.S.C. section 360bbb-3(b)(1), unless the authorization is terminated or revoked.     Resp Syncytial Virus by PCR NEGATIVE NEGATIVE Final    Comment: (NOTE) Fact Sheet for Patients: BloggerCourse.com  Fact Sheet for Healthcare Providers: [employer]ker[employer]  This test is not yet approved or cleared by the [place] and  has been authorized for detection and/or diagnosis of SARS-CoV-2 by FDA under an Emergency Use Authorization (EUA). This EUA will remain in effect (meaning this test can be used) for the duration of the COVID-19 declaration under Section 564(b)(1) of the Act, 21 U.S.C. section 360bbb-3(b)(1), unless the authorization is terminated or revoked.  Performed at [facility], [address], [place], [place] [address]   Surgical pcr screen     Status: None   Collection Time: 06/14/23 11:46 PM   Specimen: Nasal Mucosa; Nasal Swab  Result Value Ref Range Status   MRSA, PCR NEGATIVE NEGATIVE Final   Staphylococcus aureus NEGATIVE NEGATIVE Final    Comment: (NOTE) The Xpert SA Assay (FDA approved for NASAL specimens in patients 88 years of age and older), is one component of a comprehensive surveillance program. It is not intended to diagnose infection nor to guide or monitor treatment. Performed at [facility], [address] [place], [place] [address]     Labs: CBC: Recent Labs  Lab 07/05/23 1501 07/06/23 0442 07/07/23 0400 07/08/23 0450  WBC 7.5 7.0 6.7 6.9  NEUTROABS 5.5  --   --   --   HGB 9.5* 8.8* 7.1* 8.4*  HCT 33.2* 31.4* 23.7* 28.0*  MCV 92.0 93.7 88.8 87.2  PLT 201 206 208 158   Basic Metabolic  Panel: Recent Labs  Lab 07/05/23 1501 07/05/23 2025 07/06/23 0442 07/06/23 1606 07/07/23 0400 07/08/23 0450 07/09/23 0441  NA 150*   < > 149* 148* 140 141 138  K 3.7   < > 3.4* 4.0 2.9* 4.0 4.1  CL 119*   < > 120* 119* 113* 112* 105  CO2 23   < > 24 22 20* 20* 23  GLUCOSE 108*   < > 143* 85 128* 140* 107*  BUN 32*   < > 28* 23 20 16 14   CREATININE 1.47*   < > 1.51* 1.35* 1.26* 1.06* 1.07*  CALCIUM 9.0   < > 8.2* 8.2* 7.9* 8.3* 8.7*  MG 2.2  --   --   --  1.6* 1.9 1.7  PHOS  --   --   --   --   --   --  3.4   < > = values in this interval not displayed.   Liver Function Tests: Recent Labs  Lab 07/05/23 1501  AST 16  ALT 11  ALKPHOS 88  BILITOT 0.8  PROT 6.3*  ALBUMIN 3.1*   CBG: Recent Labs  Lab 07/05/23 2114 07/05/23 2359 07/06/23 0322 07/06/23 0532 07/06/23 0809  GLUCAP 92 130* 148* 133* 131*    Discharge time spent: greater than 30 minutes.  Signed: Kendell Bane, MD [facility] 07/10/2023

## 2023-07-25 ENCOUNTER — Other Ambulatory Visit: Payer: Self-pay

## 2023-07-25 ENCOUNTER — Encounter: Payer: Self-pay | Admitting: Orthopedic Surgery

## 2023-07-25 ENCOUNTER — Ambulatory Visit: Admitting: Orthopedic Surgery

## 2023-07-25 DIAGNOSIS — S72001D Fracture of unspecified part of neck of right femur, subsequent encounter for closed fracture with routine healing: Secondary | ICD-10-CM

## 2023-07-25 NOTE — Progress Notes (Signed)
 Orthopaedic Postop Note  Assessment: Paula Andrews is a 78 y.o. female s/p Right hip hemiarthroplasty  DOS: 06/15/2023  Plan: Patient is very sleeping, not alert in clinic today.  She does not appear to be in pain.  Surgical incision is healing.  She tolerates gentle range of motion of the right hip.  She does have some blistering and pressure sores on the right foot, which are being appropriately handled with local wound care.  She is also on appropriate soft foods.  Encourage weightbearing as tolerated in walking.  Follow-up in 6 weeks for repeat evaluation.  They will contact the clinic if there are any concerns before that time.   Follow-up: Return in about 6 weeks (around 09/05/2023). XR at next visit: AP pelvis and Right hip  Subjective:  Chief Complaint  Patient presents with   Routine Post Op    R hip DOS 06/15/23    History of Present Illness: Paula Andrews is a 78 y.o. female who presents following the above stated procedure.  She remains in a nursing facility.  She was readmitted prior to today's visit.  As she has been having some issues with dehydration.  She also remains somnolent.  Does not appear to be in pain.  She is not getting out of bed on a regular basis to ambulate.   Review of Systems: Unable to assess  Objective: There were no vitals taken for this visit.  Physical Exam:  Patient is demented.  She is seated in a wheelchair.  She is somnolent.  Responds to strong stimuli.   Surgical incision is healing well.  There is no tenderness.  She tolerates gentle range of motion of the right hip.  Her leg strength is stable without issues.  Dressing is soft boot on the right foot.  IMAGING: I personally ordered and reviewed the following images:  No new Imaging obtained today.   Tonita Frater, MD 07/25/2023 9:52 AM

## 2023-07-27 ENCOUNTER — Encounter: Admitting: Orthopedic Surgery

## 2023-09-02 DEATH — deceased

## 2023-09-05 ENCOUNTER — Encounter: Admitting: Orthopedic Surgery
